# Patient Record
Sex: Female | Born: 1960 | Race: White | Hispanic: No | Marital: Married | Smoking: Never smoker
Health system: Southern US, Community
[De-identification: ages and names within clinical notes are randomized; demographics above are authoritative.]

## PROBLEM LIST (undated history)

## (undated) DIAGNOSIS — G8929 Other chronic pain: Secondary | ICD-10-CM

## (undated) DIAGNOSIS — E7800 Pure hypercholesterolemia, unspecified: Secondary | ICD-10-CM

## (undated) DIAGNOSIS — K219 Gastro-esophageal reflux disease without esophagitis: Secondary | ICD-10-CM

## (undated) DIAGNOSIS — S82899A Other fracture of unspecified lower leg, initial encounter for closed fracture: Secondary | ICD-10-CM

## (undated) DIAGNOSIS — R51 Headache: Secondary | ICD-10-CM

## (undated) DIAGNOSIS — R1310 Dysphagia, unspecified: Secondary | ICD-10-CM

## (undated) DIAGNOSIS — I609 Nontraumatic subarachnoid hemorrhage, unspecified: Secondary | ICD-10-CM

## (undated) DIAGNOSIS — F39 Unspecified mood [affective] disorder: Secondary | ICD-10-CM

## (undated) DIAGNOSIS — F329 Major depressive disorder, single episode, unspecified: Secondary | ICD-10-CM

## (undated) DIAGNOSIS — F29 Unspecified psychosis not due to a substance or known physiological condition: Secondary | ICD-10-CM

## (undated) DIAGNOSIS — Z923 Personal history of irradiation: Secondary | ICD-10-CM

## (undated) DIAGNOSIS — G40909 Epilepsy, unspecified, not intractable, without status epilepticus: Secondary | ICD-10-CM

## (undated) DIAGNOSIS — R569 Unspecified convulsions: Secondary | ICD-10-CM

## (undated) DIAGNOSIS — C719 Malignant neoplasm of brain, unspecified: Secondary | ICD-10-CM

## (undated) DIAGNOSIS — M62838 Other muscle spasm: Secondary | ICD-10-CM

## (undated) DIAGNOSIS — F32A Depression, unspecified: Secondary | ICD-10-CM

## (undated) DIAGNOSIS — F419 Anxiety disorder, unspecified: Secondary | ICD-10-CM

## (undated) HISTORY — DX: Malignant neoplasm of brain, unspecified: C71.9

## (undated) HISTORY — DX: Other fracture of unspecified lower leg, initial encounter for closed fracture: S82.899A

## (undated) HISTORY — DX: Personal history of irradiation: Z92.3

## (undated) HISTORY — DX: Unspecified convulsions: R56.9

## (undated) HISTORY — DX: Anxiety disorder, unspecified: F41.9

## (undated) HISTORY — DX: Other muscle spasm: M62.838

## (undated) HISTORY — DX: Other chronic pain: G89.29

## (undated) HISTORY — DX: Headache: R51

## (undated) HISTORY — DX: Dysphagia, unspecified: R13.10

---

## 1998-04-04 ENCOUNTER — Other Ambulatory Visit: Admission: RE | Admit: 1998-04-04 | Discharge: 1998-04-04 | Payer: Self-pay | Admitting: Obstetrics and Gynecology

## 2000-01-30 ENCOUNTER — Encounter: Admission: RE | Admit: 2000-01-30 | Discharge: 2000-01-30 | Payer: Self-pay | Admitting: Obstetrics and Gynecology

## 2000-01-30 ENCOUNTER — Encounter: Payer: Self-pay | Admitting: Obstetrics and Gynecology

## 2000-02-11 ENCOUNTER — Other Ambulatory Visit: Admission: RE | Admit: 2000-02-11 | Discharge: 2000-02-11 | Payer: Self-pay | Admitting: Obstetrics and Gynecology

## 2001-06-29 ENCOUNTER — Other Ambulatory Visit: Admission: RE | Admit: 2001-06-29 | Discharge: 2001-06-29 | Payer: Self-pay | Admitting: Obstetrics and Gynecology

## 2002-08-03 ENCOUNTER — Observation Stay (HOSPITAL_COMMUNITY): Admission: EM | Admit: 2002-08-03 | Discharge: 2002-08-04 | Payer: Self-pay | Admitting: Emergency Medicine

## 2002-08-03 ENCOUNTER — Encounter: Payer: Self-pay | Admitting: Emergency Medicine

## 2002-08-12 ENCOUNTER — Encounter: Admission: RE | Admit: 2002-08-12 | Discharge: 2002-08-12 | Payer: Self-pay | Admitting: Obstetrics and Gynecology

## 2002-08-12 ENCOUNTER — Encounter: Payer: Self-pay | Admitting: Obstetrics and Gynecology

## 2009-04-27 ENCOUNTER — Encounter: Admission: RE | Admit: 2009-04-27 | Discharge: 2009-04-27 | Payer: Self-pay | Admitting: Obstetrics and Gynecology

## 2009-09-26 ENCOUNTER — Inpatient Hospital Stay (HOSPITAL_COMMUNITY): Admission: EM | Admit: 2009-09-26 | Discharge: 2009-09-28 | Payer: Self-pay | Admitting: Emergency Medicine

## 2010-06-02 DIAGNOSIS — S82899A Other fracture of unspecified lower leg, initial encounter for closed fracture: Secondary | ICD-10-CM

## 2010-06-02 HISTORY — PX: BRAIN SURGERY: SHX531

## 2010-06-02 HISTORY — DX: Other fracture of unspecified lower leg, initial encounter for closed fracture: S82.899A

## 2010-06-23 ENCOUNTER — Encounter: Payer: Self-pay | Admitting: Obstetrics and Gynecology

## 2010-07-05 ENCOUNTER — Inpatient Hospital Stay (HOSPITAL_COMMUNITY)
Admission: AD | Admit: 2010-07-05 | Discharge: 2010-07-07 | DRG: 760 | Disposition: A | Discharge status: Home Health | Payer: PRIVATE HEALTH INSURANCE | Source: Ambulatory Visit | Attending: Obstetrics and Gynecology | Admitting: Obstetrics and Gynecology

## 2010-07-05 DIAGNOSIS — N920 Excessive and frequent menstruation with regular cycle: Principal | ICD-10-CM | POA: Diagnosis present

## 2010-07-05 DIAGNOSIS — D61818 Other pancytopenia: Secondary | ICD-10-CM | POA: Diagnosis present

## 2010-07-05 DIAGNOSIS — D500 Iron deficiency anemia secondary to blood loss (chronic): Secondary | ICD-10-CM | POA: Diagnosis present

## 2010-07-05 DIAGNOSIS — R7309 Other abnormal glucose: Secondary | ICD-10-CM | POA: Diagnosis present

## 2010-07-05 DIAGNOSIS — F411 Generalized anxiety disorder: Secondary | ICD-10-CM | POA: Diagnosis present

## 2010-07-05 DIAGNOSIS — D251 Intramural leiomyoma of uterus: Secondary | ICD-10-CM | POA: Diagnosis present

## 2010-07-05 LAB — COMPREHENSIVE METABOLIC PANEL
ALT: 72 U/L — ABNORMAL HIGH (ref 0–35)
Alkaline Phosphatase: 86 U/L (ref 39–117)
BUN: 12 mg/dL (ref 6–23)
CO2: 28 mEq/L (ref 19–32)
Chloride: 102 mEq/L (ref 96–112)
GFR calc non Af Amer: 50 mL/min — ABNORMAL LOW (ref >60)
Glucose, Bld: 225 mg/dL — ABNORMAL HIGH (ref 70–99)
Potassium: 4.4 mEq/L (ref 3.5–5.1)
Sodium: 135 mEq/L (ref 135–145)
Total Bilirubin: 1.3 mg/dL — ABNORMAL HIGH (ref 0.3–1.2)
Total Protein: 5.6 g/dL — ABNORMAL LOW (ref 6.0–8.3)

## 2010-07-05 LAB — ABO/RH: ABO/RH(D): B POS

## 2010-07-05 LAB — HEMOGLOBIN AND HEMATOCRIT, BLOOD
HCT: 19.5 % — ABNORMAL LOW (ref 36.0–46.0)
Hemoglobin: 6.8 g/dL — CL (ref 12.0–15.0)

## 2010-07-06 ENCOUNTER — Inpatient Hospital Stay (HOSPITAL_COMMUNITY): Payer: PRIVATE HEALTH INSURANCE

## 2010-07-06 LAB — APTT: aPTT: 27 seconds (ref 24–37)

## 2010-07-06 LAB — CBC
Hemoglobin: 8.3 g/dL — ABNORMAL LOW (ref 12.0–15.0)
MCV: 92.5 fL (ref 78.0–100.0)
Platelets: 68 10*3/uL — ABNORMAL LOW (ref 150–400)
RBC: 2.66 MIL/uL — ABNORMAL LOW (ref 3.87–5.11)
WBC: 2.8 10*3/uL — ABNORMAL LOW (ref 4.0–10.5)

## 2010-07-06 LAB — HEMOGLOBIN A1C
Hgb A1c MFr Bld: 4.8 % (ref <5.7)
Mean Plasma Glucose: 91 mg/dL (ref <117)

## 2010-07-06 LAB — PROTIME-INR: Prothrombin Time: 13.5 seconds (ref 11.6–15.2)

## 2010-07-07 LAB — CROSSMATCH
ABO/RH(D): B POS
Unit division: 0

## 2010-07-07 LAB — COMPREHENSIVE METABOLIC PANEL
ALT: 57 U/L — ABNORMAL HIGH (ref 0–35)
AST: 136 U/L — ABNORMAL HIGH (ref 0–37)
Albumin: 2.7 g/dL — ABNORMAL LOW (ref 3.5–5.2)
Calcium: 9.7 mg/dL (ref 8.4–10.5)
GFR calc Af Amer: >60 mL/min (ref >60)
Sodium: 138 mEq/L (ref 135–145)
Total Protein: 5.2 g/dL — ABNORMAL LOW (ref 6.0–8.3)

## 2010-07-07 LAB — CBC
MCHC: 32.9 g/dL (ref 30.0–36.0)
Platelets: 82 10*3/uL — ABNORMAL LOW (ref 150–400)
RDW: 15.9 % — ABNORMAL HIGH (ref 11.5–15.5)

## 2010-07-07 LAB — DIFFERENTIAL
Basophils Absolute: 0 10*3/uL (ref 0.0–0.1)
Basophils Relative: 0 % (ref 0–1)
Eosinophils Relative: 5 % (ref 0–5)
Monocytes Absolute: 0.3 10*3/uL (ref 0.1–1.0)

## 2010-08-15 ENCOUNTER — Emergency Department (HOSPITAL_COMMUNITY): Payer: No Typology Code available for payment source

## 2010-08-15 ENCOUNTER — Inpatient Hospital Stay (HOSPITAL_COMMUNITY)
Admission: EM | Admit: 2010-08-15 | Discharge: 2010-09-04 | DRG: 025 | Disposition: A | Discharge status: Rehabilitation Facility | Payer: No Typology Code available for payment source | Attending: Surgery | Admitting: Surgery

## 2010-08-15 DIAGNOSIS — F3289 Other specified depressive episodes: Secondary | ICD-10-CM | POA: Diagnosis present

## 2010-08-15 DIAGNOSIS — D696 Thrombocytopenia, unspecified: Secondary | ICD-10-CM | POA: Diagnosis present

## 2010-08-15 DIAGNOSIS — S82843B Displaced bimalleolar fracture of unspecified lower leg, initial encounter for open fracture type I or II: Secondary | ICD-10-CM | POA: Diagnosis present

## 2010-08-15 DIAGNOSIS — N179 Acute kidney failure, unspecified: Secondary | ICD-10-CM | POA: Diagnosis not present

## 2010-08-15 DIAGNOSIS — C713 Malignant neoplasm of parietal lobe: Principal | ICD-10-CM | POA: Diagnosis present

## 2010-08-15 DIAGNOSIS — Y998 Other external cause status: Secondary | ICD-10-CM

## 2010-08-15 DIAGNOSIS — S5010XA Contusion of unspecified forearm, initial encounter: Secondary | ICD-10-CM | POA: Diagnosis present

## 2010-08-15 DIAGNOSIS — N920 Excessive and frequent menstruation with regular cycle: Secondary | ICD-10-CM | POA: Diagnosis present

## 2010-08-15 DIAGNOSIS — E871 Hypo-osmolality and hyponatremia: Secondary | ICD-10-CM | POA: Diagnosis not present

## 2010-08-15 DIAGNOSIS — R561 Post traumatic seizures: Secondary | ICD-10-CM | POA: Diagnosis present

## 2010-08-15 DIAGNOSIS — F329 Major depressive disorder, single episode, unspecified: Secondary | ICD-10-CM | POA: Diagnosis present

## 2010-08-15 DIAGNOSIS — M25469 Effusion, unspecified knee: Secondary | ICD-10-CM | POA: Diagnosis present

## 2010-08-15 DIAGNOSIS — J9600 Acute respiratory failure, unspecified whether with hypoxia or hypercapnia: Secondary | ICD-10-CM | POA: Diagnosis present

## 2010-08-15 DIAGNOSIS — D649 Anemia, unspecified: Secondary | ICD-10-CM | POA: Diagnosis present

## 2010-08-15 DIAGNOSIS — E8779 Other fluid overload: Secondary | ICD-10-CM | POA: Diagnosis not present

## 2010-08-15 DIAGNOSIS — G936 Cerebral edema: Secondary | ICD-10-CM | POA: Diagnosis present

## 2010-08-15 DIAGNOSIS — F411 Generalized anxiety disorder: Secondary | ICD-10-CM | POA: Diagnosis present

## 2010-08-15 DIAGNOSIS — K560 Paralytic ileus: Secondary | ICD-10-CM | POA: Diagnosis not present

## 2010-08-15 DIAGNOSIS — Y92410 Unspecified street and highway as the place of occurrence of the external cause: Secondary | ICD-10-CM

## 2010-08-15 DIAGNOSIS — N390 Urinary tract infection, site not specified: Secondary | ICD-10-CM | POA: Diagnosis not present

## 2010-08-15 LAB — POCT I-STAT 3, ART BLOOD GAS (G3+)
Acid-base deficit: 9 mmol/L — ABNORMAL HIGH (ref 0.0–2.0)
O2 Saturation: 99 %
TCO2: 20 mmol/L (ref 0–100)
pO2, Arterial: 138 mmHg — ABNORMAL HIGH (ref 80.0–100.0)

## 2010-08-15 LAB — COMPREHENSIVE METABOLIC PANEL
ALT: 18 U/L (ref 0–35)
Alkaline Phosphatase: 69 U/L (ref 39–117)
BUN: 6 mg/dL (ref 6–23)
CO2: 14 mEq/L — ABNORMAL LOW (ref 19–32)
GFR calc non Af Amer: >60 mL/min (ref >60)
Glucose, Bld: 86 mg/dL (ref 70–99)
Potassium: 3.6 mEq/L (ref 3.5–5.1)
Sodium: 130 mEq/L — ABNORMAL LOW (ref 135–145)
Total Bilirubin: 0.6 mg/dL (ref 0.3–1.2)
Total Protein: 5.7 g/dL — ABNORMAL LOW (ref 6.0–8.3)

## 2010-08-15 LAB — ABO/RH: ABO/RH(D): B POS

## 2010-08-15 LAB — LACTIC ACID, PLASMA: Lactic Acid, Venous: 9.1 mmol/L — ABNORMAL HIGH (ref 0.5–2.2)

## 2010-08-15 LAB — POCT I-STAT, CHEM 8
BUN: 7 mg/dL (ref 6–23)
Creatinine, Ser: 1.1 mg/dL (ref 0.4–1.2)
Glucose, Bld: 87 mg/dL (ref 70–99)
Hemoglobin: 9.5 g/dL — ABNORMAL LOW (ref 12.0–15.0)
TCO2: 15 mmol/L (ref 0–100)

## 2010-08-15 LAB — CBC
Hemoglobin: 9.4 g/dL — ABNORMAL LOW (ref 12.0–15.0)
MCH: 30.1 pg (ref 26.0–34.0)
MCHC: 33.1 g/dL (ref 30.0–36.0)
MCV: 91 fL (ref 78.0–100.0)
Platelets: 143 10*3/uL — ABNORMAL LOW (ref 150–400)

## 2010-08-15 LAB — PROTIME-INR: Prothrombin Time: 13.8 seconds (ref 11.6–15.2)

## 2010-08-15 MED ORDER — IOHEXOL 300 MG/ML  SOLN
100.0000 mL | Freq: Once | INTRAMUSCULAR | Status: AC | PRN
  Administered 2010-08-15: 100 mL via INTRAVENOUS

## 2010-08-16 ENCOUNTER — Inpatient Hospital Stay (HOSPITAL_COMMUNITY): Payer: No Typology Code available for payment source

## 2010-08-16 LAB — BLOOD GAS, ARTERIAL
Drawn by: 33247
MECHVT: 440 mL
O2 Saturation: 99.9 %
PEEP: 5 cmH2O
RATE: 16 resp/min
pO2, Arterial: 230 mmHg — ABNORMAL HIGH (ref 80.0–100.0)

## 2010-08-16 LAB — BASIC METABOLIC PANEL
CO2: 26 mEq/L (ref 19–32)
Calcium: 7.5 mg/dL — ABNORMAL LOW (ref 8.4–10.5)
Chloride: 105 mEq/L (ref 96–112)
Creatinine, Ser: 1.17 mg/dL (ref 0.4–1.2)
Glucose, Bld: 137 mg/dL — ABNORMAL HIGH (ref 70–99)
Sodium: 137 mEq/L (ref 135–145)

## 2010-08-16 LAB — CBC
HCT: 24.6 % — ABNORMAL LOW (ref 36.0–46.0)
Hemoglobin: 8.4 g/dL — ABNORMAL LOW (ref 12.0–15.0)
MCH: 31.1 pg (ref 26.0–34.0)
MCHC: 34.1 g/dL (ref 30.0–36.0)

## 2010-08-16 MED ORDER — GADOBENATE DIMEGLUMINE 529 MG/ML IV SOLN
20.0000 mL | Freq: Once | INTRAVENOUS | Status: AC
  Administered 2010-08-16: 20 mL via INTRAVENOUS

## 2010-08-17 LAB — BASIC METABOLIC PANEL
BUN: 13 mg/dL (ref 6–23)
Calcium: 6.5 mg/dL — ABNORMAL LOW (ref 8.4–10.5)
Creatinine, Ser: 1.77 mg/dL — ABNORMAL HIGH (ref 0.4–1.2)
GFR calc non Af Amer: 30 mL/min — ABNORMAL LOW (ref >60)
Glucose, Bld: 173 mg/dL — ABNORMAL HIGH (ref 70–99)

## 2010-08-17 LAB — DIFFERENTIAL
Basophils Absolute: 0 10*3/uL (ref 0.0–0.1)
Basophils Relative: 0 % (ref 0–1)
Eosinophils Absolute: 0.1 10*3/uL (ref 0.0–0.7)
Eosinophils Relative: 1 % (ref 0–5)
Monocytes Absolute: 0.5 10*3/uL (ref 0.1–1.0)
Neutro Abs: 3.6 10*3/uL (ref 1.7–7.7)

## 2010-08-17 LAB — CBC
Hemoglobin: 6.3 g/dL — CL (ref 12.0–15.0)
MCH: 30.4 pg (ref 26.0–34.0)
MCHC: 32.5 g/dL (ref 30.0–36.0)
Platelets: 76 10*3/uL — ABNORMAL LOW (ref 150–400)
RDW: 13.6 % (ref 11.5–15.5)

## 2010-08-17 LAB — URINALYSIS, ROUTINE W REFLEX MICROSCOPIC
Glucose, UA: NEGATIVE mg/dL
Ketones, ur: 15 mg/dL — AB
Nitrite: NEGATIVE
Specific Gravity, Urine: 1.026 (ref 1.005–1.030)
pH: 5.5 (ref 5.0–8.0)

## 2010-08-17 LAB — URINE MICROSCOPIC-ADD ON

## 2010-08-17 LAB — PREPARE RBC (CROSSMATCH)

## 2010-08-17 LAB — PHENYTOIN LEVEL, TOTAL: Phenytoin Lvl: 10.6 ug/mL (ref 10.0–20.0)

## 2010-08-17 LAB — CK: Total CK: 220 U/L — ABNORMAL HIGH (ref 7–177)

## 2010-08-18 ENCOUNTER — Inpatient Hospital Stay (HOSPITAL_COMMUNITY): Payer: No Typology Code available for payment source

## 2010-08-18 LAB — TYPE AND SCREEN
ABO/RH(D): B POS
Antibody Screen: NEGATIVE
Unit division: 0
Unit division: 0

## 2010-08-18 LAB — BASIC METABOLIC PANEL
BUN: 12 mg/dL (ref 6–23)
Chloride: 108 mEq/L (ref 96–112)
Creatinine, Ser: 1.43 mg/dL — ABNORMAL HIGH (ref 0.4–1.2)
GFR calc Af Amer: 47 mL/min — ABNORMAL LOW (ref >60)
GFR calc non Af Amer: 39 mL/min — ABNORMAL LOW (ref >60)

## 2010-08-18 LAB — CBC
MCH: 31 pg (ref 26.0–34.0)
MCV: 90.7 fL (ref 78.0–100.0)
Platelets: 78 10*3/uL — ABNORMAL LOW (ref 150–400)
RBC: 2.58 MIL/uL — ABNORMAL LOW (ref 3.87–5.11)
RDW: 14.6 % (ref 11.5–15.5)
WBC: 3.3 10*3/uL — ABNORMAL LOW (ref 4.0–10.5)

## 2010-08-19 LAB — BASIC METABOLIC PANEL
BUN: 10 mg/dL (ref 6–23)
Calcium: 6.7 mg/dL — ABNORMAL LOW (ref 8.4–10.5)
Chloride: 109 mEq/L (ref 96–112)
Creatinine, Ser: 1.18 mg/dL (ref 0.4–1.2)

## 2010-08-19 LAB — CBC
Platelets: 103 10*3/uL — ABNORMAL LOW (ref 150–400)
RBC: 2.61 MIL/uL — ABNORMAL LOW (ref 3.87–5.11)
RDW: 14.4 % (ref 11.5–15.5)
WBC: 5.3 10*3/uL (ref 4.0–10.5)

## 2010-08-20 ENCOUNTER — Inpatient Hospital Stay (HOSPITAL_COMMUNITY): Payer: No Typology Code available for payment source

## 2010-08-20 LAB — BASIC METABOLIC PANEL
BUN: 9 mg/dL (ref 6–23)
BUN: 11 mg/dL (ref 6–23)
CO2: 26 mEq/L (ref 19–32)
Chloride: 113 mEq/L — ABNORMAL HIGH (ref 96–112)
Chloride: 113 mEq/L — ABNORMAL HIGH (ref 96–112)
Creatinine, Ser: 1.35 mg/dL — ABNORMAL HIGH (ref 0.4–1.2)
Creatinine, Ser: 1.26 mg/dL — ABNORMAL HIGH (ref 0.4–1.2)
GFR calc Af Amer: 50 mL/min — ABNORMAL LOW (ref >60)
GFR calc Af Amer: 48 mL/min — ABNORMAL LOW (ref >60)
GFR calc Af Amer: 54 mL/min — ABNORMAL LOW (ref >60)
GFR calc non Af Amer: 41 mL/min — ABNORMAL LOW (ref >60)
Glucose, Bld: 91 mg/dL (ref 70–99)
Potassium: 3.1 mEq/L — ABNORMAL LOW (ref 3.5–5.1)
Potassium: 4.5 mEq/L (ref 3.5–5.1)

## 2010-08-20 LAB — POCT I-STAT, CHEM 8
Creatinine, Ser: 2.3 mg/dL — ABNORMAL HIGH (ref 0.4–1.2)
Glucose, Bld: 103 mg/dL — ABNORMAL HIGH (ref 70–99)
Hemoglobin: 6.1 g/dL — CL (ref 12.0–15.0)
Potassium: 3.8 mEq/L (ref 3.5–5.1)

## 2010-08-20 LAB — POCT PREGNANCY, URINE: Preg Test, Ur: NEGATIVE

## 2010-08-20 LAB — COMPREHENSIVE METABOLIC PANEL
ALT: 33 U/L (ref 0–35)
AST: 58 U/L — ABNORMAL HIGH (ref 0–37)
CO2: 25 mEq/L (ref 19–32)
Calcium: 8.7 mg/dL (ref 8.4–10.5)
Creatinine, Ser: 1.65 mg/dL — ABNORMAL HIGH (ref 0.4–1.2)
GFR calc Af Amer: 40 mL/min — ABNORMAL LOW (ref >60)
GFR calc non Af Amer: 33 mL/min — ABNORMAL LOW (ref >60)
Sodium: 135 mEq/L (ref 135–145)
Total Protein: 5 g/dL — ABNORMAL LOW (ref 6.0–8.3)

## 2010-08-20 LAB — CBC
HCT: 19 % — ABNORMAL LOW (ref 36.0–46.0)
HCT: 22.4 % — ABNORMAL LOW (ref 36.0–46.0)
Hemoglobin: 7.8 g/dL — ABNORMAL LOW (ref 12.0–15.0)
MCHC: 34.8 g/dL (ref 30.0–36.0)
MCHC: 35.4 g/dL (ref 30.0–36.0)
MCV: 90.6 fL (ref 78.0–100.0)
MCV: 91.7 fL (ref 78.0–100.0)
MCV: 92.7 fL (ref 78.0–100.0)
MCV: 92.8 fL (ref 78.0–100.0)
MCV: 94.1 fL (ref 78.0–100.0)
Platelets: 144 10*3/uL — ABNORMAL LOW (ref 150–400)
Platelets: 115 10*3/uL — ABNORMAL LOW (ref 150–400)
Platelets: 130 10*3/uL — ABNORMAL LOW (ref 150–400)
Platelets: 97 10*3/uL — ABNORMAL LOW (ref 150–400)
RBC: 2.42 MIL/uL — ABNORMAL LOW (ref 3.87–5.11)
RBC: 2.44 MIL/uL — ABNORMAL LOW (ref 3.87–5.11)
RBC: 2.46 MIL/uL — ABNORMAL LOW (ref 3.87–5.11)
RBC: 2.6 MIL/uL — ABNORMAL LOW (ref 3.87–5.11)
RDW: 14.5 % (ref 11.5–15.5)
RDW: 13.8 % (ref 11.5–15.5)
RDW: 14.4 % (ref 11.5–15.5)
WBC: 5.6 10*3/uL (ref 4.0–10.5)
WBC: 3.3 10*3/uL — ABNORMAL LOW (ref 4.0–10.5)
WBC: 3.4 10*3/uL — ABNORMAL LOW (ref 4.0–10.5)
WBC: 5.1 10*3/uL (ref 4.0–10.5)

## 2010-08-20 LAB — MAGNESIUM: Magnesium: 2.4 mg/dL (ref 1.5–2.5)

## 2010-08-20 LAB — URINE CULTURE: Colony Count: 50000

## 2010-08-20 LAB — DIFFERENTIAL
Eosinophils Absolute: 0.1 10*3/uL (ref 0.0–0.7)
Eosinophils Relative: 2 % (ref 0–5)
Lymphs Abs: 1.2 10*3/uL (ref 0.7–4.0)

## 2010-08-20 LAB — TYPE AND SCREEN: Antibody Screen: NEGATIVE

## 2010-08-20 LAB — SODIUM, URINE, RANDOM: Sodium, Ur: 19 mEq/L

## 2010-08-20 LAB — URINALYSIS, ROUTINE W REFLEX MICROSCOPIC
Glucose, UA: NEGATIVE mg/dL
Ketones, ur: NEGATIVE mg/dL
Leukocytes, UA: NEGATIVE
Protein, ur: NEGATIVE mg/dL
Urobilinogen, UA: 0.2 mg/dL (ref 0.0–1.0)

## 2010-08-20 LAB — URINE MICROSCOPIC-ADD ON

## 2010-08-20 LAB — ABO/RH: ABO/RH(D): B POS

## 2010-08-20 LAB — IRON AND TIBC: Iron: 51 ug/dL (ref 42–135)

## 2010-08-20 LAB — VITAMIN B12: Vitamin B-12: 573 pg/mL (ref 211–911)

## 2010-08-20 LAB — PROTIME-INR: Prothrombin Time: 13.8 seconds (ref 11.6–15.2)

## 2010-08-21 LAB — BASIC METABOLIC PANEL
Calcium: 7.5 mg/dL — ABNORMAL LOW (ref 8.4–10.5)
Creatinine, Ser: 1.38 mg/dL — ABNORMAL HIGH (ref 0.4–1.2)
GFR calc Af Amer: 49 mL/min — ABNORMAL LOW (ref >60)

## 2010-08-21 LAB — SURGICAL PCR SCREEN
MRSA, PCR: NEGATIVE
Staphylococcus aureus: NEGATIVE

## 2010-08-22 ENCOUNTER — Inpatient Hospital Stay (HOSPITAL_COMMUNITY): Payer: No Typology Code available for payment source

## 2010-08-22 HISTORY — PX: ORIF ANKLE FRACTURE: SUR919

## 2010-08-22 LAB — DIFFERENTIAL
Basophils Absolute: 0 10*3/uL (ref 0.0–0.1)
Basophils Relative: 1 % (ref 0–1)
Eosinophils Absolute: 0.1 10*3/uL (ref 0.0–0.7)
Eosinophils Relative: 3 % (ref 0–5)

## 2010-08-22 LAB — BASIC METABOLIC PANEL
BUN: 12 mg/dL (ref 6–23)
GFR calc non Af Amer: 49 mL/min — ABNORMAL LOW (ref >60)
Potassium: 3.5 mEq/L (ref 3.5–5.1)
Sodium: 145 mEq/L (ref 135–145)

## 2010-08-22 LAB — CBC
Platelets: 141 10*3/uL — ABNORMAL LOW (ref 150–400)
RDW: 14.9 % (ref 11.5–15.5)
WBC: 3.7 10*3/uL — ABNORMAL LOW (ref 4.0–10.5)

## 2010-08-22 LAB — PHENYTOIN LEVEL, TOTAL: Phenytoin Lvl: 10.7 ug/mL (ref 10.0–20.0)

## 2010-08-23 DIAGNOSIS — M79609 Pain in unspecified limb: Secondary | ICD-10-CM

## 2010-08-23 LAB — BASIC METABOLIC PANEL
Chloride: 116 mEq/L — ABNORMAL HIGH (ref 96–112)
Creatinine, Ser: 1.31 mg/dL — ABNORMAL HIGH (ref 0.4–1.2)
GFR calc Af Amer: 52 mL/min — ABNORMAL LOW (ref >60)
GFR calc non Af Amer: 43 mL/min — ABNORMAL LOW (ref >60)

## 2010-08-23 LAB — CBC
Hemoglobin: 7.3 g/dL — ABNORMAL LOW (ref 12.0–15.0)
MCHC: 33 g/dL (ref 30.0–36.0)
RBC: 2.4 MIL/uL — ABNORMAL LOW (ref 3.87–5.11)

## 2010-08-23 LAB — PHENYTOIN LEVEL, TOTAL: Phenytoin Lvl: 8.8 ug/mL — ABNORMAL LOW (ref 10.0–20.0)

## 2010-08-23 LAB — ALBUMIN: Albumin: 2.2 g/dL — ABNORMAL LOW (ref 3.5–5.2)
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
## 2010-08-24 LAB — CBC
HCT: 22.8 % — ABNORMAL LOW (ref 36.0–46.0)
MCH: 29.8 pg (ref 26.0–34.0)
MCHC: 32.5 g/dL (ref 30.0–36.0)
MCV: 91.9 fL (ref 78.0–100.0)
RDW: 14.7 % (ref 11.5–15.5)

## 2010-08-24 LAB — BASIC METABOLIC PANEL
BUN: 11 mg/dL (ref 6–23)
CO2: 17 mEq/L — ABNORMAL LOW (ref 19–32)
Calcium: 7.8 mg/dL — ABNORMAL LOW (ref 8.4–10.5)
GFR calc non Af Amer: 49 mL/min — ABNORMAL LOW (ref >60)
Glucose, Bld: 97 mg/dL (ref 70–99)

## 2010-08-26 ENCOUNTER — Inpatient Hospital Stay (HOSPITAL_COMMUNITY): Payer: No Typology Code available for payment source

## 2010-08-26 DIAGNOSIS — S82853A Displaced trimalleolar fracture of unspecified lower leg, initial encounter for closed fracture: Secondary | ICD-10-CM

## 2010-08-26 MED ORDER — GADOBENATE DIMEGLUMINE 529 MG/ML IV SOLN
15.0000 mL | Freq: Once | INTRAVENOUS | Status: AC
  Administered 2010-08-26: 15 mL via INTRAVENOUS
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
## 2010-08-27 ENCOUNTER — Inpatient Hospital Stay (HOSPITAL_COMMUNITY): Payer: No Typology Code available for payment source

## 2010-08-27 LAB — URINE MICROSCOPIC-ADD ON

## 2010-08-27 LAB — CROSSMATCH
ABO/RH(D): B POS
Unit division: 0

## 2010-08-27 LAB — URINALYSIS, ROUTINE W REFLEX MICROSCOPIC
Bilirubin Urine: NEGATIVE
Glucose, UA: NEGATIVE mg/dL
Ketones, ur: NEGATIVE mg/dL
Specific Gravity, Urine: 1.013 (ref 1.005–1.030)
pH: 5.5 (ref 5.0–8.0)

## 2010-08-27 MED ORDER — GADOBENATE DIMEGLUMINE 529 MG/ML IV SOLN
15.0000 mL | Freq: Once | INTRAVENOUS | Status: AC | PRN
  Administered 2010-08-27: 15 mL via INTRAVENOUS

## 2010-08-28 ENCOUNTER — Inpatient Hospital Stay (HOSPITAL_COMMUNITY): Payer: No Typology Code available for payment source

## 2010-08-28 ENCOUNTER — Other Ambulatory Visit: Payer: Self-pay | Admitting: Neurosurgery

## 2010-08-28 LAB — CBC
Platelets: 295 10*3/uL (ref 150–400)
RBC: 2.45 MIL/uL — ABNORMAL LOW (ref 3.87–5.11)
RDW: 14.4 % (ref 11.5–15.5)
WBC: 4.9 10*3/uL (ref 4.0–10.5)

## 2010-08-28 LAB — BASIC METABOLIC PANEL
GFR calc non Af Amer: >60 mL/min (ref >60)
Potassium: 3.6 mEq/L (ref 3.5–5.1)
Sodium: 136 mEq/L (ref 135–145)

## 2010-08-28 LAB — PROTIME-INR: Prothrombin Time: 13.6 seconds (ref 11.6–15.2)

## 2010-08-28 LAB — URINE CULTURE: Colony Count: 50000

## 2010-08-28 LAB — APTT: aPTT: 23 seconds — ABNORMAL LOW (ref 24–37)

## 2010-08-29 LAB — CROSSMATCH
ABO/RH(D): B POS
Antibody Screen: NEGATIVE
Unit division: 0

## 2010-08-29 LAB — COMPREHENSIVE METABOLIC PANEL
Albumin: 1.8 g/dL — ABNORMAL LOW (ref 3.5–5.2)
Alkaline Phosphatase: 102 U/L (ref 39–117)
BUN: 10 mg/dL (ref 6–23)
Chloride: 108 mEq/L (ref 96–112)
Potassium: 4.2 mEq/L (ref 3.5–5.1)
Total Bilirubin: 0.6 mg/dL (ref 0.3–1.2)

## 2010-08-29 LAB — CBC
HCT: 27.5 % — ABNORMAL LOW (ref 36.0–46.0)
MCV: 85.9 fL (ref 78.0–100.0)
Platelets: 361 10*3/uL (ref 150–400)
RBC: 3.2 MIL/uL — ABNORMAL LOW (ref 3.87–5.11)
WBC: 6.9 10*3/uL (ref 4.0–10.5)

## 2010-08-30 ENCOUNTER — Inpatient Hospital Stay (HOSPITAL_COMMUNITY): Payer: No Typology Code available for payment source

## 2010-08-30 LAB — CBC
MCH: 29 pg (ref 26.0–34.0)
MCHC: 33.3 g/dL (ref 30.0–36.0)
MCV: 86.9 fL (ref 78.0–100.0)
Platelets: 322 10*3/uL (ref 150–400)
RDW: 16.1 % — ABNORMAL HIGH (ref 11.5–15.5)
WBC: 9.7 10*3/uL (ref 4.0–10.5)

## 2010-08-30 LAB — URINALYSIS, MICROSCOPIC ONLY
Ketones, ur: NEGATIVE mg/dL
Nitrite: NEGATIVE
Protein, ur: 30 mg/dL — AB

## 2010-08-30 LAB — BASIC METABOLIC PANEL
BUN: 12 mg/dL (ref 6–23)
Calcium: 8 mg/dL — ABNORMAL LOW (ref 8.4–10.5)
Creatinine, Ser: 1.2 mg/dL (ref 0.4–1.2)
GFR calc non Af Amer: 47 mL/min — ABNORMAL LOW (ref >60)
Potassium: 4.2 mEq/L (ref 3.5–5.1)

## 2010-08-31 LAB — CBC
Hemoglobin: 9.1 g/dL — ABNORMAL LOW (ref 12.0–15.0)
MCH: 28.8 pg (ref 26.0–34.0)
MCV: 86.4 fL (ref 78.0–100.0)
RBC: 3.16 MIL/uL — ABNORMAL LOW (ref 3.87–5.11)
WBC: 8.1 10*3/uL (ref 4.0–10.5)

## 2010-08-31 LAB — COMPREHENSIVE METABOLIC PANEL
AST: 31 U/L (ref 0–37)
Alkaline Phosphatase: 100 U/L (ref 39–117)
BUN: 14 mg/dL (ref 6–23)
CO2: 22 mEq/L (ref 19–32)
Chloride: 105 mEq/L (ref 96–112)
Creatinine, Ser: 0.98 mg/dL (ref 0.4–1.2)
GFR calc Af Amer: >60 mL/min (ref >60)
GFR calc non Af Amer: 60 mL/min — ABNORMAL LOW (ref >60)
Potassium: 4.3 mEq/L (ref 3.5–5.1)
Total Bilirubin: 0.3 mg/dL (ref 0.3–1.2)

## 2010-08-31 LAB — URINE CULTURE
Colony Count: >=100000
Culture  Setup Time: 201203301115

## 2010-08-31 LAB — PHOSPHORUS: Phosphorus: 2.1 mg/dL — ABNORMAL LOW (ref 2.3–4.6)

## 2010-09-01 LAB — BASIC METABOLIC PANEL
CO2: 25 mEq/L (ref 19–32)
Calcium: 8.2 mg/dL — ABNORMAL LOW (ref 8.4–10.5)
Creatinine, Ser: 1 mg/dL (ref 0.4–1.2)
GFR calc Af Amer: >60 mL/min (ref >60)
GFR calc non Af Amer: 58 mL/min — ABNORMAL LOW (ref >60)
Sodium: 136 mEq/L (ref 135–145)

## 2010-09-01 LAB — MAGNESIUM: Magnesium: 1.2 mg/dL — ABNORMAL LOW (ref 1.5–2.5)

## 2010-09-01 LAB — PHOSPHORUS: Phosphorus: 2.1 mg/dL — ABNORMAL LOW (ref 2.3–4.6)

## 2010-09-02 LAB — BASIC METABOLIC PANEL
BUN: 16 mg/dL (ref 6–23)
Calcium: 8.3 mg/dL — ABNORMAL LOW (ref 8.4–10.5)
Creatinine, Ser: 0.92 mg/dL (ref 0.4–1.2)
GFR calc Af Amer: >60 mL/min (ref >60)
GFR calc non Af Amer: >60 mL/min (ref >60)
Glucose, Bld: 128 mg/dL — ABNORMAL HIGH (ref 70–99)
Potassium: 3.8 mEq/L (ref 3.5–5.1)
Sodium: 133 mEq/L — ABNORMAL LOW (ref 135–145)

## 2010-09-02 LAB — CBC
HCT: 32.6 % — ABNORMAL LOW (ref 36.0–46.0)
MCHC: 32.8 g/dL (ref 30.0–36.0)
Platelets: 440 10*3/uL — ABNORMAL HIGH (ref 150–400)
RDW: 15.2 % (ref 11.5–15.5)
WBC: 8 10*3/uL (ref 4.0–10.5)

## 2010-09-04 ENCOUNTER — Inpatient Hospital Stay (HOSPITAL_COMMUNITY)
Admission: RE | Admit: 2010-09-04 | Discharge: 2010-09-16 | DRG: 945 | Disposition: A | Discharge status: Home Health | Payer: PRIVATE HEALTH INSURANCE | Source: Other Acute Inpatient Hospital | Attending: Physical Medicine & Rehabilitation | Admitting: Physical Medicine & Rehabilitation

## 2010-09-04 DIAGNOSIS — Z87891 Personal history of nicotine dependence: Secondary | ICD-10-CM

## 2010-09-04 DIAGNOSIS — F411 Generalized anxiety disorder: Secondary | ICD-10-CM | POA: Diagnosis present

## 2010-09-04 DIAGNOSIS — D62 Acute posthemorrhagic anemia: Secondary | ICD-10-CM | POA: Diagnosis present

## 2010-09-04 DIAGNOSIS — S82899A Other fracture of unspecified lower leg, initial encounter for closed fracture: Secondary | ICD-10-CM | POA: Diagnosis present

## 2010-09-04 DIAGNOSIS — G40909 Epilepsy, unspecified, not intractable, without status epilepticus: Secondary | ICD-10-CM | POA: Diagnosis present

## 2010-09-04 DIAGNOSIS — Z5189 Encounter for other specified aftercare: Principal | ICD-10-CM

## 2010-09-04 DIAGNOSIS — E871 Hypo-osmolality and hyponatremia: Secondary | ICD-10-CM | POA: Diagnosis present

## 2010-09-04 DIAGNOSIS — C713 Malignant neoplasm of parietal lobe: Secondary | ICD-10-CM | POA: Diagnosis present

## 2010-09-04 DIAGNOSIS — C719 Malignant neoplasm of brain, unspecified: Secondary | ICD-10-CM

## 2010-09-04 DIAGNOSIS — S82843A Displaced bimalleolar fracture of unspecified lower leg, initial encounter for closed fracture: Secondary | ICD-10-CM

## 2010-09-05 LAB — CBC
MCH: 28.2 pg (ref 26.0–34.0)
MCV: 89.3 fL (ref 78.0–100.0)
Platelets: 302 10*3/uL (ref 150–400)
RDW: 15.2 % (ref 11.5–15.5)
WBC: 7 10*3/uL (ref 4.0–10.5)

## 2010-09-05 LAB — DIFFERENTIAL
Eosinophils Absolute: 0.1 10*3/uL (ref 0.0–0.7)
Eosinophils Relative: 2 % (ref 0–5)
Lymphs Abs: 1.1 10*3/uL (ref 0.7–4.0)

## 2010-09-05 LAB — COMPREHENSIVE METABOLIC PANEL
Albumin: 1.9 g/dL — ABNORMAL LOW (ref 3.5–5.2)
BUN: 20 mg/dL (ref 6–23)
Calcium: 8.5 mg/dL (ref 8.4–10.5)
Creatinine, Ser: 0.93 mg/dL (ref 0.4–1.2)
Potassium: 3.8 mEq/L (ref 3.5–5.1)
Total Protein: 4.5 g/dL — ABNORMAL LOW (ref 6.0–8.3)
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
## 2010-09-06 DIAGNOSIS — C719 Malignant neoplasm of brain, unspecified: Secondary | ICD-10-CM

## 2010-09-06 DIAGNOSIS — S82843A Displaced bimalleolar fracture of unspecified lower leg, initial encounter for closed fracture: Secondary | ICD-10-CM
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
## 2010-09-11 DIAGNOSIS — S82843A Displaced bimalleolar fracture of unspecified lower leg, initial encounter for closed fracture: Secondary | ICD-10-CM

## 2010-09-11 DIAGNOSIS — C719 Malignant neoplasm of brain, unspecified: Secondary | ICD-10-CM

## 2010-09-12 ENCOUNTER — Inpatient Hospital Stay (HOSPITAL_COMMUNITY): Payer: PRIVATE HEALTH INSURANCE

## 2010-09-12 DIAGNOSIS — C719 Malignant neoplasm of brain, unspecified: Secondary | ICD-10-CM

## 2010-09-12 DIAGNOSIS — F4322 Adjustment disorder with anxiety: Secondary | ICD-10-CM

## 2010-09-12 LAB — CBC
HCT: 31.2 % — ABNORMAL LOW (ref 36.0–46.0)
Hemoglobin: 9.9 g/dL — ABNORMAL LOW (ref 12.0–15.0)
MCV: 89.1 fL (ref 78.0–100.0)
RBC: 3.5 MIL/uL — ABNORMAL LOW (ref 3.87–5.11)
WBC: 5.1 10*3/uL (ref 4.0–10.5)

## 2010-09-12 LAB — BASIC METABOLIC PANEL
BUN: 22 mg/dL (ref 6–23)
Chloride: 100 mEq/L (ref 96–112)
Glucose, Bld: 90 mg/dL (ref 70–99)
Potassium: 3.9 mEq/L (ref 3.5–5.1)

## 2010-09-12 MED ORDER — GADOBENATE DIMEGLUMINE 529 MG/ML IV SOLN
20.0000 mL | Freq: Once | INTRAVENOUS | Status: AC
  Administered 2010-09-12: 20 mL via INTRAVENOUS
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
## 2010-09-13 DIAGNOSIS — C719 Malignant neoplasm of brain, unspecified: Secondary | ICD-10-CM

## 2010-09-13 DIAGNOSIS — S82843A Displaced bimalleolar fracture of unspecified lower leg, initial encounter for closed fracture: Secondary | ICD-10-CM

## 2010-09-16 ENCOUNTER — Ambulatory Visit: Payer: PRIVATE HEALTH INSURANCE | Attending: Radiation Oncology | Admitting: Radiation Oncology

## 2010-09-16 DIAGNOSIS — R51 Headache: Secondary | ICD-10-CM | POA: Insufficient documentation

## 2010-09-16 DIAGNOSIS — C713 Malignant neoplasm of parietal lobe: Secondary | ICD-10-CM | POA: Insufficient documentation

## 2010-09-16 DIAGNOSIS — Z510 Encounter for antineoplastic radiation therapy: Secondary | ICD-10-CM | POA: Insufficient documentation
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
## 2010-10-17 ENCOUNTER — Ambulatory Visit: Payer: PRIVATE HEALTH INSURANCE | Attending: Orthopedic Surgery

## 2010-10-17 DIAGNOSIS — M25673 Stiffness of unspecified ankle, not elsewhere classified: Secondary | ICD-10-CM | POA: Insufficient documentation

## 2010-10-17 DIAGNOSIS — IMO0001 Reserved for inherently not codable concepts without codable children: Secondary | ICD-10-CM | POA: Insufficient documentation

## 2010-10-17 DIAGNOSIS — M25579 Pain in unspecified ankle and joints of unspecified foot: Secondary | ICD-10-CM | POA: Insufficient documentation

## 2010-10-17 DIAGNOSIS — M6281 Muscle weakness (generalized): Secondary | ICD-10-CM | POA: Insufficient documentation

## 2010-10-17 DIAGNOSIS — M25676 Stiffness of unspecified foot, not elsewhere classified: Secondary | ICD-10-CM | POA: Insufficient documentation

## 2010-10-17 DIAGNOSIS — R269 Unspecified abnormalities of gait and mobility: Secondary | ICD-10-CM | POA: Insufficient documentation

## 2010-10-18 ENCOUNTER — Other Ambulatory Visit: Payer: Self-pay | Admitting: Oncology

## 2010-10-18 ENCOUNTER — Encounter: Payer: No Typology Code available for payment source | Admitting: Oncology

## 2010-10-18 ENCOUNTER — Encounter (HOSPITAL_BASED_OUTPATIENT_CLINIC_OR_DEPARTMENT_OTHER): Payer: PRIVATE HEALTH INSURANCE | Admitting: Oncology

## 2010-10-18 DIAGNOSIS — D496 Neoplasm of unspecified behavior of brain: Secondary | ICD-10-CM

## 2010-10-18 DIAGNOSIS — C713 Malignant neoplasm of parietal lobe: Secondary | ICD-10-CM

## 2010-10-18 LAB — CBC WITH DIFFERENTIAL/PLATELET
Eosinophils Absolute: 0.1 10*3/uL (ref 0.0–0.5)
HCT: 35.9 % (ref 34.8–46.6)
HGB: 12.1 g/dL (ref 11.6–15.9)
LYMPH%: 35.4 % (ref 14.0–49.7)
MONO#: 0.4 10*3/uL (ref 0.1–0.9)
NEUT#: 1.8 10*3/uL (ref 1.5–6.5)
Platelets: 148 10*3/uL (ref 145–400)
RBC: 4.21 10*6/uL (ref 3.70–5.45)
WBC: 3.6 10*3/uL — ABNORMAL LOW (ref 3.9–10.3)

## 2010-10-18 LAB — COMPREHENSIVE METABOLIC PANEL
Albumin: 3.4 g/dL — ABNORMAL LOW (ref 3.5–5.2)
CO2: 26 mEq/L (ref 19–32)
Glucose, Bld: 87 mg/dL (ref 70–99)
Sodium: 139 mEq/L (ref 135–145)
Total Bilirubin: 0.4 mg/dL (ref 0.3–1.2)
Total Protein: 5.7 g/dL — ABNORMAL LOW (ref 6.0–8.3)

## 2010-10-18 NOTE — Discharge Summary (Signed)
NAME:  [name], [name]                          ACCOUNT NO.:  [id number]   MEDICAL RECORD NO.:  [id number]                   PATIENT TYPE:  INP   LOCATION:  3715                                 FACILITY:  [facility]   PHYSICIAN:  Ermalene Searing. Leander Rams, M.D.                DATE OF BIRTH:  25-Apr-1961   DATE OF ADMISSION:  08/03/2002  DATE OF DISCHARGE:  08/04/2002                                 DISCHARGE SUMMARY   DISCHARGE MEDICATION:  Ativan 0.5 to 1 mg p.o. q.8h. p.r.n.   ALLERGIES:  NKDA.   PROCEDURES:  None.   HISTORY OF PRESENT ILLNESS:  The patient was in the usual state of health  when she began to have left-sided chest pain approximately four days prior  to presentation and denies shortness of breath, nausea or diaphoresis.  The  pain is worse with activity, better with rest.  She had similar symptoms  approximately six months ago associated with anxiety.  She saw primary MD  today who sent her to ER for further work-up.  In the ER, the patient's  blood pressure was found to be elevated 159/101 with a pulse of 89.  Her  first set of cardiac enzymes was negative and her ECG revealed normal sinus  rhythm with a right-sided bundle branch block.  She was admitted to rule out  MI.   HOSPITAL COURSE:  The patient was admitted to telemetry unit for monitoring.  She stayed in normal sinus rhythm with right bundle branch block throughout  her stay.  She did not have any significant ectopy nor ST wave changes.  Two  sets of cardiac enzymes were completed and found to be negative.  The  patient's blood pressure improved overnight and at the time of discharge,  blood pressure is 142/82, heart rate is 68, respirations 15.  Room air  saturations are 98%.  She is free of chest pain or shortness of breath.  It  is thought at this time that her chest pain was secondary to anxiety.  Hence, she is discharged on Ativan as noted above.  Followup is as noted  below.   DISCHARGE LABORATORY DATA:  Sodium  138, potassium 4.1, BUN 21, creatinine  0.9, serum glucose 96.  White blood cell count 3.5, hemoglobin 11.6,  hematocrit 33.1, platelet count 167.000.   CONSULTATIONS:  None.   CONDITION ON DISCHARGE:  Good.   DISPOSITION:  Discharged to home.    FOLLOWUP:  Followup is with [facility] for outpatient treadmill  Cardiolite on Thursday, August 11, 2002, at 12 noon.  She is to follow up  with her primary MD for any other concerns that she may have.      Ellender Hose. Sharol Given. Leander Rams, M.D.    SMD/MEDQ  D:  08/04/2002  T:  08/04/2002  Job:  [id number]   cc:   [facility]   Vikki Ports, M.D.  [address]  [employer]  [place] [address]  Fax: [phone]

## 2010-10-21 ENCOUNTER — Ambulatory Visit: Payer: PRIVATE HEALTH INSURANCE | Admitting: Physical Therapy

## 2010-10-23 ENCOUNTER — Ambulatory Visit: Payer: PRIVATE HEALTH INSURANCE | Admitting: Physical Therapy

## 2010-10-29 ENCOUNTER — Encounter
Payer: PRIVATE HEALTH INSURANCE | Attending: Physical Medicine & Rehabilitation | Admitting: Physical Medicine & Rehabilitation

## 2010-10-29 DIAGNOSIS — M25579 Pain in unspecified ankle and joints of unspecified foot: Secondary | ICD-10-CM | POA: Insufficient documentation

## 2010-10-29 DIAGNOSIS — C719 Malignant neoplasm of brain, unspecified: Secondary | ICD-10-CM

## 2010-10-29 DIAGNOSIS — R110 Nausea: Secondary | ICD-10-CM | POA: Insufficient documentation

## 2010-10-29 DIAGNOSIS — R509 Fever, unspecified: Secondary | ICD-10-CM | POA: Insufficient documentation

## 2010-10-29 DIAGNOSIS — F32A Depression, unspecified: Secondary | ICD-10-CM

## 2010-10-29 DIAGNOSIS — R413 Other amnesia: Secondary | ICD-10-CM | POA: Insufficient documentation

## 2010-10-29 DIAGNOSIS — S82843A Displaced bimalleolar fracture of unspecified lower leg, initial encounter for closed fracture: Secondary | ICD-10-CM

## 2010-10-29 DIAGNOSIS — R262 Difficulty in walking, not elsewhere classified: Secondary | ICD-10-CM | POA: Insufficient documentation

## 2010-10-29 DIAGNOSIS — C711 Malignant neoplasm of frontal lobe: Secondary | ICD-10-CM | POA: Insufficient documentation

## 2010-10-29 DIAGNOSIS — F323 Major depressive disorder, single episode, severe with psychotic features: Secondary | ICD-10-CM

## 2010-10-29 DIAGNOSIS — X58XXXA Exposure to other specified factors, initial encounter: Secondary | ICD-10-CM | POA: Insufficient documentation

## 2010-10-29 NOTE — Assessment & Plan Note (Signed)
HISTORY:  [name] is back regarding her bilateral ankle fracture dislocation.  She was also diagnosed with a right frontal astrocytoma. She was discharged from rehab on April 16 and progressing nicely.  She is now in the middle of radiation and therapies intentions of next week. She is to have a MRI of the brain scheduled in July.  She reports some increased confusion and memory issues as well as language problems and hair loss related to the therapy.  She has an outpatient PT working on her ankle, strength and range of motion still has some pain with weightbearing and at the end of day which may get up to 7-8/10.  She uses Ultram and oxycodone for breakthrough pain at this point.  REVIEW OF SYSTEMS:  Notable for nausea, fever, trouble walking.  Full 12- point review is in the written health and history section of the chart.  SOCIAL HISTORY:  Unchanged.  She is married, living with her husband. She is trying to apply for disability and spoke to her social worker recently about that.  PHYSICAL EXAMINATION:  VITAL SIGNS:  Blood pressure is 126/74, pulse 73, respiratory rate 18, and she is satting 100% on room air. GENERAL:  The patient is pleasant, alert and oriented x3.  She occasionally has some word-finding deficits.  She does keep notes and is able to use the organizer.  She has some problems with memory on occasion, but for most part quite confident. MUSCULOSKELETAL:  Right ankle remains tender along the distal fibula and tibia.  She has full passive range of motion and really actively can move in all planes of those bit weak in the 4/5 range.  Wounds are all healed essentially at this point.  Balance is fair with the walking boot.  She uses a walker for support.  Strength otherwise in the upper extremities is 5/5.  Sensory exam is grossly intact.  ASSESSMENT: 1. Right bimalleolar ankle fracture, status post open reduction     internal fixation. 2. Right frontal  astrocytoma.  PLAN: 1. Continue with outpatient PT to increase strength and improve     weightbearing tolerance and then improve range of motion and     proprioception at the ankle. 2. We will check liver function tests and Dilantin level today as I     cannot find that these were done recently.  We checked on the     computer system at [facility]. 3. Continue on Oncology radiation plan per Dr. Kathrynn [name] and Dr.     Darnelle [name].  Apparently the patient sees Dr. Kayleen [name], next month at     [facility] for second opinion. 4. The patient will use her oxycodone or tramadol for severe pain.  We     talked about desensitization exercises, massaging, etc., which will     be useful for the right ankle pain. 5. I will see her back here in about two months' time to follow up.  I     will speak to social worker about her disability claim which I     think is appropriate in this case.     [name], M.D. Electronically Signed    ZTS/MedQ D:  10/29/2010 09:58:00  T:  10/29/2010 22:46:45  Job #:  [id number]  cc:   [name]. [name], M.D. Fax: [phone]  [name], M.D. Fax: [phone]

## 2010-11-01 ENCOUNTER — Encounter: Payer: Self-pay | Admitting: Physical Therapy

## 2010-12-12 ENCOUNTER — Ambulatory Visit
Admission: RE | Admit: 2010-12-12 | Discharge: 2010-12-12 | Disposition: A | Discharge status: Home / Self Care | Payer: PRIVATE HEALTH INSURANCE | Source: Ambulatory Visit | Attending: Radiation Oncology | Admitting: Radiation Oncology

## 2010-12-17 ENCOUNTER — Ambulatory Visit (HOSPITAL_COMMUNITY)
Admission: RE | Admit: 2010-12-17 | Discharge: 2010-12-17 | Disposition: A | Discharge status: Home / Self Care | Payer: PRIVATE HEALTH INSURANCE | Source: Ambulatory Visit | Attending: Oncology | Admitting: Oncology

## 2010-12-17 DIAGNOSIS — C713 Malignant neoplasm of parietal lobe: Secondary | ICD-10-CM | POA: Insufficient documentation

## 2010-12-17 DIAGNOSIS — F29 Unspecified psychosis not due to a substance or known physiological condition: Secondary | ICD-10-CM | POA: Insufficient documentation

## 2010-12-17 DIAGNOSIS — R110 Nausea: Secondary | ICD-10-CM | POA: Insufficient documentation

## 2010-12-17 DIAGNOSIS — D496 Neoplasm of unspecified behavior of brain: Secondary | ICD-10-CM

## 2010-12-17 DIAGNOSIS — G319 Degenerative disease of nervous system, unspecified: Secondary | ICD-10-CM | POA: Insufficient documentation

## 2010-12-17 MED ORDER — GADOBENATE DIMEGLUMINE 529 MG/ML IV SOLN
15.0000 mL | Freq: Once | INTRAVENOUS | Status: AC | PRN
  Administered 2010-12-17: 15 mL via INTRAVENOUS

## 2010-12-24 ENCOUNTER — Encounter
Payer: PRIVATE HEALTH INSURANCE | Attending: Physical Medicine & Rehabilitation | Admitting: Physical Medicine & Rehabilitation

## 2010-12-24 ENCOUNTER — Encounter (HOSPITAL_BASED_OUTPATIENT_CLINIC_OR_DEPARTMENT_OTHER): Payer: PRIVATE HEALTH INSURANCE | Admitting: Oncology

## 2010-12-24 ENCOUNTER — Other Ambulatory Visit: Payer: Self-pay | Admitting: Oncology

## 2010-12-24 DIAGNOSIS — S82843A Displaced bimalleolar fracture of unspecified lower leg, initial encounter for closed fracture: Secondary | ICD-10-CM

## 2010-12-24 DIAGNOSIS — M25579 Pain in unspecified ankle and joints of unspecified foot: Secondary | ICD-10-CM | POA: Insufficient documentation

## 2010-12-24 DIAGNOSIS — C713 Malignant neoplasm of parietal lobe: Secondary | ICD-10-CM

## 2010-12-24 DIAGNOSIS — C719 Malignant neoplasm of brain, unspecified: Secondary | ICD-10-CM

## 2010-12-24 DIAGNOSIS — Z923 Personal history of irradiation: Secondary | ICD-10-CM | POA: Insufficient documentation

## 2010-12-24 DIAGNOSIS — F32A Depression, unspecified: Secondary | ICD-10-CM

## 2010-12-24 DIAGNOSIS — C711 Malignant neoplasm of frontal lobe: Secondary | ICD-10-CM | POA: Insufficient documentation

## 2010-12-24 DIAGNOSIS — F323 Major depressive disorder, single episode, severe with psychotic features: Secondary | ICD-10-CM

## 2010-12-24 LAB — CBC WITH DIFFERENTIAL/PLATELET
Eosinophils Absolute: 0 10*3/uL (ref 0.0–0.5)
HCT: 34 % — ABNORMAL LOW (ref 34.8–46.6)
LYMPH%: 23 % (ref 14.0–49.7)
MCHC: 35.4 g/dL (ref 31.5–36.0)
MCV: 92.1 fL (ref 79.5–101.0)
MONO#: 0.1 10*3/uL (ref 0.1–0.9)
MONO%: 6.7 % (ref 0.0–14.0)
NEUT%: 68.7 % (ref 38.4–76.8)
Platelets: 126 10*3/uL — ABNORMAL LOW (ref 145–400)
RBC: 3.69 10*6/uL — ABNORMAL LOW (ref 3.70–5.45)

## 2010-12-24 NOTE — Assessment & Plan Note (Signed)
HISTORY:  [name] is back regarding her multiple issues.  She had MRI done of her brain last week and it shows improvement in the tumor area as well as some atrophy.  The patient finished radiation therapies and sought a second opinion at [facility] per Dr. Kayleen Memos who agree with Dr. Darrall Dears plan.  She waits beginning of chemotherapy which last about 3 months apparently.  Her pain is about 5/10.  She has missed her pain at the right ankle, has bothersome when she bends, goes up steps.  As well also become painful when she is on for long periods of time.  Generally, she uses ACE wrap for support.  Sleep is fair.  She does use some rest as well as heat and ice for ankle discomfort as well as some tramadol.  REVIEW OF SYSTEMS:  Notable for the above.  She does have occasional dry cough, clear source.  SOCIAL HISTORY:  The patient is married.  Husband supportive.  No other changes noted.  PHYSICAL EXAMINATION:  VITAL SIGNS:  Blood pressure 120/66, pulse 95, respiratory 16, and she is satting 95% on room air. GENERAL:  The patient is pleasant, alert, oriented x3.  She uses a list and notes to organize her thoughts, does pretty well.  She still has some word-finding issues at time and issues with memory but generally she is a quite well with her organization and remembered all of her questions apparently today. MUSCULOSKELETAL:  She has pain in the right ankle more so with ankle dorsiflexion as well as inversion.  The areas remain swollen and 1+ around the injury site.  Postoperative scarring noted.  She does have 5/5 strength in the area and no gross sensory findings were noted.  She is wearing sandals and back today for walking and occasionally had a misstep or 2, but balance seemed to be intact and she had no major risk for following.  ASSESSMENT: 1. Right bimalleolar ankle fracture, status post open reduction and     internal fixation. 2. Right frontal  astrocytoma/oligodendroglioma.  The patient seems to     be responding to treatment.  PLAN: 1. Refill the patient's folic acid and iron today.  Recommend checking     iron studies as well as folic acid, B12 with her oncology labs. 2. Refill tramadol for breakthrough pain.  This seems to be working     well for her. 3. Regarding her anxiety, we will continue with Klonopin 0.5 t.i.d.     This works well.  She seems rather grounded today.  Cognition was     appropriate as well as her arousal. 4. Recommended right ankle supramalleolar orthosis for support of the     ankle with gait and for days when she is more active.  It should be     easy to find. 5. I will see her back here in about 4-6 months.  She will call me if     any problems or questions.     Ranelle Oyster, M.D. Electronically Signed    ZTS/MedQ D:  12/24/2010 12:14:57  T:  12/24/2010 15:29:03  Job #:  [id number]  cc:   Lowella Dell, M.D. Fax: [phone]

## 2011-01-13 ENCOUNTER — Other Ambulatory Visit: Payer: Self-pay | Admitting: Oncology

## 2011-01-13 ENCOUNTER — Encounter (HOSPITAL_BASED_OUTPATIENT_CLINIC_OR_DEPARTMENT_OTHER): Payer: PRIVATE HEALTH INSURANCE | Admitting: Oncology

## 2011-01-13 DIAGNOSIS — C713 Malignant neoplasm of parietal lobe: Secondary | ICD-10-CM

## 2011-01-13 LAB — CBC WITH DIFFERENTIAL/PLATELET
BASO%: 0.4 % (ref 0.0–2.0)
EOS%: 2.2 % (ref 0.0–7.0)
HCT: 37.4 % (ref 34.8–46.6)
LYMPH%: 38 % (ref 14.0–49.7)
MCH: 31.3 pg (ref 25.1–34.0)
MCHC: 35 g/dL (ref 31.5–36.0)
MONO#: 0.3 10*3/uL (ref 0.1–0.9)
MONO%: 11.3 % (ref 0.0–14.0)
NEUT%: 48.1 % (ref 38.4–76.8)
Platelets: 141 10*3/uL — ABNORMAL LOW (ref 145–400)
RBC: 4.18 10*6/uL (ref 3.70–5.45)
WBC: 2.7 10*3/uL — ABNORMAL LOW (ref 3.9–10.3)
nRBC: 0 % (ref 0–0)

## 2011-02-17 ENCOUNTER — Other Ambulatory Visit: Payer: Self-pay | Admitting: Oncology

## 2011-02-17 ENCOUNTER — Encounter (HOSPITAL_BASED_OUTPATIENT_CLINIC_OR_DEPARTMENT_OTHER): Payer: PRIVATE HEALTH INSURANCE | Admitting: Oncology

## 2011-02-17 DIAGNOSIS — C713 Malignant neoplasm of parietal lobe: Secondary | ICD-10-CM

## 2011-02-17 DIAGNOSIS — Z9181 History of falling: Secondary | ICD-10-CM

## 2011-02-17 LAB — CBC WITH DIFFERENTIAL/PLATELET
Basophils Absolute: 0 10*3/uL (ref 0.0–0.1)
EOS%: 3.8 % (ref 0.0–7.0)
HGB: 13 g/dL (ref 11.6–15.9)
MCH: 32.3 pg (ref 25.1–34.0)
MONO#: 0.2 10*3/uL (ref 0.1–0.9)
NEUT#: 1.3 10*3/uL — ABNORMAL LOW (ref 1.5–6.5)
RDW: 13.1 % (ref 11.2–14.5)
WBC: 2.3 10*3/uL — ABNORMAL LOW (ref 3.9–10.3)
lymph#: 0.6 10*3/uL — ABNORMAL LOW (ref 0.9–3.3)

## 2011-02-17 LAB — COMPREHENSIVE METABOLIC PANEL
ALT: 16 U/L (ref 0–35)
AST: 28 U/L (ref 0–37)
Albumin: 3.7 g/dL (ref 3.5–5.2)
BUN: 12 mg/dL (ref 6–23)
CO2: 24 mEq/L (ref 19–32)
Calcium: 10.4 mg/dL (ref 8.4–10.5)
Chloride: 103 mEq/L (ref 96–112)
Potassium: 4.4 mEq/L (ref 3.5–5.3)

## 2011-02-27 ENCOUNTER — Encounter (HOSPITAL_BASED_OUTPATIENT_CLINIC_OR_DEPARTMENT_OTHER): Payer: PRIVATE HEALTH INSURANCE | Admitting: Oncology

## 2011-02-27 ENCOUNTER — Other Ambulatory Visit: Payer: Self-pay | Admitting: Oncology

## 2011-02-27 DIAGNOSIS — C713 Malignant neoplasm of parietal lobe: Secondary | ICD-10-CM

## 2011-02-27 LAB — CBC WITH DIFFERENTIAL/PLATELET
BASO%: 0.5 % (ref 0.0–2.0)
EOS%: 5.2 % (ref 0.0–7.0)
HGB: 12.3 g/dL (ref 11.6–15.9)
MCH: 32.4 pg (ref 25.1–34.0)
MCHC: 35.2 g/dL (ref 31.5–36.0)
RDW: 13.4 % (ref 11.2–14.5)
lymph#: 1 10*3/uL (ref 0.9–3.3)

## 2011-02-27 LAB — COMPREHENSIVE METABOLIC PANEL
ALT: 12 U/L (ref 0–35)
AST: 21 U/L (ref 0–37)
Albumin: 2.9 g/dL — ABNORMAL LOW (ref 3.5–5.2)
Calcium: 10.1 mg/dL (ref 8.4–10.5)
Chloride: 104 mEq/L (ref 96–112)
Potassium: 3.3 mEq/L — ABNORMAL LOW (ref 3.5–5.3)
Total Protein: 6.1 g/dL (ref 6.0–8.3)

## 2011-03-10 ENCOUNTER — Other Ambulatory Visit: Payer: Self-pay | Admitting: Oncology

## 2011-03-10 ENCOUNTER — Encounter (HOSPITAL_BASED_OUTPATIENT_CLINIC_OR_DEPARTMENT_OTHER): Payer: PRIVATE HEALTH INSURANCE | Admitting: Oncology

## 2011-03-10 DIAGNOSIS — C713 Malignant neoplasm of parietal lobe: Secondary | ICD-10-CM

## 2011-03-10 LAB — CBC WITH DIFFERENTIAL/PLATELET
BASO%: 0.6 % (ref 0.0–2.0)
EOS%: 2.3 % (ref 0.0–7.0)
MCH: 32.1 pg (ref 25.1–34.0)
MCHC: 35.1 g/dL (ref 31.5–36.0)
NEUT%: 61.6 % (ref 38.4–76.8)
RDW: 13.2 % (ref 11.2–14.5)
lymph#: 0.7 10*3/uL — ABNORMAL LOW (ref 0.9–3.3)

## 2011-03-11 LAB — COMPREHENSIVE METABOLIC PANEL
ALT: 15 U/L (ref 0–35)
AST: 31 U/L (ref 0–37)
Albumin: 4 g/dL (ref 3.5–5.2)
Calcium: 10.3 mg/dL (ref 8.4–10.5)
Chloride: 106 mEq/L (ref 96–112)
Potassium: 4.4 mEq/L (ref 3.5–5.3)
Sodium: 142 mEq/L (ref 135–145)
Total Protein: 6.7 g/dL (ref 6.0–8.3)

## 2011-03-13 ENCOUNTER — Ambulatory Visit
Admission: RE | Admit: 2011-03-13 | Discharge: 2011-03-13 | Disposition: A | Discharge status: Home / Self Care | Payer: PRIVATE HEALTH INSURANCE | Source: Ambulatory Visit | Attending: Radiation Oncology | Admitting: Radiation Oncology

## 2011-03-17 ENCOUNTER — Encounter (HOSPITAL_BASED_OUTPATIENT_CLINIC_OR_DEPARTMENT_OTHER): Payer: PRIVATE HEALTH INSURANCE | Admitting: Oncology

## 2011-03-17 ENCOUNTER — Other Ambulatory Visit: Payer: Self-pay | Admitting: Oncology

## 2011-03-17 DIAGNOSIS — C713 Malignant neoplasm of parietal lobe: Secondary | ICD-10-CM

## 2011-03-17 LAB — CBC WITH DIFFERENTIAL/PLATELET
EOS%: 5.9 % (ref 0.0–7.0)
LYMPH%: 25.5 % (ref 14.0–49.7)
MCH: 32.1 pg (ref 25.1–34.0)
MCV: 91.8 fL (ref 79.5–101.0)
MONO%: 9.3 % (ref 0.0–14.0)
Platelets: 152 10*3/uL (ref 145–400)
RBC: 3.98 10*6/uL (ref 3.70–5.45)
RDW: 13.3 % (ref 11.2–14.5)

## 2011-03-17 LAB — COMPREHENSIVE METABOLIC PANEL
AST: 44 U/L — ABNORMAL HIGH (ref 0–37)
Albumin: 3.2 g/dL — ABNORMAL LOW (ref 3.5–5.2)
Alkaline Phosphatase: 141 U/L — ABNORMAL HIGH (ref 39–117)
BUN: 15 mg/dL (ref 6–23)
Potassium: 3.8 mEq/L (ref 3.5–5.3)
Sodium: 136 mEq/L (ref 135–145)
Total Bilirubin: 0.7 mg/dL (ref 0.3–1.2)

## 2011-03-18 ENCOUNTER — Other Ambulatory Visit: Payer: Self-pay | Admitting: Physician Assistant

## 2011-03-24 ENCOUNTER — Other Ambulatory Visit: Payer: Self-pay | Admitting: Oncology

## 2011-03-24 ENCOUNTER — Encounter (HOSPITAL_BASED_OUTPATIENT_CLINIC_OR_DEPARTMENT_OTHER): Payer: PRIVATE HEALTH INSURANCE | Admitting: Oncology

## 2011-03-24 DIAGNOSIS — C719 Malignant neoplasm of brain, unspecified: Secondary | ICD-10-CM

## 2011-03-24 DIAGNOSIS — C713 Malignant neoplasm of parietal lobe: Secondary | ICD-10-CM

## 2011-03-24 DIAGNOSIS — Z923 Personal history of irradiation: Secondary | ICD-10-CM

## 2011-03-24 DIAGNOSIS — Z79899 Other long term (current) drug therapy: Secondary | ICD-10-CM

## 2011-03-24 LAB — COMPREHENSIVE METABOLIC PANEL
ALT: 16 U/L (ref 0–35)
Alkaline Phosphatase: 115 U/L (ref 39–117)
Glucose, Bld: 72 mg/dL (ref 70–99)
Sodium: 141 mEq/L (ref 135–145)
Total Bilirubin: 0.4 mg/dL (ref 0.3–1.2)
Total Protein: 5.9 g/dL — ABNORMAL LOW (ref 6.0–8.3)

## 2011-03-24 LAB — CBC WITH DIFFERENTIAL/PLATELET
BASO%: 0.3 % (ref 0.0–2.0)
LYMPH%: 43.7 % (ref 14.0–49.7)
MCH: 32.3 pg (ref 25.1–34.0)
MCHC: 35.3 g/dL (ref 31.5–36.0)
MCV: 91.3 fL (ref 79.5–101.0)
MONO%: 11.8 % (ref 0.0–14.0)
Platelets: 171 10*3/uL (ref 145–400)
RBC: 3.68 10*6/uL — ABNORMAL LOW (ref 3.70–5.45)

## 2011-03-31 ENCOUNTER — Other Ambulatory Visit: Payer: Self-pay | Admitting: Oncology

## 2011-03-31 ENCOUNTER — Encounter (HOSPITAL_BASED_OUTPATIENT_CLINIC_OR_DEPARTMENT_OTHER): Payer: PRIVATE HEALTH INSURANCE | Admitting: Oncology

## 2011-03-31 ENCOUNTER — Ambulatory Visit (HOSPITAL_COMMUNITY)
Admission: RE | Admit: 2011-03-31 | Discharge: 2011-03-31 | Disposition: A | Discharge status: Home / Self Care | Payer: PRIVATE HEALTH INSURANCE | Source: Ambulatory Visit | Attending: Oncology | Admitting: Oncology

## 2011-03-31 DIAGNOSIS — C719 Malignant neoplasm of brain, unspecified: Secondary | ICD-10-CM

## 2011-03-31 DIAGNOSIS — C713 Malignant neoplasm of parietal lobe: Secondary | ICD-10-CM

## 2011-03-31 LAB — COMPREHENSIVE METABOLIC PANEL
AST: 40 U/L — ABNORMAL HIGH (ref 0–37)
Albumin: 3.2 g/dL — ABNORMAL LOW (ref 3.5–5.2)
Alkaline Phosphatase: 139 U/L — ABNORMAL HIGH (ref 39–117)
Calcium: 10.4 mg/dL (ref 8.4–10.5)
Chloride: 105 mEq/L (ref 96–112)
Glucose, Bld: 82 mg/dL (ref 70–99)
Potassium: 4.5 mEq/L (ref 3.5–5.3)
Sodium: 140 mEq/L (ref 135–145)
Total Protein: 6.5 g/dL (ref 6.0–8.3)

## 2011-03-31 LAB — CBC WITH DIFFERENTIAL/PLATELET
EOS%: 2.8 % (ref 0.0–7.0)
Eosinophils Absolute: 0.1 10*3/uL (ref 0.0–0.5)
MCV: 91.7 fL (ref 79.5–101.0)
MONO%: 8.9 % (ref 0.0–14.0)
NEUT#: 1.8 10*3/uL (ref 1.5–6.5)
RBC: 4.02 10*6/uL (ref 3.70–5.45)
RDW: 13.8 % (ref 11.2–14.5)
lymph#: 0.8 10*3/uL — ABNORMAL LOW (ref 0.9–3.3)

## 2011-03-31 MED ORDER — GADOBENATE DIMEGLUMINE 529 MG/ML IV SOLN
14.0000 mL | Freq: Once | INTRAVENOUS | Status: AC | PRN
  Administered 2011-03-31: 14 mL via INTRAVENOUS

## 2011-04-08 ENCOUNTER — Other Ambulatory Visit: Payer: Self-pay | Admitting: Oncology

## 2011-04-08 ENCOUNTER — Other Ambulatory Visit (HOSPITAL_BASED_OUTPATIENT_CLINIC_OR_DEPARTMENT_OTHER): Payer: PRIVATE HEALTH INSURANCE | Admitting: Lab

## 2011-04-08 ENCOUNTER — Telehealth: Payer: Self-pay | Admitting: Oncology

## 2011-04-08 ENCOUNTER — Encounter: Payer: Self-pay | Admitting: Physician Assistant

## 2011-04-08 ENCOUNTER — Ambulatory Visit (HOSPITAL_BASED_OUTPATIENT_CLINIC_OR_DEPARTMENT_OTHER): Payer: PRIVATE HEALTH INSURANCE | Admitting: Physician Assistant

## 2011-04-08 ENCOUNTER — Ambulatory Visit (HOSPITAL_COMMUNITY)
Admission: RE | Admit: 2011-04-08 | Discharge: 2011-04-08 | Disposition: A | Discharge status: Home / Self Care | Payer: PRIVATE HEALTH INSURANCE | Source: Ambulatory Visit | Attending: Physician Assistant | Admitting: Physician Assistant

## 2011-04-08 VITALS — BP 148/88 | HR 76 | Temp 97.8°F | Ht 64.0 in | Wt 152.6 lb

## 2011-04-08 DIAGNOSIS — M79609 Pain in unspecified limb: Secondary | ICD-10-CM

## 2011-04-08 DIAGNOSIS — C719 Malignant neoplasm of brain, unspecified: Secondary | ICD-10-CM

## 2011-04-08 DIAGNOSIS — C713 Malignant neoplasm of parietal lobe: Secondary | ICD-10-CM

## 2011-04-08 DIAGNOSIS — M79602 Pain in left arm: Secondary | ICD-10-CM

## 2011-04-08 DIAGNOSIS — W19XXXA Unspecified fall, initial encounter: Secondary | ICD-10-CM | POA: Insufficient documentation

## 2011-04-08 HISTORY — DX: Malignant neoplasm of brain, unspecified: C71.9

## 2011-04-08 LAB — COMPREHENSIVE METABOLIC PANEL
ALT: 13 U/L (ref 0–35)
AST: 27 U/L (ref 0–37)
Albumin: 3 g/dL — ABNORMAL LOW (ref 3.5–5.2)
Alkaline Phosphatase: 120 U/L — ABNORMAL HIGH (ref 39–117)
BUN: 19 mg/dL (ref 6–23)
CO2: 28 mEq/L (ref 19–32)
Calcium: 10.3 mg/dL (ref 8.4–10.5)
Chloride: 102 mEq/L (ref 96–112)
Creatinine, Ser: 1.2 mg/dL — ABNORMAL HIGH (ref 0.50–1.10)
Glucose, Bld: 97 mg/dL (ref 70–99)
Potassium: 4.2 mEq/L (ref 3.5–5.3)
Sodium: 138 mEq/L (ref 135–145)
Total Bilirubin: 0.7 mg/dL (ref 0.3–1.2)
Total Protein: 6.1 g/dL (ref 6.0–8.3)

## 2011-04-08 LAB — CBC WITH DIFFERENTIAL/PLATELET
BASO%: 0.4 % (ref 0.0–2.0)
Basophils Absolute: 0 10*3/uL (ref 0.0–0.1)
EOS%: 2.8 % (ref 0.0–7.0)
Eosinophils Absolute: 0.1 10*3/uL (ref 0.0–0.5)
HCT: 34.4 % — ABNORMAL LOW (ref 34.8–46.6)
HGB: 12 g/dL (ref 11.6–15.9)
LYMPH%: 25.9 % (ref 14.0–49.7)
MCH: 32.3 pg (ref 25.1–34.0)
MCHC: 34.9 g/dL (ref 31.5–36.0)
MCV: 92.6 fL (ref 79.5–101.0)
MONO#: 0.3 10*3/uL (ref 0.1–0.9)
MONO%: 11.8 % (ref 0.0–14.0)
NEUT#: 1.5 10*3/uL (ref 1.5–6.5)
NEUT%: 59.1 % (ref 38.4–76.8)
Platelets: 90 10*3/uL — ABNORMAL LOW (ref 145–400)
RBC: 3.72 10*6/uL (ref 3.70–5.45)
RDW: 14.4 % (ref 11.2–14.5)
WBC: 2.6 10*3/uL — ABNORMAL LOW (ref 3.9–10.3)
lymph#: 0.7 10*3/uL — ABNORMAL LOW (ref 0.9–3.3)

## 2011-04-08 LAB — PHENYTOIN LEVEL, TOTAL: Phenytoin Lvl: 7.4 ug/mL — ABNORMAL LOW (ref 10.0–20.0)

## 2011-04-08 NOTE — Telephone Encounter (Signed)
gv pt appt for nov2012.  sent pt to [facility] for xray of the left  forearm

## 2011-04-08 NOTE — Progress Notes (Signed)
Hematology and Oncology Follow Up Visit  ARMEDA PLUMB [id number] 09-26-1960 50 y.o. 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 She continues on Temodar and in fact just began her next cycle this morning, and a dose of 300 mg daily x5 days. She tolerates the Temodar well and has a few new complaints today. She continues to feel a little "off balance" at times. She admits to a fall approximately 2 weeks ago when she tripped at home. She bruised her left knee which seems to have recovered. She did not hit her head. She had no seizure activity or loss of consciousness. Since that time however she has noted a large bruise on the left elbow which is slightly swollen and very tender to touch.  Otherwise patient complains of a runny nose and a dry scratchy throat. She is currently not taking anything for allergies and we discussed some options. She is tired today. She continues to have some difficulty searching for words but is able to communicate well today. She admits that it has been a very busy and stressful week. She has had a lot going on with her family. She does find that when she has more stress, and is more tired, she seems a little more unsteady, and is probably more likely to fall. She has been utilizing a cane for support with walking.  Otherwise a detailed review of systems is unremarkable. I will make mention of the fact that Aubriauna continues to have some occasional headaches although these have not worsened or changed. She notes no visual changes, no diplopia. Again no seizure activities.   Review of Systems: Constitutional:  generally weak, no fevers or chills Eyes: negative  WUJ:[id number]  Cardiovascular: no chest pain or dyspnea on exertion Respiratory: no cough, shortness of breath, or wheezing Neurological: positive for - headaches Dermatological: negative Gastrointestinal: no abdominal pain,  change in bowel habits, or black or bloody stools Genito-Urinary: no dysuria, trouble voiding, or hematuria Hematological and Lymphatic: negative Breast: negative Musculoskeletal: positive for - pain in arm - left Remaining ROS negative.  Medications: I have reviewed the patient's current medications.  Allergies:  Allergies  Allergen Reactions  . Vasotec Other (See Comments)    Causes angioedema     Physical Exam: Blood pressure 148/88, pulse 76, temperature 97.8 F (36.6 C), height 5\' 4"  (1.626 m), weight 152 lb 9.6 oz (69.219 kg). HEENT:  Sclerae anicteric, conjunctivae pink.  Oropharynx clear.  No mucositis or candidiasis.  Nodes:  No cervical, supraclavicular, or axillary lymphadenopathy palpated.  Breast Exam: Deferred. Lungs:  Clear to auscultation bilaterally.  No crackles, rhonchi, or wheezes.  Heart:  Regular rate and rhythm.  Abdomen:  Soft, nontender.  Positive bowel sounds.  No organomegaly or masses palpated.  Musculoskeletal:  No focal spinal tenderness to palpation.  There is bruising, slight swelling and tenderness to gentle palpation in the left forearm. Extremities:  Benign.  No peripheral edema or cyanosis.  Skin:  Benign.  Neuro:  Nonfocal.   Lab Results: Lab Results  Component Value Date   WBC 5.1 09/12/2010   HGB 12.0 04/08/2011   HCT 34.4* 04/08/2011   MCV 92.6 04/08/2011   PLT 90* 04/08/2011     Chemistry      Component Value Date/Time   NA 138 04/08/2011 1114   K 4.2 04/08/2011 1114   CL 102 04/08/2011 1114   CO2 28 04/08/2011 1114  BUN 19 04/08/2011 1114   CREATININE 1.20* 04/08/2011 1114      Component Value Date/Time   CALCIUM 10.3 04/08/2011 1114   ALKPHOS 120* 04/08/2011 1114   AST 27 04/08/2011 1114   ALT 13 04/08/2011 1114   BILITOT 0.7 04/08/2011 1114       Radiological Studies: MRI of the head was obtained 1029 and 12 for followup. The exam appeared very stable when compared to studies on 12/17/2010. There was no imaging evidence of  progression.  X-ray of the left forearm and x-ray of the left forearm has been ordered, results pending.  Impression and Plan: 50 year old [place] woman, status post open brain biopsy in March of 2012 for a high grade astrocytoma, with a normal 1p/19q chromosomal analysis. Tumor was unresectable. Status post chemotherapy completed in June of 2012. Currently day 1 cycle 3 of temozolomide, currently taken at a dose of 300 mg daily x5 days.  This case has been reviewed with Dr. Darnelle Catalan. He has suggested that Kamarii continue with her current dose of Temodar, despite her low platelet count of 90,000. We will continue to follow her labs closely, and in fact will have her return on the weekly basis for the next 2 weeks to follow this platelet count. Specifically she will return to followup with Dr. Darnelle Catalan in 2-3 weeks. Of course she knows to call with any changes or problems.  I have also referred the patient for a plain film x-ray of the left forearm to evaluate for any trauma caused by her recent fall.  Spent more than half the time coordinating care.    Andri Prestia, PA-C 11/6/20121:31 PM

## 2011-04-14 ENCOUNTER — Telehealth: Payer: Self-pay | Admitting: *Deleted

## 2011-04-14 ENCOUNTER — Other Ambulatory Visit: Payer: PRIVATE HEALTH INSURANCE

## 2011-04-16 ENCOUNTER — Encounter: Payer: PRIVATE HEALTH INSURANCE | Admitting: Physical Medicine & Rehabilitation

## 2011-04-18 ENCOUNTER — Other Ambulatory Visit: Payer: Self-pay | Admitting: Physician Assistant

## 2011-04-18 ENCOUNTER — Telehealth: Payer: Self-pay | Admitting: *Deleted

## 2011-04-18 NOTE — Telephone Encounter (Signed)
LEFT PATIENT MESSAGE TO INFORM THE PATIENT OF THE NEW TIME BUT SAME DAY FOR THE APPOINTMENT

## 2011-04-22 ENCOUNTER — Other Ambulatory Visit (HOSPITAL_BASED_OUTPATIENT_CLINIC_OR_DEPARTMENT_OTHER): Payer: PRIVATE HEALTH INSURANCE | Admitting: Lab

## 2011-04-22 DIAGNOSIS — C719 Malignant neoplasm of brain, unspecified: Secondary | ICD-10-CM

## 2011-04-22 LAB — CBC WITH DIFFERENTIAL/PLATELET
BASO%: 0.3 % (ref 0.0–2.0)
HCT: 34.2 % — ABNORMAL LOW (ref 34.8–46.6)
LYMPH%: 29.2 % (ref 14.0–49.7)
MCHC: 34.3 g/dL (ref 31.5–36.0)
MONO#: 0.3 10*3/uL (ref 0.1–0.9)
NEUT%: 55.2 % (ref 38.4–76.8)
Platelets: 133 10*3/uL — ABNORMAL LOW (ref 145–400)
WBC: 2.2 10*3/uL — ABNORMAL LOW (ref 3.9–10.3)

## 2011-04-22 LAB — COMPREHENSIVE METABOLIC PANEL
ALT: 14 U/L (ref 0–35)
CO2: 27 mEq/L (ref 19–32)
Creatinine, Ser: 1.16 mg/dL — ABNORMAL HIGH (ref 0.50–1.10)
Glucose, Bld: 90 mg/dL (ref 70–99)
Total Bilirubin: 0.8 mg/dL (ref 0.3–1.2)

## 2011-04-29 ENCOUNTER — Other Ambulatory Visit (HOSPITAL_BASED_OUTPATIENT_CLINIC_OR_DEPARTMENT_OTHER): Payer: PRIVATE HEALTH INSURANCE | Admitting: Lab

## 2011-04-29 ENCOUNTER — Ambulatory Visit (HOSPITAL_BASED_OUTPATIENT_CLINIC_OR_DEPARTMENT_OTHER): Payer: PRIVATE HEALTH INSURANCE | Admitting: Physician Assistant

## 2011-04-29 ENCOUNTER — Ambulatory Visit: Payer: PRIVATE HEALTH INSURANCE | Admitting: Oncology

## 2011-04-29 ENCOUNTER — Telehealth: Payer: Self-pay | Admitting: Oncology

## 2011-04-29 ENCOUNTER — Encounter: Payer: Self-pay | Admitting: Physician Assistant

## 2011-04-29 VITALS — BP 132/85 | HR 99 | Temp 98.5°F | Ht 64.0 in | Wt 149.6 lb

## 2011-04-29 DIAGNOSIS — D6959 Other secondary thrombocytopenia: Secondary | ICD-10-CM

## 2011-04-29 DIAGNOSIS — R112 Nausea with vomiting, unspecified: Secondary | ICD-10-CM

## 2011-04-29 DIAGNOSIS — C713 Malignant neoplasm of parietal lobe: Secondary | ICD-10-CM

## 2011-04-29 DIAGNOSIS — C719 Malignant neoplasm of brain, unspecified: Secondary | ICD-10-CM

## 2011-04-29 DIAGNOSIS — Z9181 History of falling: Secondary | ICD-10-CM

## 2011-04-29 LAB — CBC WITH DIFFERENTIAL/PLATELET
Eosinophils Absolute: 0.1 10*3/uL (ref 0.0–0.5)
HCT: 33 % — ABNORMAL LOW (ref 34.8–46.6)
HGB: 11.6 g/dL (ref 11.6–15.9)
LYMPH%: 28.4 % (ref 14.0–49.7)
MONO#: 0.2 10*3/uL (ref 0.1–0.9)
NEUT#: 1.5 10*3/uL (ref 1.5–6.5)
NEUT%: 61.8 % (ref 38.4–76.8)
Platelets: 93 10*3/uL — ABNORMAL LOW (ref 145–400)
WBC: 2.4 10*3/uL — ABNORMAL LOW (ref 3.9–10.3)

## 2011-04-29 NOTE — Telephone Encounter (Signed)
Gv pt appt for dec2012.  scheduled pt for ct scan on 11/30 @ [facility]

## 2011-04-29 NOTE — Progress Notes (Signed)
Hematology and Oncology Follow Up Visit  [name] [id number] [birth year] 50 y.o. 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 She takes the Temodar at a dose of 300 mg daily for 5 days, and is due to initiate her next cycle of Temodar next week on December 4. She tends to have some slightly loose stools when taking the Temodar but that resolves. She does mention that she has had increased nausea with emesis over the past 2 weeks. She's also had some increased dizziness, and in fact tells me that she "passed out" on Friday, November 23. She woke up during the night and got up to go to the bathroom. As she walked through the bathroom, she felt dizzy, passed out, and actually hit her head on a framed picture on the floor. The glass broke, and she did obtain a laceration to the right side of her head. She did not have this evaluated at that time. The bleeding stopped easily, despite her history of thrombocytopenia.. She denies any increased symptoms, specifically no increased headache dizziness or additional loss of consciousness, since this head injury. She continues to have gait disturbance and utilizes a cane for ambulation. She denies any change in vision and has had no seizure activity. She does have some occasional shortness of breath with activity which is stable.  A detailed review of systems is otherwise noncontributory as noted below.  Review of Systems: Constitutional:  generally weak Eyes: negative WUJ:[id number] Cardiovascular: no chest pain or dyspnea on exertion Respiratory: positive for - mild shortness of breath Neurological: positive for - dizziness, gait disturbance, headaches and impaired coordination/balance Dermatological: negative Gastrointestinal: positive for - nausea/vomiting Genito-Urinary: no dysuria, trouble voiding, or hematuria Hematological and Lymphatic:  negative Breast: negative Musculoskeletal: negative Remaining ROS negative.  Medications: I have reviewed the patient's current medications.  Allergies:  Allergies  Allergen Reactions  . Vasotec Other (See Comments)    Causes angioedema     Physical Exam:  Blood pressure 132/85, pulse 99, temperature 98.5 F (36.9 C), temperature source Oral, height 5\' 4"  (1.626 m), weight 149 lb 9.6 oz (67.858 kg). HEENT:  There is evidence of a laceration in the right occipital region with an area of dried blood, mildly tender to touch. Sclerae anicteric, conjunctivae pink.  Oropharynx clear.  No mucositis or candidiasis.  Nodes:  No cervical, supraclavicular, or axillary lymphadenopathy palpated.  Breast Exam: Deferred.  Lungs:  Clear to auscultation bilaterally.  No crackles, rhonchi, or wheezes.  Heart:  Regular rate and rhythm.  Abdomen:  Soft, nontender.  Positive bowel sounds.  No organomegaly or masses palpated.  Musculoskeletal:  No focal spinal tenderness to palpation.  Extremities:  Benign.  No peripheral edema or cyanosis.  Skin:  Benign.  Neuro:  Nonfocal. Cranial nerves grossly intact. Patient is alert and oriented x3.   Lab Results: Lab Results  Component Value Date   WBC 2.4* 04/29/2011   HGB 11.6 04/29/2011   HCT 33.0* 04/29/2011   MCV 92.9 04/29/2011   PLT 93* 04/29/2011   NEUTROABS 1.5 04/29/2011     Chemistry      Component Value Date/Time   NA 141 04/22/2011 1414   K 4.3 04/22/2011 1414   CL 108 04/22/2011 1414   CO2 27 04/22/2011 1414   BUN 18 04/22/2011 1414   CREATININE 1.16* 04/22/2011 1414      Component Value Date/Time  CALCIUM 9.8 04/22/2011 1414   ALKPHOS 98 04/22/2011 1414   AST 30 04/22/2011 1414   ALT 14 04/22/2011 1414   BILITOT 0.8 04/22/2011 1414            Radiological Studies: Noncontrast CT of the brain is pending at.  Impression and Plan: 50 year old [place] woman, status post open brain biopsy in March of 2012 for a high grade  astrocytoma, with a normal 1p/19q chromosomal analysis. Tumor was unresectable. Status post chemotherapy completed in June of 2012. Currently scheduled to begin day 1, cycle 4 of Temodar on Tuesday, December 4, taken at a dose of 300 mg daily x5 days. Also with history of recent fall involving head trauma and laceration. Asymptomatic chemotherapy-induced thrombocytopenia.  This case was reviewed with Dr. Darnelle Catalan, and he agrees that it would be prudent to obtain a noncontrast CT of the head to evaluate for any signs of bleeding in light of the patient's recent fall and continued thrombocytopenia. Recall that a brain MRI in late October, 4 weeks ago, was stable as compared to studies in July.  We will plan on initiating Temodar again next week as scheduled, day 1, cycle 4 on December 4. We'll repeat labs that same day to reevaluate platelet and white counts prior to initiating cycle 4. She will then return 1 week later for followup labs and a visit with Dr. Darnelle Catalan to assess tolerance.   This plan was reviewed with the patient, who voices understanding and agreement.  She knows to call with any changes or problems.    Rakisha Pincock, PA-C 11/27/20123:34 PM

## 2011-05-02 ENCOUNTER — Ambulatory Visit (HOSPITAL_COMMUNITY)
Admission: RE | Admit: 2011-05-02 | Discharge: 2011-05-02 | Disposition: A | Discharge status: Home / Self Care | Payer: PRIVATE HEALTH INSURANCE | Source: Ambulatory Visit | Attending: Physician Assistant | Admitting: Physician Assistant

## 2011-05-02 ENCOUNTER — Telehealth: Payer: Self-pay | Admitting: Physician Assistant

## 2011-05-02 ENCOUNTER — Encounter (HOSPITAL_COMMUNITY): Payer: Self-pay

## 2011-05-02 DIAGNOSIS — S0190XA Unspecified open wound of unspecified part of head, initial encounter: Secondary | ICD-10-CM | POA: Insufficient documentation

## 2011-05-02 DIAGNOSIS — C719 Malignant neoplasm of brain, unspecified: Secondary | ICD-10-CM

## 2011-05-02 DIAGNOSIS — W19XXXA Unspecified fall, initial encounter: Secondary | ICD-10-CM | POA: Insufficient documentation

## 2011-05-02 DIAGNOSIS — M949 Disorder of cartilage, unspecified: Secondary | ICD-10-CM | POA: Insufficient documentation

## 2011-05-02 DIAGNOSIS — M899 Disorder of bone, unspecified: Secondary | ICD-10-CM | POA: Insufficient documentation

## 2011-05-02 NOTE — Telephone Encounter (Signed)
Left message on identified voice mail that CT of Head today was ok. Pt to return next week for followup labs.

## 2011-05-06 ENCOUNTER — Other Ambulatory Visit (HOSPITAL_BASED_OUTPATIENT_CLINIC_OR_DEPARTMENT_OTHER): Payer: PRIVATE HEALTH INSURANCE | Admitting: Lab

## 2011-05-06 DIAGNOSIS — C719 Malignant neoplasm of brain, unspecified: Secondary | ICD-10-CM

## 2011-05-06 DIAGNOSIS — D6959 Other secondary thrombocytopenia: Secondary | ICD-10-CM

## 2011-05-06 LAB — CBC WITH DIFFERENTIAL/PLATELET
BASO%: 0 % (ref 0.0–2.0)
Basophils Absolute: 0 10*3/uL (ref 0.0–0.1)
EOS%: 2.7 % (ref 0.0–7.0)
HCT: 35.5 % (ref 34.8–46.6)
LYMPH%: 28.6 % (ref 14.0–49.7)
MCH: 31.4 pg (ref 25.1–34.0)
MCHC: 34.4 g/dL (ref 31.5–36.0)
MONO#: 0.3 10*3/uL (ref 0.1–0.9)
NEUT%: 57.2 % (ref 38.4–76.8)
Platelets: 137 10*3/uL — ABNORMAL LOW (ref 145–400)

## 2011-05-06 LAB — COMPREHENSIVE METABOLIC PANEL
ALT: 34 U/L (ref 0–35)
BUN: 18 mg/dL (ref 6–23)
CO2: 27 mEq/L (ref 19–32)
Creatinine, Ser: 1.13 mg/dL — ABNORMAL HIGH (ref 0.50–1.10)
Total Bilirubin: 0.6 mg/dL (ref 0.3–1.2)

## 2011-05-12 ENCOUNTER — Other Ambulatory Visit (HOSPITAL_BASED_OUTPATIENT_CLINIC_OR_DEPARTMENT_OTHER): Payer: PRIVATE HEALTH INSURANCE | Admitting: Lab

## 2011-05-12 ENCOUNTER — Ambulatory Visit (HOSPITAL_BASED_OUTPATIENT_CLINIC_OR_DEPARTMENT_OTHER): Payer: PRIVATE HEALTH INSURANCE | Admitting: Oncology

## 2011-05-12 VITALS — BP 148/91 | HR 97 | Temp 98.2°F | Ht 64.0 in | Wt 152.0 lb

## 2011-05-12 DIAGNOSIS — C719 Malignant neoplasm of brain, unspecified: Secondary | ICD-10-CM

## 2011-05-12 LAB — COMPREHENSIVE METABOLIC PANEL
ALT: 27 U/L (ref 0–35)
AST: 37 U/L (ref 0–37)
BUN: 22 mg/dL (ref 6–23)
Creatinine, Ser: 1.28 mg/dL — ABNORMAL HIGH (ref 0.50–1.10)
Total Bilirubin: 0.5 mg/dL (ref 0.3–1.2)

## 2011-05-12 LAB — CBC WITH DIFFERENTIAL/PLATELET
BASO%: 0.7 % (ref 0.0–2.0)
EOS%: 4 % (ref 0.0–7.0)
HCT: 33.7 % — ABNORMAL LOW (ref 34.8–46.6)
LYMPH%: 26.8 % (ref 14.0–49.7)
MCH: 33 pg (ref 25.1–34.0)
MCHC: 35 g/dL (ref 31.5–36.0)
MCV: 94.3 fL (ref 79.5–101.0)
MONO%: 14.4 % — ABNORMAL HIGH (ref 0.0–14.0)
NEUT%: 54.1 % (ref 38.4–76.8)
Platelets: 190 10*3/uL (ref 145–400)

## 2011-05-12 NOTE — Progress Notes (Signed)
Hematology and Oncology Follow Up Visit  [name] [id number] Apr 15, 1961 50 y.o. 05/12/2011 11:32 AM   Interim History:  [name] returns today with a friend for followup of [name]'s astrocytoma. The patient started her fourth cycle of temozolomide December 4. She is tolerating the medication well, with some constipation, minimal nausea, no vomiting. She gets the medication through [facility]. There have been no issues regarding financial concerns there.  Review of systems is otherwise stable. Unfortunately she has fallen twice, once in October, once late November. She just fell yesterday she says stepping down from her husbands truck, which has a very high seed she says. She doesn't use a cane at home and actually she is not using a cane today. The fall in late November which resulted in a head laceration but otherwise no bleed (she had a negative head CT) occurred at night and I suggested she place her bedside commode next to the bed son at night she doesn't have to walk in the dark.  Medications: I have reviewed the patient's current medications.  Allergies:  Allergies  Allergen Reactions  . Vasotec Other (See Comments)    Causes angioedema     Physical Exam:  Blood pressure 148/91, pulse 97, temperature 98.2 F (36.8 C), height 5\' 4"  (1.626 m), weight 152 lb (68.947 kg). Sclerae are not icteric the pupils are equal and reactive there is no nystagmus and extraocular movements are normal; lungs are clear to auscultation and percussion; there is no peripheral adenopathy; heart regular rate and rhythm; abdomen benign; no peripheral edema no focal spinal tenderness; neurologic exam is nonfocal, with no sensory level, 5 minus out of 5 upper extremities, 5 over 5 lower extremities. There is an minimal tremor when she holds her hands and in front of her. Lab Results: Lab Results  Component Value Date   WBC 2.9* 05/12/2011   HGB 11.8 05/12/2011   HCT 33.7* 05/12/2011   MCV 94.3  05/12/2011   PLT 190 05/12/2011   NEUTROABS 1.6 05/12/2011     Chemistry      Component Value Date/Time   NA 138 05/12/2011 1021   K 3.7 05/12/2011 1021   CL 102 05/12/2011 1021   CO2 27 05/12/2011 1021   BUN 22 05/12/2011 1021   CREATININE 1.28* 05/12/2011 1021      Component Value Date/Time   CALCIUM 9.9 05/12/2011 1021   ALKPHOS 108 05/12/2011 1021   AST 37 05/12/2011 1021   ALT 27 05/12/2011 1021   BILITOT 0.5 05/12/2011 1021            Radiological Studies: Head CT late November 2012 showed no bleed, and no evidence of disease recurrence  Impression: 50 year old [place] woman, status post open brain biopsy in March of 2012 for a high grade astrocytoma, with a normal 1p/19q chromosomal analysis. Tumor was unresectable. Status post chemotherapy completed in June of 2012. Currently status post cycle 4 of Temodar started on Tuesday, December 4, taken at a dose of 300 mg daily x5 days.    Plan: We are increasing her Temodar to a total of 350 mg daily for 5 days. This is 200 mg per meter squared which is a target dose. Final her counts have recovered sufficiently that we can do this.  Because for the first few months we were not able to give her the full dose I am going to add a seventh month of temozolomide so she will receive treatments January February and March.  I have made her  lab appointment for December 20 and 27th and a visit here on January 8. She will start her fifth cycle of Mazola minor in general he first she will have an MRI of the brain performed February visit here. That also has been scheduled.   MAGRINAT,GUSTAV C, PA-C 12/10/201211:32 AM

## 2011-05-19 ENCOUNTER — Encounter
Payer: PRIVATE HEALTH INSURANCE | Attending: Physical Medicine & Rehabilitation | Admitting: Physical Medicine & Rehabilitation

## 2011-05-19 DIAGNOSIS — S8290XS Unspecified fracture of unspecified lower leg, sequela: Secondary | ICD-10-CM | POA: Insufficient documentation

## 2011-05-19 DIAGNOSIS — F323 Major depressive disorder, single episode, severe with psychotic features: Secondary | ICD-10-CM

## 2011-05-19 DIAGNOSIS — Z79899 Other long term (current) drug therapy: Secondary | ICD-10-CM | POA: Insufficient documentation

## 2011-05-19 DIAGNOSIS — X58XXXS Exposure to other specified factors, sequela: Secondary | ICD-10-CM | POA: Insufficient documentation

## 2011-05-19 DIAGNOSIS — C711 Malignant neoplasm of frontal lobe: Secondary | ICD-10-CM | POA: Insufficient documentation

## 2011-05-19 DIAGNOSIS — R42 Dizziness and giddiness: Secondary | ICD-10-CM | POA: Insufficient documentation

## 2011-05-19 DIAGNOSIS — C719 Malignant neoplasm of brain, unspecified: Secondary | ICD-10-CM

## 2011-05-19 DIAGNOSIS — F411 Generalized anxiety disorder: Secondary | ICD-10-CM | POA: Insufficient documentation

## 2011-05-19 DIAGNOSIS — S82843A Displaced bimalleolar fracture of unspecified lower leg, initial encounter for closed fracture: Secondary | ICD-10-CM

## 2011-05-19 NOTE — Assessment & Plan Note (Signed)
HISTORY:  [name] is back regarding her multiple issues.  She has actually been doing fairly well.  She has finished the 3rd of her 7 Temodar treatments for her brain cancer.  She has occasional pain, but for the most part does fairly well.  Her right ankle gives her some discomfort but is under fair control.  She states to me that she has had 3 falls at home and they generally centered around her getting up in the middle of night to go to the bathroom and she has been a little bit disoriented still.  She does report occasional dizziness and anxiety usually when she is fatigued or over stimulated.  The Klonopin has helped quite a bit with her anxiety issues.  SOCIAL HISTORY:  The patient is married and no changes noted there.  PHYSICAL EXAMINATION:  VITAL SIGNS:  Blood pressure is 122/79, pulse 81, respiratory rate 18, and she is satting 99% on room air. GENERAL:  The patient is pleasant, alert.  She is fairly organized, although sometimes has needs some extra time for thought processing. She did not require notes today for questions and answers.  She is able to follow all simple commands with fair ease.  She walked for me today and gait pattern was normal.  We tried heel-to-toe bit, she was a little unsteady on the right ankle.  Strength is grossly 5/5.  No sensory findings today. HEART:  Regular. CHEST:  Clear.  ASSESSMENT: 1. Right frontal astrocytoma/oligodendroglioma, now undergoing     chemotherapy. 2. Right bimalleolar ankle fracture, status post open reduction and     internal fixation.  PLAN: 1. The patient is really doing quite well at this point.  I told her     to let her symptoms be her guide but she should continue to try to     push her exercise and activity levels as she can tolerate it.  It     should be ends to have symptoms of fatigue, dizziness or anxiety,     she needs to back off things a bit. 2. Stay with Klonopin 0.5 mg t.i.d. 3. Recommended appropriate  shoe wear and even use of her SMO for ankle     support. 4. We discussed driving and she does not feel that she is ready to     return to driving yet.  I stated to her that when she feels that     she is mentally in a place where she can drive.  I would recommend     a trial with family member in an empty parking lot to assess her     reaction time and speed.  We can send her for formal evaluation,     but told these are quite expensive.  If we do allow to drive     ultimately would be on a limited basis locally during the daytime. 5. I will see her back in about 6 months.     Ranelle Oyster, M.D. Electronically Signed    ZTS/Med[facility] D:  05/19/2011 11:03:54  T:  05/19/2011 12:59:54  Job #:  [id number]  cc:   Otilio Connors. Gerri Spore, M.D. Fax: [phone]  Lowella Dell, M.D. Fax: [phone]

## 2011-06-10 ENCOUNTER — Encounter: Payer: Self-pay | Admitting: Physician Assistant

## 2011-06-10 ENCOUNTER — Other Ambulatory Visit (HOSPITAL_BASED_OUTPATIENT_CLINIC_OR_DEPARTMENT_OTHER): Payer: PRIVATE HEALTH INSURANCE | Admitting: Lab

## 2011-06-10 ENCOUNTER — Ambulatory Visit (HOSPITAL_BASED_OUTPATIENT_CLINIC_OR_DEPARTMENT_OTHER): Payer: PRIVATE HEALTH INSURANCE | Admitting: Physician Assistant

## 2011-06-10 ENCOUNTER — Telehealth: Payer: Self-pay | Admitting: *Deleted

## 2011-06-10 ENCOUNTER — Encounter: Payer: Self-pay | Admitting: Oncology

## 2011-06-10 VITALS — BP 129/85 | HR 74 | Temp 98.4°F | Ht 64.0 in | Wt 155.2 lb

## 2011-06-10 DIAGNOSIS — C719 Malignant neoplasm of brain, unspecified: Secondary | ICD-10-CM

## 2011-06-10 LAB — CBC WITH DIFFERENTIAL/PLATELET
BASO%: 0.9 % (ref 0.0–2.0)
Basophils Absolute: 0 10*3/uL (ref 0.0–0.1)
EOS%: 3.5 % (ref 0.0–7.0)
HCT: 32.6 % — ABNORMAL LOW (ref 34.8–46.6)
HGB: 11.4 g/dL — ABNORMAL LOW (ref 11.6–15.9)
LYMPH%: 29.3 % (ref 14.0–49.7)
MCH: 33.2 pg (ref 25.1–34.0)
MCHC: 35.1 g/dL (ref 31.5–36.0)
MONO#: 0.3 10*3/uL (ref 0.1–0.9)
NEUT%: 54.1 % (ref 38.4–76.8)
Platelets: 149 10*3/uL (ref 145–400)

## 2011-06-10 LAB — COMPREHENSIVE METABOLIC PANEL
ALT: 30 U/L (ref 0–35)
BUN: 25 mg/dL — ABNORMAL HIGH (ref 6–23)
CO2: 25 mEq/L (ref 19–32)
Calcium: 9.3 mg/dL (ref 8.4–10.5)
Chloride: 107 mEq/L (ref 96–112)
Creatinine, Ser: 1.19 mg/dL — ABNORMAL HIGH (ref 0.50–1.10)
Total Bilirubin: 0.6 mg/dL (ref 0.3–1.2)

## 2011-06-10 MED ORDER — OXYCODONE HCL 5 MG PO TABS: ORAL_TABLET | ORAL | Status: DC

## 2011-06-10 MED ORDER — ZOLPIDEM TARTRATE 10 MG PO TABS: 10.0000 mg | ORAL_TABLET | Freq: Every evening | ORAL | Status: DC | PRN

## 2011-06-10 MED ORDER — DIAZEPAM 5 MG PO TABS: ORAL_TABLET | ORAL | Status: DC

## 2011-06-10 NOTE — Telephone Encounter (Signed)
gave patient appointments for 06-20-2011 and 07-01-2011 and to see dr.magrinat on the 07-09-2011 starting at 10:00
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
## 2011-06-20 ENCOUNTER — Other Ambulatory Visit (HOSPITAL_BASED_OUTPATIENT_CLINIC_OR_DEPARTMENT_OTHER): Payer: PRIVATE HEALTH INSURANCE | Admitting: Lab

## 2011-06-20 DIAGNOSIS — C719 Malignant neoplasm of brain, unspecified: Secondary | ICD-10-CM

## 2011-06-20 LAB — CBC WITH DIFFERENTIAL/PLATELET
Basophils Absolute: 0 10*3/uL (ref 0.0–0.1)
EOS%: 2.9 % (ref 0.0–7.0)
Eosinophils Absolute: 0.1 10*3/uL (ref 0.0–0.5)
HCT: 30.9 % — ABNORMAL LOW (ref 34.8–46.6)
HGB: 10.7 g/dL — ABNORMAL LOW (ref 11.6–15.9)
MCH: 33.4 pg (ref 25.1–34.0)
MCV: 95.9 fL (ref 79.5–101.0)
NEUT#: 1.7 10*3/uL (ref 1.5–6.5)
NEUT%: 54.9 % (ref 38.4–76.8)
lymph#: 1.1 10*3/uL (ref 0.9–3.3)

## 2011-06-20 LAB — COMPREHENSIVE METABOLIC PANEL
AST: 45 U/L — ABNORMAL HIGH (ref 0–37)
Albumin: 2.9 g/dL — ABNORMAL LOW (ref 3.5–5.2)
BUN: 20 mg/dL (ref 6–23)
Calcium: 9.8 mg/dL (ref 8.4–10.5)
Chloride: 108 mEq/L (ref 96–112)
Creatinine, Ser: 1.05 mg/dL (ref 0.50–1.10)
Glucose, Bld: 125 mg/dL — ABNORMAL HIGH (ref 70–99)
Potassium: 3.3 mEq/L — ABNORMAL LOW (ref 3.5–5.3)

## 2011-06-26 ENCOUNTER — Encounter: Payer: Self-pay | Admitting: *Deleted

## 2011-06-26 NOTE — Progress Notes (Signed)
rec'd confirmation from [facility] that pt rx has been shipped

## 2011-07-01 ENCOUNTER — Other Ambulatory Visit (HOSPITAL_BASED_OUTPATIENT_CLINIC_OR_DEPARTMENT_OTHER): Payer: PRIVATE HEALTH INSURANCE | Admitting: Lab

## 2011-07-01 DIAGNOSIS — C719 Malignant neoplasm of brain, unspecified: Secondary | ICD-10-CM

## 2011-07-01 LAB — COMPREHENSIVE METABOLIC PANEL
ALT: 28 U/L (ref 0–35)
AST: 39 U/L — ABNORMAL HIGH (ref 0–37)
Albumin: 3.1 g/dL — ABNORMAL LOW (ref 3.5–5.2)
CO2: 27 mEq/L (ref 19–32)
Calcium: 10.1 mg/dL (ref 8.4–10.5)
Chloride: 104 mEq/L (ref 96–112)
Creatinine, Ser: 1.41 mg/dL — ABNORMAL HIGH (ref 0.50–1.10)
Potassium: 3.3 mEq/L — ABNORMAL LOW (ref 3.5–5.3)
Total Protein: 6.3 g/dL (ref 6.0–8.3)

## 2011-07-01 LAB — CBC WITH DIFFERENTIAL/PLATELET
Basophils Absolute: 0 10*3/uL (ref 0.0–0.1)
EOS%: 1.5 % (ref 0.0–7.0)
Eosinophils Absolute: 0.1 10*3/uL (ref 0.0–0.5)
HCT: 34.2 % — ABNORMAL LOW (ref 34.8–46.6)
HGB: 11.9 g/dL (ref 11.6–15.9)
MCH: 33.3 pg (ref 25.1–34.0)
MONO#: 0.4 10*3/uL (ref 0.1–0.9)
NEUT#: 2 10*3/uL (ref 1.5–6.5)
NEUT%: 56.3 % (ref 38.4–76.8)
RDW: 14.4 % (ref 11.2–14.5)
WBC: 3.5 10*3/uL — ABNORMAL LOW (ref 3.9–10.3)
lymph#: 1 10*3/uL (ref 0.9–3.3)

## 2011-07-02 ENCOUNTER — Other Ambulatory Visit: Payer: Self-pay | Admitting: *Deleted

## 2011-07-02 MED ORDER — POTASSIUM CHLORIDE 20 MEQ PO PACK: 20.0000 meq | PACK | Freq: Every day | ORAL | Status: DC

## 2011-07-07 ENCOUNTER — Other Ambulatory Visit (HOSPITAL_COMMUNITY): Payer: PRIVATE HEALTH INSURANCE

## 2011-07-09 ENCOUNTER — Other Ambulatory Visit: Payer: PRIVATE HEALTH INSURANCE | Admitting: Lab

## 2011-07-09 ENCOUNTER — Ambulatory Visit: Payer: PRIVATE HEALTH INSURANCE | Admitting: Oncology

## 2011-07-14 ENCOUNTER — Other Ambulatory Visit: Payer: Self-pay | Admitting: *Deleted

## 2011-07-15 ENCOUNTER — Other Ambulatory Visit: Payer: Self-pay | Admitting: *Deleted

## 2011-07-15 ENCOUNTER — Telehealth: Payer: Self-pay | Admitting: *Deleted

## 2011-07-15 DIAGNOSIS — C719 Malignant neoplasm of brain, unspecified: Secondary | ICD-10-CM

## 2011-07-16 ENCOUNTER — Other Ambulatory Visit: Payer: Self-pay | Admitting: *Deleted

## 2011-07-16 DIAGNOSIS — C719 Malignant neoplasm of brain, unspecified: Secondary | ICD-10-CM

## 2011-07-18 ENCOUNTER — Other Ambulatory Visit: Payer: PRIVATE HEALTH INSURANCE

## 2011-07-23 ENCOUNTER — Ambulatory Visit (HOSPITAL_COMMUNITY)
Admission: RE | Admit: 2011-07-23 | Discharge: 2011-07-23 | Disposition: A | Discharge status: Home / Self Care | Payer: PRIVATE HEALTH INSURANCE | Source: Ambulatory Visit | Attending: Oncology | Admitting: Oncology

## 2011-07-23 DIAGNOSIS — Z923 Personal history of irradiation: Secondary | ICD-10-CM | POA: Insufficient documentation

## 2011-07-23 DIAGNOSIS — C719 Malignant neoplasm of brain, unspecified: Secondary | ICD-10-CM | POA: Insufficient documentation

## 2011-07-23 MED ORDER — GADOBENATE DIMEGLUMINE 529 MG/ML IV SOLN
7.0000 mL | Freq: Once | INTRAVENOUS | Status: AC | PRN
  Administered 2011-07-23: 7 mL via INTRAVENOUS

## 2011-07-24 NOTE — Telephone Encounter (Signed)
No entry 

## 2011-07-28 ENCOUNTER — Other Ambulatory Visit: Payer: Self-pay | Admitting: *Deleted

## 2011-07-29 ENCOUNTER — Other Ambulatory Visit: Payer: Self-pay | Admitting: Oncology

## 2011-07-29 ENCOUNTER — Ambulatory Visit (HOSPITAL_BASED_OUTPATIENT_CLINIC_OR_DEPARTMENT_OTHER): Payer: PRIVATE HEALTH INSURANCE | Admitting: Oncology

## 2011-07-29 ENCOUNTER — Ambulatory Visit (HOSPITAL_BASED_OUTPATIENT_CLINIC_OR_DEPARTMENT_OTHER): Payer: PRIVATE HEALTH INSURANCE

## 2011-07-29 ENCOUNTER — Telehealth: Payer: Self-pay | Admitting: Oncology

## 2011-07-29 VITALS — BP 130/85 | HR 91 | Temp 98.6°F | Ht 64.0 in | Wt 151.0 lb

## 2011-07-29 DIAGNOSIS — C713 Malignant neoplasm of parietal lobe: Secondary | ICD-10-CM

## 2011-07-29 DIAGNOSIS — C719 Malignant neoplasm of brain, unspecified: Secondary | ICD-10-CM

## 2011-07-29 LAB — CBC WITH DIFFERENTIAL/PLATELET
BASO%: 0 % (ref 0.0–2.0)
Eosinophils Absolute: 0 10*3/uL (ref 0.0–0.5)
LYMPH%: 31.8 % (ref 14.0–49.7)
MCHC: 35.6 g/dL (ref 31.5–36.0)
MONO#: 0.2 10*3/uL (ref 0.1–0.9)
NEUT#: 1.4 10*3/uL — ABNORMAL LOW (ref 1.5–6.5)
RBC: 3.29 10*6/uL — ABNORMAL LOW (ref 3.70–5.45)
RDW: 14.1 % (ref 11.2–14.5)
WBC: 2.4 10*3/uL — ABNORMAL LOW (ref 3.9–10.3)
lymph#: 0.8 10*3/uL — ABNORMAL LOW (ref 0.9–3.3)
nRBC: 0 % (ref 0–0)

## 2011-07-29 LAB — COMPREHENSIVE METABOLIC PANEL
ALT: 18 U/L (ref 0–35)
AST: 34 U/L (ref 0–37)
CO2: 27 mEq/L (ref 19–32)
Calcium: 10.5 mg/dL (ref 8.4–10.5)
Chloride: 103 mEq/L (ref 96–112)
Creatinine, Ser: 1.05 mg/dL (ref 0.50–1.10)
Sodium: 138 mEq/L (ref 135–145)
Total Protein: 6.4 g/dL (ref 6.0–8.3)
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
## 2011-07-29 NOTE — Telephone Encounter (Signed)
gve the pt her march 2013 appt calendar 

## 2011-08-01 ENCOUNTER — Telehealth: Payer: Self-pay | Admitting: *Deleted

## 2011-08-01 NOTE — Telephone Encounter (Signed)
This RN left 2nd message on identified VM per noted plt level and recommendation per MD to hold Temadar.  Message left on 07/29/2011 and then again today.  This RN called to work number per demographics and was informed pt is no longer working there.

## 2011-08-04 ENCOUNTER — Other Ambulatory Visit: Payer: Self-pay | Admitting: *Deleted

## 2011-08-04 DIAGNOSIS — C719 Malignant neoplasm of brain, unspecified: Secondary | ICD-10-CM

## 2011-08-05 ENCOUNTER — Other Ambulatory Visit (HOSPITAL_BASED_OUTPATIENT_CLINIC_OR_DEPARTMENT_OTHER): Payer: PRIVATE HEALTH INSURANCE | Admitting: Lab

## 2011-08-05 DIAGNOSIS — C719 Malignant neoplasm of brain, unspecified: Secondary | ICD-10-CM

## 2011-08-05 LAB — CBC WITH DIFFERENTIAL/PLATELET
BASO%: 0.5 % (ref 0.0–2.0)
EOS%: 1.7 % (ref 0.0–7.0)
HCT: 31 % — ABNORMAL LOW (ref 34.8–46.6)
LYMPH%: 34.9 % (ref 14.0–49.7)
MCH: 33.5 pg (ref 25.1–34.0)
MCHC: 34.9 g/dL (ref 31.5–36.0)
NEUT%: 53.6 % (ref 38.4–76.8)
Platelets: 171 10*3/uL (ref 145–400)
RBC: 3.23 10*6/uL — ABNORMAL LOW (ref 3.70–5.45)
WBC: 1.4 10*3/uL — ABNORMAL LOW (ref 3.9–10.3)

## 2011-08-05 LAB — COMPREHENSIVE METABOLIC PANEL
ALT: 15 U/L (ref 0–35)
AST: 33 U/L (ref 0–37)
Alkaline Phosphatase: 93 U/L (ref 39–117)
Creatinine, Ser: 1.14 mg/dL — ABNORMAL HIGH (ref 0.50–1.10)
Sodium: 138 mEq/L (ref 135–145)
Total Bilirubin: 0.7 mg/dL (ref 0.3–1.2)
Total Protein: 6.1 g/dL (ref 6.0–8.3)

## 2011-08-06 ENCOUNTER — Encounter: Payer: Self-pay | Admitting: *Deleted

## 2011-08-06 NOTE — Progress Notes (Unsigned)
Per AB, notified pt to continue to "hold" Temodar until next labs on 08/11/11

## 2011-08-11 ENCOUNTER — Other Ambulatory Visit: Payer: PRIVATE HEALTH INSURANCE | Admitting: Lab

## 2011-08-12 ENCOUNTER — Telehealth: Payer: Self-pay | Admitting: *Deleted

## 2011-08-12 NOTE — Telephone Encounter (Signed)
This RN spoke with pt per need to verify dose of dilantin and pt compliance.  Per pt she read bottle as noted on med list - dilantin 200mg  1 twice a day. Pt did state she does not remember to take it.  This RN discussed with pt need to be compliant for benefit as well as strategy of placing in a place that she does something habitually to help remind her to take medication.  [name] verbalized understanding.

## 2011-08-18 ENCOUNTER — Other Ambulatory Visit: Payer: Self-pay | Admitting: Oncology

## 2011-08-18 ENCOUNTER — Telehealth: Payer: Self-pay | Admitting: *Deleted
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
## 2011-08-18 NOTE — Progress Notes (Signed)
We received a call this morning from a Daniyla's father very concerned because she felt she was depressed and might hurt herself. We do not have him as someone we can discuss Timara's situation with, so I called her husband came, who works for the [employer] in [place] in the parts department Rosanne Ashing went home for lunch and just call me back. He says Peyton's seems fine. She was home watching TV. He tells me that this weekend his band played and she "just got Gus it up and came outside for the nice weather" and listen to them for a couple of hours. He has not heard anything estranged from her or anything that would suggest to him that she is suicidal. He suggested to her that she talk to her father and a see if of she can reassure him regarding how she is doing. I did let him know that if anything unusual were to happen we're available and we can see Norita on an immediate basis if necessary.

## 2011-08-25 ENCOUNTER — Ambulatory Visit (HOSPITAL_BASED_OUTPATIENT_CLINIC_OR_DEPARTMENT_OTHER): Payer: PRIVATE HEALTH INSURANCE | Admitting: Physician Assistant

## 2011-08-25 ENCOUNTER — Encounter: Payer: Self-pay | Admitting: Physician Assistant

## 2011-08-25 ENCOUNTER — Telehealth: Payer: Self-pay | Admitting: Oncology

## 2011-08-25 ENCOUNTER — Other Ambulatory Visit: Payer: PRIVATE HEALTH INSURANCE | Admitting: Lab

## 2011-08-25 VITALS — BP 124/85 | HR 78 | Temp 98.1°F | Ht 64.0 in | Wt 150.7 lb

## 2011-08-25 DIAGNOSIS — C719 Malignant neoplasm of brain, unspecified: Secondary | ICD-10-CM

## 2011-08-25 LAB — CBC WITH DIFFERENTIAL/PLATELET
Basophils Absolute: 0 10*3/uL (ref 0.0–0.1)
Eosinophils Absolute: 0 10*3/uL (ref 0.0–0.5)
HCT: 34.1 % — ABNORMAL LOW (ref 34.8–46.6)
HGB: 11.8 g/dL (ref 11.6–15.9)
LYMPH%: 21.4 % (ref 14.0–49.7)
MCV: 99.5 fL (ref 79.5–101.0)
MONO%: 14.9 % — ABNORMAL HIGH (ref 0.0–14.0)
NEUT#: 1.4 10*3/uL — ABNORMAL LOW (ref 1.5–6.5)
NEUT%: 61.5 % (ref 38.4–76.8)
Platelets: 119 10*3/uL — ABNORMAL LOW (ref 145–400)

## 2011-08-25 LAB — COMPREHENSIVE METABOLIC PANEL
Alkaline Phosphatase: 109 U/L (ref 39–117)
BUN: 14 mg/dL (ref 6–23)
Glucose, Bld: 103 mg/dL — ABNORMAL HIGH (ref 70–99)
Total Bilirubin: 1.1 mg/dL (ref 0.3–1.2)

## 2011-08-25 LAB — PHENYTOIN LEVEL, TOTAL: Phenytoin Lvl: 2.5 ug/mL — ABNORMAL LOW (ref 10.0–20.0)

## 2011-08-25 MED ORDER — OXYCODONE HCL 5 MG PO TABS: ORAL_TABLET | ORAL | Status: DC

## 2011-08-25 MED ORDER — ZOLPIDEM TARTRATE 10 MG PO TABS: 10.0000 mg | ORAL_TABLET | Freq: Every evening | ORAL | Status: DC | PRN

## 2011-08-25 NOTE — Telephone Encounter (Signed)
gve the pt her April,may 2013 appts along with the mri appt
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
## 2011-08-26 ENCOUNTER — Telehealth: Payer: Self-pay | Admitting: *Deleted

## 2011-08-26 NOTE — Telephone Encounter (Signed)
This RN called pt and discussed need to increase current dose of dilantin from 200mg  daily to 400mg  daily.  Pt has 100mg  tabs and verbalized understanding of taking 2 tabs in am and pm.

## 2011-08-28 ENCOUNTER — Encounter: Payer: Self-pay | Admitting: *Deleted

## 2011-08-28 NOTE — Progress Notes (Signed)
RECEIVED A FAX FROM [facility] CONCERNING A CONFIRMATION OF PRESCRIPTION SHIPMENT FOR TEMODAR. 

## 2011-08-29 ENCOUNTER — Encounter (HOSPITAL_COMMUNITY): Payer: Self-pay | Admitting: Emergency Medicine

## 2011-08-29 ENCOUNTER — Emergency Department (HOSPITAL_COMMUNITY)
Admission: EM | Admit: 2011-08-29 | Discharge: 2011-08-30 | Disposition: A | Discharge status: Home / Self Care | Payer: PRIVATE HEALTH INSURANCE | Attending: Emergency Medicine | Admitting: Emergency Medicine

## 2011-08-29 DIAGNOSIS — W1809XA Striking against other object with subsequent fall, initial encounter: Secondary | ICD-10-CM | POA: Insufficient documentation

## 2011-08-29 DIAGNOSIS — Y9229 Other specified public building as the place of occurrence of the external cause: Secondary | ICD-10-CM | POA: Insufficient documentation

## 2011-08-29 DIAGNOSIS — M5030 Other cervical disc degeneration, unspecified cervical region: Secondary | ICD-10-CM | POA: Insufficient documentation

## 2011-08-29 DIAGNOSIS — Z7289 Other problems related to lifestyle: Secondary | ICD-10-CM | POA: Insufficient documentation

## 2011-08-29 DIAGNOSIS — Z789 Other specified health status: Secondary | ICD-10-CM

## 2011-08-29 DIAGNOSIS — S0990XA Unspecified injury of head, initial encounter: Secondary | ICD-10-CM | POA: Insufficient documentation

## 2011-08-29 DIAGNOSIS — S0003XA Contusion of scalp, initial encounter: Secondary | ICD-10-CM | POA: Insufficient documentation

## 2011-08-29 DIAGNOSIS — C719 Malignant neoplasm of brain, unspecified: Secondary | ICD-10-CM | POA: Insufficient documentation

## 2011-08-29 NOTE — ED Notes (Signed)
Patient on Chemo for Brain CA.

## 2011-08-29 NOTE — ED Notes (Signed)
BP 155/105 with CBG 87 at scene.
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
## 2011-08-30 ENCOUNTER — Emergency Department (HOSPITAL_COMMUNITY): Payer: PRIVATE HEALTH INSURANCE

## 2011-08-30 LAB — CBC
HCT: 30.7 % — ABNORMAL LOW (ref 36.0–46.0)
Hemoglobin: 11.3 g/dL — ABNORMAL LOW (ref 12.0–15.0)
MCH: 34.5 pg — ABNORMAL HIGH (ref 26.0–34.0)
MCHC: 36.8 g/dL — ABNORMAL HIGH (ref 30.0–36.0)
MCV: 93.6 fL (ref 78.0–100.0)
Platelets: 154 K/uL (ref 150–400)
RBC: 3.28 MIL/uL — ABNORMAL LOW (ref 3.87–5.11)
RDW: 14.8 % (ref 11.5–15.5)
WBC: 2.9 K/uL — ABNORMAL LOW (ref 4.0–10.5)

## 2011-08-30 LAB — BASIC METABOLIC PANEL WITH GFR
BUN: 15 mg/dL (ref 6–23)
CO2: 23 meq/L (ref 19–32)
Calcium: 9.3 mg/dL (ref 8.4–10.5)
Chloride: 97 meq/L (ref 96–112)
Creatinine, Ser: 1.04 mg/dL (ref 0.50–1.10)
GFR calc Af Amer: 70 mL/min — ABNORMAL LOW
GFR calc non Af Amer: 60 mL/min — ABNORMAL LOW
Glucose, Bld: 86 mg/dL (ref 70–99)
Potassium: 3.8 meq/L (ref 3.5–5.1)
Sodium: 132 meq/L — ABNORMAL LOW (ref 135–145)

## 2011-08-30 LAB — ETHANOL: Alcohol, Ethyl (B): 324 mg/dL — ABNORMAL HIGH (ref 0–11)

## 2011-08-30 LAB — PHENYTOIN LEVEL, TOTAL: Phenytoin Lvl: <2.5 ug/mL — ABNORMAL LOW (ref 10.0–20.0)

## 2011-08-30 LAB — APTT: aPTT: 25 seconds (ref 24–37)

## 2011-08-30 LAB — DIFFERENTIAL
Eosinophils Absolute: 0 10*3/uL (ref 0.0–0.7)
Lymphocytes Relative: 37 % (ref 12–46)
Lymphs Abs: 1.1 10*3/uL (ref 0.7–4.0)
Monocytes Relative: 7 % (ref 3–12)
Neutrophils Relative %: 54 % (ref 43–77)

## 2011-08-30 LAB — PROTIME-INR: INR: 0.94 (ref 0.00–1.49)

## 2011-08-30 MED ORDER — TETANUS-DIPHTH-ACELL PERTUSSIS 5-2.5-18.5 LF-MCG/0.5 IM SUSP
0.5000 mL | Freq: Once | INTRAMUSCULAR | Status: AC
  Administered 2011-08-30: 0.5 mL via INTRAMUSCULAR
  Filled 2011-08-30: qty 0.5

## 2011-08-30 MED ORDER — SODIUM CHLORIDE 0.9 % IV BOLUS (SEPSIS): 1000.0000 mL | Freq: Once | INTRAVENOUS | Status: DC

## 2011-08-30 NOTE — ED Notes (Signed)
Reported off to [facility].

## 2011-08-30 NOTE — ED Notes (Signed)
C Collar patient presented in changed to [facility] collar.

## 2011-08-30 NOTE — ED Notes (Signed)
Log rolled with assist and removed from backboard.  C Collar remains in place.
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
## 2011-08-30 NOTE — Discharge Instructions (Signed)
Concussion and Brain Injury A blow or jolt to the head can disrupt the normal function of the brain. This type of brain injury is often called a "concussion" or a "closed head injury." Concussions are usually not life-threatening. Even so, the effects of a concussion can be serious.  CAUSES  A concussion is caused by a blunt blow to the head. The blow might be direct or indirect as described below.  Direct blow (running into another player during a soccer game, being hit in a fight, or hitting your head on a hard surface).   Indirect blow (when your head moves rapidly and violently back and forth like in a car crash).  SYMPTOMS  The brain is very complex. Every head injury is different. Some symptoms may appear right away. Other symptoms may not show up for days or weeks after the concussion. The signs of concussion can be hard to notice. Early on, problems may be missed by patients, family members, and caregivers. You may look fine even though you are acting or feeling differently.  These symptoms are usually temporary, but may last for days, weeks, or even longer. Symptoms include:  Mild headaches that will not go away.   Having more trouble than usual with:   Remembering things.   Paying attention or concentrating.   Organizing daily tasks.   Making decisions and solving problems.   Slowness in thinking, acting, speaking, or reading.   Getting lost or easily confused.   Feeling tired all the time or lacking energy (fatigue).   Feeling drowsy.   Sleep disturbances.   Sleeping more than usual.   Sleeping less than usual.   Trouble falling asleep.   Trouble sleeping (insomnia).   Loss of balance or feeling lightheaded or dizzy.   Nausea or vomiting.   Numbness or tingling.   Increased sensitivity to:   Sounds.   Lights.   Distractions.  Other symptoms might include:  Vision problems or eyes that tire easily.   Diminished sense of taste or smell.   Ringing  in the ears.   Mood changes such as feeling sad, anxious, or listless.   Becoming easily irritated or angry for little or no reason.   Lack of motivation.  DIAGNOSIS  Your caregiver can usually diagnose a concussion or mild brain injury based on your description of your injury and your symptoms.  Your evaluation might include:  A brain scan to look for signs of injury to the brain. Even if the test shows no injury, you may still have a concussion.   Blood tests to be sure other problems are not present.  TREATMENT   People with a concussion need to be examined and evaluated. Most people with concussions are treated in an emergency department, urgent care, or clinic. Some people must stay in the hospital overnight for further treatment.   Your caregiver will send you home with important instructions to follow. Be sure to carefully follow them.   Tell your caregiver if you are already taking any medicines (prescription, over-the-counter, or natural remedies), or if you are drinking alcohol or taking illegal drugs. Also, talk with your caregiver if you are taking blood thinners (anticoagulants) or aspirin. These drugs may increase your chances of complications. All of this is important information that may affect treatment.   Only take over-the-counter or prescription medicines for pain, discomfort, or fever as directed by your caregiver.  PROGNOSIS  How fast people recover from brain injury varies from person to person.  Although most people have a good recovery, how quickly they improve depends on many factors. These factors include how severe their concussion was, what part of the brain was injured, their age, and how healthy they were before the concussion.  Because all head injuries are different, so is recovery. Most people with mild injuries recover fully. Recovery can take time. In general, recovery is slower in older persons. Also, persons who have had a concussion in the past or have  other medical problems may find that it takes longer to recover from their current injury. Anxiety and depression may also make it harder to adjust to the symptoms of brain injury. HOME CARE INSTRUCTIONS  Return to your normal activities slowly, not all at once. You must give your body and brain enough time for recovery.  Get plenty of sleep at night, and rest during the day. Rest helps the brain to heal.   Avoid staying up late at night.   Keep the same bedtime hours on weekends and weekdays.   Take daytime naps or rest breaks when you feel tired.   Limit activities that require a lot of thought or concentration (brain or cognitive rest). This includes:   Homework or job-related work.   Watching TV.   Computer work.   Avoid activities that could lead to a second brain injury, such as contact or recreational sports, until your caregiver says it is okay. Even after your brain injury has healed, you should protect yourself from having another concussion.   Ask your caregiver when you can return to your normal activities such as driving, bicycling, or operating heavy equipment. Your ability to react may be slower after a brain injury.   Talk with your caregiver about when you can return to work or school.   Inform your teachers, school nurse, school counselor, coach, Event organiser, or work Production designer, theatre/television/film about your injury, symptoms, and restrictions. They should be instructed to report:   Increased problems with attention or concentration.   Increased problems remembering or learning new information.   Increased time needed to complete tasks or assignments.   Increased irritability or decreased ability to cope with stress.   Increased symptoms.   Take only those medicines that your caregiver has approved.   Do not drink alcohol until your caregiver says you are well enough to do so. Alcohol and certain other drugs may slow your recovery and can put you at risk of further injury.    If it is harder than usual to remember things, write them down.   If you are easily distracted, try to do one thing at a time. For example, do not try to watch TV while fixing dinner.   Talk with family members or close friends when making important decisions.   Keep all follow-up appointments. Repeated evaluation of your symptoms is recommended for your recovery.  PREVENTION  Protect your head from future injury. It is very important to avoid another head or brain injury before you have recovered. In rare cases, another injury has lead to permanent brain damage, brain swelling, or death. Avoid injuries by using:  Seatbelts when riding in a car.   Alcohol only in moderation.   A helmet when biking, skiing, skateboarding, skating, or doing similar activities.   Safety measures in your home.   Remove clutter and tripping hazards from floors and stairways.   Use grab bars in bathrooms and handrails by stairs.   Place non-slip mats on floors and in bathtubs.  Improve lighting in dim areas.  SEEK MEDICAL CARE IF:  A head injury can cause lingering symptoms. You should seek medical care if you have any of the following symptoms for more than 3 weeks after your injury or are planning to return to sports:  Chronic headaches.   Dizziness or balance problems.   Nausea.   Vision problems.   Increased sensitivity to noise or light.   Depression or mood swings.   Anxiety or irritability.   Memory problems.   Difficulty concentrating or paying attention.   Sleep problems.   Feeling tired all the time.  SEEK IMMEDIATE MEDICAL CARE IF:  You have had a blow or jolt to the head and you (or your family or friends) notice:  Severe or worsening headaches.   Weakness (even if only in one hand or one leg or one part of the face), numbness, or decreased coordination.   Repeated vomiting.   Increased sleepiness or passing out.   One black center of the eye (pupil) is larger  than the other.   Convulsions (seizures).   Slurred speech.   Increasing confusion, restlessness, agitation, or irritability.   Lack of ability to recognize people or places.   Neck pain.   Difficulty being awakened.   Unusual behavior changes.   Loss of consciousness.  Older adults with a brain injury may have a higher risk of serious complications such as a blood clot on the brain. Headaches that get worse or an increase in confusion are signs of this complication. If these signs occur, see a caregiver right away. MAKE SURE YOU:   Understand these instructions.   Will watch your condition.   Will get help right away if you are not doing well or get worse.  FOR MORE INFORMATION  Several groups help people with brain injury and their families. They provide information and put people in touch with local resources. These include support groups, rehabilitation services, and a variety of health care professionals. Among these groups, the Brain Injury Association (BIA, www.biausa.org) has a Secretary/administrator that gathers scientific and educational information and works on a national level to help people with brain injury.  Document Released: 08/09/2003 Document Revised: 05/08/2011 Document Reviewed: 01/05/2008 [facility] Patient Information 2012 [place], [place].

## 2011-08-30 NOTE — ED Notes (Signed)
Ambulated patient to bathroom--very unsteady and almost missed commode seat and sat on floor.  Abrasion to back left scalp cleansed with H2O2.

## 2011-08-30 NOTE — ED Notes (Signed)
Set up with meal tray now.

## 2011-09-03 ENCOUNTER — Other Ambulatory Visit (HOSPITAL_BASED_OUTPATIENT_CLINIC_OR_DEPARTMENT_OTHER): Payer: PRIVATE HEALTH INSURANCE | Admitting: Lab

## 2011-09-03 DIAGNOSIS — C719 Malignant neoplasm of brain, unspecified: Secondary | ICD-10-CM

## 2011-09-03 LAB — CBC WITH DIFFERENTIAL/PLATELET
BASO%: 0.9 % (ref 0.0–2.0)
Eosinophils Absolute: 0.1 10*3/uL (ref 0.0–0.5)
LYMPH%: 19.4 % (ref 14.0–49.7)
MCHC: 34.6 g/dL (ref 31.5–36.0)
MONO#: 0.3 10*3/uL (ref 0.1–0.9)
NEUT#: 1.7 10*3/uL (ref 1.5–6.5)
RBC: 3.09 10*6/uL — ABNORMAL LOW (ref 3.70–5.45)
RDW: 15.9 % — ABNORMAL HIGH (ref 11.2–14.5)
WBC: 2.6 10*3/uL — ABNORMAL LOW (ref 3.9–10.3)
lymph#: 0.5 10*3/uL — ABNORMAL LOW (ref 0.9–3.3)
nRBC: 0 % (ref 0–0)

## 2011-09-03 LAB — PHENYTOIN LEVEL, TOTAL: Phenytoin Lvl: 6.6 ug/mL — ABNORMAL LOW (ref 10.0–20.0)

## 2011-09-11 ENCOUNTER — Other Ambulatory Visit (HOSPITAL_BASED_OUTPATIENT_CLINIC_OR_DEPARTMENT_OTHER): Payer: PRIVATE HEALTH INSURANCE | Admitting: Lab

## 2011-09-11 ENCOUNTER — Encounter: Payer: Self-pay | Admitting: Radiation Oncology

## 2011-09-11 ENCOUNTER — Ambulatory Visit
Admission: RE | Admit: 2011-09-11 | Discharge: 2011-09-11 | Disposition: A | Discharge status: Home / Self Care | Payer: PRIVATE HEALTH INSURANCE | Source: Ambulatory Visit | Attending: Radiation Oncology | Admitting: Radiation Oncology

## 2011-09-11 VITALS — BP 136/86 | HR 81 | Temp 98.7°F | Resp 20 | Wt 153.2 lb

## 2011-09-11 DIAGNOSIS — C719 Malignant neoplasm of brain, unspecified: Secondary | ICD-10-CM

## 2011-09-11 LAB — CBC WITH DIFFERENTIAL/PLATELET
EOS%: 4.2 % (ref 0.0–7.0)
Eosinophils Absolute: 0.1 10*3/uL (ref 0.0–0.5)
MCV: 98.7 fL (ref 79.5–101.0)
MONO%: 10.3 % (ref 0.0–14.0)
NEUT#: 2.2 10*3/uL (ref 1.5–6.5)
RBC: 3.1 10*6/uL — ABNORMAL LOW (ref 3.70–5.45)
RDW: 15 % — ABNORMAL HIGH (ref 11.2–14.5)
WBC: 3.4 10*3/uL — ABNORMAL LOW (ref 3.9–10.3)
lymph#: 0.7 10*3/uL — ABNORMAL LOW (ref 0.9–3.3)
nRBC: 0 % (ref 0–0)

## 2011-09-11 NOTE — Progress Notes (Signed)
Follow up  Radiation tx to right posterior parietal lobe=09/23/10-11/07/10 Pt alert oriented x 3, unstready gait, forgot cane, right  hand tremors pt states"1 month now, worse at times", fell 08/30/11 from bar stool at restaaurant with friends who say they witnessed a seizure<ct head done On 2mg  decadon every other day and on dilantin, no thrush seen on tongue, has headache whre knot felt left side of back of her head size golf ball, pain level 6 now, last pain medication taken last night, , pt flat affect,  2:15 PM

## 2011-09-11 NOTE — Progress Notes (Signed)
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  She has a right hand tremor and remains unsteady with gait.  She still has minor expressive aphasia.  She is continuing adjuvant Temodar.                              ALLERGIES:  is allergic to vasotec.  Meds: Current Outpatient Prescriptions  Medication Sig Dispense Refill  . cholecalciferol (VITAMIN D) 400 UNITS TABS Take 1,000 Units by mouth daily.        . clonazePAM (KLONOPIN) 0.5 MG tablet Take 0.5 mg by mouth 3 (three) times daily as needed. For anxiety      . dexamethasone (DECADRON) 2 MG tablet Take 2 mg by mouth every other day.        . famotidine (PEPCID) 20 MG tablet Take 20 mg by mouth 2 (two) times daily.        Marland Kitchen FLUoxetine (PROZAC) 20 MG tablet Take 30 mg by mouth 2 (two) times daily.        . folic acid (FOLVITE) 1 MG tablet Take 1 mg by mouth daily.        . Multiple Vitamin (MULTIVITAMIN) tablet Take 1 tablet by mouth daily.        Marland Kitchen oxyCODONE (OXY IR/ROXICODONE) 5 MG immediate release tablet 1-2 tabs po q 4 hr prn pain  60 tablet  0  . phenytoin (DILANTIN) 100 MG ER capsule Take 200 mg by mouth 2 (two) times daily.      . potassium chloride (KLOR-CON) 20 MEQ packet Take 20 mEq by mouth daily.  30 tablet  3  . prochlorperazine (COMPAZINE) 10 MG tablet Take 10 mg by mouth every 6 (six) hours as needed. For nausea      . temozolomide (TEMODAR) 100 MG capsule Take 300 mg by mouth daily. May take on an empty stomach or at bedtime to decrease nausea & vomiting. Take x 5 days of every month.       . traMADol (ULTRAM) 50 MG tablet Take 50 mg by mouth every 6 (six)  hours as needed. Maximum dose= 8 tablets per day       . zolpidem (AMBIEN) 10 MG tablet Take 1 tablet (10 mg total) by mouth at bedtime as needed for sleep.  30 tablet  0  . diazepam (VALIUM) 5 MG tablet Take 1-2 tabs PO, 30 minutes prior to MRI  2 tablet  0  . ferrous sulfate 325 (65 FE) MG tablet Take 325 mg by mouth daily with breakfast.          Physical Findings: The patient is in no acute distress. Patient is alert and oriented.  weight is 153 lb 3.2 oz (69.491 kg). Her oral temperature is 98.7 F (37.1 C). Her blood pressure is 136/86 and her pulse is 81. Her respiration is 20. Marland Kitchen  Hair thin on right scalp.  Head [place]/AT except left hematoma from recent fall.  EOMI, CN's grossly intact, MS 5/5 with no pronator drift.  Gait slow and steady.  Speech slow  but fluent.  Lab Findings: Lab Results  Component Value Date   WBC 2.6* 09/03/2011   HGB 10.6* 09/03/2011   HCT 30.7* 09/03/2011   MCV 99.3 09/03/2011   PLT 161 09/03/2011    Radiographic Findings: Ct Head Wo Contrast  08/30/2011  *RADIOLOGY REPORT*  Clinical Data: Headache on top of head after fall.  History of astrocytoma brain tumor.  CT HEAD WITHOUT CONTRAST  Technique:  Contiguous axial images were obtained from the base of the skull through the vertex without contrast.  Comparison: CT head 05/02/2011.  MRI brain 07/23/2011  Findings: Diffuse bilateral cerebral atrophy.  Mild ventricular dilatation likely representing central atrophy.  Postoperative changes with a right parietal craniotomy.  Underlying cystic collection in the brain parenchyma measures about 2.5 cm diameter, similar to the appearance on previous MRI.  No developing mass effect.  No midline shift.  No abnormal extra-axial fluid collections.  The gray-white matter junctions are distinct.  The basal cisterns are not effaced.  No evidence of acute intracranial hemorrhage.  There is a moderately large subcutaneous scalp hematoma over the left posterior parietal region.  No underlying  skull fractures identified.  Visualized paranasal sinuses demonstrate mild mucosal membrane thickening consistent with chronic inflammatory process.  No acute air-fluid levels.  IMPRESSION: Postoperative changes in the right posterior parietal region with parenchymal cysts.  This appears stable since previous MRI. Subcutaneous soft tissue scalp hematoma over the left posterior parietal region without underlying skull fracture.  No acute intracranial abnormalities identified.  Original Report Authenticated By: Marlon Pel, M.D.   Ct Cervical Spine Wo Contrast  08/30/2011  *RADIOLOGY REPORT*  Clinical Data: Head injury after fall  CT CERVICAL SPINE WITHOUT CONTRAST  Technique:  Multidetector CT imaging of the cervical spine was performed. Multiplanar CT image reconstructions were also generated.  Comparison: CT cervical spine 08/15/2010  Findings: The normal alignment of the cervical vertebrae and facet joints.  Degenerative changes throughout the cervical spine with narrowed cervical interspaces and associated endplate hypertrophic changes.  No vertebral compression deformities.  No prevertebral soft tissue swelling.  Lateral masses of C1 appear symmetrical. The odontoid process appears intact.  Bone cortex and trabecular architecture appear intact.  No focal bone lesion or bone destruction.  No paraspinal soft tissue infiltration.  No significant change since previous study.  IMPRESSION: Degenerative changes in the cervical spine.  No displaced fractures identified.  Original Report Authenticated By: Marlon Pel, M.D.    Impression:  The patient is receiving adjuvant Temodar with no evidence of recurrence.  Plan:  Return in 6 months.  _____________________________________  Artist Pais. Kathrynn Running, M.D.

## 2011-09-12 LAB — PHENYTOIN LEVEL, TOTAL: Phenytoin Lvl: 4.3 ug/mL — ABNORMAL LOW (ref 10.0–20.0)

## 2011-09-22 ENCOUNTER — Ambulatory Visit (HOSPITAL_COMMUNITY)
Admission: RE | Admit: 2011-09-22 | Discharge: 2011-09-22 | Disposition: A | Discharge status: Home / Self Care | Payer: PRIVATE HEALTH INSURANCE | Source: Ambulatory Visit | Attending: Physician Assistant | Admitting: Physician Assistant

## 2011-09-22 ENCOUNTER — Telehealth: Payer: Self-pay | Admitting: *Deleted

## 2011-09-22 ENCOUNTER — Other Ambulatory Visit (HOSPITAL_BASED_OUTPATIENT_CLINIC_OR_DEPARTMENT_OTHER): Payer: PRIVATE HEALTH INSURANCE | Admitting: Lab

## 2011-09-22 ENCOUNTER — Ambulatory Visit (HOSPITAL_BASED_OUTPATIENT_CLINIC_OR_DEPARTMENT_OTHER): Payer: PRIVATE HEALTH INSURANCE | Admitting: Physician Assistant

## 2011-09-22 ENCOUNTER — Encounter: Payer: Self-pay | Admitting: Physician Assistant

## 2011-09-22 VITALS — BP 113/77 | HR 91 | Temp 98.3°F | Ht 64.0 in | Wt 154.1 lb

## 2011-09-22 DIAGNOSIS — W19XXXA Unspecified fall, initial encounter: Secondary | ICD-10-CM | POA: Insufficient documentation

## 2011-09-22 DIAGNOSIS — Z9181 History of falling: Secondary | ICD-10-CM

## 2011-09-22 DIAGNOSIS — D696 Thrombocytopenia, unspecified: Secondary | ICD-10-CM

## 2011-09-22 DIAGNOSIS — R51 Headache: Secondary | ICD-10-CM | POA: Insufficient documentation

## 2011-09-22 DIAGNOSIS — R42 Dizziness and giddiness: Secondary | ICD-10-CM | POA: Insufficient documentation

## 2011-09-22 DIAGNOSIS — Z85841 Personal history of malignant neoplasm of brain: Secondary | ICD-10-CM | POA: Insufficient documentation

## 2011-09-22 DIAGNOSIS — C713 Malignant neoplasm of parietal lobe: Secondary | ICD-10-CM

## 2011-09-22 DIAGNOSIS — C719 Malignant neoplasm of brain, unspecified: Secondary | ICD-10-CM

## 2011-09-22 DIAGNOSIS — H538 Other visual disturbances: Secondary | ICD-10-CM | POA: Insufficient documentation

## 2011-09-22 DIAGNOSIS — R110 Nausea: Secondary | ICD-10-CM | POA: Insufficient documentation

## 2011-09-22 LAB — COMPREHENSIVE METABOLIC PANEL
ALT: 11 U/L (ref 0–35)
AST: 27 U/L (ref 0–37)
Albumin: 3.1 g/dL — ABNORMAL LOW (ref 3.5–5.2)
Alkaline Phosphatase: 131 U/L — ABNORMAL HIGH (ref 39–117)
Potassium: 3.7 mEq/L (ref 3.5–5.3)
Sodium: 138 mEq/L (ref 135–145)
Total Protein: 6.4 g/dL (ref 6.0–8.3)

## 2011-09-22 LAB — CBC WITH DIFFERENTIAL/PLATELET
BASO%: 0.2 % (ref 0.0–2.0)
EOS%: 4 % (ref 0.0–7.0)
HGB: 9 g/dL — ABNORMAL LOW (ref 11.6–15.9)
MCH: 34.7 pg — ABNORMAL HIGH (ref 25.1–34.0)
MCHC: 35.4 g/dL (ref 31.5–36.0)
RDW: 14 % (ref 11.2–14.5)
WBC: 1.4 10*3/uL — ABNORMAL LOW (ref 3.9–10.3)
lymph#: 0.5 10*3/uL — ABNORMAL LOW (ref 0.9–3.3)

## 2011-09-22 NOTE — Telephone Encounter (Signed)
volunteer wheeled patient over to the radiology department for ct of the head on 09-22-2011 called dr.Ernesto Jeral Fruit gave patient appointment for 09-29-2011 at 4:15pm lab only appointment

## 2011-09-22 NOTE — Telephone Encounter (Signed)
left message for becky dr.ernesto nurse left her an voice message to inform her that the patient needs an immediate appointment with the physician  asked that she call me back and call the patient with the appointment
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
## 2011-09-23 ENCOUNTER — Telehealth: Payer: Self-pay | Admitting: *Deleted

## 2011-09-23 ENCOUNTER — Other Ambulatory Visit: Payer: Self-pay | Admitting: *Deleted

## 2011-09-23 NOTE — Telephone Encounter (Signed)
left  patient message about referral appointment for 09-23-2011 at 4:30 today

## 2011-09-23 NOTE — Telephone Encounter (Signed)
patient called back and she could not see the referral md on 09-23-2011 at 4:30 Hilda Lias spoke with the office and they will call the patient themselves to set up appointment

## 2011-09-23 NOTE — Telephone Encounter (Signed)
THIS REQUEST FOR TEMODAR WAS GIVEN TO DR.MAGRINAT'S NURSE, VAL DODD,RN.

## 2011-09-29 ENCOUNTER — Other Ambulatory Visit (HOSPITAL_BASED_OUTPATIENT_CLINIC_OR_DEPARTMENT_OTHER): Payer: PRIVATE HEALTH INSURANCE | Admitting: Lab

## 2011-09-29 DIAGNOSIS — C719 Malignant neoplasm of brain, unspecified: Secondary | ICD-10-CM

## 2011-09-29 LAB — COMPREHENSIVE METABOLIC PANEL
ALT: 17 U/L (ref 0–35)
CO2: 27 mEq/L (ref 19–32)
Creatinine, Ser: 1.1 mg/dL (ref 0.50–1.10)
Total Bilirubin: 0.8 mg/dL (ref 0.3–1.2)

## 2011-09-29 LAB — CBC WITH DIFFERENTIAL/PLATELET
BASO%: 0.3 % (ref 0.0–2.0)
Eosinophils Absolute: 0 10*3/uL (ref 0.0–0.5)
LYMPH%: 40.8 % (ref 14.0–49.7)
MCH: 34.5 pg — ABNORMAL HIGH (ref 25.1–34.0)
MCHC: 34.7 g/dL (ref 31.5–36.0)
MCV: 99.4 fL (ref 79.5–101.0)
MONO%: 8.7 % (ref 0.0–14.0)
Platelets: 179 10*3/uL (ref 145–400)
RBC: 2.59 10*6/uL — ABNORMAL LOW (ref 3.70–5.45)
nRBC: 0 % (ref 0–0)

## 2011-10-02 ENCOUNTER — Ambulatory Visit (HOSPITAL_COMMUNITY)
Admission: RE | Admit: 2011-10-02 | Discharge: 2011-10-02 | Disposition: A | Discharge status: Home / Self Care | Payer: PRIVATE HEALTH INSURANCE | Source: Ambulatory Visit | Attending: Physician Assistant | Admitting: Physician Assistant

## 2011-10-02 DIAGNOSIS — G930 Cerebral cysts: Secondary | ICD-10-CM | POA: Insufficient documentation

## 2011-10-02 DIAGNOSIS — C719 Malignant neoplasm of brain, unspecified: Secondary | ICD-10-CM

## 2011-10-02 MED ORDER — GADOBENATE DIMEGLUMINE 529 MG/ML IV SOLN
14.0000 mL | Freq: Once | INTRAVENOUS | Status: AC | PRN
  Administered 2011-10-02: 14 mL via INTRAVENOUS

## 2011-10-04 LAB — PHENYTOIN LEVEL, FREE AND TOTAL: Phenytoin, Total: <2.5 mg/L — ABNORMAL LOW (ref 10.0–20.0)

## 2011-10-07 ENCOUNTER — Ambulatory Visit: Payer: PRIVATE HEALTH INSURANCE | Admitting: Oncology

## 2011-10-07 ENCOUNTER — Other Ambulatory Visit: Payer: PRIVATE HEALTH INSURANCE | Admitting: Lab

## 2011-10-14 ENCOUNTER — Telehealth: Payer: Self-pay | Admitting: Oncology

## 2011-10-14 ENCOUNTER — Other Ambulatory Visit: Payer: Self-pay | Admitting: Oncology

## 2011-10-14 ENCOUNTER — Encounter: Payer: Self-pay | Admitting: Oncology

## 2011-10-14 DIAGNOSIS — C719 Malignant neoplasm of brain, unspecified: Secondary | ICD-10-CM

## 2011-10-14 NOTE — Telephone Encounter (Signed)
lmonvm adviisng the pt of her may appts this friday

## 2011-10-17 ENCOUNTER — Other Ambulatory Visit: Payer: PRIVATE HEALTH INSURANCE | Admitting: Lab

## 2011-10-17 ENCOUNTER — Ambulatory Visit: Payer: PRIVATE HEALTH INSURANCE | Admitting: Physician Assistant

## 2011-10-17 ENCOUNTER — Telehealth: Payer: Self-pay | Admitting: *Deleted

## 2011-10-17 ENCOUNTER — Telehealth: Payer: Self-pay | Admitting: Oncology

## 2011-10-17 NOTE — Telephone Encounter (Signed)
Called pt.  She missed today's appt.  She states she forgot.  Pt. States she is doing fine and will be able to come next Wednesday 5/22 for lab and to see Zollie Scale NP

## 2011-10-17 NOTE — Progress Notes (Signed)
[facility] today for labs and follow up visit.  Will ask schedulers to reschedule appts for next week and ask desk nurse to attempt to contact patient.  Zollie Scale, PA-C 10/17/2011

## 2011-10-17 NOTE — Telephone Encounter (Signed)
S/w the pt and she is aware of her appts on 10/22/2011@1 :45pm

## 2011-10-22 ENCOUNTER — Other Ambulatory Visit (HOSPITAL_BASED_OUTPATIENT_CLINIC_OR_DEPARTMENT_OTHER): Payer: PRIVATE HEALTH INSURANCE | Admitting: Lab

## 2011-10-22 ENCOUNTER — Encounter: Payer: Self-pay | Admitting: Physician Assistant

## 2011-10-22 ENCOUNTER — Telehealth: Payer: Self-pay | Admitting: Oncology

## 2011-10-22 ENCOUNTER — Ambulatory Visit (HOSPITAL_BASED_OUTPATIENT_CLINIC_OR_DEPARTMENT_OTHER): Payer: PRIVATE HEALTH INSURANCE | Admitting: Physician Assistant

## 2011-10-22 VITALS — BP 124/77 | HR 83 | Temp 98.2°F | Ht 64.0 in | Wt 151.3 lb

## 2011-10-22 DIAGNOSIS — R569 Unspecified convulsions: Secondary | ICD-10-CM

## 2011-10-22 DIAGNOSIS — R259 Unspecified abnormal involuntary movements: Secondary | ICD-10-CM

## 2011-10-22 DIAGNOSIS — C719 Malignant neoplasm of brain, unspecified: Secondary | ICD-10-CM

## 2011-10-22 DIAGNOSIS — C713 Malignant neoplasm of parietal lobe: Secondary | ICD-10-CM

## 2011-10-22 LAB — CBC WITH DIFFERENTIAL/PLATELET
Eosinophils Absolute: 0.1 10*3/uL (ref 0.0–0.5)
MONO#: 0.3 10*3/uL (ref 0.1–0.9)
NEUT#: 1.9 10*3/uL (ref 1.5–6.5)
Platelets: 157 10*3/uL (ref 145–400)
RBC: 3.26 10*6/uL — ABNORMAL LOW (ref 3.70–5.45)
RDW: 16.7 % — ABNORMAL HIGH (ref 11.2–14.5)
WBC: 2.9 10*3/uL — ABNORMAL LOW (ref 3.9–10.3)
lymph#: 0.7 10*3/uL — ABNORMAL LOW (ref 0.9–3.3)
nRBC: 0 % (ref 0–0)

## 2011-10-22 LAB — COMPREHENSIVE METABOLIC PANEL
ALT: 12 U/L (ref 0–35)
CO2: 26 mEq/L (ref 19–32)
Calcium: 9.8 mg/dL (ref 8.4–10.5)
Chloride: 105 mEq/L (ref 96–112)
Potassium: 4.4 mEq/L (ref 3.5–5.3)
Sodium: 139 mEq/L (ref 135–145)
Total Protein: 6 g/dL (ref 6.0–8.3)

## 2011-10-22 LAB — PHENYTOIN LEVEL, TOTAL: Phenytoin Lvl: 9.6 ug/mL — ABNORMAL LOW (ref 10.0–20.0)

## 2011-10-22 MED ORDER — ZOLPIDEM TARTRATE 10 MG PO TABS: 10.0000 mg | ORAL_TABLET | Freq: Every evening | ORAL | Status: DC | PRN

## 2011-10-22 MED ORDER — OXYCODONE HCL 5 MG PO TABS: ORAL_TABLET | ORAL | Status: DC
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
## 2011-10-22 NOTE — Telephone Encounter (Signed)
gve the pt his may,june 2013 appt calendar along with the appt to see dr Kathrynn Running

## 2011-10-23 ENCOUNTER — Telehealth: Payer: Self-pay | Admitting: Oncology

## 2011-10-23 NOTE — Telephone Encounter (Signed)
Per 2nd 5/22 pof pt needs referral to [facility]. [facility] for diane requesting appt and referral sent desk nurse to complete. Once complete referral will be faxed to [facility] w/notes.

## 2011-10-24 ENCOUNTER — Telehealth: Payer: Self-pay | Admitting: *Deleted

## 2011-10-24 NOTE — Telephone Encounter (Signed)
spoke with Ovetta from [facility] assoication she says fax over all md office notes and labs and x-ray reports and they will call the patient with the appointment fax number [phone] sent information to the medical records department

## 2011-10-28 ENCOUNTER — Other Ambulatory Visit (HOSPITAL_BASED_OUTPATIENT_CLINIC_OR_DEPARTMENT_OTHER): Payer: PRIVATE HEALTH INSURANCE | Admitting: Lab

## 2011-10-28 DIAGNOSIS — C719 Malignant neoplasm of brain, unspecified: Secondary | ICD-10-CM

## 2011-10-28 LAB — CBC WITH DIFFERENTIAL/PLATELET
Basophils Absolute: 0 10*3/uL (ref 0.0–0.1)
Eosinophils Absolute: 0.1 10*3/uL (ref 0.0–0.5)
HCT: 31.4 % — ABNORMAL LOW (ref 34.8–46.6)
HGB: 10.6 g/dL — ABNORMAL LOW (ref 11.6–15.9)
LYMPH%: 24.8 % (ref 14.0–49.7)
MCHC: 33.8 g/dL (ref 31.5–36.0)
MONO#: 0.2 10*3/uL (ref 0.1–0.9)
NEUT%: 58.7 % (ref 38.4–76.8)
Platelets: 135 10*3/uL — ABNORMAL LOW (ref 145–400)
WBC: 2 10*3/uL — ABNORMAL LOW (ref 3.9–10.3)
lymph#: 0.5 10*3/uL — ABNORMAL LOW (ref 0.9–3.3)

## 2011-10-31 ENCOUNTER — Telehealth: Payer: Self-pay | Admitting: Oncology

## 2011-10-31 NOTE — Telephone Encounter (Signed)
S/w sharon from [facility] and she stated that the pt has been called from heather from their office to schedule  An appt with the neurologist. Per sharon they have not been able to contact this pt.

## 2011-11-01 LAB — COMPREHENSIVE METABOLIC PANEL
AST: 29 U/L (ref 0–37)
Albumin: 3.4 g/dL — ABNORMAL LOW (ref 3.5–5.2)
BUN: 13 mg/dL (ref 6–23)
CO2: 28 mEq/L (ref 19–32)
Calcium: 9.4 mg/dL (ref 8.4–10.5)
Chloride: 106 mEq/L (ref 96–112)
Creatinine, Ser: 1.06 mg/dL (ref 0.50–1.10)
Glucose, Bld: 96 mg/dL (ref 70–99)
Potassium: 3.8 mEq/L (ref 3.5–5.3)

## 2011-11-01 LAB — PHENYTOIN LEVEL, FREE AND TOTAL
Phenytoin Bound: 3.5 mg/L
Phenytoin, Total: 4.2 mg/L — ABNORMAL LOW (ref 10.0–20.0)

## 2011-11-03 ENCOUNTER — Other Ambulatory Visit: Payer: Self-pay | Admitting: *Deleted

## 2011-11-04 ENCOUNTER — Other Ambulatory Visit (HOSPITAL_BASED_OUTPATIENT_CLINIC_OR_DEPARTMENT_OTHER): Payer: PRIVATE HEALTH INSURANCE | Admitting: Lab

## 2011-11-04 DIAGNOSIS — C719 Malignant neoplasm of brain, unspecified: Secondary | ICD-10-CM

## 2011-11-04 DIAGNOSIS — C713 Malignant neoplasm of parietal lobe: Secondary | ICD-10-CM

## 2011-11-04 LAB — CBC WITH DIFFERENTIAL/PLATELET
BASO%: 0.6 % (ref 0.0–2.0)
EOS%: 5 % (ref 0.0–7.0)
HCT: 31.2 % — ABNORMAL LOW (ref 34.8–46.6)
LYMPH%: 19.6 % (ref 14.0–49.7)
MCH: 34.4 pg — ABNORMAL HIGH (ref 25.1–34.0)
MCHC: 34.1 g/dL (ref 31.5–36.0)
MONO#: 0.4 10*3/uL (ref 0.1–0.9)
NEUT%: 60.7 % (ref 38.4–76.8)
RBC: 3.1 10*6/uL — ABNORMAL LOW (ref 3.70–5.45)
WBC: 2.8 10*3/uL — ABNORMAL LOW (ref 3.9–10.3)
lymph#: 0.5 10*3/uL — ABNORMAL LOW (ref 0.9–3.3)
nRBC: 0 % (ref 0–0)

## 2011-11-05 ENCOUNTER — Encounter: Payer: Self-pay | Admitting: *Deleted

## 2011-11-05 ENCOUNTER — Encounter: Payer: Self-pay | Admitting: Radiation Oncology

## 2011-11-06 ENCOUNTER — Ambulatory Visit: Payer: PRIVATE HEALTH INSURANCE | Admitting: Radiation Oncology

## 2011-11-06 ENCOUNTER — Ambulatory Visit: Payer: PRIVATE HEALTH INSURANCE

## 2011-11-11 ENCOUNTER — Other Ambulatory Visit (HOSPITAL_BASED_OUTPATIENT_CLINIC_OR_DEPARTMENT_OTHER): Payer: PRIVATE HEALTH INSURANCE | Admitting: Lab

## 2011-11-11 DIAGNOSIS — C719 Malignant neoplasm of brain, unspecified: Secondary | ICD-10-CM

## 2011-11-11 LAB — CBC WITH DIFFERENTIAL/PLATELET
EOS%: 6.4 % (ref 0.0–7.0)
MCH: 33.3 pg (ref 25.1–34.0)
MCV: 96.2 fL (ref 79.5–101.0)
MONO%: 5.6 % (ref 0.0–14.0)
NEUT#: 1.6 10*3/uL (ref 1.5–6.5)
RBC: 3.12 10*6/uL — ABNORMAL LOW (ref 3.70–5.45)
RDW: 13.9 % (ref 11.2–14.5)
lymph#: 0.5 10*3/uL — ABNORMAL LOW (ref 0.9–3.3)
nRBC: 0 % (ref 0–0)

## 2011-11-11 LAB — COMPREHENSIVE METABOLIC PANEL
ALT: 17 U/L (ref 0–35)
AST: 32 U/L (ref 0–37)
Albumin: 3.1 g/dL — ABNORMAL LOW (ref 3.5–5.2)
Alkaline Phosphatase: 118 U/L — ABNORMAL HIGH (ref 39–117)
BUN: 20 mg/dL (ref 6–23)
CO2: 26 mEq/L (ref 19–32)
Calcium: 9.8 mg/dL (ref 8.4–10.5)
Chloride: 106 mEq/L (ref 96–112)
Creatinine, Ser: 1.07 mg/dL (ref 0.50–1.10)
Glucose, Bld: 106 mg/dL — ABNORMAL HIGH (ref 70–99)
Potassium: 4.3 mEq/L (ref 3.5–5.3)
Sodium: 138 mEq/L (ref 135–145)
Total Bilirubin: 0.7 mg/dL (ref 0.3–1.2)
Total Protein: 6.3 g/dL (ref 6.0–8.3)

## 2011-11-17 ENCOUNTER — Encounter
Payer: PRIVATE HEALTH INSURANCE | Attending: Physical Medicine & Rehabilitation | Admitting: Physical Medicine & Rehabilitation

## 2011-11-18 ENCOUNTER — Encounter (HOSPITAL_COMMUNITY)
Admission: RE | Admit: 2011-11-18 | Discharge: 2011-11-18 | Disposition: A | Discharge status: Home / Self Care | Payer: PRIVATE HEALTH INSURANCE | Source: Ambulatory Visit | Attending: Oncology | Admitting: Oncology

## 2011-11-18 ENCOUNTER — Other Ambulatory Visit: Payer: Self-pay | Admitting: Physician Assistant

## 2011-11-18 ENCOUNTER — Other Ambulatory Visit: Payer: Self-pay | Admitting: *Deleted

## 2011-11-18 ENCOUNTER — Other Ambulatory Visit (HOSPITAL_BASED_OUTPATIENT_CLINIC_OR_DEPARTMENT_OTHER): Payer: PRIVATE HEALTH INSURANCE | Admitting: Lab

## 2011-11-18 DIAGNOSIS — D696 Thrombocytopenia, unspecified: Secondary | ICD-10-CM | POA: Insufficient documentation

## 2011-11-18 DIAGNOSIS — C719 Malignant neoplasm of brain, unspecified: Secondary | ICD-10-CM

## 2011-11-18 LAB — CBC WITH DIFFERENTIAL/PLATELET
BASO%: 0.1 % (ref 0.0–2.0)
HCT: 31 % — ABNORMAL LOW (ref 34.8–46.6)
LYMPH%: 31.8 % (ref 14.0–49.7)
MCH: 33.5 pg (ref 25.1–34.0)
MCHC: 34.1 g/dL (ref 31.5–36.0)
MCV: 98.1 fL (ref 79.5–101.0)
MONO#: 0.1 10*3/uL (ref 0.1–0.9)
MONO%: 6.6 % (ref 0.0–14.0)
NEUT%: 56.2 % (ref 38.4–76.8)
Platelets: 15 10*3/uL — ABNORMAL LOW (ref 145–400)

## 2011-11-19 ENCOUNTER — Other Ambulatory Visit: Payer: Self-pay | Admitting: *Deleted

## 2011-11-19 ENCOUNTER — Ambulatory Visit (HOSPITAL_BASED_OUTPATIENT_CLINIC_OR_DEPARTMENT_OTHER): Payer: PRIVATE HEALTH INSURANCE

## 2011-11-19 VITALS — BP 115/71 | HR 96 | Temp 98.1°F | Resp 18

## 2011-11-19 DIAGNOSIS — C719 Malignant neoplasm of brain, unspecified: Secondary | ICD-10-CM

## 2011-11-19 DIAGNOSIS — D696 Thrombocytopenia, unspecified: Secondary | ICD-10-CM

## 2011-11-19 LAB — ABO/RH: ABO/RH(D): B POS

## 2011-11-19 MED ORDER — DIPHENHYDRAMINE HCL 25 MG PO CAPS
25.0000 mg | ORAL_CAPSULE | Freq: Once | ORAL | Status: AC
  Administered 2011-11-19: 25 mg via ORAL

## 2011-11-19 MED ORDER — SODIUM CHLORIDE 0.9 % IV SOLN: 250.0000 mL | Freq: Once | INTRAVENOUS | Status: DC

## 2011-11-19 MED ORDER — SODIUM CHLORIDE 0.9 % IJ SOLN
3.0000 mL | INTRAMUSCULAR | Status: DC | PRN
  Filled 2011-11-19: qty 10

## 2011-11-19 MED ORDER — HEPARIN SOD (PORK) LOCK FLUSH 100 UNIT/ML IV SOLN
500.0000 [IU] | Freq: Every day | INTRAVENOUS | Status: DC | PRN
Start: 2011-11-19 — End: 2011-11-19
  Filled 2011-11-19: qty 5

## 2011-11-19 MED ORDER — SODIUM CHLORIDE 0.9 % IJ SOLN
10.0000 mL | INTRAMUSCULAR | Status: DC | PRN
  Filled 2011-11-19: qty 10

## 2011-11-19 MED ORDER — HEPARIN SOD (PORK) LOCK FLUSH 100 UNIT/ML IV SOLN
250.0000 [IU] | INTRAVENOUS | Status: DC | PRN
  Filled 2011-11-19: qty 5

## 2011-11-19 MED ORDER — ACETAMINOPHEN 325 MG PO TABS
650.0000 mg | ORAL_TABLET | Freq: Once | ORAL | Status: AC
  Administered 2011-11-19: 650 mg via ORAL

## 2011-11-20 LAB — PREPARE PLATELET PHERESIS: Unit division: 0

## 2011-12-02 ENCOUNTER — Telehealth: Payer: Self-pay | Admitting: *Deleted

## 2011-12-02 ENCOUNTER — Other Ambulatory Visit (HOSPITAL_BASED_OUTPATIENT_CLINIC_OR_DEPARTMENT_OTHER): Payer: PRIVATE HEALTH INSURANCE | Admitting: Lab

## 2011-12-02 ENCOUNTER — Ambulatory Visit (HOSPITAL_BASED_OUTPATIENT_CLINIC_OR_DEPARTMENT_OTHER): Payer: PRIVATE HEALTH INSURANCE | Admitting: Oncology

## 2011-12-02 VITALS — BP 127/75 | HR 80 | Temp 97.8°F | Ht 64.0 in | Wt 151.5 lb

## 2011-12-02 DIAGNOSIS — C719 Malignant neoplasm of brain, unspecified: Secondary | ICD-10-CM

## 2011-12-02 DIAGNOSIS — C713 Malignant neoplasm of parietal lobe: Secondary | ICD-10-CM

## 2011-12-02 LAB — COMPREHENSIVE METABOLIC PANEL
AST: 34 U/L (ref 0–37)
Albumin: 3.3 g/dL — ABNORMAL LOW (ref 3.5–5.2)
Alkaline Phosphatase: 136 U/L — ABNORMAL HIGH (ref 39–117)
Calcium: 10.4 mg/dL (ref 8.4–10.5)
Chloride: 102 mEq/L (ref 96–112)
Glucose, Bld: 99 mg/dL (ref 70–99)
Potassium: 3.9 mEq/L (ref 3.5–5.3)
Sodium: 136 mEq/L (ref 135–145)
Total Protein: 6.7 g/dL (ref 6.0–8.3)

## 2011-12-02 LAB — CBC WITH DIFFERENTIAL/PLATELET
EOS%: 0.5 % (ref 0.0–7.0)
Eosinophils Absolute: 0 10*3/uL (ref 0.0–0.5)
HGB: 9.2 g/dL — ABNORMAL LOW (ref 11.6–15.9)
MCV: 100.3 fL (ref 79.5–101.0)
MONO%: 14.1 % — ABNORMAL HIGH (ref 0.0–14.0)
NEUT#: 0.3 10*3/uL — CL (ref 1.5–6.5)
RBC: 2.66 10*6/uL — ABNORMAL LOW (ref 3.70–5.45)
RDW: 14.3 % (ref 11.2–14.5)
WBC: 0.9 10*3/uL — CL (ref 3.9–10.3)
lymph#: 0.5 10*3/uL — ABNORMAL LOW (ref 0.9–3.3)

## 2011-12-02 MED ORDER — PHENYTOIN SODIUM EXTENDED 200 MG PO CAPS: 200.0000 mg | ORAL_CAPSULE | Freq: Two times a day (BID) | ORAL | Status: DC

## 2011-12-02 MED ORDER — DIAZEPAM 5 MG PO TABS: ORAL_TABLET | ORAL | Status: AC
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
## 2011-12-02 NOTE — Telephone Encounter (Signed)
Gave patient appointment for 12-16-2011 at 2:30pm lab only 02-17-2012 starting at 3:30pm with labs mri of the brain on 02-12-2012 arrival 6:45pm printed out calendar and gave to the patient

## 2011-12-03 ENCOUNTER — Other Ambulatory Visit: Payer: Self-pay | Admitting: *Deleted

## 2011-12-03 LAB — PHENYTOIN LEVEL, TOTAL: Phenytoin Lvl: 6.4 ug/mL — ABNORMAL LOW (ref 10.0–20.0)

## 2011-12-08 ENCOUNTER — Other Ambulatory Visit: Payer: Self-pay | Admitting: Oncology

## 2011-12-16 ENCOUNTER — Other Ambulatory Visit (HOSPITAL_BASED_OUTPATIENT_CLINIC_OR_DEPARTMENT_OTHER): Payer: PRIVATE HEALTH INSURANCE | Admitting: Lab

## 2011-12-16 DIAGNOSIS — C719 Malignant neoplasm of brain, unspecified: Secondary | ICD-10-CM

## 2011-12-16 DIAGNOSIS — C713 Malignant neoplasm of parietal lobe: Secondary | ICD-10-CM

## 2011-12-16 LAB — CBC WITH DIFFERENTIAL/PLATELET
EOS%: 0.4 % (ref 0.0–7.0)
Eosinophils Absolute: 0 10*3/uL (ref 0.0–0.5)
LYMPH%: 23.4 % (ref 14.0–49.7)
MCH: 34.3 pg — ABNORMAL HIGH (ref 25.1–34.0)
MCHC: 33.9 g/dL (ref 31.5–36.0)
MCV: 101.3 fL — ABNORMAL HIGH (ref 79.5–101.0)
MONO%: 10.7 % (ref 0.0–14.0)
NEUT#: 1.5 10*3/uL (ref 1.5–6.5)
Platelets: 146 10*3/uL (ref 145–400)
RBC: 3.02 10*6/uL — ABNORMAL LOW (ref 3.70–5.45)

## 2011-12-17 LAB — PHENYTOIN LEVEL, TOTAL: Phenytoin Lvl: 3 ug/mL — ABNORMAL LOW (ref 10.0–20.0)

## 2011-12-17 LAB — COMPREHENSIVE METABOLIC PANEL
AST: 42 U/L — ABNORMAL HIGH (ref 0–37)
Alkaline Phosphatase: 109 U/L (ref 39–117)
Glucose, Bld: 92 mg/dL (ref 70–99)
Sodium: 141 mEq/L (ref 135–145)
Total Bilirubin: 0.6 mg/dL (ref 0.3–1.2)
Total Protein: 6 g/dL (ref 6.0–8.3)

## 2011-12-18 ENCOUNTER — Other Ambulatory Visit: Payer: Self-pay | Admitting: *Deleted

## 2011-12-18 ENCOUNTER — Telehealth: Payer: Self-pay | Admitting: Oncology

## 2011-12-18 DIAGNOSIS — C50919 Malignant neoplasm of unspecified site of unspecified female breast: Secondary | ICD-10-CM

## 2011-12-18 DIAGNOSIS — C719 Malignant neoplasm of brain, unspecified: Secondary | ICD-10-CM

## 2011-12-18 NOTE — Telephone Encounter (Signed)
lmonvm adviisng the pt of her aug lab appt

## 2011-12-23 IMAGING — CR DG CHEST 1V PORT
1 series · 1 of 1 positions shown · non-contrast
Comparison: 08/18/2010

CLINICAL DATA: Line placement.

PORTABLE CHEST - 1 VIEW

[view not recorded]
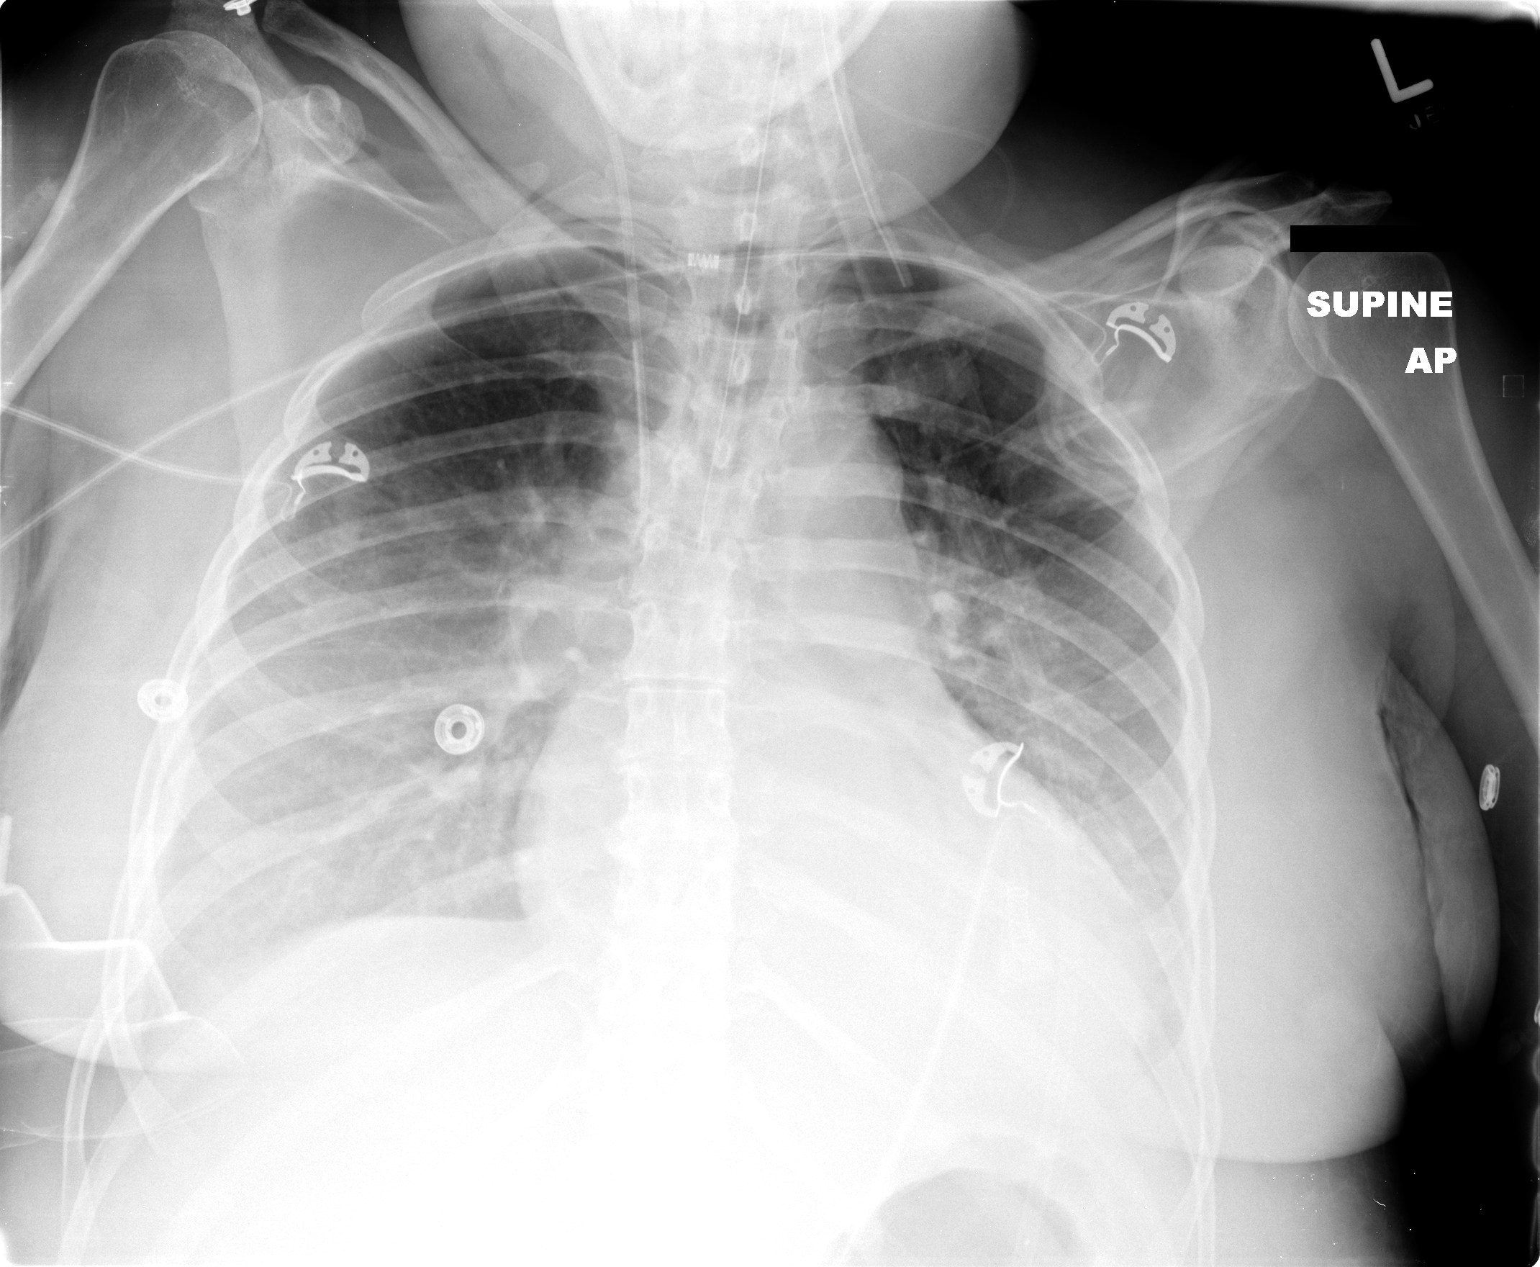

[1 of 1 positions shown; findings below may reference images not displayed]

FINDINGS: 3383 hours.  Endotracheal tube tip is at the carina.
This could be pulled back 2 cm for more appropriate positioning.
Right IJ central line tip projects at the proximal SVC level, near
the azygos confluence.  There is bibasilar atelectasis, left
greater than right.  Question layering bilateral pleural effusions.
Telemetry leads overlie the chest.
IMPRESSION: Endotracheal tube tip is at the carina.

Right IJ central line tip projects at the proximal SVC level.  No
evidence for pneumothorax.

Bibasilar atelectasis with possible layering bilateral pleural
effusions.

I personally called the results of the study to the PACU nurse,
Greatfranci, at 5111 hours on 08/28/2010.

## 2011-12-25 IMAGING — CR DG CHEST 1V PORT
1 series · 1 of 1 positions shown · non-contrast
Comparison: Portable chest x-ray of 08/28/2010

CLINICAL DATA: Productive cough for 3 days, shortness of breath

PORTABLE CHEST - 1 VIEW

[AP]
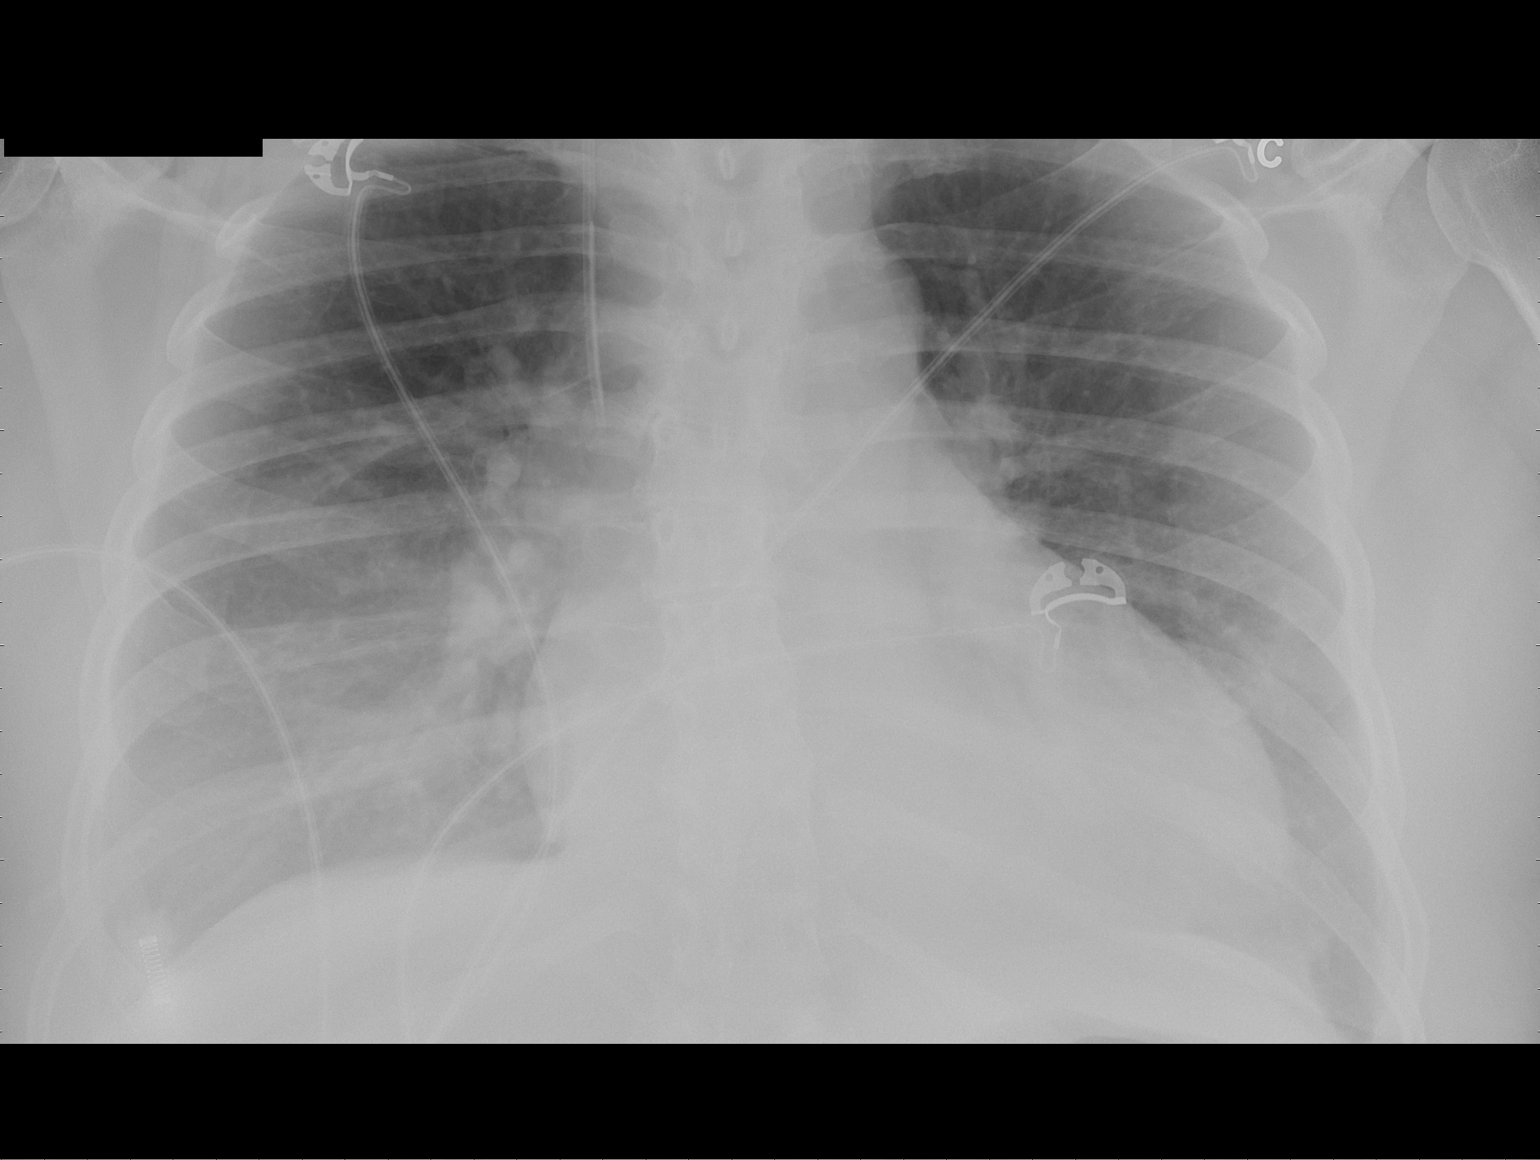

[1 of 1 positions shown; findings below may reference images not displayed]

FINDINGS: Haziness remains at both lung bases consistent with
effusions and atelectasis.  Pneumonia is difficult to exclude.
Cardiomegaly is stable.  Right IJ central venous catheter tip
remains in the upper SVC.  The endotracheal tube has been removed.
IMPRESSION: 1.  Endotracheal tube removed.  No significant change in aeration.
2.  Little change in opacities at the bases most consistent with
effusions, atelectasis, and possibly edema or pneumonia.

## 2012-01-19 ENCOUNTER — Other Ambulatory Visit: Payer: PRIVATE HEALTH INSURANCE | Admitting: Lab

## 2012-02-12 ENCOUNTER — Ambulatory Visit (HOSPITAL_COMMUNITY)
Admission: RE | Admit: 2012-02-12 | Discharge: 2012-02-12 | Disposition: A | Discharge status: Home / Self Care | Payer: PRIVATE HEALTH INSURANCE | Source: Ambulatory Visit | Attending: Oncology | Admitting: Oncology

## 2012-02-12 DIAGNOSIS — G930 Cerebral cysts: Secondary | ICD-10-CM | POA: Insufficient documentation

## 2012-02-12 DIAGNOSIS — R51 Headache: Secondary | ICD-10-CM | POA: Insufficient documentation

## 2012-02-12 DIAGNOSIS — C719 Malignant neoplasm of brain, unspecified: Secondary | ICD-10-CM

## 2012-02-12 MED ORDER — GADOBENATE DIMEGLUMINE 529 MG/ML IV SOLN
20.0000 mL | Freq: Once | INTRAVENOUS | Status: AC | PRN
  Administered 2012-02-12: 14 mL via INTRAVENOUS

## 2012-02-17 ENCOUNTER — Ambulatory Visit (HOSPITAL_BASED_OUTPATIENT_CLINIC_OR_DEPARTMENT_OTHER): Payer: PRIVATE HEALTH INSURANCE | Admitting: Oncology

## 2012-02-17 ENCOUNTER — Telehealth: Payer: Self-pay | Admitting: *Deleted

## 2012-02-17 ENCOUNTER — Other Ambulatory Visit (HOSPITAL_BASED_OUTPATIENT_CLINIC_OR_DEPARTMENT_OTHER): Payer: PRIVATE HEALTH INSURANCE | Admitting: Lab

## 2012-02-17 VITALS — BP 122/84 | HR 86 | Temp 98.8°F | Resp 20 | Ht 64.0 in | Wt 150.8 lb

## 2012-02-17 DIAGNOSIS — C719 Malignant neoplasm of brain, unspecified: Secondary | ICD-10-CM

## 2012-02-17 DIAGNOSIS — R7989 Other specified abnormal findings of blood chemistry: Secondary | ICD-10-CM

## 2012-02-17 DIAGNOSIS — C713 Malignant neoplasm of parietal lobe: Secondary | ICD-10-CM

## 2012-02-17 DIAGNOSIS — C50919 Malignant neoplasm of unspecified site of unspecified female breast: Secondary | ICD-10-CM

## 2012-02-17 LAB — CBC WITH DIFFERENTIAL/PLATELET
Basophils Absolute: 0 10*3/uL (ref 0.0–0.1)
EOS%: 1.9 % (ref 0.0–7.0)
HCT: 35.6 % (ref 34.8–46.6)
HGB: 12.2 g/dL (ref 11.6–15.9)
LYMPH%: 30.7 % (ref 14.0–49.7)
MCH: 33.2 pg (ref 25.1–34.0)
MCV: 96.7 fL (ref 79.5–101.0)
MONO%: 9.6 % (ref 0.0–14.0)
NEUT%: 56.7 % (ref 38.4–76.8)
Platelets: 80 10*3/uL — ABNORMAL LOW (ref 145–400)

## 2012-02-17 LAB — COMPREHENSIVE METABOLIC PANEL (CC13)
Albumin: 2.8 g/dL — ABNORMAL LOW (ref 3.5–5.0)
Alkaline Phosphatase: 234 U/L — ABNORMAL HIGH (ref 40–150)
BUN: 14 mg/dL (ref 7.0–26.0)
Glucose: 99 mg/dl (ref 70–99)
Potassium: 3.5 mEq/L (ref 3.5–5.1)
Total Bilirubin: 1.8 mg/dL — ABNORMAL HIGH (ref 0.20–1.20)

## 2012-02-17 MED ORDER — LEVETIRACETAM 500 MG PO TABS: 500.0000 mg | ORAL_TABLET | Freq: Two times a day (BID) | ORAL | Status: DC

## 2012-02-17 MED ORDER — CLONAZEPAM 0.5 MG PO TABS: 0.5000 mg | ORAL_TABLET | Freq: Two times a day (BID) | ORAL | Status: DC | PRN

## 2012-02-17 MED ORDER — TRAMADOL HCL 50 MG PO TABS: 50.0000 mg | ORAL_TABLET | Freq: Four times a day (QID) | ORAL | Status: DC | PRN

## 2012-02-17 MED ORDER — OXYCODONE HCL 5 MG PO TABS: ORAL_TABLET | ORAL | Status: DC

## 2012-02-17 NOTE — Telephone Encounter (Signed)
05-04-2012 lab and md starting at 2:45pm  04-27-2012 mri brain arrival at 6:45pm
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
## 2012-02-18 ENCOUNTER — Other Ambulatory Visit: Payer: Self-pay | Admitting: Oncology

## 2012-02-18 DIAGNOSIS — C719 Malignant neoplasm of brain, unspecified: Secondary | ICD-10-CM

## 2012-02-19 ENCOUNTER — Telehealth: Payer: Self-pay | Admitting: *Deleted

## 2012-02-19 NOTE — Telephone Encounter (Signed)
add labs to pt's visit w dr Kathrynn Running oct 10

## 2012-03-05 NOTE — Progress Notes (Addendum)
06/10/11 UPDATE TO CASE MANAGER Loistine Chance, RN FAX [phone] HER PHONE NUMBER IS [phone] PAGES SENT.  10/09/11 UPDATE TO Loistine Chance, RN CASE MANAGER. 9 PAGES SENT.  01/13/12 UPDATE TO Loistine Chance, RN CASE MANAGER 17 PAGES SENT.  03/05/12 UPDATE TO Loistine Chance, RN CASE MANAGER 7 PAGES SENT.

## 2012-03-09 DIAGNOSIS — Z923 Personal history of irradiation: Secondary | ICD-10-CM | POA: Insufficient documentation

## 2012-03-11 ENCOUNTER — Other Ambulatory Visit: Payer: PRIVATE HEALTH INSURANCE | Admitting: Lab

## 2012-03-11 ENCOUNTER — Ambulatory Visit: Payer: PRIVATE HEALTH INSURANCE | Admitting: Radiation Oncology

## 2012-04-08 ENCOUNTER — Telehealth: Payer: Self-pay | Admitting: *Deleted

## 2012-04-08 NOTE — Telephone Encounter (Signed)
This RN received a call from pt's husband, Jorja Loa, who is requesting an appointment with AB/PA ASAP due to " she is just not acting right ".  Informed Casper Harrison does not have any openings for over a week and inquired what are concerns that he feels are urgent. Per conversation Jorja Loa stated " she's saying things.. And using more of her meds..like her clonipin ". "A good friend of her took her oxycontin out of the house so she couldn't use it too much ".  Onalee Hua states he works 10 hours a day and " I told her I could not stay with her and I can't pay someone to stay with her " This RN had to ask very direct questions with husband to obtain clarity of his concern relating to being medical or mental. Per Tim behaviors seem more " mental and related to her depression but more then I have seen before ". Tim states Mame says " It would be better if I wasn't alive ". Tim did not ask further questions nor pt if she has suicidal plans. " I thought Amy could talk with her "  This RN also inquired about Jola's use of alcohol at this time which he stated " I don't have it in the home because I knew she was coming back here to stay but she was with a good friend of ours and my understanding is she drank up all their alcohol "  Per Jackelyn Knife has been back at his home since earlier this week without alcohol available. This RN discussed with Tim pt's increase use of prescribed medication inappropriately may be Cierra's way of handling not having alcohol and need for him to pursue appropriate counseling for pt if he is concerned. This RN also stated we could have a Child psychotherapist or chaplain call her for further evaluation.  Tim feels pt would be " upset that I talked to you and and would rather see if Amy could see her "  This RN reviewed above with AB/PA who recommends pt to go to the ER for more direct and appropriate care.  Call returned to Tim with this nurse reiterating recommendation of AB/PA - Tim  responded with " I don't think that would flush well with her ". This RN again reiterated primary concern is for pt's safety - proceeding the ER for evaluation would allow for pt to receive appropriate counseling and medications relating to her depression.  " well I will try to talk with her about this but I still would like her to see Amy sometime next week "  This RN ended conversation with verbal support given to Tim per above including proceeding to the ER if he is concern can be a way of providing and showing care for pt's well being.

## 2012-04-27 ENCOUNTER — Ambulatory Visit (HOSPITAL_COMMUNITY)
Admission: RE | Admit: 2012-04-27 | Discharge: 2012-04-27 | Disposition: A | Discharge status: Home / Self Care | Payer: PRIVATE HEALTH INSURANCE | Source: Ambulatory Visit | Attending: Oncology | Admitting: Oncology

## 2012-04-27 DIAGNOSIS — C719 Malignant neoplasm of brain, unspecified: Secondary | ICD-10-CM | POA: Insufficient documentation

## 2012-04-27 MED ORDER — GADOBENATE DIMEGLUMINE 529 MG/ML IV SOLN
7.0000 mL | Freq: Once | INTRAVENOUS | Status: AC | PRN
  Administered 2012-04-27: 7 mL via INTRAVENOUS

## 2012-04-28 LAB — POCT I-STAT, CHEM 8
BUN: 14 mg/dL (ref 6–23)
Chloride: 100 mEq/L (ref 96–112)
Creatinine, Ser: 1.3 mg/dL — ABNORMAL HIGH (ref 0.50–1.10)
Glucose, Bld: 86 mg/dL (ref 70–99)
Potassium: 3.5 mEq/L (ref 3.5–5.1)
Sodium: 136 mEq/L (ref 135–145)

## 2012-05-03 ENCOUNTER — Telehealth: Payer: Self-pay | Admitting: Radiation Oncology

## 2012-05-03 NOTE — Telephone Encounter (Signed)
Boneta Lucks, clinical nurse w [facility] called for test and tx plan(s) updates. Faxing updated MRI today.  Fx: [phone] Ph: [phone]

## 2012-05-04 ENCOUNTER — Other Ambulatory Visit (HOSPITAL_BASED_OUTPATIENT_CLINIC_OR_DEPARTMENT_OTHER): Payer: PRIVATE HEALTH INSURANCE | Admitting: Lab

## 2012-05-04 ENCOUNTER — Telehealth: Payer: Self-pay | Admitting: *Deleted

## 2012-05-04 ENCOUNTER — Ambulatory Visit (HOSPITAL_BASED_OUTPATIENT_CLINIC_OR_DEPARTMENT_OTHER): Payer: PRIVATE HEALTH INSURANCE | Admitting: Physician Assistant

## 2012-05-04 VITALS — BP 135/84 | HR 89 | Temp 97.7°F | Resp 20 | Ht 64.0 in | Wt 153.7 lb

## 2012-05-04 DIAGNOSIS — C719 Malignant neoplasm of brain, unspecified: Secondary | ICD-10-CM

## 2012-05-04 DIAGNOSIS — D696 Thrombocytopenia, unspecified: Secondary | ICD-10-CM

## 2012-05-04 DIAGNOSIS — D709 Neutropenia, unspecified: Secondary | ICD-10-CM

## 2012-05-04 DIAGNOSIS — C713 Malignant neoplasm of parietal lobe: Secondary | ICD-10-CM

## 2012-05-04 DIAGNOSIS — C50919 Malignant neoplasm of unspecified site of unspecified female breast: Secondary | ICD-10-CM

## 2012-05-04 LAB — CBC WITH DIFFERENTIAL/PLATELET
Eosinophils Absolute: 0.1 10*3/uL (ref 0.0–0.5)
MONO#: 0.2 10*3/uL (ref 0.1–0.9)
NEUT#: 1.2 10*3/uL — ABNORMAL LOW (ref 1.5–6.5)
RBC: 3.1 10*6/uL — ABNORMAL LOW (ref 3.70–5.45)
RDW: 14.8 % — ABNORMAL HIGH (ref 11.2–14.5)
WBC: 2.4 10*3/uL — ABNORMAL LOW (ref 3.9–10.3)
lymph#: 0.9 10*3/uL (ref 0.9–3.3)

## 2012-05-04 LAB — COMPREHENSIVE METABOLIC PANEL (CC13)
Albumin: 2.4 g/dL — ABNORMAL LOW (ref 3.5–5.0)
Alkaline Phosphatase: 169 U/L — ABNORMAL HIGH (ref 40–150)
CO2: 29 mEq/L (ref 22–29)
Chloride: 103 mEq/L (ref 98–107)
Glucose: 81 mg/dl (ref 70–99)
Potassium: 3.6 mEq/L (ref 3.5–5.1)
Sodium: 141 mEq/L (ref 136–145)
Total Protein: 5.7 g/dL — ABNORMAL LOW (ref 6.4–8.3)

## 2012-05-04 MED ORDER — ZOLPIDEM TARTRATE 10 MG PO TABS: 10.0000 mg | ORAL_TABLET | Freq: Every evening | ORAL | Status: DC | PRN

## 2012-05-04 MED ORDER — OXYCODONE HCL 5 MG PO TABS: 5.0000 mg | ORAL_TABLET | Freq: Three times a day (TID) | ORAL | Status: DC | PRN

## 2012-05-04 NOTE — Telephone Encounter (Signed)
Gave patient appointment for 08-31-2012 starting at 3:30pm   Gave patient instructions on central scheduling will call her and set up the pet scan

## 2012-05-04 NOTE — Telephone Encounter (Addendum)
Per Clydie Braun she will scheduled dr.manning appointment day after the pet scan and she stated she would call and get the results stat because I did no think the results would be back in time to see manning one day after the scan but she stated she would call the results stat and the md would have the results when he see the patient on 06-30-2012
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
## 2012-06-29 ENCOUNTER — Encounter (HOSPITAL_COMMUNITY)
Admission: RE | Admit: 2012-06-29 | Discharge: 2012-06-29 | Disposition: A | Discharge status: Home / Self Care | Payer: PRIVATE HEALTH INSURANCE | Source: Ambulatory Visit | Attending: Oncology | Admitting: Oncology

## 2012-06-29 ENCOUNTER — Other Ambulatory Visit (HOSPITAL_BASED_OUTPATIENT_CLINIC_OR_DEPARTMENT_OTHER): Payer: PRIVATE HEALTH INSURANCE | Admitting: Lab

## 2012-06-29 DIAGNOSIS — C719 Malignant neoplasm of brain, unspecified: Secondary | ICD-10-CM

## 2012-06-29 LAB — COMPREHENSIVE METABOLIC PANEL (CC13)
AST: 45 U/L — ABNORMAL HIGH (ref 5–34)
Albumin: 2.7 g/dL — ABNORMAL LOW (ref 3.5–5.0)
Alkaline Phosphatase: 142 U/L (ref 40–150)
Glucose: 94 mg/dl (ref 70–99)
Potassium: 3.3 mEq/L — ABNORMAL LOW (ref 3.5–5.1)
Sodium: 145 mEq/L (ref 136–145)
Total Protein: 6.5 g/dL (ref 6.4–8.3)

## 2012-06-29 LAB — CBC WITH DIFFERENTIAL/PLATELET
Eosinophils Absolute: 0.1 10*3/uL (ref 0.0–0.5)
MCV: 97.9 fL (ref 79.5–101.0)
MONO%: 9.1 % (ref 0.0–14.0)
NEUT#: 1.3 10*3/uL — ABNORMAL LOW (ref 1.5–6.5)
RBC: 3.47 10*6/uL — ABNORMAL LOW (ref 3.70–5.45)
RDW: 13.1 % (ref 11.2–14.5)
WBC: 2.7 10*3/uL — ABNORMAL LOW (ref 3.9–10.3)

## 2012-06-29 MED ORDER — FLUDEOXYGLUCOSE F - 18 (FDG) INJECTION
10.6000 | Freq: Once | INTRAVENOUS | Status: AC | PRN
  Administered 2012-06-29: 10.6 via INTRAVENOUS

## 2012-06-30 ENCOUNTER — Ambulatory Visit: Payer: PRIVATE HEALTH INSURANCE | Admitting: Radiation Oncology

## 2012-06-30 ENCOUNTER — Ambulatory Visit: Payer: PRIVATE HEALTH INSURANCE

## 2012-07-05 ENCOUNTER — Ambulatory Visit
Admission: RE | Admit: 2012-07-05 | Discharge: 2012-07-05 | Disposition: A | Discharge status: Home / Self Care | Payer: PRIVATE HEALTH INSURANCE | Source: Ambulatory Visit | Attending: Radiation Oncology | Admitting: Radiation Oncology

## 2012-07-05 ENCOUNTER — Encounter: Payer: Self-pay | Admitting: Radiation Oncology

## 2012-07-05 DIAGNOSIS — C719 Malignant neoplasm of brain, unspecified: Secondary | ICD-10-CM

## 2012-07-05 NOTE — Progress Notes (Signed)
Appointment Re-Scheduled

## 2012-07-05 NOTE — Progress Notes (Signed)
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  In follow-up on 04/27/12, she was noted to have continued slight progression in size of the two cysts in the right parietal lobe, concerning for tumor growth and a small enhancing nodule anterior to the cyst which was stable.  Consequently, she underwent brain PET on 06/29/12 to assess for recurrence and returns today to review the result.   ALLERGIES:  is allergic to vasotec.  Meds: Current Outpatient Prescriptions  Medication Sig Dispense Refill  . cholecalciferol (VITAMIN D) 400 UNITS TABS Take 1,000 Units by mouth as needed.       . clonazePAM (KLONOPIN) 0.5 MG tablet Take 1 tablet (0.5 mg total) by mouth 2 (two) times daily as needed. For anxiety  30 tablet  3  . diazepam (VALIUM) 5 MG tablet Take 1-2 tabs PO, 30 minutes prior to MRI  2 tablet  0  . ferrous sulfate 325 (65 FE) MG tablet Take 325 mg by mouth daily with breakfast.       . FLUoxetine (PROZAC) 20 MG tablet Take 30 mg by mouth 2 (two) times daily.        . folic acid (FOLVITE) 1 MG tablet Take 1 mg by mouth daily.        Marland Kitchen levETIRAcetam (KEPPRA) 500 MG tablet Take 1 tablet (500 mg total) by mouth every 12 (twelve) hours.  60 tablet  3  . Multiple Vitamin (MULTIVITAMIN) tablet Take 1 tablet by mouth daily.        Marland Kitchen oxyCODONE (OXY IR/ROXICODONE) 5 MG immediate release tablet Take 1 tablet (5 mg total) by mouth every 8 (eight) hours as needed for pain. 1-2  tabs po q 4 hr prn pain  30 tablet  0  . potassium chloride (KLOR-CON) 20 MEQ packet Take 20 mEq by mouth daily.  30 tablet  3  . prochlorperazine (COMPAZINE) 10 MG tablet Take 10 mg by mouth every 6 (six) hours as needed. For nausea      . traMADol (ULTRAM) 50 MG tablet Take 1 tablet (50 mg total) by mouth every 6 (six) hours as needed. Maximum dose= 8 tablets per day  30 tablet  3  . zolpidem (AMBIEN) 10 MG tablet Take 1 tablet (10 mg total) by mouth at bedtime as needed for sleep.  30 tablet  4    Physical Findings: The patient is in no acute distress. Patient is alert and oriented.  vitals were not taken for this visit..  No significant changes.  Lab Findings: Lab Results  Component Value Date   WBC 2.7* 06/29/2012   HGB 11.8 06/29/2012   HCT 33.9* 06/29/2012   MCV 97.9 06/29/2012   PLT 106* 06/29/2012    Radiographic Findings: Nm Pet Metabolic Brain  06/30/2012  *RADIOLOGY REPORT*  Clinical Data:  Astrocytoma status post tomotherapy  June 2012. The is patient receiving adjuvant chemotherapy.Two cysts at the treatment site in the right parietal lobe have enlarged  on comparison MRI.  NUCLEAR MEDICINE BRAIN PET CT  Technique: 10.6 mCi F-18 FDG was injected intravenously via the right hand.  Full-ring PET imaging was performed from the vertex to the skull base.  CT data was obtained and used for attenuation correction and anatomic localization only.  (This was not acquired as a diagnostic CT examination.)  FDG  PET data set was co registered with the MRI brain from 06/28/2011  Comparison:  Contrast  brain MRI 04/27/2012  Findings:  There is a round photopenia defect within the right parietal lobe corresponding to the cysts on the comparison MRI. There is larger photopenic defect inferiorly and a smaller defect superiorly (image 14 and 11 respectively).  No additional abnormal foci of metabolic activity within the brain.  There is no focal hypermetabolic activity above the background cortical  activity at the site of the small enhancing lesion anterior to the resection site.   IMPRESSION: No metabolic activity at the treatment site in the right parietal lobe to suggest tumor recurrence.   Original Report Authenticated By: Genevive Bi, M.D.    Impression:  The patient is recovering from the effects of radiation.  She has cystic changes in the brain which are not hypermetabolic at the tumor site.  So, there is no evidence of recurrence at this time.  Plan:  I will see her back for follow-up in 6-7 months.  It may be prudent to pursue brain MRI prior to her next follow-up with Dr. Darnelle Catalan on 08/31/12  _____________________________________  Artist Pais. Kathrynn Running, M.D.

## 2012-07-05 NOTE — Progress Notes (Signed)
Recon Anaplastic astrocytoma of right parietal lobe Brain PET scan 06/29/12 results Radiation therapy  4/23/212- 11/07/2010 ;59.4Gy /33 fractions of 1.8Gy Adjuvant Temodar completed May 2013    Allergies: Vasotec causes angioedema

## 2012-07-07 ENCOUNTER — Ambulatory Visit
Admission: RE | Admit: 2012-07-07 | Discharge: 2012-07-07 | Disposition: A | Discharge status: Home / Self Care | Payer: PRIVATE HEALTH INSURANCE | Source: Ambulatory Visit | Attending: Radiation Oncology | Admitting: Radiation Oncology

## 2012-07-07 ENCOUNTER — Other Ambulatory Visit: Payer: Self-pay | Admitting: Radiation Therapy

## 2012-07-07 ENCOUNTER — Other Ambulatory Visit: Payer: Self-pay | Admitting: Oncology

## 2012-07-07 ENCOUNTER — Encounter: Payer: Self-pay | Admitting: Radiation Oncology

## 2012-07-07 VITALS — BP 135/92 | HR 111 | Temp 98.1°F | Resp 20 | Ht 64.0 in | Wt 153.3 lb

## 2012-07-07 DIAGNOSIS — Z9221 Personal history of antineoplastic chemotherapy: Secondary | ICD-10-CM | POA: Insufficient documentation

## 2012-07-07 DIAGNOSIS — C719 Malignant neoplasm of brain, unspecified: Secondary | ICD-10-CM

## 2012-07-07 DIAGNOSIS — Z923 Personal history of irradiation: Secondary | ICD-10-CM | POA: Insufficient documentation

## 2012-07-07 DIAGNOSIS — C713 Malignant neoplasm of parietal lobe: Secondary | ICD-10-CM | POA: Insufficient documentation

## 2012-07-07 DIAGNOSIS — Z79899 Other long term (current) drug therapy: Secondary | ICD-10-CM | POA: Insufficient documentation

## 2012-07-07 NOTE — Progress Notes (Signed)
Please see the Nurse Progress Note in the MD Initial Consult Encounter for this patient. 

## 2012-07-07 NOTE — Progress Notes (Signed)
Recon = anaplastic astrocytoma  Of right parietal brain: S/p rad tx right parietal brain 09/23/10-11/07/10 MRI brian 06/29/12 result in Alert,oriented x3, c/o having head aches right front and back of right side of head daily, takes trama dol which helps, c/o abdomen tightness this am , has blurred vision stated,needs to see eye Doctor, states haven't been back in 4 years though, occasional nausea, none today, took medication yesterday for nausea,,  fatigued easily stated, her  balance is off especially left foot, this has been going on for quite a while, 97% room air sats, not on Temodar or Decadron,  10:45 AM

## 2012-07-09 NOTE — Addendum Note (Signed)
Encounter addended by: Ziare Cryder Mintz Earl Zellmer, RN on: 07/09/2012  5:57 PM<BR>     Documentation filed: Charges VN

## 2012-08-19 ENCOUNTER — Other Ambulatory Visit: Payer: PRIVATE HEALTH INSURANCE

## 2012-08-20 ENCOUNTER — Ambulatory Visit (HOSPITAL_COMMUNITY): Admission: RE | Admit: 2012-08-20 | Payer: PRIVATE HEALTH INSURANCE | Source: Ambulatory Visit

## 2012-08-23 ENCOUNTER — Ambulatory Visit (HOSPITAL_COMMUNITY)
Admission: RE | Admit: 2012-08-23 | Discharge: 2012-08-23 | Disposition: A | Discharge status: Home / Self Care | Payer: PRIVATE HEALTH INSURANCE | Source: Ambulatory Visit | Attending: Radiation Oncology | Admitting: Radiation Oncology

## 2012-08-23 DIAGNOSIS — Z923 Personal history of irradiation: Secondary | ICD-10-CM | POA: Insufficient documentation

## 2012-08-23 DIAGNOSIS — C719 Malignant neoplasm of brain, unspecified: Secondary | ICD-10-CM | POA: Insufficient documentation

## 2012-08-23 MED ORDER — GADOBENATE DIMEGLUMINE 529 MG/ML IV SOLN
14.0000 mL | Freq: Once | INTRAVENOUS | Status: AC | PRN
  Administered 2012-08-23: 14 mL via INTRAVENOUS

## 2012-08-24 ENCOUNTER — Encounter: Payer: Self-pay | Admitting: Radiation Oncology

## 2012-08-24 NOTE — Progress Notes (Signed)
  Radiation Oncology         [phone] ________________________________  Name: [name] MRN: [id number]  Date: 08/24/2012  DOB: [birth year]  Telephone contact:  I reviewed this patient's recent brain MRI. I called her cell phone to review the results.  I shared with her that the MRI shows no evidence to suggest recurrent brain tumor at this time. I explained that the cystic cavity which has been monitored on previous scans appears to be somewhat larger consistent with post radiation change. I also explained that there was a tiny 3 mm focus of enhancement adjacent to the cavity which will require careful followup.  At this point, I think he would be reasonable to pursue another followup brain MRI in 3 months. She will see medical oncology next week to review her current brain MRI and I will look forward to seeing her back after her next MRI in 3 months in the brain tumor clinic. ________________________________  Artist Pais. Kathrynn Running, M.D.

## 2012-08-26 ENCOUNTER — Other Ambulatory Visit: Payer: Self-pay | Admitting: Oncology

## 2012-08-31 ENCOUNTER — Ambulatory Visit (HOSPITAL_BASED_OUTPATIENT_CLINIC_OR_DEPARTMENT_OTHER): Payer: PRIVATE HEALTH INSURANCE | Admitting: Oncology

## 2012-08-31 ENCOUNTER — Telehealth: Payer: Self-pay | Admitting: *Deleted

## 2012-08-31 ENCOUNTER — Other Ambulatory Visit (HOSPITAL_BASED_OUTPATIENT_CLINIC_OR_DEPARTMENT_OTHER): Payer: PRIVATE HEALTH INSURANCE | Admitting: Lab

## 2012-08-31 VITALS — BP 145/94 | HR 91 | Temp 98.3°F | Resp 20 | Ht 64.0 in | Wt 151.4 lb

## 2012-08-31 DIAGNOSIS — C719 Malignant neoplasm of brain, unspecified: Secondary | ICD-10-CM

## 2012-08-31 DIAGNOSIS — C713 Malignant neoplasm of parietal lobe: Secondary | ICD-10-CM

## 2012-08-31 DIAGNOSIS — C50919 Malignant neoplasm of unspecified site of unspecified female breast: Secondary | ICD-10-CM

## 2012-08-31 DIAGNOSIS — Z923 Personal history of irradiation: Secondary | ICD-10-CM

## 2012-08-31 LAB — COMPREHENSIVE METABOLIC PANEL (CC13)
AST: 37 U/L — ABNORMAL HIGH (ref 5–34)
Albumin: 3.4 g/dL — ABNORMAL LOW (ref 3.5–5.0)
Alkaline Phosphatase: 126 U/L (ref 40–150)
Potassium: 4 mEq/L (ref 3.5–5.1)
Sodium: 141 mEq/L (ref 136–145)
Total Protein: 6.9 g/dL (ref 6.4–8.3)

## 2012-08-31 LAB — CBC WITH DIFFERENTIAL/PLATELET
EOS%: 3.9 % (ref 0.0–7.0)
MCH: 31.5 pg (ref 25.1–34.0)
MCHC: 33.9 g/dL (ref 31.5–36.0)
MCV: 93 fL (ref 79.5–101.0)
MONO%: 10.5 % (ref 0.0–14.0)
NEUT#: 1.5 10*3/uL (ref 1.5–6.5)
RBC: 3.68 10*6/uL — ABNORMAL LOW (ref 3.70–5.45)
RDW: 13.2 % (ref 11.2–14.5)

## 2012-08-31 MED ORDER — CLONAZEPAM 0.5 MG PO TABS: 0.5000 mg | ORAL_TABLET | Freq: Two times a day (BID) | ORAL | Status: DC | PRN

## 2012-08-31 MED ORDER — TRAMADOL HCL 50 MG PO TABS: 50.0000 mg | ORAL_TABLET | Freq: Four times a day (QID) | ORAL | Status: DC | PRN

## 2012-08-31 NOTE — Telephone Encounter (Signed)
appts was made and printed.the patient is aware that she would be called with her MRI appt.
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
## 2012-09-01 ENCOUNTER — Telehealth: Payer: Self-pay | Admitting: Oncology

## 2012-09-01 NOTE — Telephone Encounter (Signed)
S/w the pt and she is aware of her July appts. Pt is aware that she will be contacted regarding the July mri appt

## 2012-11-02 ENCOUNTER — Encounter: Payer: Self-pay | Admitting: Oncology

## 2012-11-02 NOTE — Progress Notes (Signed)
Faxed Case Manager the most recent note and the MRI report to [phone].

## 2012-11-26 ENCOUNTER — Other Ambulatory Visit: Payer: PRIVATE HEALTH INSURANCE

## 2012-11-29 ENCOUNTER — Ambulatory Visit: Payer: PRIVATE HEALTH INSURANCE | Admitting: Radiation Oncology

## 2012-11-29 ENCOUNTER — Other Ambulatory Visit: Payer: Self-pay | Admitting: Oncology

## 2012-11-29 DIAGNOSIS — C719 Malignant neoplasm of brain, unspecified: Secondary | ICD-10-CM

## 2012-11-29 DIAGNOSIS — Z923 Personal history of irradiation: Secondary | ICD-10-CM

## 2012-11-30 ENCOUNTER — Ambulatory Visit: Payer: PRIVATE HEALTH INSURANCE | Admitting: Oncology

## 2012-12-07 ENCOUNTER — Ambulatory Visit (HOSPITAL_COMMUNITY)
Admission: RE | Admit: 2012-12-07 | Discharge: 2012-12-07 | Disposition: A | Discharge status: Home / Self Care | Payer: PRIVATE HEALTH INSURANCE | Source: Ambulatory Visit | Attending: Oncology | Admitting: Oncology

## 2012-12-07 ENCOUNTER — Other Ambulatory Visit (HOSPITAL_BASED_OUTPATIENT_CLINIC_OR_DEPARTMENT_OTHER): Payer: PRIVATE HEALTH INSURANCE | Admitting: Lab

## 2012-12-07 DIAGNOSIS — C719 Malignant neoplasm of brain, unspecified: Secondary | ICD-10-CM

## 2012-12-07 DIAGNOSIS — C713 Malignant neoplasm of parietal lobe: Secondary | ICD-10-CM

## 2012-12-07 DIAGNOSIS — Z923 Personal history of irradiation: Secondary | ICD-10-CM

## 2012-12-07 LAB — COMPREHENSIVE METABOLIC PANEL (CC13)
AST: 56 U/L — ABNORMAL HIGH (ref 5–34)
Albumin: 3.2 g/dL — ABNORMAL LOW (ref 3.5–5.0)
Alkaline Phosphatase: 127 U/L (ref 40–150)
BUN: 12.2 mg/dL (ref 7.0–26.0)
Potassium: 4 mEq/L (ref 3.5–5.1)
Total Bilirubin: 1.08 mg/dL (ref 0.20–1.20)

## 2012-12-07 LAB — CBC WITH DIFFERENTIAL/PLATELET
Basophils Absolute: 0 10*3/uL (ref 0.0–0.1)
EOS%: 3.2 % (ref 0.0–7.0)
HGB: 11.9 g/dL (ref 11.6–15.9)
LYMPH%: 21.2 % (ref 14.0–49.7)
MCH: 31.9 pg (ref 25.1–34.0)
MCV: 92.9 fL (ref 79.5–101.0)
MONO%: 9.7 % (ref 0.0–14.0)
Platelets: 97 10*3/uL — ABNORMAL LOW (ref 145–400)
RBC: 3.72 10*6/uL (ref 3.70–5.45)
RDW: 13.7 % (ref 11.2–14.5)

## 2012-12-07 MED ORDER — GADOBENATE DIMEGLUMINE 529 MG/ML IV SOLN
15.0000 mL | Freq: Once | INTRAVENOUS | Status: AC | PRN
  Administered 2012-12-07: 15 mL via INTRAVENOUS

## 2012-12-13 ENCOUNTER — Ambulatory Visit
Admission: RE | Admit: 2012-12-13 | Discharge: 2012-12-13 | Disposition: A | Discharge status: Home / Self Care | Payer: PRIVATE HEALTH INSURANCE | Source: Ambulatory Visit | Attending: Radiation Oncology | Admitting: Radiation Oncology

## 2012-12-13 ENCOUNTER — Ambulatory Visit (HOSPITAL_COMMUNITY)
Admission: RE | Admit: 2012-12-13 | Discharge: 2012-12-13 | Disposition: A | Discharge status: Home / Self Care | Payer: PRIVATE HEALTH INSURANCE | Source: Ambulatory Visit | Attending: Radiation Oncology | Admitting: Radiation Oncology

## 2012-12-13 ENCOUNTER — Encounter: Payer: Self-pay | Admitting: Radiation Oncology

## 2012-12-13 ENCOUNTER — Ambulatory Visit
Admission: RE | Admit: 2012-12-13 | Payer: PRIVATE HEALTH INSURANCE | Source: Ambulatory Visit | Admitting: Radiation Oncology

## 2012-12-13 ENCOUNTER — Ambulatory Visit (HOSPITAL_BASED_OUTPATIENT_CLINIC_OR_DEPARTMENT_OTHER): Payer: PRIVATE HEALTH INSURANCE | Admitting: Oncology

## 2012-12-13 ENCOUNTER — Telehealth: Payer: Self-pay | Admitting: Oncology

## 2012-12-13 ENCOUNTER — Telehealth: Payer: Self-pay | Admitting: *Deleted

## 2012-12-13 VITALS — BP 115/76 | HR 88 | Temp 98.6°F | Resp 20 | Ht 64.0 in | Wt 147.7 lb

## 2012-12-13 VITALS — BP 114/79 | HR 100 | Temp 99.3°F | Resp 20

## 2012-12-13 DIAGNOSIS — C719 Malignant neoplasm of brain, unspecified: Secondary | ICD-10-CM | POA: Insufficient documentation

## 2012-12-13 DIAGNOSIS — C713 Malignant neoplasm of parietal lobe: Secondary | ICD-10-CM

## 2012-12-13 DIAGNOSIS — G40802 Other epilepsy, not intractable, without status epilepticus: Secondary | ICD-10-CM | POA: Insufficient documentation

## 2012-12-13 DIAGNOSIS — M25579 Pain in unspecified ankle and joints of unspecified foot: Secondary | ICD-10-CM | POA: Insufficient documentation

## 2012-12-13 DIAGNOSIS — Z9181 History of falling: Secondary | ICD-10-CM | POA: Insufficient documentation

## 2012-12-13 NOTE — Progress Notes (Signed)
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  The recent films were presented in our multidisciplinary conference with neuroradiology just prior to the clinic.  She had a seizure yesterday and fell injuring her foot.                              ALLERGIES:  is allergic to vasotec.  Meds: Current Outpatient Prescriptions  Medication Sig Dispense Refill  . clonazePAM (KLONOPIN) 0.5 MG tablet Take 1 tablet (0.5 mg total) by mouth 2 (two) times daily as needed for anxiety. For anxiety  30 tablet  3  . FLUoxetine (PROZAC) 20 MG tablet Take 30 mg by mouth 2 (two) times daily.        Marland Kitchen levETIRAcetam (KEPPRA) 500 MG tablet Take 1 tablet (500 mg total) by mouth every 12 (twelve) hours.  60 tablet  3  . phenytoin (DILANTIN) 100 MG ER capsule Take 100 mg by mouth 2 (two) times daily.      Marland Kitchen zolpidem (AMBIEN) 10 MG tablet TAKE 1 TABLET BY MOUTH EVERY NIGHT AT BEDTIME AS NEEDED FOR SLEEP  30 tablet  1  . cholecalciferol (VITAMIN D) 400 UNITS TABS Take 1,000 Units by mouth as needed.       . diazepam (VALIUM) 5 MG tablet Take 1-2 tabs PO, 30 minutes prior to MRI  2 tablet  0  . ferrous sulfate 325 (65 FE) MG tablet Take 325 mg by mouth daily with breakfast.       . folic acid (FOLVITE) 1 MG tablet Take 1 mg by mouth daily.        . Multiple Vitamin (MULTIVITAMIN) tablet Take 1 tablet by mouth daily.        Marland Kitchen oxyCODONE (OXY  IR/ROXICODONE) 5 MG immediate release tablet Take 1 tablet (5 mg total) by mouth every 8 (eight) hours as needed for pain. 1-2 tabs po q 4 hr prn pain  30 tablet  0  . potassium chloride (KLOR-CON) 20 MEQ packet Take 20 mEq by mouth daily.  30 tablet  3  . prochlorperazine (COMPAZINE) 10 MG tablet Take 10 mg by mouth every 6 (six) hours as needed. For nausea      . traMADol (ULTRAM) 50 MG tablet Take 1 tablet (50 mg total) by mouth every 6 (six) hours as needed. Maximum dose= 8 tablets per day  60 tablet  3   No current facility-administered medications for this encounter.    Physical Findings: The patient is in no acute distress. Patient is alert and oriented.  oral temperature is 99.3 F (37.4 C). Her blood pressure is 114/79 and her pulse is 100. Her respiration is 20 and oxygen saturation is 97%. . Left foot swollen and tender. No significant changes.  Lab Findings: Lab Results  Component Value Date   WBC 3.3* 12/07/2012   HGB 11.9 12/07/2012   HCT  34.6* 12/07/2012   MCV 92.9 12/07/2012   PLT 97* 12/07/2012    @LASTCHEM @  Radiographic Findings: Mr Laqueta Jean ZO Contrast  12/07/2012   *RADIOLOGY REPORT*  Clinical Data: History of astrocytoma.  No new symptoms.  MRI HEAD WITHOUT AND WITH CONTRAST  Technique:  Multiplanar, multiecho pulse sequences of the brain and surrounding structures were obtained according to standard protocol without and with intravenous contrast  Contrast: 15mL MULTIHANCE GADOBENATE DIMEGLUMINE 529 MG/ML IV SOLN  Comparison:  Multiple priors.  Most recent 08/23/2012.  Findings: Gradual but definite enlargement of the cystic cavity at the site of previous tumor resection.  Cross-sectional measurements today on T1 post contrast images are 36 x 33 x 40 mm.  In addition to the small stable 3 mm focus of enhancement inferolaterally noted on prior studies, there is new/increased enhancement of the cyst wall extending into the deeper white matter of the brain (image 9 series 11 for  instance).  No restricted diffusion or significant surrounding edema, but layering proteinaceous material and/or blood products within the cyst is redemonstrated.  Concern raised for recurrent astrocytoma.  Stable moderate atrophy and extensive white matter signal abnormality representing postradiation change. Unchanged right dural enhancement with minimal extra-axial fluid.  No areas of enhancement remote from the surgical site.  IMPRESSION: Gradual but definite enlargement of the cystic cavity at the site of tumor resection, now 36 x 33 x 40.  Despite the negative metabolic brain scan 06/29/2012, concern raised for recurrent tumor. Correlate clinically.   Original Report Authenticated By: Davonna Belling, M.D.    Impression:  The patient has an enlarging cyst at her tumor site which was hypometabolic in January.  This could represent recurrent tumor vs post-radiation cystic necrosis.  Plan:  MRI in 3 months then follow-up.  I will order films of the left foot today.  She sees Dr. Darnelle Catalan today.    _____________________________________  Artist Pais. Kathrynn Running, M.D.

## 2012-12-13 NOTE — Telephone Encounter (Signed)
Called patient home, she had 1130 am appt today, spoke with patient, she sprained her left foot yesterday "not the bad foot;, forgot about todays appt, but having hard time getting up and around, cancelled todays and will reschedule, she also cancelled Dr.Magrinat visit today as well, trasnferred call to [place] [place], and notified Dr.Manning, and Percell Boston 12:01 PM

## 2012-12-13 NOTE — Progress Notes (Addendum)
Follow up [facility]  MRI brain 12/07/12, patient came in had spoke earlier and was encouraged to come see MD today, patient had fallen yesterday,feels she had a seizure on couch  Was alone and wet herself, tried to get up by herself and fell on her left foot and it bent back, it is swollen ,didn'yt get an xray as yet, tremors in hands, offered cola, patient has a frontal head ache a 6 on 1-10 pain scale, hasn;t taken anything for this, was out of her Keppra tablets about 3 days, then restarted last night took one and this morning, also took dilantin 100mg   X 2 tabs last night stated, tearful today, hasn't taken pain meds rx or OTC, has oxycodone IR 5mg  prn, yesterday morning was nauseated, had chills on couch  Hard to get around on her walker last night from shaking so bad,in w/c, declined weight, vitals enl, 97% room air 1:46 PM 1:46 PM
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
## 2012-12-13 NOTE — Telephone Encounter (Signed)
gv pt appt calendar for 8/19 appt. Not able to print AVS due to discharge order reconcilliation is not complete for this contact.

## 2012-12-14 ENCOUNTER — Telehealth: Payer: Self-pay | Admitting: Radiation Oncology

## 2012-12-14 NOTE — Telephone Encounter (Signed)
Message copied by Agnes Lawrence on Tue Dec 14, 2012  4:05 PM ------      Message from: [place], [place]      Created: Tue Dec 14, 2012  2:21 PM       Sam,            Please call Ms. Rudie.  Let her know the x-ray shows no obvious fracture.  If her foot continues to bother her, we will need to get her back to Dr. Carola Frost or one of his colleagues for follow-up.            Thanks.            MM ------

## 2012-12-14 NOTE — Telephone Encounter (Signed)
Phoned patient as ordered by Dr. Kathrynn Running. Explained that the xray of her foot showed no obvious fracture. Went on to explain that if her foot continued to bother her we will need to get her back to Dr. Carola Frost. She expressed understanding.

## 2012-12-15 ENCOUNTER — Other Ambulatory Visit: Payer: Self-pay | Admitting: *Deleted

## 2012-12-15 ENCOUNTER — Encounter: Payer: Self-pay | Admitting: *Deleted

## 2012-12-15 DIAGNOSIS — C719 Malignant neoplasm of brain, unspecified: Secondary | ICD-10-CM

## 2012-12-15 MED ORDER — PHENYTOIN SODIUM EXTENDED 100 MG PO CAPS: 200.0000 mg | ORAL_CAPSULE | Freq: Two times a day (BID) | ORAL | Status: DC

## 2012-12-15 NOTE — Progress Notes (Signed)
[facility] Clinical Social Work  Clinical Social Work was referred by Systems developer for assessment of home care needs.  Clinical Social Worker contacted patient's spouse to offer support and assess for needs.  Mr. Khalsa shares that he plans to meet with a home health agency today at patient's home to evaluate for potential services.  CSW and Mr. Deblanc discussed possible home care services covered by insurance.  Mr. Vialpando stated they will not be able to pay for home care services out of pocket, therefore, a home care aid is not an option at this time.  CSW briefly discussed patient attending short term rehab at a skilled nursing facility.  Mr. Bajaj was interested in this opportunity to allow patient to regain some strength, have 24/7 nursing care/supervision, and allow him to be able to go back to work.  Mr. Prange plans to meet with the home health agency today and contact his insurance provider regarding skilled nursing coverage.  CSW will follow up with patient's spouse to assist in processing options to best meet the current needs of the patient.    Kathrin Penner, MSW, LCSW Clinical Social Worker [facility] [phone]

## 2012-12-29 ENCOUNTER — Telehealth: Payer: Self-pay | Admitting: *Deleted

## 2012-12-29 ENCOUNTER — Other Ambulatory Visit: Payer: Self-pay | Admitting: Radiation Therapy

## 2012-12-29 DIAGNOSIS — C719 Malignant neoplasm of brain, unspecified: Secondary | ICD-10-CM

## 2012-12-29 NOTE — Telephone Encounter (Signed)
Rec'd message from [place], [place] Physical Therapist with [facility] that patient fell out of bed this morning, patient is ok and has no apparent injuries. Denies any need for further medical evaluation. Just wanted to make Dr Darnelle Catalan aware.

## 2013-01-18 ENCOUNTER — Ambulatory Visit: Payer: PRIVATE HEALTH INSURANCE | Admitting: Physician Assistant

## 2013-01-18 ENCOUNTER — Encounter: Payer: Self-pay | Admitting: Physician Assistant

## 2013-01-18 ENCOUNTER — Other Ambulatory Visit: Payer: PRIVATE HEALTH INSURANCE | Admitting: Lab

## 2013-01-18 NOTE — Progress Notes (Signed)
[facility] today.  Letter mailed to patient.   Zollie Scale, PA-C 01/18/2013

## 2013-01-21 ENCOUNTER — Telehealth: Payer: Self-pay | Admitting: Radiation Oncology

## 2013-01-21 ENCOUNTER — Encounter: Payer: Self-pay | Admitting: Radiation Therapy

## 2013-01-21 NOTE — Telephone Encounter (Signed)
Late entry from 01/19/2013 at 1600. Received call from Sharyl Nimrod, RN in medical oncology. She reports that she phoned this patient to review appointment dates and times. Sharyl Nimrod expressed concern that the patient seemed very confused and questioned if her brain scan should be moved up. Expressed this concern to Irwin Brakeman who plans to contact this patient.

## 2013-01-21 NOTE — Progress Notes (Signed)
There was some concern for Shizue expressed by the staff here at [facility], that she was very confused when  she was last seen. I was asked to call and check in to see how she is doing, since her next MRI is not scheduled until the beginning of October. I called her husband to see how she is and to ask if there has been a change in her behavior. He said that she is doing the same, "since this all began, she has been easily confused with dates and numbers." He says she will write things down wrong or put the information away and not remember where she placed it. However, he does not feel there has been a change in her confusion.     I let Mr. Bankson know to call me if he does notice a change in her behavior. I will be happy to move the MRI up sooner if it is necessary. He expressed understanding and knows to give me a call if something comes up.  Jalene Mullet R.T.(R)(T) Special Procedures Navigator

## 2013-01-25 ENCOUNTER — Other Ambulatory Visit: Payer: Self-pay | Admitting: *Deleted

## 2013-01-25 DIAGNOSIS — C719 Malignant neoplasm of brain, unspecified: Secondary | ICD-10-CM

## 2013-01-25 MED ORDER — LEVETIRACETAM 500 MG PO TABS: 500.0000 mg | ORAL_TABLET | Freq: Two times a day (BID) | ORAL | Status: DC

## 2013-02-10 ENCOUNTER — Ambulatory Visit: Payer: PRIVATE HEALTH INSURANCE | Admitting: Radiation Oncology

## 2013-03-03 ENCOUNTER — Other Ambulatory Visit: Payer: Self-pay

## 2013-03-03 DIAGNOSIS — Z923 Personal history of irradiation: Secondary | ICD-10-CM

## 2013-03-03 DIAGNOSIS — C719 Malignant neoplasm of brain, unspecified: Secondary | ICD-10-CM

## 2013-03-03 MED ORDER — CLONAZEPAM 0.5 MG PO TABS: 0.5000 mg | ORAL_TABLET | Freq: Two times a day (BID) | ORAL | Status: DC | PRN

## 2013-03-04 ENCOUNTER — Ambulatory Visit
Admission: RE | Admit: 2013-03-04 | Discharge: 2013-03-04 | Disposition: A | Discharge status: Home / Self Care | Payer: PRIVATE HEALTH INSURANCE | Source: Ambulatory Visit | Attending: Radiation Oncology | Admitting: Radiation Oncology

## 2013-03-04 ENCOUNTER — Other Ambulatory Visit: Payer: PRIVATE HEALTH INSURANCE

## 2013-03-04 DIAGNOSIS — C719 Malignant neoplasm of brain, unspecified: Secondary | ICD-10-CM

## 2013-03-04 MED ORDER — GADOBENATE DIMEGLUMINE 529 MG/ML IV SOLN
13.0000 mL | Freq: Once | INTRAVENOUS | Status: AC | PRN
  Administered 2013-03-04: 13 mL via INTRAVENOUS

## 2013-03-06 ENCOUNTER — Encounter: Payer: Self-pay | Admitting: Radiation Oncology

## 2013-03-06 NOTE — Progress Notes (Signed)
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  The recent films were presented in our multidisciplinary conference with neuroradiology just prior to the clinic.  Her cyst is larger and note was made about a possible small right-sided ICA aneurysm.                             ALLERGIES:  is allergic to vasotec.  Meds: Current Outpatient Prescriptions  Medication Sig Dispense Refill  . clonazePAM (KLONOPIN) 0.5 MG tablet Take 1 tablet (0.5 mg total) by mouth 2 (two) times daily as needed for anxiety. For anxiety  30 tablet  0  . FLUoxetine (PROZAC) 20 MG tablet Take 30 mg by mouth 2 (two) times daily.        Marland Kitchen levETIRAcetam (KEPPRA) 500 MG tablet Take 1 tablet (500 mg total) by mouth every 12 (twelve) hours.  60 tablet  5  . Multiple Vitamin (MULTIVITAMIN) tablet Take 1 tablet by mouth daily.        . traMADol (ULTRAM) 50 MG tablet Take 1 tablet (50 mg total) by mouth every 6 (six) hours as needed. Maximum dose= 8 tablets per day  60 tablet  3  . zolpidem (AMBIEN) 10 MG tablet TAKE 1 TABLET BY MOUTH EVERY NIGHT AT BEDTIME AS NEEDED FOR SLEEP  30 tablet  1  . cholecalciferol (VITAMIN D) 400 UNITS TABS Take 1,000 Units by mouth as needed.       . diazepam (VALIUM) 5 MG tablet Take 1-2 tabs PO, 30 minutes prior to MRI  2 tablet  0  . ferrous sulfate 325 (65 FE) MG tablet Take 325 mg by mouth daily with  breakfast.       . folic acid (FOLVITE) 1 MG tablet Take 1 mg by mouth daily.        Marland Kitchen oxyCODONE (OXY IR/ROXICODONE) 5 MG immediate release tablet Take 1 tablet (5 mg total) by mouth every 8 (eight) hours as needed for pain. 1-2 tabs po q 4 hr prn pain  30 tablet  0  . phenytoin (DILANTIN) 100 MG ER capsule Take 2 capsules (200 mg total) by mouth 2 (two) times daily.  120 capsule  3  . potassium chloride (KLOR-CON) 20 MEQ packet Take 20 mEq by mouth daily.  30 tablet  3  . prochlorperazine (COMPAZINE) 10 MG tablet Take 10 mg by mouth every 6 (six) hours as needed. For nausea       No current facility-administered medications for this encounter.    Physical Findings: The patient is in no acute distress. Patient is alert and oriented.  weight is 141 lb 9.6 oz (64.229 kg). Her blood pressure is 135/79 and her pulse is 65. Her respiration is 16. .  No significant changes.  Lab Findings: Lab Results  Component Value Date   WBC 3.3* 12/07/2012   HGB 11.9 12/07/2012  HCT 34.6* 12/07/2012   MCV 92.9 12/07/2012   PLT 97* 12/07/2012    @LASTCHEM @  Radiographic Findings: Mr Lodema Pilot Contrast  03/04/2013   CLINICAL DATA:  52 year old female with history of astrocytoma status post surgery and radiation therapy. Subsequent slow cystic enlargement at the treatment site.  EXAM: MRI HEAD WITHOUT AND WITH CONTRAST  TECHNIQUE: Multiplanar, multiecho pulse sequences of the brain and surrounding structures were obtained according to standard protocol without and with intravenous contrast  CONTRAST:  13mL MULTIHANCE GADOBENATE DIMEGLUMINE 529 MG/ML IV SOLN  COMPARISON:  Seventy-two thousand fourteen and earlier.  FINDINGS: Once again there seems to be subtle interval enlargement of the cystic cavity containing a fluid fluid level in the right parietal lobe. Today this measures 39-40 mm diameter (36-39 mm in July, 32 to 36 mm and March). No significant change in regional mass effect (see series 9, image 18). As before  the cavity has a thin T2 hyperintense wall, dark T2* and T2 and low to intermediate T1 signal layering internal content, and no central or rim enhancement. There is a stable small nodular focus of enhancement along the antral lateral aspect of the cyst (series 10, image 90), and this appears decreased in size, though more distinct compared to 08/23/2012. No associated diffusion restriction.  Stable mild to moderate somewhat generalized right central and posterior hemisphere white matter T2 and FLAIR hyperintensity. No new signal abnormality identified.  Stable right posterior craniotomy changes with small volume underlying extra-axial collection. Stable mild dural thickening and enhancement in the right hemisphere, also likely postoperative. Stable ventricle size and configuration. No restricted diffusion or evidence of acute infarction. No acute intracranial hemorrhage identified. Negative pituitary and cervicomedullary junction. Negative visualized cervical spine.  Major intracranial vascular flow voids are stable. However, there is suggestion of a 6 mm chronic right supraclinoid ICA aneurysm (series 6, image 11). No prior MRA or CTA for comparison.  Visualized orbit soft tissues are within normal limits. Stable paranasal sinuses and mastoids. Negative scalp soft tissues. Normal bone marrow signal.  IMPRESSION: 1. Continued slowly enlarging chronic cystic cavity with a fluid-fluid level occupying the right parietal lobe treatment site. In light of negative PET-CT earlier this year and lack of significant enhancement, consider delayed cyst formation as a sequelae of previous radiosurgery.  2. Stable small nodular focus of enhancement just lateral to the cyst (series 10, image 90). Stable overlying postoperative changes.  3. 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 Follow up MRA (without contrast) or CTA (with contrast) the of the head would confirm.   Electronically Signed    By: Augusto Gamble M.D.   On: 03/04/2013 16:50   Impression:  The patient has an enlarging cyst and a possible right ICA aneurysm.  Plan:  Refer back to [facility] to consider aspiration of the cyst and possibly shunt.  Following intervention, would suggest adding MRA sequence to assess possible aneurysm.  _____________________________________  Artist Pais Kathrynn Running, M.D.

## 2013-03-07 ENCOUNTER — Ambulatory Visit
Admission: RE | Admit: 2013-03-07 | Discharge: 2013-03-07 | Disposition: A | Discharge status: Home / Self Care | Payer: PRIVATE HEALTH INSURANCE | Source: Ambulatory Visit | Attending: Radiation Oncology | Admitting: Radiation Oncology

## 2013-03-07 ENCOUNTER — Encounter: Payer: Self-pay | Admitting: Radiation Oncology

## 2013-03-07 VITALS — BP 135/79 | HR 65 | Resp 16 | Wt 141.6 lb

## 2013-03-07 DIAGNOSIS — C719 Malignant neoplasm of brain, unspecified: Secondary | ICD-10-CM

## 2013-03-07 NOTE — Progress Notes (Signed)
Denies taking decadron or dilantin. Reports taking keppra as directed. Recall very slow. Mild expressive asphagia noted. Reports regular occipital headache that occasionally are severe enough she takes oxycodone. Unsteady off balance gait reported. Reports using the aid of a walker at home. Decreased short term memory. Denies diplopia or ringing in the ears. Denies nausea or vomiting.

## 2013-03-11 ENCOUNTER — Telehealth: Payer: Self-pay | Admitting: *Deleted

## 2013-03-11 NOTE — Telephone Encounter (Signed)
CALLED PATIENT TO INFORM OF APPT. WITH DR. Jeral Fruit ON 03-14-13- ARRIVAL TIME- 12:45 P.M., SPOKE WITH PATIENT AND SHE IS AWARE OF THIS APPT.

## 2013-04-06 ENCOUNTER — Other Ambulatory Visit: Payer: Self-pay | Admitting: Neurosurgery

## 2013-04-06 DIAGNOSIS — I671 Cerebral aneurysm, nonruptured: Secondary | ICD-10-CM

## 2013-04-07 ENCOUNTER — Other Ambulatory Visit: Payer: Self-pay

## 2013-04-11 ENCOUNTER — Ambulatory Visit
Admission: RE | Admit: 2013-04-11 | Discharge: 2013-04-11 | Disposition: A | Discharge status: Home / Self Care | Payer: PRIVATE HEALTH INSURANCE | Source: Ambulatory Visit | Attending: Neurosurgery | Admitting: Neurosurgery

## 2013-04-11 DIAGNOSIS — I671 Cerebral aneurysm, nonruptured: Secondary | ICD-10-CM

## 2013-04-11 MED ORDER — IOHEXOL 350 MG/ML SOLN
100.0000 mL | Freq: Once | INTRAVENOUS | Status: AC | PRN
  Administered 2013-04-11: 100 mL via INTRAVENOUS

## 2013-04-18 ENCOUNTER — Emergency Department (HOSPITAL_COMMUNITY): Payer: PRIVATE HEALTH INSURANCE

## 2013-04-18 ENCOUNTER — Encounter (HOSPITAL_COMMUNITY): Payer: Self-pay | Admitting: Emergency Medicine

## 2013-04-18 ENCOUNTER — Inpatient Hospital Stay (HOSPITAL_COMMUNITY)
Admission: EM | Admit: 2013-04-18 | Discharge: 2013-05-05 | DRG: 020 | Disposition: A | Discharge status: Rehabilitation Facility | Payer: PRIVATE HEALTH INSURANCE | Attending: Neurosurgery | Admitting: Neurosurgery

## 2013-04-18 DIAGNOSIS — I729 Aneurysm of unspecified site: Secondary | ICD-10-CM

## 2013-04-18 DIAGNOSIS — Y92009 Unspecified place in unspecified non-institutional (private) residence as the place of occurrence of the external cause: Secondary | ICD-10-CM

## 2013-04-18 DIAGNOSIS — Z85841 Personal history of malignant neoplasm of brain: Secondary | ICD-10-CM

## 2013-04-18 DIAGNOSIS — I69992 Facial weakness following unspecified cerebrovascular disease: Secondary | ICD-10-CM

## 2013-04-18 DIAGNOSIS — Z923 Personal history of irradiation: Secondary | ICD-10-CM

## 2013-04-18 DIAGNOSIS — Z9221 Personal history of antineoplastic chemotherapy: Secondary | ICD-10-CM

## 2013-04-18 DIAGNOSIS — F411 Generalized anxiety disorder: Secondary | ICD-10-CM | POA: Diagnosis present

## 2013-04-18 DIAGNOSIS — F3289 Other specified depressive episodes: Secondary | ICD-10-CM | POA: Diagnosis present

## 2013-04-18 DIAGNOSIS — C719 Malignant neoplasm of brain, unspecified: Secondary | ICD-10-CM

## 2013-04-18 DIAGNOSIS — I69922 Dysarthria following unspecified cerebrovascular disease: Secondary | ICD-10-CM

## 2013-04-18 DIAGNOSIS — I6350 Cerebral infarction due to unspecified occlusion or stenosis of unspecified cerebral artery: Secondary | ICD-10-CM | POA: Diagnosis present

## 2013-04-18 DIAGNOSIS — R569 Unspecified convulsions: Secondary | ICD-10-CM

## 2013-04-18 DIAGNOSIS — G8110 Spastic hemiplegia affecting unspecified side: Secondary | ICD-10-CM | POA: Diagnosis present

## 2013-04-18 DIAGNOSIS — Z803 Family history of malignant neoplasm of breast: Secondary | ICD-10-CM

## 2013-04-18 DIAGNOSIS — R1312 Dysphagia, oropharyngeal phase: Secondary | ICD-10-CM | POA: Diagnosis present

## 2013-04-18 DIAGNOSIS — E43 Unspecified severe protein-calorie malnutrition: Secondary | ICD-10-CM | POA: Insufficient documentation

## 2013-04-18 DIAGNOSIS — I609 Nontraumatic subarachnoid hemorrhage, unspecified: Principal | ICD-10-CM

## 2013-04-18 DIAGNOSIS — W19XXXA Unspecified fall, initial encounter: Secondary | ICD-10-CM | POA: Diagnosis present

## 2013-04-18 DIAGNOSIS — G40109 Localization-related (focal) (partial) symptomatic epilepsy and epileptic syndromes with simple partial seizures, not intractable, without status epilepticus: Secondary | ICD-10-CM | POA: Diagnosis present

## 2013-04-18 DIAGNOSIS — S065X0A Traumatic subdural hemorrhage without loss of consciousness, initial encounter: Secondary | ICD-10-CM | POA: Diagnosis present

## 2013-04-18 DIAGNOSIS — I671 Cerebral aneurysm, nonruptured: Secondary | ICD-10-CM

## 2013-04-18 DIAGNOSIS — IMO0002 Reserved for concepts with insufficient information to code with codable children: Secondary | ICD-10-CM

## 2013-04-18 DIAGNOSIS — T50995A Adverse effect of other drugs, medicaments and biological substances, initial encounter: Secondary | ICD-10-CM | POA: Diagnosis present

## 2013-04-18 DIAGNOSIS — F329 Major depressive disorder, single episode, unspecified: Secondary | ICD-10-CM | POA: Diagnosis present

## 2013-04-18 DIAGNOSIS — Z888 Allergy status to other drugs, medicaments and biological substances status: Secondary | ICD-10-CM

## 2013-04-18 DIAGNOSIS — I959 Hypotension, unspecified: Secondary | ICD-10-CM | POA: Diagnosis present

## 2013-04-18 DIAGNOSIS — R410 Disorientation, unspecified: Secondary | ICD-10-CM

## 2013-04-18 LAB — COMPREHENSIVE METABOLIC PANEL
ALT: 9 U/L (ref 0–35)
AST: 19 U/L (ref 0–37)
Albumin: 3.5 g/dL (ref 3.5–5.2)
Alkaline Phosphatase: 91 U/L (ref 39–117)
BUN: 25 mg/dL — ABNORMAL HIGH (ref 6–23)
Calcium: 10.6 mg/dL — ABNORMAL HIGH (ref 8.4–10.5)
Chloride: 98 mEq/L (ref 96–112)
Creatinine, Ser: 1.34 mg/dL — ABNORMAL HIGH (ref 0.50–1.10)
GFR calc non Af Amer: 44 mL/min — ABNORMAL LOW (ref >90)
Sodium: 138 mEq/L (ref 135–145)
Total Bilirubin: 1.3 mg/dL — ABNORMAL HIGH (ref 0.3–1.2)
Total Protein: 8 g/dL (ref 6.0–8.3)

## 2013-04-18 LAB — PROTIME-INR
INR: 1.09 (ref 0.00–1.49)
Prothrombin Time: 13.9 seconds (ref 11.6–15.2)

## 2013-04-18 LAB — CBC WITH DIFFERENTIAL/PLATELET
Basophils Absolute: 0 10*3/uL (ref 0.0–0.1)
Basophils Relative: 0 % (ref 0–1)
Eosinophils Absolute: 0 10*3/uL (ref 0.0–0.7)
Eosinophils Relative: 1 % (ref 0–5)
HCT: 34.7 % — ABNORMAL LOW (ref 36.0–46.0)
Lymphocytes Relative: 21 % (ref 12–46)
MCH: 31.3 pg (ref 26.0–34.0)
MCHC: 35.4 g/dL (ref 30.0–36.0)
MCV: 88.3 fL (ref 78.0–100.0)
Monocytes Absolute: 0.8 10*3/uL (ref 0.1–1.0)
Monocytes Relative: 15 % — ABNORMAL HIGH (ref 3–12)
Neutro Abs: 3.1 10*3/uL (ref 1.7–7.7)
Platelets: 121 10*3/uL — ABNORMAL LOW (ref 150–400)
RBC: 3.93 MIL/uL (ref 3.87–5.11)
RDW: 12.7 % (ref 11.5–15.5)
WBC: 5 10*3/uL (ref 4.0–10.5)

## 2013-04-18 LAB — APTT: aPTT: 28 seconds (ref 24–37)

## 2013-04-18 MED ORDER — SODIUM CHLORIDE 0.9 % IV SOLN
Freq: Once | INTRAVENOUS | Status: AC
  Administered 2013-04-18: 16:00:00 via INTRAVENOUS

## 2013-04-18 MED ORDER — FLUOXETINE HCL 20 MG PO TABS
30.0000 mg | ORAL_TABLET | Freq: Two times a day (BID) | ORAL | Status: DC
  Filled 2013-04-18: qty 2

## 2013-04-18 MED ORDER — DEXAMETHASONE SODIUM PHOSPHATE 4 MG/ML IJ SOLN
4.0000 mg | Freq: Four times a day (QID) | INTRAMUSCULAR | Status: DC
  Administered 2013-04-19 – 2013-04-26 (×31): 4 mg via INTRAVENOUS
  Filled 2013-04-18 (×36): qty 1

## 2013-04-18 MED ORDER — ADULT MULTIVITAMIN W/MINERALS CH
1.0000 | ORAL_TABLET | Freq: Every day | ORAL | Status: DC
  Administered 2013-04-18 – 2013-05-05 (×14): 1 via ORAL
  Filled 2013-04-18 (×18): qty 1

## 2013-04-18 MED ORDER — FLUOXETINE HCL 20 MG PO CAPS
30.0000 mg | ORAL_CAPSULE | Freq: Two times a day (BID) | ORAL | Status: DC
  Administered 2013-04-18 – 2013-04-26 (×16): 30 mg via ORAL
  Filled 2013-04-18 (×17): qty 1

## 2013-04-18 MED ORDER — SODIUM CHLORIDE 0.9 % IV SOLN
Freq: Once | INTRAVENOUS | Status: AC
  Administered 2013-04-18: 13:00:00 via INTRAVENOUS

## 2013-04-18 MED ORDER — ACETAMINOPHEN 325 MG PO TABS
650.0000 mg | ORAL_TABLET | ORAL | Status: DC | PRN
  Administered 2013-04-18 – 2013-04-24 (×2): 650 mg via ORAL
  Filled 2013-04-18 (×2): qty 2

## 2013-04-18 MED ORDER — PHENYTOIN SODIUM EXTENDED 100 MG PO CAPS
200.0000 mg | ORAL_CAPSULE | Freq: Two times a day (BID) | ORAL | Status: DC
  Administered 2013-04-18 – 2013-04-22 (×8): 200 mg via ORAL
  Filled 2013-04-18 (×9): qty 2

## 2013-04-18 MED ORDER — ONE-DAILY MULTI VITAMINS PO TABS: 1.0000 | ORAL_TABLET | Freq: Every day | ORAL | Status: DC

## 2013-04-18 MED ORDER — PANTOPRAZOLE SODIUM 40 MG PO TBEC
40.0000 mg | DELAYED_RELEASE_TABLET | Freq: Every day | ORAL | Status: DC
  Administered 2013-04-18 – 2013-05-05 (×17): 40 mg via ORAL
  Filled 2013-04-18 (×18): qty 1

## 2013-04-18 MED ORDER — LEVETIRACETAM 500 MG PO TABS
500.0000 mg | ORAL_TABLET | Freq: Two times a day (BID) | ORAL | Status: DC
  Administered 2013-04-18 – 2013-04-20 (×4): 500 mg via ORAL
  Filled 2013-04-18 (×5): qty 1

## 2013-04-18 NOTE — ED Notes (Signed)
Pt was dx with brain tumor 2 years ago.  Monday pt had CTA which showed brain aneurism.  Since Tues, pt has been increasingly lethargic with slurred speech and a L facial droop.

## 2013-04-18 NOTE — ED Provider Notes (Signed)
CSN: [id number]     Arrival date & time 04/18/13  1015 History   First MD Initiated Contact with Patient 04/18/13 1037     Chief Complaint  Patient presents with  . 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 The patient reports falling and hitting her head against the mattress.  Her best friend reports the patient was able to ambulate with a cane last week but she has gradually declined and she is having trouble walking with her walker due to balance and ataxia and coordination issues.  She reports an increase in Right sided occipital headache that has changed from her baseline chronic headache. She has reported several days of nausea with 5 episodes of vomiting.  Her spouse reports that she has had trouble swallowing her pills for the past day or two. Her best friend also reports an increase in her facial droop over the past week.     The history is provided by the patient, a friend and the spouse. No language interpreter was used.    Past Medical History  Diagnosis Date  . 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 Fracture, ankle 2012    right  . History of radiation therapy 09/23/10- 11/07/10    right parietal lobe  . Chronic headaches     uses oxycodone appropriately   Past Surgical History  Procedure Laterality Date  . Orif ankle fracture  08/22/2010    right   Family History  Problem Relation Age of Onset  . Breast cancer Mother    History  Substance Use Topics  . Smoking status: Never Smoker   . Smokeless tobacco: Never Used  . Alcohol Use: 0.0 oz/week    3-4 Cans of beer per week   OB History   Grav Para Term Preterm Abortions TAB SAB Ect Mult Living                  Review of Systems  Allergies  Vasotec  Home Medications   Current Outpatient Rx  Name  Route  Sig  Dispense  Refill  . zolpidem (AMBIEN) 10 MG tablet   Oral   Take 10 mg by mouth at bedtime as needed for sleep.         . clonazePAM (KLONOPIN) 0.5 MG tablet   Oral   Take 1 tablet (0.5 mg total) by mouth 2 (two) times daily as needed for anxiety. For anxiety   30 tablet   0   . diazepam (VALIUM) 5 MG tablet      Take 1-2 tabs PO, 30 minutes prior to MRI   2 tablet   0   . FLUoxetine (PROZAC) 20 MG tablet   Oral   Take 30 mg by mouth 2 (two) times daily.           Marland Kitchen levETIRAcetam (KEPPRA) 500 MG tablet   Oral   Take 1 tablet (500 mg total) by mouth every 12 (twelve) hours.   60 tablet   5   . Multiple Vitamin (MULTIVITAMIN) tablet   Oral   Take 1 tablet by mouth daily.           Marland Kitchen oxyCODONE (OXY IR/ROXICODONE) 5 MG immediate release tablet   Oral   Take 1 tablet (5 mg total) by  mouth every 8 (eight) hours as needed for pain. 1-2 tabs po q 4 hr prn pain   30 tablet   0   . phenytoin (DILANTIN) 100 MG ER capsule   Oral   Take 2 capsules (200 mg total) by mouth 2 (two) times daily.   120 capsule   3   . EXPIRED: potassium chloride (KLOR-CON) 20 MEQ packet   Oral   Take 20 mEq by mouth daily.   30 tablet   3   . prochlorperazine (COMPAZINE) 10 MG tablet   Oral   Take 10 mg by mouth every 6 (six) hours as needed. For nausea         . traMADol (ULTRAM) 50 MG tablet   Oral   Take 1 tablet (50 mg total) by mouth every 6 (six) hours as needed. Maximum dose= 8 tablets per day   60 tablet   3    BP 162/95  Pulse 78  Temp(Src) 99.8 F (37.7 C) (Oral)  Resp 22  SpO2 100%  LMP 08/26/2010 Physical Exam  Nursing note and vitals reviewed. Constitutional: She appears well-developed. No distress.  HENT:  Head: Normocephalic and atraumatic.  Mouth/Throat: Mucous membranes are dry. No oropharyngeal exudate.  Neck: Neck supple.  Cardiovascular:  Normal rate and regular rhythm.   Pulmonary/Chest: Effort normal and breath sounds normal. No respiratory distress. She has no wheezes.  Abdominal: Soft. Bowel sounds are normal. She exhibits no distension. There is no tenderness. There is no rebound and no guarding.  Musculoskeletal: She exhibits no edema.  Neurological: She is alert. She is not disoriented. She displays no tremor. A cranial nerve deficit is present. GCS eye subscore is 4. GCS verbal subscore is 5. GCS motor subscore is 6.  Slow to answer. Positive facial droop. Left UE weakness, LLE weakness.  Skin: Skin is warm and dry. She is not diaphoretic.    ED Course  Procedures (including critical care time) Labs Review Labs Reviewed - No data to display Imaging Review No results found.  EKG Interpretation   None       MDM  No diagnosis found. Followed by Oneita Hurt. Dr. Jeral Fruit for cyst aspiration    CLINICAL DATA: Previous astrocytoma resection. Suspicion of  aneurysm at MRI.  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 Posterior lobe measures 8 x 5 mm and anterior lobe  measures 5-6 mm in diameter. Both lobes appear to have wide-mouth  communication with the main vessel.  Electronically Signed  By: Paulina Fusi M.D.  On: 04/11/2013 16:46   Clabe Seal, PA-C 04/20/13 [phone]

## 2013-04-18 NOTE — ED Notes (Signed)
Phlebotomy is at bedside.  

## 2013-04-18 NOTE — ED Notes (Signed)
Called RN back, she is unavailable at this time.

## 2013-04-18 NOTE — Plan of Care (Signed)
Dr Jeral Fruit called to let him know that pt has arrived to the unit. Waiting for MD to call back. Message left at office.

## 2013-04-18 NOTE — Progress Notes (Signed)
Patient admitted after a fall. Traumatic sah. Cystic lesion in the post op area. i did speak with her husband and son tonite. Will repeat ct in 48 hours.

## 2013-04-18 NOTE — ED Provider Notes (Addendum)
Medical screening examination/treatment/procedure(s) were conducted as a shared visit with non-physician practitioner(s) and myself.  I personally evaluated the patient during the encounter.  EKG Interpretation    Date/Time:  Monday April 18 2013 12:34:30 EST Ventricular Rate:  70 PR Interval:  122 QRS Duration: 132 QT Interval:  444 QTC Calculation: 479 R Axis:   -34 Text Interpretation:  Sinus rhythm Right bundle branch block Baseline wander in lead(s) II III aVF No significant change since last tracing            CRITICAL CARE Performed by: Lear Ng Total critical care time: 30 min Critical care time was exclusive of separately billable procedures and treating other patients. Critical care was necessary to treat or prevent imminent or life-threatening deterioration. Critical care was time spent personally by me on the following activities: development of treatment plan with patient and/or surrogate as well as nursing, discussions with consultants, evaluation of patient's response to treatment, examination of patient, obtaining history from patient or surrogate, ordering and performing treatments and interventions, ordering and review of laboratory studies, ordering and review of radiographic studies, pulse oximetry and re-evaluation of patient's condition.    Pt with recently diagnosed aneurysm on right, known cyst following treatment for astrocytoma by radiation 2 years ago.  Pt has had steady decline in mentation, strength gradually for about 1-2 weeks, but noticeably worse in the past 4-5 days, along with worsening HA.  Pt now with intermittent confusion, memory problems and unusual behavior like staying unclothed at home.  Head CT shows no SAH, not necessarily associated with location of aneurysm, could be related to cyst per D.r Constance Goltz. Will need admission and to be seen by Dr. Jeral Fruit.     Gavin Pound. Tydarius Yawn, MD 04/18/13 1329    1:32 PM Discussed with D.r  Jeral Fruit.  He reviewed CT, doesn't think it is from aneurysm and may not need immediate NSU intervention.  He will see pt by this evening, recommends admission by medicine and can repeat CT in 24 hours to mark progression as well as clinical status to determine further.    Gavin Pound. Fareedah Mahler, MD 04/18/13 1333   2:20 PM Hospitalist doesn't feel he should be admitting pt with SAH.  Dr. Jeral Fruit contacted, he has agreed to admit to his service, is ok with temp orders and pt can eat for now.    Gavin Pound. Tatem Holsonback, MD 04/18/13 1421

## 2013-04-18 NOTE — ED Notes (Signed)
Spoke with Child psychotherapist who will be coming down to speak with the patient.
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
## 2013-04-18 NOTE — ED Notes (Signed)
Report given to French Ana, RN unit 4N.
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
## 2013-04-19 DIAGNOSIS — E43 Unspecified severe protein-calorie malnutrition: Secondary | ICD-10-CM | POA: Insufficient documentation

## 2013-04-19 LAB — URINALYSIS, ROUTINE W REFLEX MICROSCOPIC
Glucose, UA: NEGATIVE mg/dL
Ketones, ur: 15 mg/dL — AB
Nitrite: NEGATIVE
Specific Gravity, Urine: 1.019 (ref 1.005–1.030)
Urobilinogen, UA: 1 mg/dL (ref 0.0–1.0)
pH: 5.5 (ref 5.0–8.0)

## 2013-04-19 LAB — URINE MICROSCOPIC-ADD ON

## 2013-04-19 LAB — PHENYTOIN LEVEL, TOTAL: Phenytoin Lvl: 4.6 ug/mL — ABNORMAL LOW (ref 10.0–20.0)

## 2013-04-19 MED ORDER — ENSURE COMPLETE PO LIQD
237.0000 mL | Freq: Every day | ORAL | Status: DC
  Administered 2013-04-19 – 2013-04-25 (×5): 237 mL via ORAL

## 2013-04-19 MED ORDER — DIAZEPAM 2 MG PO TABS
2.0000 mg | ORAL_TABLET | Freq: Once | ORAL | Status: AC
  Administered 2013-04-19: 2 mg via ORAL
  Filled 2013-04-19: qty 1

## 2013-04-19 MED ORDER — LORAZEPAM 2 MG/ML IJ SOLN
1.0000 mg | Freq: Four times a day (QID) | INTRAMUSCULAR | Status: DC | PRN
  Administered 2013-04-20 – 2013-04-29 (×6): 1 mg via INTRAVENOUS
  Filled 2013-04-19 (×6): qty 1

## 2013-04-19 MED ORDER — LORAZEPAM 2 MG/ML IJ SOLN
1.0000 mg | Freq: Once | INTRAMUSCULAR | Status: AC
  Administered 2013-04-19: 1 mg via INTRAVENOUS
  Filled 2013-04-19: qty 1

## 2013-04-19 NOTE — Progress Notes (Signed)
UR completed.  Patient changed to IP status r/t SAH and patient is requiring IV decadron.

## 2013-04-19 NOTE — Progress Notes (Signed)
Patient continues to have facial twitching after Valium and Keppra given. Spoke with Dr. Jeral Fruit and ordered Ativan 1mg  IV now then every 6hrs as needed. Order carried out.

## 2013-04-19 NOTE — Evaluation (Signed)
Occupational Therapy Evaluation Patient Details Name: [name] MRN: [id number] DOB: 04/11/1961 Today's Date: 04/19/2013 Time: 9562-1308 OT Time Calculation (min): 22 min  OT Assessment / Plan / Recommendation History of present illness HPI: patient taken to the emergency room yesterday because incoordination, falls up to the point that according to her husband she is getting more sleepy. 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 Ct head in the er shows sah most likely traumatic with some fluisd in the cyst.   Clinical Impression   Pt presents with below problem list. Pt independent with ADLs, PTA. Pt will benefit from acute OT to increase independence prior to d/c. Recommending CIR for additional rehab prior to d/c home.     OT Assessment  Patient needs continued OT Services    Follow Up Recommendations  CIR;Supervision/Assistance - 24 hour    Barriers to Discharge      Equipment Recommendations  Other (comment) (defer to next venue)    Recommendations for Other Services Rehab consult  Frequency  Min 2X/week    Precautions / Restrictions Precautions Precautions: Fall Restrictions Weight Bearing Restrictions: No   Pertinent Vitals/Pain No pain reported.     ADL  Grooming: Brushing hair;Wash/dry face;Set up;Supervision/safety;Other (comment) (with cues to look to left ) Where Assessed - Grooming: Supine, head of bed up Upper Body Dressing: Maximal assistance Where Assessed - Upper Body Dressing: Supported sitting Lower Body Dressing: Maximal assistance Where Assessed - Lower Body Dressing: Lean right and/or left Toilet Transfer: Maximal assistance Toilet Transfer Method: Sit to stand Toilet Transfer Equipment: Other (comment) (from bed ) Tub/Shower Transfer Method: Not assessed Equipment Used: Gait belt Transfers/Ambulation Related to ADLs: Max A for sit <> stand transfer. ADL Comments: Pt not attending to left side, but with cues could look to  left. Educated husband to sit on left side. OT presented things on left side and gave cues to look that way during session (washcloth, comb). Pt sat EOB and donned/doffed socks with assistance for balance and assistance with socks. Educated spouse to have left arm positioned on pillow when sitting in chair or when in bed.     OT Diagnosis: Hemiplegia non-dominant side;Altered mental status  OT Problem List: Decreased strength;Decreased cognition;Impaired balance (sitting and/or standing);Decreased activity tolerance;Decreased knowledge of use of DME or AE;Decreased knowledge of precautions;Impaired UE functional use OT Treatment Interventions: Self-care/ADL training;Therapeutic exercise;Neuromuscular education;DME and/or AE instruction;Therapeutic activities;Visual/perceptual remediation/compensation;Patient/family education;Balance training;Cognitive remediation/compensation   OT Goals(Current goals can be found in the care plan section) Acute Rehab OT Goals Patient Stated Goal: not stated OT Goal Formulation: With patient/family Time For Goal Achievement: 04/26/13 Potential to Achieve Goals: Good ADL Goals Pt Will Perform Grooming: with min guard assist;sitting Pt Will Perform Upper Body Bathing: with min guard assist;sitting Pt Will Perform Upper Body Dressing: with min guard assist;sitting Pt Will Transfer to Toilet: with min assist;bedside commode;stand pivot transfer Pt Will Perform Toileting - Clothing Manipulation and hygiene: sit to/from stand;sitting/lateral leans;with min assist Additional ADL Goal #1: Pt will sit EOB x10 minutes and participate in functional task with Min A for balance.   Visit Information  Last OT Received On: 04/19/13 Assistance Needed: +2 History of Present Illness: HPI: patient taken to the emergency room yesterday because incoordination, falls up to the point that according to her husband she is getting more sleepy. A ct-angio done lat week showed a dista  carotid aneurysm. Ct head in the er shows sah most likely traumatic with some fluisd in the  cyst.       Prior Functioning     Home Living Family/patient expects to be discharged to:: Private residence Living Arrangements: Spouse/significant other Type of Home: House Home Access: Ramped entrance Home Layout: One level Home Equipment: Environmental consultant - 2 wheels;Cane - single point;Bedside commode;Wheelchair - manual Prior Function Level of Independence: Independent with assistive device(s) Communication Communication: Other (comment) (slurred speech) Dominant Hand: Right         Vision/Perception     Cognition  Cognition Arousal/Alertness: Awake/alert Behavior During Therapy: Flat affect Overall Cognitive Status: Impaired/Different from baseline Area of Impairment: Following commands;Orientation Orientation Level: Disoriented to;Place Following Commands: Follows one step commands inconsistently Problem Solving: Slow processing;Difficulty sequencing;Requires verbal cues;Requires tactile cues    Extremity/Trunk Assessment Upper Extremity Assessment Upper Extremity Assessment: LUE deficits/detail LUE Deficits / Details: pt not attending to left side and would not raise arm or squeeze hand when asked. Lower Extremity Assessment Lower Extremity Assessment: Defer to PT evaluation     Mobility Bed Mobility Bed Mobility: Supine to Sit;Sit to Supine;Scooting to HOB Supine to Sit: 4: Min assist Sitting - Scoot to Edge of Bed: 4: Min assist Sit to Supine: 3: Mod assist Scooting to HOB: 3: Mod assist;Other (comment) ([place] position used) Details for Bed Mobility Assistance: assisted in placing pt's hand on hand rail to assist with bed mobility. Cues and assistance for technique. Assisted in getting LE onto bed and helping better position self in bed.  Transfers Transfers: Sit to Stand;Stand to Sit Sit to Stand: 2: Max assist;From bed Stand to Sit: 2: Max assist;To bed Details  for Transfer Assistance: educated on hand placement.      Exercise     Balance Balance Balance Assessed: Yes Static Sitting Balance Static Sitting - Balance Support: Feet supported Static Sitting - Level of Assistance: 4: Min assist;3: Mod assist Static Sitting - Comment/# of Minutes: Pt sitting EOB-Min/Mod A for balance. Pushing to left side. Dynamic Sitting Balance Dynamic Sitting - Balance Support: Feet supported Dynamic Sitting - Level of Assistance: 3: Mod assist;4: Min assist Dynamic Sitting - Comments: Min/Mod A for balance as pt was donning socks and leaning back.    End of Session OT - End of Session Equipment Utilized During Treatment: Gait belt Activity Tolerance: Patient tolerated treatment well Patient left: in bed;with call bell/phone within reach;with bed alarm set;with family/visitor present  GO     Earlie Raveling OTR/L [phone] 04/19/2013, 5:53 PM

## 2013-04-19 NOTE — Progress Notes (Signed)
Patient noted to have new onset of facial twitching which is affecting her speech. Dr. Gerlene Fee on call notified and ordered one time dose of Valium 2 mg and to give bedtime Keppra dose now. Order carried out.

## 2013-04-19 NOTE — Progress Notes (Signed)
Rehab Admissions Coordinator Note:  Patient was screened by Trish Mage for appropriateness for an Inpatient Acute Rehab Consult.  Noted PT recommending CIR.  At this time, we are recommending Inpatient Rehab consult.  Trish Mage 04/19/2013, 3:05 PM  I can be reached at [phone].

## 2013-04-19 NOTE — H&P (Signed)
[name] is an 52 y.o. female.   Chief Complaint: headache HPI: patient taken to the emergency room yesterday because incoordination, falls up to the point that according to her husband she is getting more sleepy. 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 Ct head in the er shows sah most likely traumatic with some fluisd in the cyst.  Past Medical History  Diagnosis Date  . 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 Fracture, ankle 2012    right  . History of radiation therapy 09/23/10- 11/07/10    right parietal lobe  . Chronic headaches     uses oxycodone appropriately    Past Surgical History  Procedure Laterality Date  . Orif ankle fracture  08/22/2010    right    Family History  Problem Relation Age of Onset  . Breast cancer Mother    Social History:  reports that she has never smoked. She has never used smokeless tobacco. She reports that she drinks alcohol. She reports that she does not use illicit drugs.  Allergies:  Allergies  Allergen Reactions  . Vasotec Other (See Comments)    Causes angioedema    Medications Prior to Admission  Medication Sig Dispense Refill  . clonazePAM (KLONOPIN) 0.5 MG tablet Take 1 tablet (0.5 mg total) by mouth 2 (two) times daily as needed for anxiety. For anxiety  30 tablet  0  . diazepam (VALIUM) 5 MG tablet Take 1-2 tabs PO, 30 minutes prior to MRI  2 tablet  0  . FLUoxetine (PROZAC) 20 MG tablet Take 30 mg by mouth 2 (two) times daily.        Marland Kitchen levETIRAcetam (KEPPRA) 500 MG tablet Take 1 tablet (500 mg total) by mouth every 12 (twelve) hours.  60 tablet  5  . Multiple Vitamin (MULTIVITAMIN) tablet Take 1 tablet by mouth daily.        Marland Kitchen oxyCODONE (OXY IR/ROXICODONE) 5 MG immediate release tablet Take 1 tablet (5 mg total) by mouth every 8 (eight) hours as needed for pain. 1-2 tabs po q 4 hr prn pain  30 tablet  0  . phenytoin (DILANTIN) 100 MG ER capsule Take 2 capsules (200 mg total) by mouth 2  (two) times daily.  120 capsule  3  . prochlorperazine (COMPAZINE) 10 MG tablet Take 10 mg by mouth every 6 (six) hours as needed. For nausea      . traMADol (ULTRAM) 50 MG tablet Take 1 tablet (50 mg total) by mouth every 6 (six) hours as needed. Maximum dose= 8 tablets per day  60 tablet  3  . zolpidem (AMBIEN) 10 MG tablet Take 10 mg by mouth at bedtime as needed for sleep.        Results for orders placed during the hospital encounter of 04/18/13 (from the past 48 hour(s))  CBC WITH DIFFERENTIAL     Status: Abnormal   Collection Time    04/18/13 11:53 AM      Result Value Range   WBC 5.0  4.0 - 10.5 K/uL   RBC 3.93  3.87 - 5.11 MIL/uL   Hemoglobin 12.3  12.0 - 15.0 g/dL   HCT 95.6 (*) 21.3 - 08.6 %   MCV 88.3  78.0 - 100.0 fL   MCH 31.3  26.0 - 34.0 pg   MCHC 35.4  30.0 - 36.0 g/dL   RDW 57.8  46.9 - 62.9 %   Platelets 121 (*) 150 -  400 K/uL   Neutrophils Relative % 62  43 - 77 %   Neutro Abs 3.1  1.7 - 7.7 K/uL   Lymphocytes Relative 21  12 - 46 %   Lymphs Abs 1.1  0.7 - 4.0 K/uL   Monocytes Relative 15 (*) 3 - 12 %   Monocytes Absolute 0.8  0.1 - 1.0 K/uL   Eosinophils Relative 1  0 - 5 %   Eosinophils Absolute 0.0  0.0 - 0.7 K/uL   Basophils Relative 0  0 - 1 %   Basophils Absolute 0.0  0.0 - 0.1 K/uL  COMPREHENSIVE METABOLIC PANEL     Status: Abnormal   Collection Time    04/18/13 11:53 AM      Result Value Range   Sodium 138  135 - 145 mEq/L   Potassium 3.9  3.5 - 5.1 mEq/L   Chloride 98  96 - 112 mEq/L   CO2 27  19 - 32 mEq/L   Glucose, Bld 109 (*) 70 - 99 mg/dL   BUN 25 (*) 6 - 23 mg/dL   Creatinine, Ser 1.47 (*) 0.50 - 1.10 mg/dL   Calcium 82.9 (*) 8.4 - 10.5 mg/dL   Total Protein 8.0  6.0 - 8.3 g/dL   Albumin 3.5  3.5 - 5.2 g/dL   AST 19  0 - 37 U/L   ALT 9  0 - 35 U/L   Alkaline Phosphatase 91  39 - 117 U/L   Total Bilirubin 1.3 (*) 0.3 - 1.2 mg/dL   GFR calc non Af Amer 44 (*) >90 mL/min   GFR calc Af Amer 51 (*) >90 mL/min   Comment: (NOTE)     The  eGFR has been calculated using the CKD EPI equation.     This calculation has not been validated in all clinical situations.     eGFR's persistently <90 mL/min signify possible Chronic Kidney     Disease.  APTT     Status: None   Collection Time    04/18/13 11:53 AM      Result Value Range   aPTT 28  24 - 37 seconds  PROTIME-INR     Status: None   Collection Time    04/18/13 11:53 AM      Result Value Range   Prothrombin Time 13.9  11.6 - 15.2 seconds   INR 1.09  0.00 - 1.49  URINALYSIS, ROUTINE W REFLEX MICROSCOPIC     Status: Abnormal   Collection Time    04/19/13  6:39 AM      Result Value Range   Color, Urine AMBER (*) YELLOW   Comment: BIOCHEMICALS MAY BE AFFECTED BY COLOR   APPearance CLOUDY (*) CLEAR   Specific Gravity, Urine 1.019  1.005 - 1.030   pH 5.5  5.0 - 8.0   Glucose, UA NEGATIVE  NEGATIVE mg/dL   Hgb urine dipstick LARGE (*) NEGATIVE   Bilirubin Urine SMALL (*) NEGATIVE   Ketones, ur 15 (*) NEGATIVE mg/dL   Protein, ur 30 (*) NEGATIVE mg/dL   Urobilinogen, UA 1.0  0.0 - 1.0 mg/dL   Nitrite NEGATIVE  NEGATIVE   Leukocytes, UA MODERATE (*) NEGATIVE  URINE MICROSCOPIC-ADD ON     Status: Abnormal   Collection Time    04/19/13  6:39 AM      Result Value Range   Squamous Epithelial / LPF FEW (*) RARE   WBC, UA 11-20  <3 WBC/hpf   RBC / HPF 7-10  <3 RBC/hpf  Bacteria, UA MANY (*) RARE   Ct Head Wo Contrast  04/18/2013   CLINICAL DATA:  History of astrocytoma post resection. Post radiation therapy.  EXAM: CT HEAD WITHOUT CONTRAST  TECHNIQUE: Contiguous axial images were obtained from the base of the skull through the vertex without intravenous contrast.  COMPARISON:  04/11/2013 CT angiogram. 03/04/2013 and 08/23/2012 MR brain.  FINDINGS: New from the recent CT angiogram is the presence of a moderate amount of subarachnoid hemorrhage along the right frontal -temporal-parietal region and within the right sylvian fissure. Small amount of dependent blood is also seen  within the lateral ventricles.  Although one could invoke subarachnoid blood arising from the recently demonstrated right internal carotid artery bilobed aneurysm, the blood appear separate from this region and therefore other causes of subarachnoid blood may need to be considered.  This includes subarachnoid blood related to post radiation therapy vasculitis, right middle cerebral artery branch vessel the aneurysm or underlying vascular malformation.  Large cystic structure within the right parietal region has been questioned as possibly representing post radiation therapy cystic changes as versus recurrent tumor. Within the deep dependent aspect of this cystic structure, hyperdense material is noted. This has been noted previously although appears more prominent. It is conceivable that hemorrhage into this cystic structure has occurred and broken through into the right subdural space and right subarachnoid space. Complex right subdural collection suggestive of hemorrhage/postsurgical changes more notable than on prior exams.  There is mass effect upon the right lateral ventricle with minimal midline shift to the left. The ventricles appear slightly prominent and early hydrocephalus may be present.  IMPRESSION: Since the recent CT angiogram, interval development of moderate amount of subarachnoid blood right temporal -frontal -parietal region and in the right sylvian fissure. Additionally, increase in hyperdense material within the right parietal cystic structure and increase in density of complex right-sided subdural collection.  The subarachnoid blood is not centered at the level of the right internal carotid artery bilobed aneurysm (recently demonstrated on CT angiogram).  It may be that there has been hemorrhage into the right parietal lobe cystic structure which has broken through into the right-sided subdural space and right-sided subarachnoid space and contributes to the small amount of dependent  intraventricular blood.  Underlying branch vessel aneurysm, vasculitis or vascular malformation is not excluded.  The ventricles appear slightly more prominent than on the prior exam and early hydrocephalus may be present and there is very minimal midline shift to the left.  These results were called by telephone at the time of interpretation on 04/18/2013 at 12:26 PM to Dr. Mellody Drown , who verbally acknowledged these results. Request for call to Dr. Jeral Fruit.   Electronically Signed   By: Bridgett Larsson M.D.   On: 04/18/2013 12:50    Review of Systems  Constitutional: Negative.   HENT: Negative.   Respiratory: Negative.   Cardiovascular: Negative.   Gastrointestinal: Negative.   Genitourinary: Negative.   Musculoskeletal: Positive for myalgias.  Skin: Negative.   Neurological: Positive for dizziness, speech change, focal weakness and seizures.  Psychiatric/Behavioral: Positive for depression and memory loss.    Blood pressure 143/85, pulse 77, temperature 97.8 F (36.6 C), temperature source Oral, resp. rate 18, height 5\' 4"  (1.626 m), weight 61.961 kg (136 lb 9.6 oz), last menstrual period 08/26/2010, SpO2 98.00%. Physical Exam hent, scar from previous surgery. No evidence of trauma. Neck, nl. Cv, nl. Lugs some rales. Abdomen soft. Extremities mild edema. Some skin echymosis. Neuro , sleepy, moves all  a extremities but with poor coordination. Ct seen and i agree with the findings. Showed th ct to dr Conchita Paris and he agrees about leaving the aneurysm alone. We are going to run some tests and then decice about the drainage of the cyst with leaving an [place] reservoir.   Assessment/Plan See above. Spoke with husband and son  [name] 04/19/2013, 10:24 AM

## 2013-04-19 NOTE — Evaluation (Signed)
Physical Therapy Evaluation Patient Details Name: [name] MRN: [id number] DOB: 03/12/1961 Today's Date: 04/19/2013 Time: 4098-1191 PT Time Calculation (min): 29 min  PT Assessment / Plan / Recommendation History of Present Illness  HPI: patient taken to the emergency room yesterday because incoordination, falls up to the point that according to her husband she is getting more sleepy. 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 Ct head in the er shows sah most likely traumatic with some fluisd in the cyst.  Clinical Impression  Patient demonstrates deficits in functional mobility as indicated below. Patient will benefit from continued skilled PT to address deficits and maximize function. Will continue to see as indicated and progress activity as tolerated. Given level of deficits, recommend CIR upon discharge.  Patient spouse works during the day and patient will need to be able to care for herself during these hours. Spouse states cognition decline with onset of symptoms (1 wk). Patient did use RW and was able to transfer to bedside commode prior to this past wk without assist.    PT Assessment  Patient needs continued PT services    Follow Up Recommendations  CIR             Recommendations for Other Services Rehab consult;OT consult;Speech consult   Frequency Min 3X/week    Precautions / Restrictions Precautions Precautions: Fall Restrictions Weight Bearing Restrictions: No   Pertinent Vitals/Pain No pain at this time      Mobility  Bed Mobility Bed Mobility: Supine to Sit;Sitting - Scoot to Edge of Bed;Sit to Supine Supine to Sit: 3: Mod assist Sitting - Scoot to Edge of Bed: 3: Mod assist Sit to Supine: 3: Mod assist Details for Bed Mobility Assistance: Max cues with patient unable to sequence how to get to EOB  Transfers Transfers: Sit to Stand;Stand to Sit;Stand Pivot Transfers Sit to Stand: 2: Max assist Stand to Sit: 2: Max assist Stand Pivot  Transfers: 1: +1 Total assist Details for Transfer Assistance: Pt with significant pushers during attempt to transfers.  Ambulation/Gait Ambulation/Gait Assistance: Not tested (comment)    Exercises     PT Diagnosis: Difficulty walking;Generalized weakness;Hemiplegia non-dominant side;Altered mental status  PT Problem List: Decreased strength;Decreased range of motion;Decreased activity tolerance;Decreased balance;Decreased mobility;Decreased knowledge of use of DME PT Treatment Interventions: DME instruction;Gait training;Functional mobility training;Therapeutic activities;Therapeutic exercise;Patient/family education     PT Goals(Current goals can be found in the care plan section) Acute Rehab PT Goals Patient Stated Goal: to go home PT Goal Formulation: With patient Time For Goal Achievement: 05/03/13 Potential to Achieve Goals: Fair  Visit Information  Last PT Received On: 04/19/13 Assistance Needed: +2 History of Present Illness: HPI: patient taken to the emergency room yesterday because incoordination, falls up to the point that according to her husband she is getting more sleepy. 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 Ct head in the er shows sah most likely traumatic with some fluisd in the cyst.       Prior Functioning  Home Living Family/patient expects to be discharged to:: Private residence Living Arrangements: Spouse/significant other Type of Home: House Home Access: Ramped entrance Home Layout: One level Home Equipment: Environmental consultant - 2 wheels;Cane - single point;Bedside commode;Wheelchair - manual Prior Function Level of Independence: Independent with assistive device(s) Communication Communication:  (slurred speech) Dominant Hand: Right    Cognition  Cognition Arousal/Alertness: Awake/alert Behavior During Therapy: Flat affect Overall Cognitive Status: Impaired/Different from baseline Area of Impairment: Following  commands;Safety/judgement;Awareness;Problem solving Following Commands: Follows one step commands inconsistently Safety/Judgement: Decreased awareness of safety;Decreased awareness of deficits Awareness: Intellectual;Emergent Problem Solving: Slow processing;Decreased initiation;Difficulty sequencing;Requires verbal cues;Requires tactile cues General Comments: Patient unable to sequence how to perform hygiene and wipe following toileting, patient oriented but demonstrates higher level confusion throughout session. Patient with intermitten lability during session as well.    Extremity/Trunk Assessment Upper Extremity Assessment Upper Extremity Assessment: Defer to OT evaluation (Pt can not feel her left arm, didnt know if it was my arm ) Lower Extremity Assessment Lower Extremity Assessment: LLE deficits/detail LLE Deficits / Details: some weakness evident LLE 3 to 3+/5 grossly through MMTs LLE Sensation: decreased light touch LLE Coordination: decreased fine motor;decreased gross motor   Balance Balance Balance Assessed: Yes Static Sitting Balance Static Sitting - Balance Support: Feet supported Static Sitting - Level of Assistance: 4: Min assist Static Sitting - Comment/# of Minutes: 6 minutes EOB; 6 mins BSC  End of Session PT - End of Session Equipment Utilized During Treatment: Gait belt Activity Tolerance: Patient tolerated treatment well;Patient limited by fatigue Patient left: in bed;with call bell/phone within reach;with bed alarm set;with family/visitor present Nurse Communication: Mobility status  GP     Fabio Asa 04/19/2013, 2:05 PM Charlotte Crumb, PT DPT  [phone]

## 2013-04-19 NOTE — Progress Notes (Signed)
INITIAL NUTRITION ASSESSMENT  DOCUMENTATION CODES Per approved criteria  -Severe malnutrition in the context of acute illness or injury   INTERVENTION:  Ensure Complete daily (350 kcals, 13 gm protein per 8 fl oz bottle) RD to follow for nutrition care plan  NUTRITION DIAGNOSIS: Inadequate oral intake related to decreased appetite as evidenced by patient report  Goal: Pt to meet >/= 90% of their estimated nutrition needs   Monitor:  PO & supplemental intake, weight, labs, I/O's  Reason for Assessment: Malnutrition Screening Tool Report  52 y.o. female  Admitting Dx: headache  ASSESSMENT: Patient with PMH of chronic headaches presented to ER because of incoordination; CT-angio done last week which showed a dista carotid aneurysm; CT head in ER showed SAH most likely traumatic with some fluid in the cyst.  Patient reports a poor appetite PTA -- was only consuming 1 meal per day x 1 week -- was eating "easy to fix foods like soups;" patient also stated she's lost weight from her usual body weight in this time frame (3%) -- severe for time frame; no % PO intake per flowsheet records; would like Ensure supplement daily after supper -- RD to order.  Patient meets criteria for severe malnutrition in the context of acute illness or injury as evidenced by < 50% intake of energy requirement for > 5 days and reported 3% weight loss x 1 week.  Height: Ht Readings from Last 1 Encounters:  04/18/13 5\' 4"  (1.626 m)    Weight: Wt Readings from Last 1 Encounters:  04/18/13 136 lb 9.6 oz (61.961 kg)    Ideal Body Weight: 120 lb  % Ideal Body Weight: 113%  Wt Readings from Last 10 Encounters:  04/18/13 136 lb 9.6 oz (61.961 kg)  03/07/13 141 lb 9.6 oz (64.229 kg)  12/13/12 147 lb 11.2 oz (66.996 kg)  08/31/12 151 lb 6.4 oz (68.675 kg)  07/07/12 153 lb 4.8 oz (69.536 kg)  05/04/12 153 lb 11.2 oz (69.718 kg)  02/17/12 150 lb 12.8 oz (68.402 kg)  12/02/11 151 lb 8 oz (68.72 kg)   10/22/11 151 lb 4.8 oz (68.629 kg)  09/22/11 154 lb 1.6 oz (69.899 kg)    Usual Body Weight: 141 lb  % Usual Body Weight: 96%  BMI:  Body mass index is 23.44 kg/(m^2).  Estimated Nutritional Needs: Kcal: 1600-1800 Protein: 75-85 gm Fluid: 1.6-1.8 L  Skin: Intact  Diet Order: General  EDUCATION NEEDS: -No education needs identified at this time    Labs:   Recent Labs Lab 04/18/13 1153  NA 138  K 3.9  CL 98  CO2 27  BUN 25*  CREATININE 1.34*  CALCIUM 10.6*  GLUCOSE 109*    Scheduled Meds: . dexamethasone  4 mg Intravenous Q6H  . FLUoxetine  30 mg Oral BID  . levETIRAcetam  500 mg Oral Q12H  . multivitamin with minerals  1 tablet Oral Daily  . pantoprazole  40 mg Oral Daily  . phenytoin  200 mg Oral BID    Continuous Infusions:   Past Medical History  Diagnosis Date  . 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 Fracture, ankle 2012    right  . History of radiation therapy 09/23/10- 11/07/10    right parietal lobe  . Chronic headaches     uses oxycodone appropriately    Past Surgical History  Procedure Laterality Date  . Orif ankle fracture  08/22/2010    right    Maureen Chatters, RD,  LDN Pager #: [phone] After-Hours Pager #: [phone]

## 2013-04-19 NOTE — Progress Notes (Signed)
More awake , moves all 4 extremities. Told me she fell several times. Lab reports so far normal. Get keppra and dilantin level

## 2013-04-20 ENCOUNTER — Inpatient Hospital Stay (HOSPITAL_COMMUNITY): Payer: PRIVATE HEALTH INSURANCE

## 2013-04-20 DIAGNOSIS — I609 Nontraumatic subarachnoid hemorrhage, unspecified: Principal | ICD-10-CM

## 2013-04-20 DIAGNOSIS — R569 Unspecified convulsions: Secondary | ICD-10-CM

## 2013-04-20 DIAGNOSIS — S069X9A Unspecified intracranial injury with loss of consciousness of unspecified duration, initial encounter: Secondary | ICD-10-CM

## 2013-04-20 DIAGNOSIS — W19XXXA Unspecified fall, initial encounter: Secondary | ICD-10-CM

## 2013-04-20 LAB — URINE CULTURE

## 2013-04-20 LAB — MRSA PCR SCREENING: MRSA by PCR: NEGATIVE

## 2013-04-20 MED ORDER — SODIUM CHLORIDE 0.9 % IV SOLN
750.0000 mg | Freq: Once | INTRAVENOUS | Status: AC
  Administered 2013-04-20: 750 mg via INTRAVENOUS
  Filled 2013-04-20: qty 7.5

## 2013-04-20 MED ORDER — SODIUM CHLORIDE 0.9 % IV SOLN
100.0000 mg | Freq: Once | INTRAVENOUS | Status: AC
  Administered 2013-04-20: 100 mg via INTRAVENOUS
  Filled 2013-04-20: qty 10

## 2013-04-20 MED ORDER — SODIUM CHLORIDE 0.9 % IV SOLN
INTRAVENOUS | Status: DC
  Administered 2013-04-20 – 2013-04-22 (×2): via INTRAVENOUS
  Administered 2013-04-22: 1000 mL via INTRAVENOUS
  Administered 2013-04-23: 18:00:00 via INTRAVENOUS
  Administered 2013-04-23 – 2013-04-24 (×2): 1000 mL via INTRAVENOUS
  Administered 2013-04-24 (×2): via INTRAVENOUS
  Administered 2013-04-24: 1000 mL via INTRAVENOUS
  Administered 2013-04-25 – 2013-04-26 (×3): via INTRAVENOUS

## 2013-04-20 MED ORDER — GADOBENATE DIMEGLUMINE 529 MG/ML IV SOLN
15.0000 mL | Freq: Once | INTRAVENOUS | Status: AC | PRN
  Administered 2013-04-20: 13 mL via INTRAVENOUS

## 2013-04-20 MED ORDER — LEVETIRACETAM 500 MG PO TABS
1000.0000 mg | ORAL_TABLET | Freq: Two times a day (BID) | ORAL | Status: DC
  Administered 2013-04-20 – 2013-05-05 (×29): 1000 mg via ORAL
  Filled 2013-04-20 (×31): qty 2

## 2013-04-20 NOTE — Progress Notes (Signed)
Transfer report given to Nurse Meghan RN in unit 3100.

## 2013-04-20 NOTE — Progress Notes (Signed)
MD suggested patient be moved to the ICU. No acute symptoms as at now.

## 2013-04-20 NOTE — Progress Notes (Signed)
Patient ID: [name], female   DOB: [birth year], 52 y.o.   MRN: [id number] Transfer to the icu because of persistent seizures. Mri shows a right cva , small subdural  hematoma and the possibility of the.  sah source could be a rupture of the aneurysm with the present vasospasms. noe , sleepy but wakes up to voice . No more seizures but weakness in left specially in face. Seen by neurology. i did speak with dr Conchita Paris ,who will see her in am. Next goal will be to go ahead with a cerebral angiogram to r/o the aneurysm as a source to the sah. Husband made aware by me about the findings

## 2013-04-20 NOTE — Progress Notes (Signed)
Patient is transferred from room 4N07 to unit 3100 at this time. Alert and in stable condition.
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
## 2013-04-20 NOTE — Evaluation (Addendum)
Clinical/Bedside Swallow Evaluation Patient Details  Name: [name] MRN: [id number] Date of Birth: [birth year]  Today's Date: 04/20/2013 Time: 1324-4010 SLP Time Calculation (min): 15 min  Past Medical History:  Past Medical History  Diagnosis Date  . 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 Fracture, ankle 2012    right  . History of radiation therapy 09/23/10- 11/07/10    right parietal lobe  . Chronic headaches     uses oxycodone appropriately   Past Surgical History:  Past Surgical History  Procedure Laterality Date  . Orif ankle fracture  08/22/2010    right   HPI:  52 yr old patient taken to the emergency room yesterday because incoordination, falls up to the point that according to her husband she is getting more sleepy. CT-angio done lat week showed a dista carotid aneurysm. Ct head in the er shows sah most likely traumatic with some fluisd in the cyst.  MRI showed acute right MCA infarct with edema and perhaps some petechial hemorrhage, acute right subdural hematoma measuring 5 mm in thickness and superimposed on a small chronic postoperative extra-axial collection, acute subarachnoid hemorrhage, more so in the right hemisphere, trace intraventricular hemorrhage, suggestion of a small volume of hemorrhage within the chronic right hemisphere treatment site, trace leftward midline shift. No ventriculomegaly.   Assessment / Plan / Recommendation Clinical Impression  Pt.  seen for swallow assessment with husband at bedside who reported one coughing episode after soda at lunch today.  SLP removed left buccal cavity food residual during oral care.  Vocal quality following clinical assisted trials intermittently wet with decreased larynngeal elevation palpated during swallow.  Given clinical observations, severity of hemorrhage/SDH/infarct, severe dysarthria, recommend downgrade diet to Dys 2 with nectar thick liquids and MBS next date.  SLP requests order to  assess speech-language-cognition.    Aspiration Risk  Severe    Diet Recommendation Dysphagia 2 (Fine chop);Nectar-thick liquid   Liquid Administration via: Cup;No straw Medication Administration: Crushed with puree Supervision: Staff to assist with self feeding;Full supervision/cueing for compensatory strategies Compensations: Slow rate;Small sips/bites Postural Changes and/or Swallow Maneuvers: Seated upright 90 degrees;Upright 30-60 min after meal    Other  Recommendations Recommended Consults: MBS Oral Care Recommendations: Oral care Q4 per protocol Other Recommendations: Order thickener from pharmacy   Follow Up Recommendations  Inpatient Rehab    Frequency and Duration min 2x/week  2 weeks   Pertinent Vitals/Pain WDL         Swallow Study           Oral/Motor/Sensory Function Overall Oral Motor/Sensory Function: Impaired Labial ROM: Reduced left Labial Symmetry: Abnormal symmetry left Labial Strength: Reduced Labial Sensation: Reduced Lingual ROM: Reduced left Lingual Strength: Reduced Lingual Sensation: Reduced Facial ROM: Reduced left Facial Symmetry: Left droop Facial Strength: Reduced Facial Sensation: Reduced Velum: Within Functional Limits Mandible: Within Functional Limits   Ice Chips Ice chips: Not tested   Thin Liquid Thin Liquid: Impaired Presentation: Spoon;Cup Pharyngeal  Phase Impairments: Wet Vocal Quality;Decreased hyoid-laryngeal movement    Nectar Thick Nectar Thick Liquid: Not tested   Honey Thick Honey Thick Liquid: Not tested   Puree Puree: Impaired Pharyngeal Phase Impairments: Decreased hyoid-laryngeal movement   Solid   GO    Solid: Not tested       Royce Macadamia M.Ed [employer] [phone]  04/20/2013

## 2013-04-20 NOTE — Progress Notes (Signed)
During RN rounding this am, patient was seen having left arm twitching, but she was aware of it and could answer any questions being asked but speech was slurred. It lasted for about 5 seconds.Patient went back to sleep after that. Will continue to monitor.

## 2013-04-20 NOTE — Consult Note (Addendum)
NEURO HOSPITALIST CONSULT NOTE    Reason for Consult: seizures  HPI:                                                                                                                                          [name] is an 52 y.o. 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 Neurosurgery attending Dr. 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 MRI/MRA brain reviewed in details. She received 2 mg IV ativan around 3 pm today and is also receiving 500 mg po keppra and dilantin 400 mg daily. Patient is not seizing at this precise moment. Seems to be sedated but able to answer questions and follows commands.   Past Medical History  Diagnosis Date  . 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 Fracture, ankle 2012    right  . History of radiation therapy 09/23/10- 11/07/10    right parietal lobe  . Chronic headaches     uses oxycodone appropriately    Past Surgical History  Procedure Laterality Date  . Orif ankle fracture  08/22/2010    right    Family History  Problem Relation Age of Onset  . Breast cancer Mother     Social History:  reports that she has never smoked. She has never used smokeless tobacco. She reports that she drinks alcohol. She reports that she does not use illicit drugs.  Allergies  Allergen Reactions  . Vasotec Other (See Comments)    Causes angioedema    MEDICATIONS:                                                                                                                     I have reviewed the patient's current medications.  ROS: deferred due to patient's sedation  History obtained from chart review.    Physical exam: pleasant female, sedated, in no apparent distress. Blood pressure 113/65, pulse 59, temperature 98.8 F (37.1 C), temperature source Oral, resp. rate 16, height 5\' 4"  (1.626 m), weight 61.961 kg (136 lb 9.6 oz), last menstrual period 08/26/2010, SpO2 96.00%. Head: normocephalic. Neck: supple, no bruits, no JVD. Cardiac: no murmurs. Lungs: clear. Abdomen: soft, no tender, no mass. Extremities: no edema.  Neurologic Examination:                                                                                                      Mental Status: Sedated but oriented to place-year-month. No frank evidence of dysarthria or aphasia.  Able to follow 2nd step commands without difficulty. Cranial Nerves: II: Discs flat bilaterally; Visual fields grossly normal, pupils equal, round, reactive to light and accommodation III,IV, VI: ptosis not present, extra-ocular motions intact bilaterally V,VII: smile asymmetric with left face weakness, facial light touch sensation normal bilaterally VIII: hearing normal bilaterally IX,X: gag reflex present XI: bilateral shoulder shrug no tested XII: midline tongue extension without atrophy or fasciculations  Motor: Significant for left sided weakness. Sensory: Pinprick and light touch intact throughout, bilaterally Deep Tendon Reflexes:  Brisk all over Plantars: No tested. Cerebellar: Can not be tested at this time Gait:  Can not be tested at this time CV: pulses palpable throughout    No results found for this basename: cbc, bmp, coags, chol, tri, ldl, hga1c    Results for orders placed during the hospital encounter of 04/18/13 (from the past 48 hour(s))  URINALYSIS, ROUTINE W REFLEX MICROSCOPIC     Status: Abnormal   Collection Time    04/19/13  6:39 AM      Result Value Range   Color, Urine AMBER (*) YELLOW   Comment:  BIOCHEMICALS MAY BE AFFECTED BY COLOR   APPearance CLOUDY (*) CLEAR   Specific Gravity, Urine 1.019  1.005 - 1.030   pH 5.5  5.0 - 8.0   Glucose, UA NEGATIVE  NEGATIVE mg/dL   Hgb urine dipstick LARGE (*) NEGATIVE   Bilirubin Urine SMALL (*) NEGATIVE   Ketones, ur 15 (*) NEGATIVE mg/dL   Protein, ur 30 (*) NEGATIVE mg/dL   Urobilinogen, UA 1.0  0.0 - 1.0 mg/dL   Nitrite NEGATIVE  NEGATIVE   Leukocytes, UA MODERATE (*) NEGATIVE  URINE MICROSCOPIC-ADD ON     Status: Abnormal   Collection Time    04/19/13  6:39 AM      Result Value Range   Squamous Epithelial / LPF FEW (*) RARE   WBC, UA 11-20  <3 WBC/hpf   RBC / HPF 7-10  <3 RBC/hpf   Bacteria, UA MANY (*) RARE  URINE CULTURE     Status: None   Collection Time    04/19/13  6:39 AM      Result Value Range   Specimen Description URINE, RANDOM     Special Requests NONE     Culture  Setup Time       Value: 04/19/2013 13:58  Performed at [facility] Count       Value: >=100,000 COLONIES/ML     Performed at [facility]   Culture       Value: ESCHERICHIA COLI     Performed at [facility]   Report Status PENDING    PHENYTOIN LEVEL, TOTAL     Status: Abnormal   Collection Time    04/19/13 11:50 AM      Result Value Range   Phenytoin Lvl 4.6 (*) 10.0 - 20.0 ug/mL  LEVETIRACETAM LEVEL     Status: Abnormal   Collection Time    04/19/13 11:50 AM      Result Value Range   Levetiracetam Lvl 38.1 (*) 5.0 - 30.0 ug/mL   Comment: Performed at [facility]  MRSA PCR SCREENING     Status: None   Collection Time    04/20/13  4:02 PM      Result Value Range   MRSA by PCR NEGATIVE  NEGATIVE   Comment:            The GeneXpert MRSA Assay (FDA     approved for NASAL specimens     only), is one component of a     comprehensive MRSA colonization     surveillance program. It is not     intended to diagnose MRSA     infection nor to guide or     monitor treatment for     MRSA infections.     Mr [name] [facility] Contrast  04/20/2013   ADDENDUM REPORT: 04/20/2013 15:07  ADDENDUM: This case further discussed with Carla Renshaw, MD on 04/20/2013 at 15:03. He advises that the patient fell prior to (and perhaps days before) the onset of altered mental status.  That history would seem to favor posttraumatic intracranial hemorrhage which then may have resulted in the right MCA infarct on the basis of vasospasm.  We discussed the various aspects of this case with regard to risks and benefits of any proposed therapy.   Electronically Signed   By: Augusto Gamble M.D.   On: 04/20/2013 15:07   04/20/2013   ADDENDUM REPORT: 04/20/2013 13:37  ADDENDUM: Study discussed by telephone with Dr. Hilda Lias on 04/20/2013 at 13:35 .  The series in which the multiple acute abnormalities in this case developed is unclear based on the imaging  (i.e. 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 We also discussed the pronounced intracranial vasospasm on MRA.   Electronically Signed   By: Augusto Gamble M.D.   On: 04/20/2013 13:37   04/20/2013   CLINICAL DATA:  52 year old female with history of astrocytoma status post surgery and radiation with subsequent slow cystic enlargement at the treatment site. Also chronic distal right ICA aneurysm. Acute altered mental status with acute intracranial hemorrhage. Initial encounter.  EXAM: MRI HEAD WITHOUT AND WITH CONTRAST  MRA HEAD WITHOUT CONTRAST  TECHNIQUE: Multiplanar, multiecho pulse sequences of the brain and surrounding structures were obtained without and with intravenous contrast. Angiographic images of the head were obtained using MRA technique without contrast.  CONTRAST:  13mL MULTIHANCE GADOBENATE DIMEGLUMINE 529 MG/ML IV SOLN  COMPARISON:  Head CT without contrast 04/18/2013. Intracranial CTA 04/11/2013. Brain MRI  03/04/2013 and earlier.  FINDINGS: MRI HEAD FINDINGS  The chronic right cystic lesion at the treatment site  appears stable in size and configuration since 03/04/2013, measuring about 41 mm diameter. This cyst before had a deep tendon layer of dark T2 and T2* signal, which appears stable in volume since that time but now demonstrates increased FLAIR and T1 signal (series 14, image 68).  A small chronic postoperative extra-axial collection was present now with a superimposed new broad-based FLAIR hyperintense 5 mm collection with heterogeneous intrinsic T1 signal, presumed to be hemorrhage.  There is also superimposed right sylvian fissure and hemisphere subarachnoid hemorrhage, as well as new trace intraventricular hemorrhage. Small volume other basilar cisterns subarachnoid hemorrhage including in the left sylvian fissure.  Superimposed on these changes is confluent restricted diffusion in the right (mostly posterior) MCA territory. Confluent involvement of the right parietal lobe, lateral occipital lobe and temporal lobe, and also the insula and operculum to a lesser degree. T2 and FLAIR hyperintensity in the affected cortex most resembles cytotoxic edema.  There is possible petechial hemorrhage associated with the infarct (series 13, images 12-10). Following contrast there is Mild luxury perfusion type enhancement. There is stable mild nodular enhancement at the treatment site (series 17, image 33).  Trace leftward midline shift. No ventriculomegaly. Basilar cisterns remain patent. Major intracranial vascular flow voids are stable. Left hemisphere gray and white matter signal is stable.  Stable orbits soft tissues, paranasal sinuses, mastoids and bone marrow signal. No acute scalp soft tissue finding identified.  MRA HEAD FINDINGS  Antegrade flow in the posterior circulation with codominant distal vertebral arteries and patent vertebrobasilar junction. Normal left PICA origin. Stable AICA origins.  Be on the AICA  origins there is new focal narrowing of the basilar artery (series 1205, image 10), which appears genuine (see also series 16, image 16). The distal basilar remains patent despite this finding. SCA and PCA origins remain patent, however, there is now widespread moderate to severe PCA irregularity, worse on the right. This is a new finding, and results in tandem stenoses throughout the bilateral PCAs.  Preserved antegrade flow in both ICA siphons. 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 No right ICA stenosis identified. The right ICA terminus is patent, but there is moderate to severe irregularity in the right MCA M1 segment and also in both A1 segments. Right MCA branches are obscured by intrinsically high signal subarachnoid hemorrhage in the right sylvian fissure.  Antegrade flow in the left ICA siphon with no left ICA stenosis identified. Left ophthalmic artery origin remains normal. Left carotid terminus remains patent. The left MCA and ACA origins remain patent. Moderate severe ACA branch irregularity bilaterally. Anterior communicating artery remains patent. Mild to moderate left A1 segment branch irregularity with moderate irregularity of the more distal left MCA branches.  IMPRESSION: 1. Acute right MCA infarct with edema and perhaps some petechial hemorrhage.  2. Acute right subdural hematoma measuring 5 mm in thickness and superimposed on a small chronic postoperative extra-axial collection  3. Acute subarachnoid hemorrhage, more so in the right hemisphere. Trace intraventricular hemorrhage. Suggestion of a small volume of hemorrhage within the chronic right hemisphere treatment site.  4. Trace leftward midline shift.  No ventriculomegaly.  5. Abnormal intracranial MRA with new mid basilar stenosis and diffuse medium-sized vessel irregularity and tandem stenoses. Favor Vasa spasm.  6.  Right MCA branches obscured by hyperintense subarachnoid hemorrhage.  7. The right ICA terminus bilobed aneurysm appears stable compared  to 04/11/2013.  Dr. Hilda Lias , who is in the operating room at the time of this report, was contacted and asked to call me regarding this case on 04/20/2013 at 13:06.  Electronically Signed: By: Augusto Gamble M.D. On: 04/20/2013 13:24   Mr Laqueta Jean AV Contrast  04/20/2013   ADDENDUM REPORT: 04/20/2013 15:07  ADDENDUM: This case further discussed with Carla Renshaw, MD on 04/20/2013 at 15:03. He advises that the patient fell prior to (and perhaps days before) the onset of altered mental status.  That history would seem to favor posttraumatic intracranial hemorrhage which then may have resulted in the right MCA infarct on the basis of vasospasm.  We discussed the various aspects of this case with regard to risks and benefits of any proposed therapy.   Electronically Signed   By: Augusto Gamble M.D.   On: 04/20/2013 15:07   04/20/2013   ADDENDUM REPORT: 04/20/2013 13:37  ADDENDUM: Study discussed by telephone with Dr. Hilda Lias on 04/20/2013 at 13:35 .  The series in which the multiple acute abnormalities in this case developed is unclear based on the imaging  (i.e. 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 We also discussed the pronounced intracranial vasospasm on MRA.   Electronically Signed   By: Augusto Gamble M.D.   On: 04/20/2013 13:37   04/20/2013   CLINICAL DATA:  52 year old female with history of astrocytoma status post surgery and radiation with subsequent slow cystic enlargement at the treatment site. Also chronic distal right ICA aneurysm. Acute altered mental status with acute intracranial hemorrhage. Initial encounter.  EXAM: MRI HEAD WITHOUT AND WITH CONTRAST  MRA HEAD WITHOUT CONTRAST  TECHNIQUE:  Multiplanar, multiecho pulse sequences of the brain and surrounding structures were obtained without and with intravenous contrast. Angiographic images of the head were obtained using MRA technique without contrast.  CONTRAST:  13mL MULTIHANCE GADOBENATE DIMEGLUMINE 529 MG/ML IV SOLN  COMPARISON:  Head CT without contrast 04/18/2013. Intracranial CTA 04/11/2013. Brain MRI 03/04/2013 and earlier.  FINDINGS: MRI HEAD FINDINGS  The chronic right cystic lesion at the treatment site appears stable in size and configuration since 03/04/2013, measuring about 41 mm diameter. This cyst before had a deep tendon layer of dark T2 and T2* signal, which appears stable in volume since that time but now demonstrates increased FLAIR and T1 signal (series 14, image 68).  A small chronic postoperative extra-axial collection was present now with a superimposed new broad-based FLAIR hyperintense 5 mm collection with heterogeneous intrinsic T1 signal, presumed to be hemorrhage.  There is also superimposed right sylvian fissure and hemisphere subarachnoid hemorrhage, as well as new trace intraventricular hemorrhage. Small volume other basilar cisterns subarachnoid hemorrhage including in the left sylvian fissure.  Superimposed on these changes is confluent restricted diffusion in the right (mostly posterior) MCA territory. Confluent involvement of the right parietal lobe, lateral occipital lobe and temporal lobe, and also the insula and operculum to a lesser degree. T2 and FLAIR hyperintensity in the affected cortex most resembles cytotoxic edema.  There is possible petechial hemorrhage associated with the infarct (series 13, images 12-10). Following contrast there is Mild luxury perfusion type enhancement. There is stable mild nodular enhancement at the treatment site (series 17, image 33).  Trace leftward midline shift. No ventriculomegaly. Basilar cisterns remain patent. Major intracranial vascular flow voids are stable. Left  hemisphere gray and white matter signal is stable.  Stable orbits soft tissues, paranasal sinuses, mastoids and bone marrow signal. No acute scalp soft tissue finding identified.  MRA HEAD FINDINGS  Antegrade flow in the posterior circulation with codominant distal vertebral arteries and patent vertebrobasilar junction. Normal left PICA origin. Stable AICA origins.  Be on the AICA origins there is new focal narrowing of the basilar artery (series 1205, image 10), which appears genuine (see also series 16, image 16). The distal basilar remains patent despite this finding. SCA and PCA origins remain patent, however, there is now widespread moderate to severe PCA irregularity, worse on the right. This is a new finding, and results in tandem stenoses throughout the bilateral PCAs.  Preserved antegrade flow in both ICA siphons. 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 No right ICA stenosis identified. The right ICA terminus is patent, but there is moderate to severe irregularity in the right MCA M1 segment and also in both A1 segments. Right MCA branches are obscured by intrinsically high signal subarachnoid hemorrhage in the right sylvian fissure.  Antegrade flow in the left ICA siphon with no left ICA stenosis identified. Left ophthalmic artery origin remains normal. Left carotid terminus remains patent. The left MCA and ACA origins remain patent. Moderate severe ACA branch irregularity bilaterally. Anterior communicating artery remains patent. Mild to moderate left A1 segment branch irregularity with moderate irregularity of the more distal left MCA branches.  IMPRESSION: 1. Acute right MCA infarct with edema and perhaps some petechial hemorrhage.  2. Acute right subdural hematoma measuring 5 mm in thickness and superimposed on a small chronic postoperative extra-axial collection  3. Acute  subarachnoid hemorrhage, more so in the right hemisphere. Trace intraventricular hemorrhage. Suggestion of a small volume of hemorrhage within the chronic right hemisphere treatment site.  4. Trace leftward midline shift.  No ventriculomegaly.  5. Abnormal intracranial MRA with new mid basilar stenosis and diffuse medium-sized vessel irregularity and tandem stenoses. Favor Vasa spasm.  6. Right MCA branches obscured by hyperintense subarachnoid hemorrhage.  7. The right ICA terminus bilobed aneurysm appears stable compared to 04/11/2013.  Dr. Hilda Lias , who is in the operating room at the time of this report, was contacted and asked to call me regarding this case on 04/20/2013 at 13:06.  Electronically Signed: By: Augusto Gamble M.D. On: 04/20/2013 13:24    Assessment/Plan: 52 y/o with resection right parietal astrocytoma in 2012, with acute right cortical CVA, recent SAH thought to be most likely postraumatic/or due to ruptured aneurysm, and symptomatic focal motor seizures left arm, persistent despite ativan, dilantin, and keppra. Recommend: 1) Load with 200 mg IV vimpat now and continue 100 mg IV BID starting tomorrow. 2) Give additional 750 mg IV keppra now and increase daily dose to 1,000 mg BID. 3) Dilantin level now and adjust dilantin accordingly. Will follow up.  Wyatt Portela, MD  [facility] [phone]  04/20/2013, 5:52 PM   Seizures under control at this moment on IV keppra, IV vimpat, IV dilantin ( subtherapeutic dilantin level but no further seizures noted). Findings of new right cortical infarct and SAH due to possible ruptured SAH versus trauma. Will ask stroke team to follow up.  Wyatt Portela, MD

## 2013-04-21 ENCOUNTER — Other Ambulatory Visit (HOSPITAL_COMMUNITY): Payer: PRIVATE HEALTH INSURANCE

## 2013-04-21 DIAGNOSIS — F29 Unspecified psychosis not due to a substance or known physiological condition: Secondary | ICD-10-CM

## 2013-04-21 DIAGNOSIS — I609 Nontraumatic subarachnoid hemorrhage, unspecified: Secondary | ICD-10-CM

## 2013-04-21 DIAGNOSIS — C719 Malignant neoplasm of brain, unspecified: Secondary | ICD-10-CM

## 2013-04-21 DIAGNOSIS — I671 Cerebral aneurysm, nonruptured: Secondary | ICD-10-CM

## 2013-04-21 DIAGNOSIS — Z923 Personal history of irradiation: Secondary | ICD-10-CM

## 2013-04-21 LAB — BASIC METABOLIC PANEL
CO2: 24 mEq/L (ref 19–32)
Calcium: 9.5 mg/dL (ref 8.4–10.5)
Chloride: 108 mEq/L (ref 96–112)
Glucose, Bld: 124 mg/dL — ABNORMAL HIGH (ref 70–99)
Potassium: 4.2 mEq/L (ref 3.5–5.1)
Sodium: 140 mEq/L (ref 135–145)

## 2013-04-21 MED ORDER — SULFAMETHOXAZOLE-TMP DS 800-160 MG PO TABS
1.0000 | ORAL_TABLET | Freq: Two times a day (BID) | ORAL | Status: DC
  Administered 2013-04-21 – 2013-04-30 (×19): 1 via ORAL
  Filled 2013-04-21 (×20): qty 1

## 2013-04-21 MED ORDER — NIMODIPINE 30 MG PO CAPS
60.0000 mg | ORAL_CAPSULE | ORAL | Status: DC
  Administered 2013-04-21 (×3): 60 mg via ORAL
  Administered 2013-04-22 (×4): 30 mg via ORAL
  Filled 2013-04-21 (×13): qty 2

## 2013-04-21 NOTE — Progress Notes (Signed)
PT Cancellation Note  Patient Details Name: [name] MRN: [id number] DOB: [birth year]   Cancelled Treatment:    Reason Treat Not Completed: Pt preparing to go to IR, RN requesting deferral. Will continue to follow.   Rome Echavarria 04/21/2013, 11:53 AM Pager [phone]

## 2013-04-21 NOTE — H&P (Signed)
[name] is an 52 y.o. female.   Chief Complaint: scheduled for cerebral arteriogram Pt has hx of resection of brain astrocytoma and radiation in 2012. Noted change in ambulation and coordination at home x 7 days Noticed inability to swallow pills well also. 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 Fracture, ankle 2012    right  . History of radiation therapy 09/23/10- 11/07/10    right parietal lobe  . Chronic headaches     uses oxycodone appropriately    Past Surgical History  Procedure Laterality Date  . Orif ankle fracture  08/22/2010    right    Family History  Problem Relation Age of Onset  . Breast cancer Mother    Social History:  reports that she has never smoked. She has never used smokeless tobacco. She reports that she drinks alcohol. She reports that she does not use illicit drugs.  Allergies:  Allergies  Allergen Reactions  . Vasotec Other (See Comments)    Causes angioedema    Medications Prior to Admission  Medication Sig Dispense Refill  . clonazePAM (KLONOPIN) 0.5 MG tablet Take 1 tablet (0.5 mg total) by mouth 2 (two) times daily as needed for anxiety. For anxiety  30 tablet  0  . diazepam (VALIUM) 5 MG tablet Take 1-2 tabs PO, 30 minutes prior to MRI  2 tablet  0  . FLUoxetine (PROZAC) 20 MG tablet Take 30 mg by mouth 2 (two) times daily.        Marland Kitchen levETIRAcetam (KEPPRA) 500 MG tablet Take 1 tablet (500 mg total) by mouth every 12 (twelve) hours.  60 tablet  5  . Multiple Vitamin (MULTIVITAMIN) tablet Take 1 tablet by mouth daily.        Marland Kitchen oxyCODONE (OXY IR/ROXICODONE) 5 MG immediate release tablet Take 1 tablet (5 mg total) by mouth every 8 (eight) hours as needed for pain. 1-2 tabs po q 4 hr prn pain  30 tablet  0  .  phenytoin (DILANTIN) 100 MG ER capsule Take 2 capsules (200 mg total) by mouth 2 (two) times daily.  120 capsule  3  . prochlorperazine (COMPAZINE) 10 MG tablet Take 10 mg by mouth every 6 (six) hours as needed. For nausea      . traMADol (ULTRAM) 50 MG tablet Take 1 tablet (50 mg total) by mouth every 6 (six) hours as needed. Maximum dose= 8 tablets per day  60 tablet  3  . zolpidem (AMBIEN) 10 MG tablet Take 10 mg by mouth at bedtime as needed for sleep.        Results for orders placed during the hospital encounter of 04/18/13 (from the past 48 hour(s))  MRSA PCR SCREENING     Status: None   Collection Time    04/20/13  4:02 PM      Result Value Range   MRSA by PCR NEGATIVE  NEGATIVE   Comment:            The GeneXpert MRSA Assay (FDA     approved for NASAL specimens     only), is one component of a     comprehensive MRSA colonization     surveillance program. It is not     intended  to diagnose MRSA     infection nor to guide or     monitor treatment for     MRSA infections.  PHENYTOIN LEVEL, TOTAL     Status: Abnormal   Collection Time    04/20/13  7:47 PM      Result Value Range   Phenytoin Lvl 5.9 (*) 10.0 - 20.0 ug/mL   Mr Shirlee Latch ZO Contrast  04/20/2013   ADDENDUM REPORT: 04/20/2013 15:07  ADDENDUM: This case further discussed with [name], MD on 04/20/2013 at 15:03. He advises that the patient fell prior to (and perhaps days before) the onset of altered mental status.  That history would seem to favor posttraumatic intracranial hemorrhage which then may have resulted in the right MCA infarct on the basis of vasospasm.  We discussed the various aspects of this case with regard to risks and benefits of any proposed therapy.   Electronically Signed   By: Augusto Gamble M.D.   On: 04/20/2013 15:07   04/20/2013   ADDENDUM REPORT: 04/20/2013 13:37  ADDENDUM: Study discussed by telephone with Dr. Hilda Lias on 04/20/2013 at 13:35 .  The series in which the multiple acute  abnormalities in this case developed is unclear based on the imaging  (i.e. 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 We also discussed the pronounced intracranial vasospasm on MRA.   Electronically Signed   By: Augusto Gamble M.D.   On: 04/20/2013 13:37   04/20/2013   CLINICAL DATA:  52 year old female with history of astrocytoma status post surgery and radiation with subsequent slow cystic enlargement at the treatment site. Also chronic distal right ICA aneurysm. Acute altered mental status with acute intracranial hemorrhage. Initial encounter.  EXAM: MRI HEAD WITHOUT AND WITH CONTRAST  MRA HEAD WITHOUT CONTRAST  TECHNIQUE: Multiplanar, multiecho pulse sequences of the brain and surrounding structures were obtained without and with intravenous contrast. Angiographic images of the head were obtained using MRA technique without contrast.  CONTRAST:  13mL MULTIHANCE GADOBENATE DIMEGLUMINE 529 MG/ML IV SOLN  COMPARISON:  Head CT without contrast 04/18/2013. Intracranial CTA 04/11/2013. Brain MRI 03/04/2013 and earlier.  FINDINGS: MRI HEAD FINDINGS  The chronic right cystic lesion at the treatment site appears stable in size and configuration since 03/04/2013, measuring about 41 mm diameter. This cyst before had a deep tendon layer of dark T2 and T2* signal, which appears stable in volume since that time but now demonstrates increased FLAIR and T1 signal (series 14, image 68).  A small chronic postoperative extra-axial collection was present now with a superimposed new broad-based FLAIR hyperintense 5 mm collection with heterogeneous intrinsic T1 signal, presumed to be hemorrhage.  There is also superimposed right sylvian fissure and hemisphere subarachnoid hemorrhage, as well as new trace intraventricular hemorrhage. Small volume other basilar cisterns subarachnoid  hemorrhage including in the left sylvian fissure.  Superimposed on these changes is confluent restricted diffusion in the right (mostly posterior) MCA territory. Confluent involvement of the right parietal lobe, lateral occipital lobe and temporal lobe, and also the insula and operculum to a lesser degree. T2 and FLAIR hyperintensity in the affected cortex most resembles cytotoxic edema.  There is possible petechial hemorrhage associated with the infarct (series 13, images 12-10). Following contrast there is Mild luxury perfusion type enhancement. There is stable mild nodular enhancement at the treatment site (series 17, image  33).  Trace leftward midline shift. No ventriculomegaly. Basilar cisterns remain patent. Major intracranial vascular flow voids are stable. Left hemisphere gray and white matter signal is stable.  Stable orbits soft tissues, paranasal sinuses, mastoids and bone marrow signal. No acute scalp soft tissue finding identified.  MRA HEAD FINDINGS  Antegrade flow in the posterior circulation with codominant distal vertebral arteries and patent vertebrobasilar junction. Normal left PICA origin. Stable AICA origins.  Be on the AICA origins there is new focal narrowing of the basilar artery (series 1205, image 10), which appears genuine (see also series 16, image 16). The distal basilar remains patent despite this finding. SCA and PCA origins remain patent, however, there is now widespread moderate to severe PCA irregularity, worse on the right. This is a new finding, and results in tandem stenoses throughout the bilateral PCAs.  Preserved antegrade flow in both ICA siphons. 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 No right ICA stenosis identified. The right ICA terminus is patent, but there is moderate to severe irregularity in the right MCA M1 segment and also in both A1 segments.  Right MCA branches are obscured by intrinsically high signal subarachnoid hemorrhage in the right sylvian fissure.  Antegrade flow in the left ICA siphon with no left ICA stenosis identified. Left ophthalmic artery origin remains normal. Left carotid terminus remains patent. The left MCA and ACA origins remain patent. Moderate severe ACA branch irregularity bilaterally. Anterior communicating artery remains patent. Mild to moderate left A1 segment branch irregularity with moderate irregularity of the more distal left MCA branches.  IMPRESSION: 1. Acute right MCA infarct with edema and perhaps some petechial hemorrhage.  2. Acute right subdural hematoma measuring 5 mm in thickness and superimposed on a small chronic postoperative extra-axial collection  3. Acute subarachnoid hemorrhage, more so in the right hemisphere. Trace intraventricular hemorrhage. Suggestion of a small volume of hemorrhage within the chronic right hemisphere treatment site.  4. Trace leftward midline shift.  No ventriculomegaly.  5. Abnormal intracranial MRA with new mid basilar stenosis and diffuse medium-sized vessel irregularity and tandem stenoses. Favor Vasa spasm.  6. Right MCA branches obscured by hyperintense subarachnoid hemorrhage.  7. The right ICA terminus bilobed aneurysm appears stable compared to 04/11/2013.  Dr. Hilda Lias , who is in the operating room at the time of this report, was contacted and asked to call me regarding this case on 04/20/2013 at 13:06.  Electronically Signed: By: Augusto Gamble M.D. On: 04/20/2013 13:24   Mr Laqueta Jean WU Contrast  04/20/2013   ADDENDUM REPORT: 04/20/2013 15:07  ADDENDUM: This case further discussed with [name], MD on 04/20/2013 at 15:03. He advises that the patient fell prior to (and perhaps days before) the onset of altered mental status.  That history would seem to favor posttraumatic intracranial hemorrhage which then may have resulted in the right MCA infarct on the basis  of vasospasm.  We discussed the various aspects of this case with regard to risks and benefits of any proposed therapy.   Electronically Signed   By: Augusto Gamble M.D.   On: 04/20/2013 15:07   04/20/2013   ADDENDUM REPORT: 04/20/2013 13:37  ADDENDUM: Study discussed by telephone with Dr. Hilda Lias on 04/20/2013 at 13:35 .  The series in which the multiple acute abnormalities in this case developed is unclear based on the imaging  (  i.e. 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 We also discussed the pronounced intracranial vasospasm on MRA.   Electronically Signed   By: Augusto Gamble M.D.   On: 04/20/2013 13:37   04/20/2013   CLINICAL DATA:  52 year old female with history of astrocytoma status post surgery and radiation with subsequent slow cystic enlargement at the treatment site. Also chronic distal right ICA aneurysm. Acute altered mental status with acute intracranial hemorrhage. Initial encounter.  EXAM: MRI HEAD WITHOUT AND WITH CONTRAST  MRA HEAD WITHOUT CONTRAST  TECHNIQUE: Multiplanar, multiecho pulse sequences of the brain and surrounding structures were obtained without and with intravenous contrast. Angiographic images of the head were obtained using MRA technique without contrast.  CONTRAST:  13mL MULTIHANCE GADOBENATE DIMEGLUMINE 529 MG/ML IV SOLN  COMPARISON:  Head CT without contrast 04/18/2013. Intracranial CTA 04/11/2013. Brain MRI 03/04/2013 and earlier.  FINDINGS: MRI HEAD FINDINGS  The chronic right cystic lesion at the treatment site appears stable in size and configuration since 03/04/2013, measuring about 41 mm diameter. This cyst before had a deep tendon layer of dark T2 and T2* signal, which appears stable in volume since that time but now demonstrates increased FLAIR and T1 signal (series 14, image 68).  A small chronic postoperative  extra-axial collection was present now with a superimposed new broad-based FLAIR hyperintense 5 mm collection with heterogeneous intrinsic T1 signal, presumed to be hemorrhage.  There is also superimposed right sylvian fissure and hemisphere subarachnoid hemorrhage, as well as new trace intraventricular hemorrhage. Small volume other basilar cisterns subarachnoid hemorrhage including in the left sylvian fissure.  Superimposed on these changes is confluent restricted diffusion in the right (mostly posterior) MCA territory. Confluent involvement of the right parietal lobe, lateral occipital lobe and temporal lobe, and also the insula and operculum to a lesser degree. T2 and FLAIR hyperintensity in the affected cortex most resembles cytotoxic edema.  There is possible petechial hemorrhage associated with the infarct (series 13, images 12-10). Following contrast there is Mild luxury perfusion type enhancement. There is stable mild nodular enhancement at the treatment site (series 17, image 33).  Trace leftward midline shift. No ventriculomegaly. Basilar cisterns remain patent. Major intracranial vascular flow voids are stable. Left hemisphere gray and white matter signal is stable.  Stable orbits soft tissues, paranasal sinuses, mastoids and bone marrow signal. No acute scalp soft tissue finding identified.  MRA HEAD FINDINGS  Antegrade flow in the posterior circulation with codominant distal vertebral arteries and patent vertebrobasilar junction. Normal left PICA origin. Stable AICA origins.  Be on the AICA origins there is new focal narrowing of the basilar artery (series 1205, image 10), which appears genuine (see also series 16, image 16). The distal basilar remains patent despite this finding. SCA and PCA origins remain patent, however, there is now widespread moderate to severe PCA irregularity, worse on the right. This is a new finding, and results in tandem stenoses throughout the bilateral PCAs.  Preserved  antegrade flow in both ICA siphons. 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 No right ICA stenosis identified. The right ICA terminus is patent, but there is moderate to severe irregularity in the right MCA M1 segment and also in both A1 segments. Right MCA branches  are obscured by intrinsically high signal subarachnoid hemorrhage in the right sylvian fissure.  Antegrade flow in the left ICA siphon with no left ICA stenosis identified. Left ophthalmic artery origin remains normal. Left carotid terminus remains patent. The left MCA and ACA origins remain patent. Moderate severe ACA branch irregularity bilaterally. Anterior communicating artery remains patent. Mild to moderate left A1 segment branch irregularity with moderate irregularity of the more distal left MCA branches.  IMPRESSION: 1. Acute right MCA infarct with edema and perhaps some petechial hemorrhage.  2. Acute right subdural hematoma measuring 5 mm in thickness and superimposed on a small chronic postoperative extra-axial collection  3. Acute subarachnoid hemorrhage, more so in the right hemisphere. Trace intraventricular hemorrhage. Suggestion of a small volume of hemorrhage within the chronic right hemisphere treatment site.  4. Trace leftward midline shift.  No ventriculomegaly.  5. Abnormal intracranial MRA with new mid basilar stenosis and diffuse medium-sized vessel irregularity and tandem stenoses. Favor Vasa spasm.  6. Right MCA branches obscured by hyperintense subarachnoid hemorrhage.  7. The right ICA terminus bilobed aneurysm appears stable compared to 04/11/2013.  Dr. Hilda Lias , who is in the operating room at the time of this report, was contacted and asked to call me regarding this case on 04/20/2013 at 13:06.  Electronically Signed: By: Augusto Gamble M.D. On: 04/20/2013 13:24    Review of Systems  Constitutional:  Negative for fever.  Musculoskeletal: Positive for falls.  Neurological: Positive for seizures, weakness and headaches.    Blood pressure 136/71, pulse 65, temperature 98 F (36.7 C), temperature source Axillary, resp. rate 20, height 5\' 4"  (1.626 m), weight 136 lb 9.6 oz (61.961 kg), last menstrual period 08/26/2010, SpO2 100.00%. Physical Exam  Constitutional: She appears well-nourished.  Left face droop  Cardiovascular: Normal rate and regular rhythm.   No murmur heard. Respiratory: Effort normal and breath sounds normal. She has no wheezes.  GI: Soft. Bowel sounds are normal. There is no tenderness.  Musculoskeletal:  Pt asleep- difficult to arouse  Skin: Skin is warm.  Psychiatric:  husband has consented for procedure     Assessment/Plan SDH/SAH; R ICA aneurysm Seizures Coordination issues; falls at home Scheduled for Cerebral arteriogram pts husband aware of procedure benefits and risks and agreeable to proceed Consent signed and in chart  Hx astrocytoma resection 2012  Teliyah Royal A 04/21/2013, 12:23 PM
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
## 2013-04-21 NOTE — Progress Notes (Signed)
OT Cancellation Note  Patient Details Name: [name] MRN: [id number] DOB: 04-14-1961   Cancelled Treatment:    Reason Eval/Treat Not Completed:  Pt preparing to go to IR, RN requesting deferral. Will continue to follow.  Evern Bio 04/21/2013, 11:36 AM

## 2013-04-21 NOTE — Progress Notes (Signed)
Pt seen and examined. Transferred to ICU last pm. No issues last night.  EXAM: Temp:  [97.8 F (36.6 C)-98.8 F (37.1 C)] 98.3 F (36.8 C) (11/20 0758) Pulse Rate:  [57-83] 58 (11/20 0800) Resp:  [12-18] 18 (11/20 0800) BP: (113-156)/(65-100) 142/72 mmHg (11/20 0800) SpO2:  [94 %-100 %] 97 % (11/20 0800) Intake/Output     11/19 0701 - 11/20 0700 11/20 0701 - 11/21 0700   P.O. 240    I.V. (mL/kg) 825 (13.3) 75 (1.2)   Total Intake(mL/kg) 1065 (17.2) 75 (1.2)   Urine (mL/kg/hr) 250 (0.2)    Total Output 250     Net +815 +75        Urine Occurrence 1 x     Somnolent but arousable. Opens eyes to voice Oriented to person, hospital, year EOMI Left facial droop FC, good strength RUE/RLE 2-3/5 LUE, 4/5 LLE  LABS: Lab Results  Component Value Date   CREATININE 1.34* 04/18/2013   BUN 25* 04/18/2013   NA 138 04/18/2013   K 3.9 04/18/2013   CL 98 04/18/2013   CO2 27 04/18/2013   Lab Results  Component Value Date   WBC 5.0 04/18/2013   HGB 12.3 04/18/2013   HCT 34.7* 04/18/2013   MCV 88.3 04/18/2013   PLT 121* 04/18/2013    IMAGING: MRI/MRA demonstrates small R SDH, primarily right sylvian SAH, and diffuse severe bilateral PCA spasm. Right ICA aneurysm appears stable  IMPRESSION: - 52 y.o. female with SAH and aneurysm, unclear whether SAH secondary to recent fall vs aneurysm rupture. Diffuse spasm suggests aneurysm rupture, but blood pattern is more suggestive of trauma with SDH.  PLAN: - Diagnostic cerebral angiogram today  I spoke to the patient's husband regarding the imaging findings and unclear etiology of her SAH - aneurysm vs trauma. I told him that at this point we are concerned that the aneurysm could be the cause and therefore the next step in workup is diagnostic angiogram. I explained the procedure to him including the risks (stroke, hematoma, contrast reaction). He understood our discussion and provided verbal consent.
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
## 2013-04-21 NOTE — Progress Notes (Addendum)
Speech Language Pathology Treatment: Dysphagia  Patient Details Name: [name] MRN: [id number] DOB: [birth year] Today's Date: 04/21/2013 Time: 1100-1108 SLP Time Calculation (min): 8 min  Assessment / Plan / Recommendation Clinical Impression  Pt. Sleeping soundly and unable to adequately arouse to consume po's with total verbal and tactile cues. Oral care provided in buccal cavity and lateral sulci.  RN reported pocketing pill in right oral cavity (unable to be crushed) and food in left.  Recommend diet texture downgrade to Dys 1, continue nectar with full supervision/assist ONLY when adequately alert.  SLP will continue to follow and recommend MBS once she can maintain alertness.  Needs speech-language-cognitive assessment, will request order next date.    HPI HPI: 52 yr old patient taken to the emergency room yesterday because incoordination, falls up to the point that according to her husband she is getting more sleepy. CT-angio done lat week showed a dista carotid aneurysm. Ct head in the er shows sah most likely traumatic with some fluisd in the cyst.  MRI showed acute right MCA infarct with edema and perhaps some petechial hemorrhage, acute right subdural hematoma measuring 5 mm in thickness and superimposed on a small chronic postoperative extra-axial collection, acute subarachnoid hemorrhage, more so in the right hemisphere, trace intraventricular hemorrhage, suggestion of a small volume of hemorrhage within the chronic right hemisphere treatment site, trace leftward midline shift. No ventriculomegaly.   Pertinent Vitals  WDL's  SLP Plan  Continue with current plan of care    Recommendations Diet recommendations: Dysphagia 1 (puree);Nectar-thick liquid Liquids provided via: Cup;No straw Supervision: Staff to assist with self feeding;Full supervision/cueing for compensatory strategies Compensations: Slow rate;Small sips/bites Postural Changes and/or Swallow Maneuvers: Seated upright  90 degrees;Upright 30-60 min after meal              Oral Care Recommendations: Oral care Q4 per protocol Follow up Recommendations: Inpatient Rehab Plan: Continue with current plan of care    GO     Royce Macadamia M.Ed [employer] [phone]  04/21/2013

## 2013-04-21 NOTE — Progress Notes (Signed)
Patient ID: [name], female   DOB: [birth year], 52 y.o.   MRN: [id number] Stable. More awake, swallows with help. No more seizure activity. For angio today by dr Conchita Paris

## 2013-04-21 NOTE — Progress Notes (Signed)
Attempted TCD at the patient's bedside, however the patient has left the unit for a cerebral angiogram. We will try again tomorrow 04/22/13.  04/21/2013 3:08 PM Gertie Fey, RVT, RDCS, RDMS

## 2013-04-22 ENCOUNTER — Inpatient Hospital Stay (HOSPITAL_COMMUNITY): Payer: PRIVATE HEALTH INSURANCE

## 2013-04-22 MED ORDER — MIDAZOLAM HCL 2 MG/2ML IJ SOLN
INTRAMUSCULAR | Status: AC
  Filled 2013-04-22: qty 2

## 2013-04-22 MED ORDER — IOHEXOL 300 MG/ML  SOLN
150.0000 mL | Freq: Once | INTRAMUSCULAR | Status: AC | PRN
  Administered 2013-04-22: 75 mL via INTRAVENOUS

## 2013-04-22 MED ORDER — SODIUM CHLORIDE 0.9 % IV SOLN: INTRAVENOUS | Status: DC

## 2013-04-22 MED ORDER — FENTANYL CITRATE 0.05 MG/ML IJ SOLN
INTRAMUSCULAR | Status: AC | PRN
  Administered 2013-04-22 (×2): 12.5 ug via INTRAVENOUS

## 2013-04-22 MED ORDER — NIMODIPINE 30 MG PO CAPS
30.0000 mg | ORAL_CAPSULE | ORAL | Status: DC
  Administered 2013-04-22 – 2013-04-23 (×3): 30 mg via ORAL
  Filled 2013-04-22 (×12): qty 1

## 2013-04-22 MED ORDER — PHENYTOIN SODIUM EXTENDED 30 MG PO CAPS
250.0000 mg | ORAL_CAPSULE | Freq: Two times a day (BID) | ORAL | Status: DC
  Administered 2013-04-22 – 2013-04-28 (×12): 250 mg via ORAL
  Filled 2013-04-22 (×13): qty 5

## 2013-04-22 MED ORDER — MIDAZOLAM HCL 2 MG/2ML IJ SOLN
INTRAMUSCULAR | Status: AC | PRN
  Administered 2013-04-22 (×2): 0.5 mg via INTRAVENOUS

## 2013-04-22 MED ORDER — FENTANYL CITRATE 0.05 MG/ML IJ SOLN
INTRAMUSCULAR | Status: AC
  Filled 2013-04-22: qty 2

## 2013-04-22 NOTE — Evaluation (Signed)
Speech Language Pathology Evaluation Patient Details Name: [name] MRN: [id number] DOB: 10-12-1960 Today's Date: 04/22/2013 Time: 7829-5621 SLP Time Calculation (min): 21 min  Problem List:  Patient Active Problem List   Diagnosis Date Noted  . SAH (subarachnoid hemorrhage) 04/21/2013  . Cerebral aneurysm, nonruptured 04/21/2013  . Protein-calorie malnutrition, severe 04/19/2013  . History of radiation therapy   . 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 Fracture, ankle 2012    right  . History of radiation therapy 09/23/10- 11/07/10    right parietal lobe  . Chronic headaches     uses oxycodone appropriately   Past Surgical History:  Past Surgical History  Procedure Laterality Date  . Orif ankle fracture  08/22/2010    right   HPI:  52 yr old patient taken to the emergency room yesterday because incoordination, falls up to the point that according to her husband she is getting more sleepy. CT-angio done lat week showed a dista carotid aneurysm. Ct head in the er shows sah most likely traumatic with some fluisd in the cyst.  MRI showed acute right MCA infarct with edema and perhaps some petechial hemorrhage, acute right subdural hematoma measuring 5 mm in thickness and superimposed on a small chronic postoperative extra-axial collection, acute subarachnoid hemorrhage, more so in the right hemisphere, trace intraventricular hemorrhage, suggestion of a small volume of hemorrhage within the chronic right hemisphere treatment site, trace leftward midline shift. No ventriculomegaly.   Assessment / Plan / Recommendation Clinical Impression  Speech-language-cognitive assessment completed.  Pt. exhibits moderate dysarthria characterized by decreased ROM of articulators and low vocal intensity.  Pt.'s verbal cognitive abilities appear more intact than her functional  performance.  She demonstrates decreased initiation, delayed processing/responses, intermittent lability, decreased working and prospective memory, decrease functional problem solving and awareness.  ST will continue to work with pt. to enhance and facilitate cognitive-communicative abilities.      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[employer])   Cognition  Overall Cognitive Status: Impaired/Different from baseline Arousal/Alertness:  (awake, keeps eyes closed, drowsy) Orientation Level: Oriented to person;Oriented to place;Oriented to time;Disoriented to situation Attention: Sustained Sustained Attention: Impaired Sustained Attention Impairment: Functional basic Memory: Impaired Memory Impairment: Decreased short term memory;Decreased recall of new information;Prospective memory Decreased Short Term Memory: Verbal basic;Functional basic Awareness: Impaired Awareness Impairment: Intellectual impairment;Emergent impairment;Anticipatory impairment Problem Solving: Impaired Problem Solving Impairment: Functional basic (verbal problem solving better than functional) Executive Function: Self Correcting;Self Monitoring;Initiating;Decision Making;Organizing;Sequencing Behaviors: Lability Safety/Judgment: Impaired    Comprehension  Auditory Comprehension Overall Auditory Comprehension: Appears within functional limits for tasks assessed Yes/No Questions: Within Functional Limits Commands: Within Functional Limits Conversation: Simple Interfering  Components: Attention;Processing speed EffectiveTechniques: Extra processing time Visual Recognition/Discrimination Discrimination: Not tested Reading Comprehension Reading Status:  (TBD)    Expression Expression Primary Mode of Expression: Verbal Verbal Expression Overall Verbal Expression: Appears within functional limits for tasks assessed Initiation: Impaired Level of Generative/Spontaneous Verbalization: Sentence Repetition: No impairment Naming:  Not tested (do not suspect impairments) Pragmatics: Impairment Impairments: Abnormal affect;Eye contact Written Expression Dominant Hand: Right Written Expression:  (TBD)   Oral / Motor Oral Motor/Sensory Function Overall Oral Motor/Sensory Function: Impaired Labial ROM: Reduced left Labial Symmetry: Abnormal symmetry left Labial Strength: Reduced Labial Sensation: Reduced Lingual ROM: Reduced left Lingual Strength: Reduced Lingual Sensation: Reduced Facial ROM: Reduced left Facial Symmetry: Left droop Facial Strength: Reduced Facial Sensation: Reduced Velum: Within Functional Limits Mandible: Within Functional Limits Motor Speech Overall Motor Speech: Impaired Respiration: Within functional limits Phonation: Low vocal intensity Resonance: Within functional limits Articulation: Within functional limitis Intelligibility: Intelligibility reduced Word: 75-100% accurate Phrase: 50-74% accurate Sentence: 50-74% accurate Conversation: 25-49% accurate Motor Planning: Witnin functional limits   GO     Breck Coons [employer].Ed [employer] [phone]  04/22/2013

## 2013-04-22 NOTE — Progress Notes (Signed)
PT Cancellation Note  Patient Details Name: [name] MRN: [id number] DOB: 08-Mar-1961   Cancelled Treatment:    Reason Eval/Treat Not Completed: Patient at procedure or test/unavailable.  Pt in IR and will be on bedrest after procedure.    Burwell Bethel 04/22/2013, 3:06 PM  Jake Shark, PT DPT [phone]

## 2013-04-22 NOTE — Progress Notes (Signed)
UR completed.  Therapies recommending CIR at this time.   Carlyle Lipa, RN BSN MHA CCM Trauma/Neuro ICU Case Manager [phone]

## 2013-04-22 NOTE — Progress Notes (Signed)
NUTRITION FOLLOW UP  DOCUMENTATION CODES  Per approved criteria   -Severe malnutrition in the context of acute illness or injury    Intervention:    1. Continue Ensure Complete po BID, each supplement provides 350 kcal and 13 grams of protein, once diet advanced.   Nutrition Dx:   Inadequate oral intake related to decreased appetite as evidenced by patient report; ongoing.   Goal:  Pt to meet >/= 90% of their estimated nutrition needs, not met.   Monitor:  PO & supplemental intake, weight, labs, I/O's  Assessment:   Patient with PMH of chronic headaches presented to ER because of incoordination; CT-angio done last week which showed a dista carotid aneurysm; CT head in ER showed SAH most likely traumatic with some fluid in the cyst.  Pt transferred to ICU due to twitching noticed by floor RN. Neuro consulted, seizures are currently controlled. Pt NPO for angio in IR today to assess aneurysm (unable to do IR yesterday due to hypotension). SLP following pt and plans to do a MBS 11/22.   Height: Ht Readings from Last 1 Encounters:  04/18/13 5\' 4"  (1.626 m)    Weight Status:   Wt Readings from Last 1 Encounters:  04/18/13 136 lb 9.6 oz (61.961 kg)  No new weight  Re-estimated needs:  Kcal: 1600-1800  Protein: 75-85 gm  Fluid: 1.6-1.8 L  Skin: no issues noted  Diet Order: NPO   Intake/Output Summary (Last 24 hours) at 04/22/13 1052 Last data filed at 04/22/13 1000  Gross per 24 hour  Intake   1770 ml  Output    650 ml  Net   1120 ml    Last BM: 11/16   Labs:   Recent Labs Lab 04/18/13 1153 04/21/13 1203  NA 138 140  K 3.9 4.2  CL 98 108  CO2 27 24  BUN 25* 28*  CREATININE 1.34* 1.20*  CALCIUM 10.6* 9.5  GLUCOSE 109* 124*    CBG (last 3)  No results found for this basename: GLUCAP,  in the last 72 hours  Scheduled Meds: . dexamethasone  4 mg Intravenous Q6H  . feeding supplement (ENSURE COMPLETE)  237 mL Oral QPC supper  . FLUoxetine  30 mg Oral  BID  . levETIRAcetam  1,000 mg Oral Q12H  . multivitamin with minerals  1 tablet Oral Daily  . niMODipine  30 mg Oral Q4H  . pantoprazole  40 mg Oral Daily  . phenytoin  200 mg Oral BID  . sulfamethoxazole-trimethoprim  1 tablet Oral Q12H    Continuous Infusions: . sodium chloride 150 mL/hr at 04/22/13 1000    Kendell Bane RD, LDN, CNSC [phone] Pager [phone] After Hours Pager

## 2013-04-22 NOTE — Progress Notes (Signed)
Patient ID: [name], female   DOB: 1960/10/05, 53 y.o.   MRN: [id number] Satble, post angio, moves all 4 extremities. Cerebral angio seen . Two aneurysms? WITH VASOSPASMS. NO MORE SEIZURE EPISODES. SPOKE WITH FATHER.
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
## 2013-04-22 NOTE — ED Notes (Signed)
Patient denies pain and is resting comfortably.  

## 2013-04-22 NOTE — ED Notes (Signed)
Patient denies pain and is resting comfortably with eyes closed 

## 2013-04-22 NOTE — Progress Notes (Signed)
Spoke to Dr Thad Ranger about pts low BP, trying the nimotop 30mg  every 2 hours, instead of 60mg  every 4 hrs. The pts blood pressure has increased and been more stable.

## 2013-04-22 NOTE — Procedures (Signed)
S/P 4 vessel cerebral arteriogram. RT CFA approach.. Difficult study secondary to persistent motion.  Findings. 1.35mm x 6mm irrregular  RIGHT ICA post wall ?pCom aneurysm. 2.RT ICA 5,37mm x 5mm  periophthalmic aneurysm 3.Multiple focal areas of narrowing involving both ACA and MCA distributions,? due to vasospasm. Occluded RT MCA ,distal periinsular and parietal branches.Marland Kitchen

## 2013-04-22 NOTE — Progress Notes (Signed)
VASCULAR LAB PRELIMINARY  PRELIMINARY  PRELIMINARY  PRELIMINARY  Transcranial Doppler  Date POD PCO2 HCT BP  MCA ACA PCA OPHT SIPH VERT Basilar  04-22-13 SB     Right  Left   34  44   -37  -26   23  20   15  17   23  30    -26  -38     -24         Right  Left                                            Right  Left                                             Right  Left                                             Right  Left                                            Right  Left                                            Right  Left                                        MCA = Middle Cerebral Artery      OPHT = Opthalmic Artery     BASILAR = Basilar Artery   ACA = Anterior Cerebral Artery     SIPH = Carotid Siphon PCA = Posterior Cerebral Artery   VERT = Verterbral Artery                   Normal MCA = 62+\-12 ACA = 50+\-12 PCA = 42+\-23         Derotha Fishbaugh, RVT 04/22/2013, 3:54 PM

## 2013-04-23 ENCOUNTER — Inpatient Hospital Stay (HOSPITAL_COMMUNITY): Payer: PRIVATE HEALTH INSURANCE

## 2013-04-23 NOTE — Procedures (Signed)
Objective Swallowing Evaluation: Modified Barium Swallowing Study  Patient Details  Name: [name] MRN: [id number] Date of Birth: [birth year]  Today's Date: 04/23/2013 Time: 1030-1100 SLP Time Calculation (min): 30 min  Past Medical History:  Past Medical History  Diagnosis Date  . 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 Fracture, ankle 2012    right  . History of radiation therapy 09/23/10- 11/07/10    right parietal lobe  . Chronic headaches     uses oxycodone appropriately   Past Surgical History:  Past Surgical History  Procedure Laterality Date  . Orif ankle fracture  08/22/2010    right   HPI: 52 yr old patient taken to the ED 04/18/13 because of incoordination and falls up to the point that according to her husband she is getting more sleepy. 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 CT head in the ED shows SAH, most likely traumatic with some fluid in the cyst.  MRI showed acute right MCA infarct with edema and perhaps some petechial hemorrhage, acute right subdural hematoma measuring 5 mm in thickness and superimposed on a small chronic postoperative extra-axial collection, acute subarachnoid hemorrhage, more so in the right hemisphere, trace intraventricular hemorrhage, suggestion of a small volume of hemorrhage within the chronic right hemisphere treatment site, trace leftward midline shift. No ventriculomegaly.  MBS requested to objectively assess swallow function and safety due to reports of difficulty with meds.     Assessment / Plan / Recommendation Clinical Impression  Dysphagia Diagnosis: Moderate oral phase dysphagia;Moderate pharyngeal phase dysphagia Clinical impression: Moderate oropharyngeal sensory and otor based dysphagia, characterized by decreased bolus formation and extended posterior propulsion with slight post-swallow left oral residue noted after MBS completed. Pharyngeally, pt exhibits delayed swallow across  consistencies, which increases aspiration risk.  Trace SILENT aspiration noted on thin liquids, seen during the swallow due to delayed reflex.  Nectar thick consistency via straw revealed intermittent and trace flash penetration, even with large consecutive boluses.  Left oral leakage noted with liquids via cup.  Improved oral control noted with straw use.  Barium tablet was noted to clear the oropharynx without difficulty or delay, when given with liquids.  Will recomend dys 2 diet (fine chop) with nectar thick liquid via straw, oral care before and after meals with particular attention to left sulcus.  Pills may be given whole, one at a time, with either nectar thick liquids or puree.  Written Safe Swallow Precautions sent with pt back to the unit.  ST to follow for diet tolerance and advancement    Treatment Recommendation  Therapy as outlined in treatment plan below    Diet Recommendation Dysphagia 2 (Fine chop);Nectar-thick liquid   Liquid Administration via: Straw Medication Administration: Whole meds with puree (nectar thick liquid or puree) Supervision: Staff to assist with self feeding;Full supervision/cueing for compensatory strategies Compensations: Slow rate;Small sips/bites;Check for pocketing;Check for anterior loss Postural Changes and/or Swallow Maneuvers: Seated upright 90 degrees;Upright 30-60 min after meal;Out of bed for meals    Other  Recommendations Oral Care Recommendations: Oral care before and after PO (particular attention to left sulcus.) Other Recommendations: Order thickener from pharmacy;Clarify dietary restrictions;Remove water pitcher   Follow Up Recommendations  Inpatient Rehab    Frequency and Duration min 2x/week  2 weeks   Pertinent Vitals/Pain VSS, no pain reported.    SLP Swallow Goals  see care plan   General Date of Onset: 04/18/13 HPI: 52 yr old patient  taken to the ED  taken to the ED 04/18/13 because of incoordination and falls up to the point that according to  her husband she is getting more sleepy. 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 CT head in the ED shows SAH, most likely traumatic with some fluid in the cyst.  MRI showed acute right MCA infarct with edema and perhaps some petechial hemorrhage, acute right subdural hematoma measuring 5 mm in thickness and superimposed on a small chronic postoperative extra-axial collection, acute subarachnoid hemorrhage, more so in the right hemisphere, trace intraventricular hemorrhage, suggestion of a small volume of hemorrhage within the chronic right hemisphere treatment site, trace leftward midline shift. No ventriculomegaly.  MBS requested to objectively assess swallow function and safety due to reports of difficulty with meds. Type of Study: Modified Barium Swallowing Study Reason for Referral: Objectively evaluate swallowing function Previous Swallow Assessment: n/a Diet Prior to this Study: NPO Temperature Spikes Noted: No Respiratory Status: Room air History of Recent Intubation: No Behavior/Cognition: Alert;Cooperative;Pleasant mood;Requires cueing Oral Cavity - Dentition: Adequate natural dentition Oral Motor / Sensory Function: Impaired - see Bedside swallow eval Self-Feeding Abilities: Able to feed self;Needs assist;Needs set up Patient Positioning: Upright in chair Baseline Vocal Quality: Low vocal intensity;Clear Volitional Cough: Weak Volitional Swallow: Able to elicit Anatomy: Within functional limits Pharyngeal Secretions: Not observed secondary MBS    Reason for Referral Objectively evaluate swallowing function   Oral Phase Oral Preparation/Oral Phase Oral Phase: Impaired Oral - Thin Oral - Thin Cup: Left anterior bolus loss Oral - Solids Oral - Multi-consistency: Reduced posterior propulsion;Weak lingual manipulation Oral Phase - Comment Oral Phase - Comment: Trace left oral residue at the end of the study   Pharyngeal Phase Pharyngeal Phase Pharyngeal Phase:  Impaired Pharyngeal - Nectar Pharyngeal - Nectar Teaspoon: Delayed swallow initiation;Premature spillage to pyriform sinuses;Premature spillage to valleculae;Reduced airway/laryngeal closure;Penetration/Aspiration during swallow Penetration/Aspiration details (nectar teaspoon): Material enters airway, remains ABOVE vocal cords then ejected out Pharyngeal - Nectar Straw: Delayed swallow initiation;Premature spillage to valleculae;Premature spillage to pyriform sinuses Pharyngeal - Thin Pharyngeal - Thin Cup: Delayed swallow initiation;Premature spillage to pyriform sinuses;Reduced airway/laryngeal closure;Penetration/Aspiration after swallow;Penetration/Aspiration during swallow;Trace aspiration Penetration/Aspiration details (thin cup): Material enters airway, passes BELOW cords without attempt by patient to eject out (silent aspiration) Pharyngeal - Thin Straw: Delayed swallow initiation;Premature spillage to valleculae;Premature spillage to pyriform sinuses;Reduced airway/laryngeal closure;Penetration/Aspiration during swallow;Trace aspiration Penetration/Aspiration details (thin straw): Material enters airway, passes BELOW cords without attempt by patient to eject out (silent aspiration) Pharyngeal - Solids Pharyngeal - Puree: Delayed swallow initiation;Premature spillage to valleculae Pharyngeal - Multi-consistency: Delayed swallow initiation;Premature spillage to valleculae  Cervical Esophageal Phase    GO    Cervical Esophageal Phase Cervical Esophageal Phase: [facility]        Lenny Fiumara B. Murvin [facility], CCC-SLP [phone]  Leigh Aurora 04/23/2013, 11:48 AM

## 2013-04-23 NOTE — Progress Notes (Signed)
No new issues overnight. Patient was stable headache. No other complaints. No seizures.  Afebrile. Patient with significant hypotension when taking nimodipine. urine output good. Labs were stable. Patient awake and aware. She answers simple questions. She is mildly confused. She has a stable moderate left hemiparesis with 3-4/5 strength in her left upper and lower extremity. She follows commands readily on the right side with normal strength.  Studies yesterday demonstrated the presence of a large proximal internal carotid artery aneurysm on the right and a equally large posterior communicating artery segment aneurysm on the right as well. Patient was felt to probably have some element of middle cerebral artery thrombus versus spasm and it was decided to defer any acute treatment of these aneurysms.  Overall stable. The patient does have subarachnoid blood but it's not a large amount. Given her intolerance to nimodipine I think is reasonable to hold it currently. Continue supportive care and observation. Discussed with patient and family with regard to goals and treatment and possible aneurysm treatment versus observation alone.
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
## 2013-04-23 NOTE — Progress Notes (Signed)
Pt during the night received doses of nimodipine and with the dose being cut in half at 30 mg she still was having blood pressure dropping. Her SBP went into the 80's and map stayed in low 60's. Discussed with Dr Jordan Likes about how the medication affects her BP and he would like to hold the medication for now. So Discontinued the medication per MD.
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
## 2013-04-24 MED ORDER — WHITE PETROLATUM GEL
Status: AC
  Administered 2013-04-24: 0.2
  Filled 2013-04-24: qty 5
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
## 2013-04-24 NOTE — Progress Notes (Signed)
Overall stable. No new issues. Patient complains of mild to moderate headache. This is unchanged from yesterday. No other problems.  She is afebrile. Vitals are stable. She is awake and alert. She is oriented and appropriate. Her left hemiparesis is unchanged.  Overall stable. Continue ICU observation. Discussion with regard to possible aneurysm treatment tomorrow with Dr. Conchita Paris

## 2013-04-25 ENCOUNTER — Other Ambulatory Visit: Payer: Self-pay | Admitting: Neurosurgery

## 2013-04-25 MED ORDER — CLOPIDOGREL BISULFATE 75 MG PO TABS
75.0000 mg | ORAL_TABLET | Freq: Every day | ORAL | Status: DC
  Administered 2013-04-26 – 2013-05-05 (×10): 75 mg via ORAL
  Filled 2013-04-25 (×15): qty 1

## 2013-04-25 MED ORDER — SENNOSIDES-DOCUSATE SODIUM 8.6-50 MG PO TABS
2.0000 | ORAL_TABLET | Freq: Every evening | ORAL | Status: DC | PRN
  Filled 2013-04-25: qty 2

## 2013-04-25 MED ORDER — DOCUSATE SODIUM 100 MG PO CAPS
100.0000 mg | ORAL_CAPSULE | Freq: Two times a day (BID) | ORAL | Status: DC
  Administered 2013-04-25 – 2013-05-05 (×15): 100 mg via ORAL
  Filled 2013-04-25 (×22): qty 1

## 2013-04-25 MED ORDER — ASPIRIN 325 MG PO TABS
325.0000 mg | ORAL_TABLET | Freq: Every day | ORAL | Status: DC
  Administered 2013-04-26 – 2013-05-05 (×10): 325 mg via ORAL
  Filled 2013-04-25 (×11): qty 1

## 2013-04-25 MED ORDER — RESOURCE THICKENUP CLEAR PO POWD
ORAL | Status: DC | PRN
  Administered 2013-05-04: 14:00:00 via ORAL
  Filled 2013-04-25 (×3): qty 125

## 2013-04-25 NOTE — Progress Notes (Signed)
VASCULAR LAB PRELIMINARY  PRELIMINARY  PRELIMINARY  PRELIMINARY  Transcranial Doppler  Date POD PCO2 HCT BP  MCA ACA PCA OPHT SIPH VERT Basilar  04-22-13 SB     Right  Left   34  44   -37  -26   23  20   15  17   23  30    -26  -38     -24    04/25/13 MS     Right  Left   51  51   -22  -52   29  29   24  18    -37  38   -13  -26   *  *         Right  Left                                             Right  Left                                             Right  Left                                            Right  Left                                            Right  Left                                        MCA = Middle Cerebral Artery      OPHT = Opthalmic Artery     BASILAR = Basilar Artery   ACA = Anterior Cerebral Artery     SIPH = Carotid Siphon PCA = Posterior Cerebral Artery   VERT = Verterbral Artery                   Normal MCA = 62+\-12 ACA = 50+\-12 PCA = 42+\-23   *Unable to insonate  04/25/2013 3:01 PM Deonta Bomberger, RVT, RDCS, RDMS

## 2013-04-25 NOTE — Progress Notes (Signed)
Speech Language Pathology Treatment: Dysphagia  Patient Details Name: [name] MRN: [id number] DOB: 26-Dec-1960 Today's Date: 04/25/2013 Time: 4098-1191 SLP Time Calculation (min): 15 min  Assessment / Plan / Recommendation Clinical Impression  Pt tolerating Dysphagia 2 diet with nectar-thick liquids s/p 11/22 MBS.  Consumed nectars today with min cues for precautions; pt with inconsistent recall of results and recommendations after study.  RR exceeding 30 numerous times during session - cued pt to take rest breaks as needed if she experiences shortness of breath.    HPI HPI: 52 yr old patient taken to the ED 04/18/13 because of incoordination and falls up to the point that according to her husband she is getting more sleepy. 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 CT head in the ED shows SAH, most likely traumatic with some fluid in the cyst.  MRI showed acute right MCA infarct with edema and perhaps some petechial hemorrhage, acute right subdural hematoma measuring 5 mm in thickness and superimposed on a small chronic postoperative extra-axial collection, acute subarachnoid hemorrhage, more so in the right hemisphere, trace intraventricular hemorrhage, suggestion of a small volume of hemorrhage within the chronic right hemisphere treatment site, trace leftward midline shift. No ventriculomegaly.   Pertinent Vitals RR > 30 intermittently when consuming POs  SLP Plan  Continue with current plan of care ; f/u for cognition and dysphagia.    Recommendations Diet recommendations: Dysphagia 2 (fine chop);Nectar-thick liquid Liquids provided via: Cup;No straw Medication Administration: Whole meds with puree Supervision: Staff to assist with self feeding;Full supervision/cueing for compensatory strategies Compensations: Slow rate;Small sips/bites;Check for pocketing;Check for anterior loss Postural Changes and/or Swallow Maneuvers: Seated upright 90 degrees;Upright 30-60 min after  meal;Out of bed for meals              Oral Care Recommendations: Oral care before and after PO Follow up Recommendations: Inpatient Rehab Plan: Continue with current plan of care   Shaneen Reeser L. Samson Frederic, [place] CCC/SLP Pager [phone]      Blenda Mounts Laurice 04/25/2013, 11:50 AM

## 2013-04-25 NOTE — Progress Notes (Signed)
I spoke with the patient and her father RE: the angiogram findings. I told them that at this point we are operating under the assumption that her subarachnoid hemorrhage is indeed aneurysmal in nature.  I therefore told him that the next up in her treatment would be securing her aneurysm.  Based on the angiographic findings, I do not believe she is a candidate for surgical ligation, or endovascular coiling given the bilobed shape. I therefore recommended treatment with the pipeline embolization device, even though this would be used in a subarachnoid hemorrhage setting.  I also explained to them that she would require dual antiplatelet therapy prior to placement of the stent even though she currently is recovering from an intracranial hemorrhage. The patient and her family appeared to understand our discussion. I will proceed with initiation of ASA and Plavix therapy in preparation for Pipeline embolization tentatively scheduled for 1 week from today, 05/02/13.
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
## 2013-04-25 NOTE — Progress Notes (Signed)
Physical Therapy Treatment Patient Details Name: [name] MRN: [id number] DOB: 02-28-1961 Today's Date: 04/25/2013 Time: 0812-0840 PT Time Calculation (min): 28 min  PT Assessment / Plan / Recommendation  History of Present Illness HPI: patient taken to the emergency room yesterday because incoordination, falls up to the point that according to her husband she is getting more sleepy. 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 Ct head in the er shows sah most likely traumatic with some fluisd in the cyst.   PT Comments   Pt ambulation progression mostly limited by pt's anxiety re: falling. Pt tolerated standing activity better when able to hold onto bed rail in front of patient. Pt remains appropriate for CIR for maximal functional recovery.   Follow Up Recommendations  CIR     Does the patient have the potential to tolerate intense rehabilitation     Barriers to Discharge        Equipment Recommendations   (TBD)    Recommendations for Other Services    Frequency Min 4X/week   Progress towards PT Goals Progress towards PT goals: Progressing toward goals  Plan Current plan remains appropriate;Frequency needs to be updated    Precautions / Restrictions Precautions Precautions: Fall Precaution Comments: pt with increased anxiety re: falling Restrictions Weight Bearing Restrictions: No   Pertinent Vitals/Pain Reports L foot pain from previous sprain from previous fall.    Mobility  Bed Mobility Bed Mobility: Supine to Sit;Sitting - Scoot to Edge of Bed Supine to Sit: 4: Min assist Sitting - Scoot to Edge of Bed: 3: Mod assist Details for Bed Mobility Assistance: pt able to move bilat LEs off EOB, assist for L UE management and trunk elevation. Pt unable to use L UE to assist functionally Transfers Transfers: Sit to Stand;Stand to Sit;Stand Pivot Transfers Sit to Stand: 1: +2 Total assist;From bed;From chair/3-in-1 Sit to Stand: Patient Percentage:  50% Stand to Sit: 1: +2 Total assist;To chair/3-in-1 Stand to Sit: Patient Percentage: 50% Stand Pivot Transfers: 1: +2 Total assist Stand Pivot Transfers: Patient Percentage: 50% Details for Transfer Assistance: assist to advance L LE, max directional v/c's. Pt with strong posterior push with L LE extensor spastic tone. Pt required max directional tactile cues at hips to achieve weight-shift and hip/knee bending Ambulation/Gait Ambulation/Gait Assistance: Not tested (comment) (limtied by anxiety re: falling) Modified Rankin (Stroke Patients Only) Pre-Morbid Rankin Score: Moderate disability Modified Rankin: Severe disability    Exercises Other Exercises Other Exercises: maxA to complete L hip flexion in standing. maxA required due to spastice extensor tone in L LE Other Exercises: mini squats with minA at L LE tom promote L knee bending, tactile cues provided at posterior knee   PT Diagnosis:    PT Problem List:   PT Treatment Interventions:     PT Goals (current goals can now be found in the care plan section) Acute Rehab PT Goals Patient Stated Goal: not stated  Visit Information  Last PT Received On: 04/25/13 Assistance Needed: +2 History of Present Illness: HPI: patient taken to the emergency room yesterday because incoordination, falls up to the point that according to her husband she is getting more sleepy. 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 Ct head in the er shows sah most likely traumatic with some fluisd in the cyst.    Subjective Data  Subjective: Pt received supine in bed with report "I still can't feel my left side." Patient Stated Goal: not stated  Cognition  Cognition Arousal/Alertness: Awake/alert Behavior During Therapy: Anxious Overall Cognitive Status: Impaired/Different from baseline Area of Impairment: Safety/judgement;Problem solving Safety/Judgement: Decreased awareness of safety Problem Solving: Difficulty sequencing;Requires  verbal cues;Requires tactile cues General Comments: pt anxiety hindering factor for mobility progression    Balance  Balance Balance Assessed: Yes Static Sitting Balance Static Sitting - Balance Support: Feet supported Static Sitting - Level of Assistance: 4: Min assist Static Sitting - Comment/# of Minutes: 5 min Static Standing Balance Static Standing - Balance Support: Bilateral upper extremity supported Static Standing - Level of Assistance: 3: Mod assist Static Standing - Comment/# of Minutes: stood holding onto bed rail, modA to prevent pushing back onto chair and to maintain equal weight-shift  End of Session PT - End of Session Equipment Utilized During Treatment: Gait belt Activity Tolerance: Patient tolerated treatment well (limited by anxiety) Patient left: in chair;with call bell/phone within reach Nurse Communication: Mobility status   GP     Marcene Brawn 04/25/2013, 10:10 AM  Lewis Shock, PT, DPT Pager #: [phone] Office #: [phone]

## 2013-04-25 NOTE — Progress Notes (Signed)
Patient ID: [name], female   DOB: 1960-07-15, 52 y.o.   MRN: [id number] Awake, f/c. Left hemiparesiss. Vs wnl/ dr Lottie Rater to see and decide about what will the next step for her aneurysms

## 2013-04-26 DIAGNOSIS — I671 Cerebral aneurysm, nonruptured: Secondary | ICD-10-CM

## 2013-04-26 DIAGNOSIS — I729 Aneurysm of unspecified site: Secondary | ICD-10-CM

## 2013-04-26 MED ORDER — DEXAMETHASONE 4 MG PO TABS
4.0000 mg | ORAL_TABLET | Freq: Four times a day (QID) | ORAL | Status: DC
  Administered 2013-04-26 – 2013-05-04 (×31): 4 mg via ORAL
  Filled 2013-04-26 (×39): qty 1

## 2013-04-26 MED ORDER — ENSURE COMPLETE PO LIQD
237.0000 mL | Freq: Two times a day (BID) | ORAL | Status: DC
  Administered 2013-04-26 – 2013-05-05 (×15): 237 mL via ORAL

## 2013-04-26 MED ORDER — PAROXETINE HCL ER 25 MG PO TB24
50.0000 mg | ORAL_TABLET | Freq: Every day | ORAL | Status: DC
  Administered 2013-04-26 – 2013-05-05 (×9): 50 mg via ORAL
  Filled 2013-04-26 (×10): qty 2
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
## 2013-04-26 NOTE — Clinical Social Work Note (Signed)
Clinical Social Worker continuing to follow patient and family for support and discharge planning needs.  Patient is scheduled for Pipeline Embolization on Monday 12/01.  PT recommendations continue to state inpatient rehab upon discharge.  Patient has been faxed to [facility] and does have bed offers available.  Patient will remain hospitalized until procedure takes place.  CSW to follow up with patient family to provide bed offers.  CSW remains available for support and to facilitate patient discharge needs once medically stable.  Macario Golds, [place] [phone]
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
## 2013-04-26 NOTE — Progress Notes (Addendum)
[name], Rehab Admsissions Coordinator [phone] is following this pt for potential admission to inpt rehab when medically ready.

## 2013-04-26 NOTE — Progress Notes (Signed)
Speech Language Pathology Treatment: Cognitive-Linquistic;Dysphagia  Patient Details Name: [name] MRN: [id number] DOB: 03-01-1961 Today's Date: 04/26/2013 Time: 0347-4259 SLP Time Calculation (min): 35 min  Assessment / Plan / Recommendation Clinical Impression  Pt has shown good progress since last seen by this SLP.  Pt appears to tolerate dys 2/nectar thick liquids, however, did exhibit SILENT aspiration on [facility] Saturday 11/22.  Safe swallow precautions posted at [facility], reviewed with pt/son.  Pt also given exercises to increase oral motor strength and function, also reviewed with pt/son. She will require assistance with these exercises, as she was unable to complete them without significant cueing.  Pt reported feeling confused, especially with multiple people in the room, and has "difficulty with what the doctors say".  This increases pt anxiety (per pt report). Pt was encouraged to have husband or RN present to clarify/answer questions.  RN also notified.   HPI HPI: 52 yr old patient taken to the ED 04/18/13 because of incoordination and falls up to the point that according to her husband she is getting more sleepy. 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 CT head in the ED shows SAH, most likely traumatic with some fluid in the cyst.  MRI showed acute right MCA infarct with edema and perhaps some petechial hemorrhage, acute right subdural hematoma measuring 5 mm in thickness and superimposed on a small chronic postoperative extra-axial collection, acute subarachnoid hemorrhage, more so in the right hemisphere, trace intraventricular hemorrhage, suggestion of a small volume of hemorrhage within the chronic right hemisphere treatment site, trace leftward midline shift. No ventriculomegaly.  MBS requested to objectively assess swallow function and safety due to reports of difficulty with meds.  SLE revealed comm/cog deficits as well    Pertinent Vitals VSS   SLP Plan  Continue  with current plan of care    Recommendations Diet recommendations: Dysphagia 2 (fine chop);Nectar-thick liquid Liquids provided via: Straw Medication Administration: Whole meds with puree Supervision: Staff to assist with self feeding;Full supervision/cueing for compensatory strategies Compensations: Slow rate;Small sips/bites;Check for pocketing;Check for anterior loss Postural Changes and/or Swallow Maneuvers: Seated upright 90 degrees;Upright 30-60 min after meal;Out of bed for meals              General recommendations: Rehab consult Oral Care Recommendations: Oral care before and after PO Follow up Recommendations: Inpatient Rehab Plan: Continue with current plan of care    GO    Kelee Cunningham B. Murvin Natal [facility], CCC-SLP [phone] Leigh [place] 04/26/2013, 1:42 PM

## 2013-04-26 NOTE — Progress Notes (Signed)
NUTRITION FOLLOW UP  DOCUMENTATION CODES  Per approved criteria   -Severe malnutrition in the context of acute illness or injury    Intervention:    1. Increase Ensure Complete po to BID, each supplement provides 350 kcal and 13 grams of protein, once diet advanced.   Nutrition Dx:   Inadequate oral intake related to decreased appetite as evidenced by Meal Completion: 50%; ongoing.   Goal:  Pt to meet >/= 90% of their estimated nutrition needs, not met.   Monitor:  PO & supplemental intake, weight, labs, I/O's  Assessment:   Patient with PMH of chronic headaches presented to ER because of incoordination; CT-angio done last week which showed a dista carotid aneurysm; CT head in ER showed SAH most likely traumatic with some fluid in the cyst.  Pt transferred to ICU due to twitching noticed by floor RN. Neuro consulted, seizures are currently controlled. Per neurosurgery pt to have pipeline embolization next week for assumed aneurysm.  Pt is tearful sitting in chair. Per RN pt ate about 50% of her meal this am. Pt does not like magic cups but is willing to drink ensure. Per RN pt is doing well with her nectar-thickened liquids.   Height: Ht Readings from Last 1 Encounters:  04/18/13 5\' 4"  (1.626 m)    Weight Status:   Wt Readings from Last 1 Encounters:  04/18/13 136 lb 9.6 oz (61.961 kg)  No new weight; pt in chair  Re-estimated needs:  Kcal: 1600-1800  Protein: 75-85 gm  Fluid: 1.6-1.8 L  Skin: no issues noted  Diet Order: Dysphagia 2 with Nectar Thick Liquids Meal Completion: 50%   Intake/Output Summary (Last 24 hours) at 04/26/13 1001 Last data filed at 04/26/13 0900  Gross per 24 hour  Intake   4270 ml  Output   1175 ml  Net   3095 ml    Last BM: 11/25   Labs:   Recent Labs Lab 04/21/13 1203  NA 140  K 4.2  CL 108  CO2 24  BUN 28*  CREATININE 1.20*  CALCIUM 9.5  GLUCOSE 124*    CBG (last 3)  No results found for this basename: GLUCAP,  in  the last 72 hours  Scheduled Meds: . aspirin  325 mg Oral Daily  . clopidogrel  75 mg Oral Q breakfast  . dexamethasone  4 mg Intravenous Q6H  . docusate sodium  100 mg Oral BID  . feeding supplement (ENSURE COMPLETE)  237 mL Oral QPC supper  . FLUoxetine  30 mg Oral BID  . levETIRAcetam  1,000 mg Oral Q12H  . multivitamin with minerals  1 tablet Oral Daily  . pantoprazole  40 mg Oral Daily  . phenytoin  250 mg Oral BID  . sulfamethoxazole-trimethoprim  1 tablet Oral Q12H    Continuous Infusions:    Kendell Bane RD, LDN, CNSC [phone] Pager [phone] After Hours Pager

## 2013-04-26 NOTE — Progress Notes (Signed)
UR completed.  Await surgery on 12/1 and recovery to know what level of care will be required for patient at d/c.   Carlyle Lipa, RN BSN MHA CCM Trauma/Neuro ICU Case Manager [phone]

## 2013-04-26 NOTE — Progress Notes (Signed)
Pt seen and examined. No issues overnight. Pt reports feeling well, some HA. Unchanged left hemiparesis. Tolerating diet.  EXAM: Temp:  [97.7 F (36.5 C)-98.6 F (37 C)] 98.6 F (37 C) (11/25 1559) Pulse Rate:  [70-105] 88 (11/25 1700) Resp:  [15-43] 19 (11/25 1700) BP: (112-142)/(60-93) 114/61 mmHg (11/25 1700) SpO2:  [96 %-100 %] 100 % (11/25 1700) Intake/Output     11/24 0701 - 11/25 0700 11/25 0701 - 11/26 0700   P.O. 830 720   I.V. (mL/kg) 3600 (58.1) 450 (7.3)   Total Intake(mL/kg) 4430 (71.5) 1170 (18.9)   Urine (mL/kg/hr) 1175 (0.8) 150 (0.2)   Total Output 1175 150   Net +3255 +1020        Urine Occurrence 1 x 2 x    Awake, alert, oriented Speech is slightly slurred but fluent and appropriate Good strength RUE/RLE 2-3/5 LUE, 3/5 LLE  LABS: Lab Results  Component Value Date   CREATININE 1.20* 04/21/2013   BUN 28* 04/21/2013   NA 140 04/21/2013   K 4.2 04/21/2013   CL 108 04/21/2013   CO2 24 04/21/2013   Lab Results  Component Value Date   WBC 5.0 04/18/2013   HGB 12.3 04/18/2013   HCT 34.7* 04/18/2013   MCV 88.3 04/18/2013   PLT 121* 04/18/2013    IMPRESSION: - 52 y.o. 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 - RMCA stroke with left hemiparesis stable  Spoke with pt and family again today about the plan for Pipeline embolization on Mon and the need for antiplatelet medication prior to stent placement. They are in agreement with the plan. PLAN: - Cont ASA/Plavix in preparation for Pipeline embolization on Mon, Dec 1 - Cont monitoring for hemorrhagic event while on antiplatelet medications. - Cont PT/OT/SLP

## 2013-04-26 NOTE — Progress Notes (Signed)
Patient ID: [name], female   DOB: 24-Apr-1961, 52 y.o.   MRN: [id number] Stable, eating with help. Decrease of ha. Dr Lottie Rater to take over her care

## 2013-04-26 NOTE — Progress Notes (Signed)
Physical Therapy Treatment Patient Details Name: [name] MRN: [id number] DOB: [birth year] Today's Date: 04/26/2013 Time: 4098-1191 PT Time Calculation (min): 35 min  PT Assessment / Plan / Recommendation  History of Present Illness HPI: patient taken to the emergency room yesterday because incoordination, falls up to the point that according to her husband she is getting more sleepy. 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 Ct head in the er shows sah most likely traumatic with some fluisd in the cyst.   PT Comments   Pt more emotional labile this date perseverating on doing therapy on her own. Pt con't to have difficulty in standing due to anxiety as well as L extensor tone. Pt remains to have no functional use of L UE at this time. Pt to con't to benefit from CIR upon d/c to achieve maximal functional recovery.   Follow Up Recommendations  CIR     Does the patient have the potential to tolerate intense rehabilitation     Barriers to Discharge        Equipment Recommendations  None recommended by PT    Recommendations for Other Services    Frequency Min 4X/week   Progress towards PT Goals Progress towards PT goals: Progressing toward goals  Plan Current plan remains appropriate;Frequency needs to be updated    Precautions / Restrictions Precautions Precautions: Fall Precaution Comments: pt with increased anxiety re: falling Restrictions Weight Bearing Restrictions: No   Pertinent Vitals/Pain Denies pain    Mobility  Bed Mobility Bed Mobility: Supine to Sit;Sitting - Scoot to Edge of Bed Supine to Sit: 3: Mod assist Sitting - Scoot to Edge of Bed: 3: Mod assist Details for Bed Mobility Assistance: pt requiring increased v/c's this date and increased time to complete task. modA to bring hips to EOB Transfers Transfers: Sit to Stand;Stand to Sit;Stand Pivot Transfers Sit to Stand: 1: +2 Total assist;From bed;From chair/3-in-1 Sit to Stand: Patient  Percentage: 40% Stand to Sit: 1: +2 Total assist;To chair/3-in-1 Stand to Sit: Patient Percentage: 40% Stand Pivot Transfers: 1: +2 Total assist Stand Pivot Transfers: Patient Percentage: 60% Details for Transfer Assistance: pt with significant difficult achieving standing this date requiring multiple attempts. Pt with strong extensor/posterior push despite max verbal/tactile cues. Pt with improved stand pvt transfer however. Ambulation/Gait Ambulation/Gait Assistance: Not tested (comment) Modified Rankin (Stroke Patients Only) Pre-Morbid Rankin Score: Moderate disability Modified Rankin: Severe disability    Exercises     PT Diagnosis:    PT Problem List:   PT Treatment Interventions:     PT Goals (current goals can now be found in the care plan section) Acute Rehab PT Goals Patient Stated Goal: not stated  Visit Information  Last PT Received On: 04/26/13 Assistance Needed: +2 History of Present Illness: HPI: patient taken to the emergency room yesterday because incoordination, falls up to the point that according to her husband she is getting more sleepy. 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 Ct head in the er shows sah most likely traumatic with some fluisd in the cyst.    Subjective Data  Subjective: Pt with report upon arrival "I know I have to do it but I can do it on my own." Patient Stated Goal: not stated   Cognition  Cognition Arousal/Alertness: Awake/alert Behavior During Therapy: Anxious (emotinoally labile) Overall Cognitive Status: Impaired/Different from baseline Area of Impairment: Safety/judgement;Problem solving Safety/Judgement: Decreased awareness of safety Awareness: Intellectual;Emergent Problem Solving: Difficulty sequencing;Requires verbal cues;Requires tactile cues;Decreased  initiation;Slow processing General Comments: pt extremely emotionally labile this date perseverating on "I know what I have to do but I can just do it on my  own."    Balance  Balance Balance Assessed: Yes Static Sitting Balance Static Sitting - Balance Support: Feet supported Static Sitting - Level of Assistance: 4: Min assist Static Sitting - Comment/# of Minutes: 3 min Static Standing Balance Static Standing - Balance Support: Bilateral upper extremity supported Static Standing - Level of Assistance: 3: Mod assist Static Standing - Comment/# of Minutes: stood x 2 min with wide base of support and talked to Dr. Pearlean Brownie. Bilat LEs in extension. pt requiring max v/c's to achieve bilat knee flex and hip flex to sit in chair  End of Session PT - End of Session Equipment Utilized During Treatment: Gait belt Activity Tolerance: Patient tolerated treatment well Patient left: in chair;with call bell/phone within reach Nurse Communication: Mobility status (emotionally labile)   GP     Marcene Brawn 04/26/2013, 10:39 AM  Lewis Shock, PT, DPT Pager #: [phone] Office #: [phone]

## 2013-04-27 ENCOUNTER — Inpatient Hospital Stay (HOSPITAL_COMMUNITY): Payer: PRIVATE HEALTH INSURANCE

## 2013-04-27 MED ORDER — LACOSAMIDE 200 MG PO TABS
100.0000 mg | ORAL_TABLET | Freq: Two times a day (BID) | ORAL | Status: DC
  Administered 2013-04-27 – 2013-04-28 (×4): 100 mg via ORAL
  Filled 2013-04-27 (×5): qty 1

## 2013-04-27 NOTE — Progress Notes (Signed)
Physical Therapy Treatment Patient Details Name: [name] MRN: [id number] DOB: March 30, 1961 Today's Date: 04/27/2013 Time: 4098-1191 PT Time Calculation (min): 17 min  PT Assessment / Plan / Recommendation  History of Present Illness HPI: patient taken to the emergency room yesterday because incoordination, falls up to the point that according to her husband she is getting more sleepy. 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 Ct head in the er shows sah most likely traumatic with some fluisd in the cyst.   PT Comments   Treatment limited this date by change in mental status. Pt taken for stat CT. PT to return as able.  Follow Up Recommendations  CIR     Does the patient have the potential to tolerate intense rehabilitation     Barriers to Discharge        Equipment Recommendations  None recommended by PT    Recommendations for Other Services Rehab consult;OT consult;Speech consult  Frequency Min 4X/week   Progress towards PT Goals Progress towards PT goals: Not progressing toward goals - comment (pt with Altered mental status)  Plan Current plan remains appropriate;Frequency needs to be updated    Precautions / Restrictions Precautions Precautions: Fall Restrictions Weight Bearing Restrictions: No   Pertinent Vitals/Pain Denies pain    Mobility  Bed Mobility Bed Mobility: Supine to Sit;Sit to Supine Supine to Sit: 1: +2 Total assist;HOB flat Supine to Sit: Patient Percentage: 30% Sitting - Scoot to Edge of Bed: 1: +2 Total assist Sitting - Scoot to Edge of Bed: Patient Percentage: 30% Sit to Supine: 1: +2 Total assist;HOB flat Sit to Supine: Patient Percentage: 30% Scooting to HOB: 1: +2 Total assist Details for Bed Mobility Assistance: pt did not assist with supine to sit EOB. Pt impulsively attempted to return to supine however unable to bring LEs back into bed. Transfers Transfers: Not assessed Ambulation/Gait Ambulation/Gait Assistance: Not  tested (comment) Modified Rankin (Stroke Patients Only) Pre-Morbid Rankin Score: Moderate disability Modified Rankin: Severe disability    Exercises     PT Diagnosis:    PT Problem List:   PT Treatment Interventions:     PT Goals (current goals can now be found in the care plan section)    Visit Information  Last PT Received On: 04/27/13 Assistance Needed: +2 History of Present Illness: HPI: patient taken to the emergency room yesterday because incoordination, falls up to the point that according to her husband she is getting more sleepy. 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 Ct head in the er shows sah most likely traumatic with some fluisd in the cyst.    Subjective Data  Subjective: Pt with increased lethargy upon arrival.    Cognition  Cognition Arousal/Alertness: Lethargic Behavior During Therapy:  (very lethargic, minimal eye opening) Overall Cognitive Status: Impaired/Different from baseline General Comments: pt with increased lethargy. RN in room and reports pt to have been wide awake 30 min ago. Pt reports she is at [facility] when 30 min ago she said [employer]. Pt unable to report age. Pt unable to maintain eye opening.    Balance  Static Sitting Balance Static Sitting - Balance Support: Feet supported Static Sitting - Level of Assistance: 1: +1 Total assist Static Sitting - Comment/# of Minutes: pt sat EOB 5 min, however unable to maintain upright position.   End of Session PT - End of Session Activity Tolerance: Treatment limited secondary to medical complications (Comment) Patient left: in chair;with call bell/phone within reach;with  nursing/sitter in room Nurse Communication: Mobility status   GP     Marcene Brawn 04/27/2013, 9:52 AM  Lewis Shock, PT, DPT Pager #: [phone] Office #: [phone]
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
## 2013-04-27 NOTE — Progress Notes (Signed)
Went into pts room while PT was attempting to get pt out of bed; pt not opening eyes or responding to commands. Pt oriented to self only. Dr Pearlean Brownie on unit and notified of pts neuro status change. STAT head CT ordered. Will continue to monitor pt.  Cyndie Chime [place]

## 2013-04-27 NOTE — Progress Notes (Signed)
Ct Head Wo Contrast  04/27/2013    1. Interval evolution of acute subarachnoid and intraventricular hemorrhage on the prior study, which is no longer well seen. No evidence of new intracranial hemorrhage. 2. Unchanged appearance of small, mixed density right cerebral convexity subdural fluid collection. 3. Evolving right MCA infarct.    Dr. Pearlean Brownie has reviewed CT. Discussed case. No change in CT - normal evolving - will check TCD for possible vasospasm as cause of altered mental status and lethargy.  Annie Main, MSN, RN, ANVP-BC, ANP-BC, [facility] Pager: [phone] 04/27/2013 9:37 AM  I have personally obtained a history, examined the patient, evaluated imaging results, and formulated the assessment and plan of care. I agree with the above.  Pramod Sethi  ADDENDUM ; CT head shows no acute changes TCD shows normal mean flow velocities and pulsatilities with no evidence of vasospasm Delia Heady, MD

## 2013-04-27 NOTE — Progress Notes (Addendum)
Pt seen and examined. No issues overnight. Pt found to be very sleepy this am when attempted to work with PT/OT. CT scan completed. Currently pt states she is sleepy, but feels "OK"  EXAM: Temp:  [97.9 F (36.6 C)-98.7 F (37.1 C)] 98.2 F (36.8 C) (11/26 0805) Pulse Rate:  [68-90] 85 (11/26 0900) Resp:  [13-22] 18 (11/26 0900) BP: (114-142)/(60-93) 131/80 mmHg (11/26 0900) SpO2:  [98 %-100 %] 100 % (11/26 0900) Intake/Output     11/25 0701 - 11/26 0700 11/26 0701 - 11/27 0700   P.O. 1320 120   I.V. (mL/kg) 450 (7.3)    Total Intake(mL/kg) 1770 (28.6) 120 (1.9)   Urine (mL/kg/hr) 600 (0.4) 50 (0.3)   Total Output 600 50   Net +1170 +70        Urine Occurrence 4 x     Drowsy, but easily arousable Oriented to person, place, year Follows commands all ext stable L hemiparesis  LABS: Lab Results  Component Value Date   CREATININE 1.20* 04/21/2013   BUN 28* 04/21/2013   NA 140 04/21/2013   K 4.2 04/21/2013   CL 108 04/21/2013   CO2 24 04/21/2013   Lab Results  Component Value Date   WBC 5.0 04/18/2013   HGB 12.3 04/18/2013   HCT 34.7* 04/18/2013   MCV 88.3 04/18/2013   PLT 121* 04/18/2013    IMAGING: CT reviewed without significant change compared to 11/17. ? Slightly decreased ventricle size, evolution of small right SDH, radiographic evolution of right MCA stroke, with persistent right frontal edema/radiation change.  IMPRESSION: - 52 y.o. female with SAH and SZ, SAH possible from RICA aneurysm. - Episodic somnolence with stable CTH concerning for continued SZ.  PLAN: - Add Vimpat 100mg  BID - Cont ASA/Plavix for planned Pipeline embolization on Mon - Cont supportive care with spasm prophylaxis - Cont PT/OT/SLP

## 2013-04-27 NOTE — Progress Notes (Signed)
VASCULAR LAB PRELIMINARY  PRELIMINARY  PRELIMINARY  PRELIMINARY  Transcranial Doppler  Date POD PCO2 HCT BP  MCA ACA PCA OPHT SIPH VERT Basilar  04-22-13 SB     Right  Left   34  44   -37  -26   23  20   15  17   23  30    -26  -38     -24    04/25/13 MS     Right  Left   51  51   -22  -52   29  29   24  18    -37  38   -13  -26   *  *    22-26-14 hc     Right  Left   72  59   -42  -39   30  28   24  22   26   37   -29  -26   -34            Right  Left                                             Right  Left                                            Right  Left                                            Right  Left                                        MCA = Middle Cerebral Artery      OPHT = Opthalmic Artery     BASILAR = Basilar Artery   ACA = Anterior Cerebral Artery     SIPH = Carotid Siphon PCA = Posterior Cerebral Artery   VERT = Verterbral Artery                   Normal MCA = 62+\-12 ACA = 50+\-12 PCA = 42+\-23   *Unable to insonate  Thereasa Parkin, RVT 04/27/2013 10:41 AM

## 2013-04-27 NOTE — Progress Notes (Signed)
OT Cancellation Note  Patient Details Name: DRISHTI PEPPERMAN MRN: [id number] DOB: [birth year]   Cancelled Treatment:    Reason Eval/Treat Not Completed: Medical issues which prohibited therapy Pt lethargic. Taken to scan. [facility] Unknown Schleyer, OTR/L  [phone] 04/27/2013 04/27/2013, 8:49 AM

## 2013-04-27 NOTE — Progress Notes (Signed)
Patient ID: [name], female   DOB: [birth year], 52 y.o.   MRN: [id number] Stable. No neuo changes. As per dr Lottie Rater

## 2013-04-28 MED ORDER — PHENYTOIN SODIUM EXTENDED 100 MG PO CAPS
200.0000 mg | ORAL_CAPSULE | Freq: Two times a day (BID) | ORAL | Status: DC
  Administered 2013-04-28: 200 mg via ORAL
  Filled 2013-04-28 (×3): qty 2
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
## 2013-04-28 NOTE — Progress Notes (Signed)
Patient ID: [name], female   DOB: 09-04-1960, 52 y.o.   MRN: [id number] Afeb, vss Awake,alert, slowly conversant No new issues or changes Plan is for interventional procedure Monday

## 2013-04-28 NOTE — Progress Notes (Signed)
Patient ID: [name], female   DOB: Dec 22, 1960, 52 y.o.   MRN: [id number] Slurred speech, awake. Ct head last night shows resolving sah. Dilantin level pending. Spoke with her family

## 2013-04-29 LAB — PHENYTOIN LEVEL, TOTAL: Phenytoin Lvl: 21.4 ug/mL — ABNORMAL HIGH (ref 10.0–20.0)

## 2013-04-29 MED ORDER — PHENYTOIN SODIUM EXTENDED 100 MG PO CAPS
300.0000 mg | ORAL_CAPSULE | Freq: Every day | ORAL | Status: DC
  Administered 2013-04-29 – 2013-04-30 (×2): 300 mg via ORAL
  Filled 2013-04-29 (×3): qty 3

## 2013-04-29 MED ORDER — LACOSAMIDE 50 MG PO TABS
50.0000 mg | ORAL_TABLET | Freq: Two times a day (BID) | ORAL | Status: DC
  Administered 2013-04-29 – 2013-05-05 (×12): 50 mg via ORAL
  Filled 2013-04-29 (×14): qty 1

## 2013-04-29 NOTE — Progress Notes (Signed)
Patient ID: [name], female   DOB: 01-30-1961, 52 y.o.   MRN: [id number] Afeb, vss No new neuro issues Somewhat more conversant today. For IR procedure Monday

## 2013-04-29 NOTE — Progress Notes (Signed)
Physical Therapy Treatment Patient Details Name: [name] MRN: [id number] DOB: [birth year] Today's Date: 04/29/2013 Time: 1340-1404 PT Time Calculation (min): 24 min  PT Assessment / Plan / Recommendation  History of Present Illness HPI: patient taken to the emergency room yesterday because incoordination, falls up to the point that according to her husband she is getting more sleepy. 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 Ct head in the er shows sah most likely traumatic with some fluisd in the cyst.   PT Comments   Limited session due to pt very emotional and agitated.  Pt stated "I want to do therapy on my terms." Pt continued to attempt to return to supine while in sitting.  Pt finally returned to sitting with minimal (A) from therapist.    Follow Up Recommendations  CIR     Does the patient have the potential to tolerate intense rehabilitation     Barriers to Discharge        Equipment Recommendations  None recommended by PT    Recommendations for Other Services    Frequency Min 4X/week   Progress towards PT Goals Progress towards PT goals: Not progressing toward goals - comment (due to pt agitated at this time. )  Plan Current plan remains appropriate;Frequency needs to be updated    Precautions / Restrictions Precautions Precautions: Fall Precaution Comments: Pt easily agitated and wanting to return to bed and refusing therapy at this time.  Restrictions Weight Bearing Restrictions: No   Pertinent Vitals/Pain No c/o pain; VSS    Mobility  Bed Mobility Bed Mobility: Supine to Sit;Sit to Supine Supine to Sit: 1: +2 Total assist;HOB flat Supine to Sit: Patient Percentage: 60% Sitting - Scoot to Edge of Bed: 1: +2 Total assist Sitting - Scoot to Edge of Bed: Patient Percentage: 30% Sit to Supine: 4: Min assist Scooting to [facility]: 1: +2 Total assist Scooting to [facility]: Patient Percentage: 0% Details for Bed Mobility Assistance: Pt able to get into  long sit with min (A) however needed total (A) to advance hips to EOB.  Pt attempted to return to supine during sitting EOB and very agitated Transfers Transfers: Not assessed Ambulation/Gait Ambulation/Gait Assistance: Not tested (comment) Modified Rankin (Stroke Patients Only) Pre-Morbid Rankin Score: Moderate disability Modified Rankin: Severe disability    Exercises     PT Diagnosis:    PT Problem List:   PT Treatment Interventions:     PT Goals (current goals can now be found in the care plan section) Acute Rehab PT Goals Patient Stated Goal: to go home PT Goal Formulation: With patient Time For Goal Achievement: 05/03/13 Potential to Achieve Goals: Fair  Visit Information  Last PT Received On: 04/29/13 Assistance Needed: +2 History of Present Illness: HPI: patient taken to the emergency room yesterday because incoordination, falls up to the point that according to her husband she is getting more sleepy. 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 Ct head in the er shows sah most likely traumatic with some fluisd in the cyst.    Subjective Data  Subjective: "I want to do therapy on my own terms."   Patient Stated Goal: to go home   Cognition  Cognition Arousal/Alertness: Lethargic Behavior During Therapy: Anxious Overall Cognitive Status: Impaired/Different from baseline Area of Impairment: Safety/judgement;Awareness;Problem solving Orientation Level: Disoriented to;Place Following Commands: Follows one step commands consistently Safety/Judgement: Decreased awareness of safety Awareness: Emergent Problem Solving: Slow processing General Comments: Pt with agitation and demanding  to return to supine.  Pt continue to state "I want to do therapy on my own terms."  Pt returned to supine.     Balance  Balance Balance Assessed: Yes Static Sitting Balance Static Sitting - Balance Support: Feet supported Static Sitting - Level of Assistance: 1: +1 Total  assist Static Sitting - Comment/# of Minutes: Pt continues to attempt to return to supine in sitting therefore needed total (A) to remain sitting.   End of Session PT - End of Session Equipment Utilized During Treatment: Gait belt Activity Tolerance: Treatment limited secondary to agitation Patient left: in bed;with call bell/phone within reach;with bed alarm set Nurse Communication: Mobility status   GP     Kelcey Korus 04/29/2013, 4:17 PM   Jake Shark, PT DPT [phone]
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
## 2013-04-29 NOTE — Clinical Social Work Note (Signed)
CSW talked with patient and father Conley Rolls ([phone]) who was at the bedside regarding discharge planning and provided them with facility responses. SNF list with responses provided to Mr. Delton See and he will assure patient's husband Durga Saldarriaga gets information. Family also provided with CSW Scinto name and phone number.  Genelle Bal, MSW, LCSW [phone]

## 2013-04-30 MED ORDER — LORAZEPAM 1 MG PO TABS
1.0000 mg | ORAL_TABLET | Freq: Four times a day (QID) | ORAL | Status: DC | PRN
  Administered 2013-04-30 – 2013-05-04 (×3): 1 mg via ORAL
  Filled 2013-04-30 (×3): qty 1
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
## 2013-04-30 NOTE — Progress Notes (Signed)
Subjective: Patient reports Arouses to voice follows commands.  Objective: Vital signs in last 24 hours: Temp:  [98.1 F (36.7 C)-98.8 F (37.1 C)] 98.2 F (36.8 C) (11/29 0800) Pulse Rate:  [70-100] 76 (11/29 0700) Resp:  [13-27] 18 (11/29 0700) BP: (113-137)/(63-93) 136/67 mmHg (11/29 0700) SpO2:  [93 %-100 %] 99 % (11/29 0700)  Intake/Output from previous day: 11/28 0701 - 11/29 0700 In: 750 [P.O.:750] Out: 250 [Urine:250] Intake/Output this shift: Total I/O In: -  Out: 125 [Urine:125]  Left facial noted.  Lab Results: No results found for this basename: WBC, HGB, HCT, PLT,  in the last 72 hours BMET No results found for this basename: NA, K, CL, CO2, GLUCOSE, BUN, CREATININE, CALCIUM,  in the last 72 hours  Studies/Results: No results found.  Assessment/Plan: 4 treatment of aneurysm with pipeline stent.  LOS: 12 days  Continue supportive care at present time. Asian is taking oral diet. We'll DC hep well as it is outdated.   Saleena Tamas J 04/30/2013, 10:09 AM

## 2013-05-01 LAB — PHENYTOIN LEVEL, TOTAL: Phenytoin Lvl: 23.5 ug/mL — ABNORMAL HIGH (ref 10.0–20.0)

## 2013-05-01 MED ORDER — PHENYTOIN SODIUM EXTENDED 100 MG PO CAPS
200.0000 mg | ORAL_CAPSULE | Freq: Every day | ORAL | Status: DC
  Administered 2013-05-01 – 2013-05-02 (×2): 200 mg via ORAL
  Filled 2013-05-01 (×3): qty 2

## 2013-05-01 NOTE — Progress Notes (Signed)
Subjective: Patient reports Offers no complaints today.  Objective: Vital signs in last 24 hours: Temp:  [97.7 F (36.5 C)-98.7 F (37.1 C)] 97.7 F (36.5 C) (11/30 1223) Pulse Rate:  [75-95] 90 (11/30 1500) Resp:  [15-39] 18 (11/30 1500) BP: (109-140)/(57-79) 114/66 mmHg (11/30 1500) SpO2:  [93 %-100 %] 93 % (11/30 1500)  Intake/Output from previous day: 11/29 0701 - 11/30 0700 In: 1406 [P.O.:1406] Out: 775 [Urine:775] Intake/Output this shift: Total I/O In: 480 [P.O.:480] Out: -   Left facial noted. Motor function appears stable otherwise.  Lab Results: No results found for this basename: WBC, HGB, HCT, PLT,  in the last 72 hours BMET No results found for this basename: NA, K, CL, CO2, GLUCOSE, BUN, CREATININE, CALCIUM,  in the last 72 hours  Studies/Results: No results found.  Assessment/patient to have endovascular treatment of aneurysm scheduled for tomorrow.  LOS: 13 days  Continue supportive care.   [name] J 05/01/2013, 3:31 PM
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
## 2013-05-02 ENCOUNTER — Ambulatory Visit (HOSPITAL_COMMUNITY)
Admit: 2013-05-02 | Discharge: 2013-05-02 | Disposition: A | Discharge status: Home / Self Care | Payer: PRIVATE HEALTH INSURANCE | Attending: Neurosurgery | Admitting: Neurosurgery

## 2013-05-02 ENCOUNTER — Inpatient Hospital Stay (HOSPITAL_COMMUNITY): Payer: PRIVATE HEALTH INSURANCE | Admitting: Certified Registered Nurse Anesthetist

## 2013-05-02 ENCOUNTER — Encounter (HOSPITAL_COMMUNITY): Payer: PRIVATE HEALTH INSURANCE | Admitting: Certified Registered Nurse Anesthetist

## 2013-05-02 ENCOUNTER — Encounter (HOSPITAL_COMMUNITY)
Admission: EM | Disposition: A | Discharge status: Rehabilitation Facility | Payer: Self-pay | Source: Home / Self Care | Attending: Neurosurgery

## 2013-05-02 ENCOUNTER — Encounter (HOSPITAL_COMMUNITY): Payer: Self-pay | Admitting: Certified Registered Nurse Anesthetist

## 2013-05-02 HISTORY — PX: RADIOLOGY WITH ANESTHESIA: SHX6223

## 2013-05-02 LAB — PHENYTOIN LEVEL, TOTAL: Phenytoin Lvl: 24.5 ug/mL — ABNORMAL HIGH (ref 10.0–20.0)

## 2013-05-02 LAB — CBC
MCH: 31.5 pg (ref 26.0–34.0)
MCHC: 34.8 g/dL (ref 30.0–36.0)
Platelets: 160 10*3/uL (ref 150–400)

## 2013-05-02 LAB — BASIC METABOLIC PANEL
BUN: 23 mg/dL (ref 6–23)
Calcium: 8.8 mg/dL (ref 8.4–10.5)
Chloride: 98 mEq/L (ref 96–112)
GFR calc Af Amer: 51 mL/min — ABNORMAL LOW (ref >90)
GFR calc non Af Amer: 44 mL/min — ABNORMAL LOW (ref >90)
Glucose, Bld: 117 mg/dL — ABNORMAL HIGH (ref 70–99)
Sodium: 135 mEq/L (ref 135–145)

## 2013-05-02 LAB — TYPE AND SCREEN
ABO/RH(D): B POS
Antibody Screen: NEGATIVE

## 2013-05-02 LAB — APTT: aPTT: 22 seconds — ABNORMAL LOW (ref 24–37)

## 2013-05-02 LAB — PROTIME-INR: Prothrombin Time: 13.2 seconds (ref 11.6–15.2)

## 2013-05-02 SURGERY — RADIOLOGY WITH ANESTHESIA
Anesthesia: General

## 2013-05-02 MED ORDER — NEOSTIGMINE METHYLSULFATE 1 MG/ML IJ SOLN
INTRAMUSCULAR | Status: DC | PRN
  Administered 2013-05-02: 5 mg via INTRAVENOUS

## 2013-05-02 MED ORDER — ONDANSETRON HCL 4 MG/2ML IJ SOLN
INTRAMUSCULAR | Status: DC | PRN
  Administered 2013-05-02: 4 mg via INTRAVENOUS

## 2013-05-02 MED ORDER — SODIUM CHLORIDE 0.9 % IV SOLN
10.0000 mg | INTRAVENOUS | Status: DC | PRN
  Administered 2013-05-02: 10 ug/min via INTRAVENOUS

## 2013-05-02 MED ORDER — DEXAMETHASONE SODIUM PHOSPHATE 4 MG/ML IJ SOLN
INTRAMUSCULAR | Status: DC | PRN
  Administered 2013-05-02: 8 mg via INTRAVENOUS

## 2013-05-02 MED ORDER — ROCURONIUM BROMIDE 100 MG/10ML IV SOLN
INTRAVENOUS | Status: DC | PRN
  Administered 2013-05-02 (×2): 50 mg via INTRAVENOUS
  Administered 2013-05-02: 30 mg via INTRAVENOUS

## 2013-05-02 MED ORDER — SODIUM CHLORIDE 0.9 % IV SOLN
INTRAVENOUS | Status: DC
  Administered 2013-05-02 – 2013-05-03 (×2): via INTRAVENOUS
  Administered 2013-05-04: 1000 mL via INTRAVENOUS
  Administered 2013-05-04 – 2013-05-05 (×2): via INTRAVENOUS

## 2013-05-02 MED ORDER — IOHEXOL 300 MG/ML  SOLN
150.0000 mL | Freq: Once | INTRAMUSCULAR | Status: AC | PRN
  Administered 2013-05-02: 50 mL via INTRA_ARTERIAL

## 2013-05-02 MED ORDER — CEFAZOLIN SODIUM-DEXTROSE 2-3 GM-% IV SOLR
INTRAVENOUS | Status: AC
  Administered 2013-05-02: 2 g via INTRAVENOUS
  Filled 2013-05-02: qty 50

## 2013-05-02 MED ORDER — HYDROMORPHONE HCL PF 1 MG/ML IJ SOLN: 0.2500 mg | INTRAMUSCULAR | Status: DC | PRN

## 2013-05-02 MED ORDER — LIDOCAINE HCL (CARDIAC) 20 MG/ML IV SOLN
INTRAVENOUS | Status: DC | PRN
  Administered 2013-05-02: 20 mg via INTRAVENOUS

## 2013-05-02 MED ORDER — ONDANSETRON HCL 4 MG/2ML IJ SOLN: 4.0000 mg | Freq: Once | INTRAMUSCULAR | Status: DC | PRN

## 2013-05-02 MED ORDER — PROPOFOL 10 MG/ML IV BOLUS
INTRAVENOUS | Status: DC | PRN
  Administered 2013-05-02: 110 mg via INTRAVENOUS

## 2013-05-02 MED ORDER — LACTATED RINGERS IV SOLN
INTRAVENOUS | Status: DC | PRN
  Administered 2013-05-02: 15:00:00 via INTRAVENOUS

## 2013-05-02 MED ORDER — ARTIFICIAL TEARS OP OINT
TOPICAL_OINTMENT | OPHTHALMIC | Status: DC | PRN
  Administered 2013-05-02: 1 via OPHTHALMIC

## 2013-05-02 MED ORDER — GLYCOPYRROLATE 0.2 MG/ML IJ SOLN
INTRAMUSCULAR | Status: DC | PRN
  Administered 2013-05-02: .8 mg via INTRAVENOUS

## 2013-05-02 MED ORDER — FENTANYL CITRATE 0.05 MG/ML IJ SOLN
INTRAMUSCULAR | Status: DC | PRN
  Administered 2013-05-02: 50 ug via INTRAVENOUS
  Administered 2013-05-02: 150 ug via INTRAVENOUS

## 2013-05-02 MED ORDER — LACTATED RINGERS IV SOLN
INTRAVENOUS | Status: DC | PRN
  Administered 2013-05-02: 14:00:00 via INTRAVENOUS

## 2013-05-02 NOTE — Anesthesia Preprocedure Evaluation (Addendum)
Anesthesia Evaluation  Patient identified by MRN, date of birth, ID band Patient awake    Reviewed: Allergy & Precautions, H&P , NPO status , Patient's Chart, lab work & pertinent test results  History of Anesthesia Complications Negative for: history of anesthetic complications  Airway Mallampati: II TM Distance: >3 FB Neck ROM: Full    Dental  (+) Teeth Intact   Pulmonary neg pulmonary ROS,    breath sounds clear to auscultation        Cardiovascular hypertension, Pt. on medications + Peripheral Vascular Disease Rhythm:Regular Rate:Normal     Neuro/Psych  Headaches, L hemiparesis, slurred speech R ICA aneurysm CVA, Residual Symptoms    GI/Hepatic negative GI ROS, Neg liver ROS,   Endo/Other  negative endocrine ROS  Renal/GU negative Renal ROS     Musculoskeletal negative musculoskeletal ROS (+)   Abdominal   Peds  Hematology negative hematology ROS (+)   Anesthesia Other Findings   Reproductive/Obstetrics negative OB ROS                        Anesthesia Physical Anesthesia Plan  ASA: III  Anesthesia Plan: General   Post-op Pain Management:    Induction: Intravenous  Airway Management Planned: Oral ETT  Additional Equipment: Arterial line  Intra-op Plan:   Post-operative Plan: Extubation in OR  Informed Consent: I have reviewed the patients History and Physical, chart, labs and discussed the procedure including the risks, benefits and alternatives for the proposed anesthesia with the patient or authorized representative who has indicated his/her understanding and acceptance.   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 Brain SAH and SDH with R. Brain infarct and R. Internal carotid  aneurysm Chronic seizures with new  breakthrough seizures 11/14 anxiety  Plan GA with art line, central line and ETT  Kipp Brood)      Anesthesia Quick Evaluation

## 2013-05-02 NOTE — Preoperative (Signed)
Beta Blockers   Reason not to administer Beta Blockers:Not Applicable 

## 2013-05-02 NOTE — Brief Op Note (Signed)
PREOP DX: SAH, RICA Aneurysm  POSTOP DX: Same  PROCEDURE: Pipeline embolization RICA aneurysm  SURGEON: Dr. [name], MD  ANESTHESIA: IV Sedation with Local  EBL: Minimal  SPECIMENS: None  COMPLICATIONS: None  CONDITION: Stable to recovery  FINDINGS: 1. Successful pipeline embolization RICA aneurysm without immediate complication 2. 3.75 x 18mm device placed

## 2013-05-02 NOTE — Transfer of Care (Signed)
Immediate Anesthesia Transfer of Care Note  Patient: [name]  Procedure(s) Performed: Procedure(s): RADIOLOGY WITH ANESTHESIA (N/A)  Patient Location: PACU  Anesthesia Type:General  Level of Consciousness: awake, alert  and oriented  Airway & Oxygen Therapy: Patient connected to face mask oxygen  Post-op Assessment: Report given to PACU RN  Post vital signs: stable  Complications: No apparent anesthesia complications

## 2013-05-02 NOTE — Clinical Social Work Note (Signed)
Clinical Social Worker continuing to follow patient and family for support and discharge planning needs.  Patient with SAH and right ICA aneurysm scheduled for Pipeline embolization today.  PT/OT continue to recommend inpatient rehab at discharge.  CSW has initiated SNF search and provided patient family with bed offers as alternative discharge plan.  CSW will continue to remain available for support and to facilitate patient discharge needs once medically stable.  Macario Golds, [place] [phone]

## 2013-05-02 NOTE — Anesthesia Postprocedure Evaluation (Signed)
Anesthesia Post Note  Patient: [name]  Procedure(s) Performed: Procedure(s) (LRB): RADIOLOGY WITH ANESTHESIA (N/A)  Anesthesia type: general  Patient location: PACU  Post pain: Pain level controlled  Post assessment: Patient's Cardiovascular Status Stable  Post vital signs: Reviewed and stable  Level of consciousness: sedated  Complications: No apparent anesthesia complications

## 2013-05-02 NOTE — Progress Notes (Signed)
Pt seen and examined. No issues overnight. Pt reports feeling ok.  EXAM: Temp:  [97.7 F (36.5 C)-99 F (37.2 C)] 98.3 F (36.8 C) (12/01 0700) Pulse Rate:  [76-100] 83 (12/01 0800) Resp:  [14-31] 18 (12/01 0800) BP: (112-127)/(62-76) 125/76 mmHg (12/01 0800) SpO2:  [93 %-100 %] 95 % (12/01 0800) Intake/Output     11/30 0701 - 12/01 0700 12/01 0701 - 12/02 0700   P.O. 1348    Total Intake(mL/kg) 1348 (21.8)    Urine (mL/kg/hr) 425 (0.3) 190 (0.7)   Total Output 425 190   Net +923 -190        Urine Occurrence 6 x     Awake, alert Speech slightly slurred Stable left hemiparesis  LABS: Lab Results  Component Value Date   CREATININE 1.20* 04/21/2013   BUN 28* 04/21/2013   NA 140 04/21/2013   K 4.2 04/21/2013   CL 108 04/21/2013   CO2 24 04/21/2013   Lab Results  Component Value Date   WBC 5.0 04/18/2013   HGB 12.3 04/18/2013   HCT 34.7* 04/18/2013   MCV 88.3 04/18/2013   PLT 121* 04/18/2013    IMPRESSION: - 52 y.o. female with SAH and right ICA aneurysm for Pipeline embolization today  PLAN: - Check CBC, BMP, PT/PTT - Consent obtained for procedure  I spoke again with the patient and her father about the procedure and associated risks inlcuding stroke, hemorrhage, groin hematoma, contrast reaction. I again answered all questions and consent was obtained from the patient and her father.

## 2013-05-02 NOTE — Progress Notes (Signed)
PT Cancellation Note  Patient Details Name: [name] MRN: [id number] DOB: June 12, 1960   Cancelled Treatment:    Reason Eval/Treat Not Completed:  (pt emotionally labile and agitated.) Pt crying upon PT arrival. Pt extremely anxious re: stent placement schedule for today and unable to focus to participate in PT. Pt refusing to attempt to sit EOB repeatedly stating "I don't want to be here any more than you do." PT to return as able.   Marcene Brawn 05/02/2013, 10:26 AM
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
## 2013-05-02 NOTE — Progress Notes (Signed)
SLP Cancellation Note  Patient Details Name: [name] MRN: [id number] DOB: 30-Aug-1960   Cancelled treatment:        Pt not able to participate in therapy at this time.  Pt leaving unit for OR at this time.  ST to continue efforts.  Setsuko Robins B. [place], [facility], CCC-SLP [phone]  Leigh Aurora 05/02/2013, 1:45 PM

## 2013-05-02 NOTE — Progress Notes (Signed)
Paged Dr. Jordan Likes at 2024 about the patient's central line and diet. Central line continues to actively bleed at insertion site and has a small hematoma present. Pressure dressing was applied. Patient's diet after Sx was a regular diet, patient's diet before Sx was a Dys 2 diet, asked for clarification. Dr. Jordan Likes responded back at 2030. He said to place a sand bag over the central line and to continue the dys 2 diet. The diet was changed to Dys 2 and the central line area was cleaned, reinforced and a 10 pound sand bag was applied to the right subclavian CVC. Will continue to monitor.

## 2013-05-02 NOTE — Progress Notes (Signed)
Patient received from Recovery via bed. VSS. Alert and oriented x4. Follows commands appropriately- squeeze R hand, wiggle toes, lift both legs off bed slightly with slight drift on L. Left arm remains flaccid. Right pupil 4 and sluggish, Left pupil 3 and sluggish. Right groin dressing clean dry and intact. No hematoma noted. Left pedal pulse 1+.  Patient with bloody drainage oozing from R [place] central line site with hematoma noted-dressing reinforced and bloody drainage from right side of mouth as per nursing report. Right radial aline intact-leveled and zeroed. Nursing shift change report given to [facility], Charity fundraiser. Nursing to continue to monitor.

## 2013-05-02 NOTE — Progress Notes (Signed)
Occupational Therapy Treatment Patient Details Name: [name] MRN: [id number] DOB: 02/04/1961 Today's Date: 05/02/2013 Time: 2841-3244 OT Time Calculation (min): 27 min  OT Assessment / Plan / Recommendation  History of present illness HPI: patient taken to the emergency room yesterday because incoordination, falls up to the point that according to her husband she is getting more sleepy. 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 Ct head in the er shows sah most likely traumatic with some fluisd in the cyst.   OT comments  Pt generally tearful.  Focus of visit today was educating pt's dad in L UE ROM, positioning, stimulation of pt toward L.  Pt performed grooming at bed level.  Incorporated L UE in activity through hand washing.  Follow Up Recommendations  CIR    Barriers to Discharge       Equipment Recommendations  None recommended by OT (TBD)    Recommendations for Other Services    Frequency Min 3X/week   Progress towards OT Goals Progress towards OT goals: Progressing toward goals  Plan Discharge plan remains appropriate    Precautions / Restrictions Precautions Precautions: Fall Precaution Comments: R UE hemiplegia Restrictions Weight Bearing Restrictions: No   Pertinent Vitals/Pain VSS, no pain reported   ADL  Grooming: Wash/dry face;Brushing hair;Wash/dry hands;Supervision/safety;Minimal assistance Where Assessed - Grooming: Supine, head of bed up ADL Comments: Pt with emotional lability.  Dad present and then friends coming for visit.  Instructed dad in positioning, ROM, and tactile stimulation of L UE.  Pt reporting UE feeling like it is "waking up" and aware of temperature.    OT Diagnosis:    OT Problem List:   OT Treatment Interventions:     OT Goals(current goals can now be found in the care plan section)    Visit Information  Last OT Received On: 05/02/13 Assistance Needed: +2 History of Present Illness: HPI: patient taken to the  emergency room yesterday because incoordination, falls up to the point that according to her husband she is getting more sleepy. 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 Ct head in the er shows sah most likely traumatic with some fluisd in the cyst.    Subjective Data      Prior Functioning       Cognition  Cognition Arousal/Alertness: Awake/alert Behavior During Therapy: Anxious (tearful) Overall Cognitive Status: Impaired/Different from baseline Area of Impairment: Safety/judgement;Awareness;Problem solving Following Commands: Follows one step commands consistently Problem Solving: Slow processing General Comments: Pt distracted by impending procedure today. Agreeable to bed level activity.    Mobility  Bed Mobility Scooting to HOB: 1: +2 Total assist Scooting to [facility]: Patient Percentage: 0%    Exercises  Other Exercises Other Exercises: L UE AAROM/ PROM all areas, trace elbow flex and extension only  Other Exercises: Instructed dad in speaking to pt to L side and touching and ranging L UE.   Balance     End of Session OT - End of Session Activity Tolerance: Patient tolerated treatment well Patient left: in bed;with call bell/phone within reach;with family/visitor present  GO     Evern Bio 05/02/2013, 9:53 AM [phone]

## 2013-05-02 NOTE — Progress Notes (Signed)
Patient ID: [name], female   DOB: 08-29-1960, 52 y.o.   MRN: [id number] More awake. Waiting for cerebral angio by dr Lottie Rater. Spoke with father

## 2013-05-03 NOTE — Progress Notes (Signed)
Pt seen and examined. No issues overnight. Pt reports feeling well, same as preop. Denies significant HA or visual changes.  EXAM: Temp:  [96.6 F (35.9 C)-98.4 F (36.9 C)] 98.2 F (36.8 C) (12/02 1600) Pulse Rate:  [61-88] 72 (12/02 1500) Resp:  [7-19] 15 (12/02 1500) BP: (93-139)/(55-79) 98/61 mmHg (12/02 1500) SpO2:  [94 %-100 %] 97 % (12/02 1500) Arterial Line BP: (120-173)/(47-86) 132/72 mmHg (12/02 0800) Intake/Output     12/01 0701 - 12/02 0700 12/02 0701 - 12/03 0700   P.O. 240 660   I.V. (mL/kg) 1825 (29.5) 600 (9.7)   Total Intake(mL/kg) 2065 (33.3) 1260 (20.3)   Urine (mL/kg/hr) 1685 (1.1) 100 (0.2)   Blood 50 (0)    Total Output 1735 100   Net +330 +1160         Awake, alert Speech slurred but appropriate Pupils reactive Left facial droop 2-3/5 LUE, 3-4/5 LLE 5/5 RUE/RLE Right groin site c/d/i, no hematoma  LABS: Lab Results  Component Value Date   CREATININE 1.34* 05/02/2013   BUN 23 05/02/2013   NA 135 05/02/2013   K 4.1 05/02/2013   CL 98 05/02/2013   CO2 26 05/02/2013   Lab Results  Component Value Date   WBC 4.8 05/02/2013   HGB 11.2* 05/02/2013   HCT 32.2* 05/02/2013   MCV 90.7 05/02/2013   PLT 160 05/02/2013    IMPRESSION: - 52 y.o. female POD #1 s/p Pipeline embolization RICA aneurysm - Neurologically at baseline  PLAN: - Can transfer to neuroscience floor - Cont daily ASA 325mg  and Plavix 75mg  - Cont PT/OT
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
## 2013-05-03 NOTE — Progress Notes (Signed)
Speech Language Pathology Treatment: Dysphagia;Cognitive-Linquistic  Patient Details Name: [name] MRN: [id number] DOB: 06-08-1960 Today's Date: 05/03/2013 Time: 4098-1191 SLP Time Calculation (min): 31 min  Assessment / Plan / Recommendation Clinical Impression  Pt demonstrates good improvement in arousal, speech intelligibility and awareness of deficits. Pt has an occasional hard cough /throat clear every 3-4 sips if tactile cues are not given to reduce rate and bolus size. Suspect build up of penetration with eventual sensation. Continue current diet. She continues to need moderate verbal and tactile cues for emergent awareness and use of compensatory strategies to carry out functional tasks appropriately. SLP will continue efforts.    HPI HPI: 52 yr old patient taken to the ED 04/18/13 because of incoordination and falls up to the point that according to her husband she is getting more sleepy. 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 CT head in the ED shows SAH, most likely traumatic with some fluid in the cyst.  MRI showed acute right MCA infarct with edema and perhaps some petechial hemorrhage, acute right subdural hematoma measuring 5 mm in thickness and superimposed on a small chronic postoperative extra-axial collection, acute subarachnoid hemorrhage, more so in the right hemisphere, trace intraventricular hemorrhage, suggestion of a small volume of hemorrhage within the chronic right hemisphere treatment site, trace leftward midline shift. No ventriculomegaly.  MBS requested to objectively assess swallow function and safety due to reports of difficulty with meds.  SLE revealed comm/cog deficits as well    Pertinent Vitals NA  SLP Plan  Continue with current plan of care    Recommendations Diet recommendations: Dysphagia 2 (fine chop);Nectar-thick liquid Liquids provided via: Cup;Straw Medication Administration: Whole meds with puree Supervision: Staff to assist  with self feeding;Full supervision/cueing for compensatory strategies Compensations: Slow rate;Small sips/bites;Check for pocketing;Check for anterior loss Postural Changes and/or Swallow Maneuvers: Seated upright 90 degrees;Upright 30-60 min after meal;Out of bed for meals              Plan: Continue with current plan of care    GO   [facility], MA CCC-SLP [phone]  Claudine Mouton 05/03/2013, 9:40 AM

## 2013-05-03 NOTE — Progress Notes (Signed)
Physical Therapy Treatment Patient Details Name: [name] MRN: [id number] DOB: February 04, 1961 Today's Date: 05/03/2013 Time: 4098-1191 PT Time Calculation (min): 29 min  PT Assessment / Plan / Recommendation  History of Present Illness HPI: patient taken to the emergency room yesterday because incoordination, falls up to the point that according to her husband she is getting more sleepy. 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 Ct head in the er shows sah most likely traumatic with some fluisd in the cyst.   PT Comments   Pt emotionally sound this date and more positive regarding rehab and therapy. Pt with onset of fatigue limiting OOB mobility progression this date however. Pt con't to have L sided weakness and inability to amb at this time. Pt con't to be appropriate for CIR upon d/c from hospital.   Follow Up Recommendations  CIR     Does the patient have the potential to tolerate intense rehabilitation     Barriers to Discharge        Equipment Recommendations       Recommendations for Other Services    Frequency Min 4X/week   Progress towards PT Goals Progress towards PT goals: Progressing toward goals  Plan Current plan remains appropriate;Frequency needs to be updated    Precautions / Restrictions Precautions Precautions: Fall Restrictions Weight Bearing Restrictions: No   Pertinent Vitals/Pain Denies pain    Mobility  Bed Mobility Bed Mobility: Supine to Sit;Sit to Supine Supine to Sit: 4: Min assist;With rails;HOB elevated Sit to Supine: 3: Mod assist;HOB flat Details for Bed Mobility Assistance: pt unable to use L UE functionally but able to bring LE off EOB Transfers Transfers: Sit to Stand;Stand to Sit Sit to Stand: 1: +2 Total assist;With upper extremity assist;From bed Sit to Stand: Patient Percentage: 40% Stand to Sit: 1: +2 Total assist;With upper extremity assist;To bed Details for Transfer Assistance: max tactile cues at L knee and  bilat hips to achieve extension. pt with strong retropulsion Ambulation/Gait Ambulation/Gait Assistance: Not tested (comment) Modified Rankin (Stroke Patients Only) Pre-Morbid Rankin Score: Moderate disability Modified Rankin: Severe disability    Exercises     PT Diagnosis:    PT Problem List:   PT Treatment Interventions:     PT Goals (current goals can now be found in the care plan section) Acute Rehab PT Goals PT Goal Formulation: With patient Time For Goal Achievement: 05/10/13 Potential to Achieve Goals: Fair  Visit Information  Last PT Received On: 05/03/13 Assistance Needed: +2 History of Present Illness: HPI: patient taken to the emergency room yesterday because incoordination, falls up to the point that according to her husband she is getting more sleepy. 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 Ct head in the er shows sah most likely traumatic with some fluisd in the cyst.    Subjective Data  Subjective: pt less emotional this date   Cognition  Cognition Arousal/Alertness: Awake/alert Behavior During Therapy: Flat affect Overall Cognitive Status: Impaired/Different from baseline Area of Impairment: Safety/judgement;Awareness;Problem solving Following Commands: Follows one step commands with increased time Safety/Judgement: Decreased awareness of safety;Decreased awareness of deficits Awareness: Emergent Problem Solving: Slow processing;Difficulty sequencing;Requires verbal cues;Requires tactile cues General Comments: pt remains to have L sided inattention    Balance  Balance Balance Assessed: Yes Static Sitting Balance Static Sitting - Balance Support: Feet supported Static Sitting - Level of Assistance: 4: Min assist Static Sitting - Comment/# of Minutes: pt with slight R lateral lean, v/c's to  self correct Static Standing Balance Static Standing - Balance Support: Bilateral upper extremity supported Static Standing - Level of Assistance:  1: +1 Total assist Static Standing - Comment/# of Minutes: pt with strong retropulsion, max tactile and verbal cues to obtain midline position. worked on mini squats however pt with strong retropulsion and unable to bring weight forward despite maximal verbal and tactile cues.  End of Session PT - End of Session Equipment Utilized During Treatment: Gait belt Activity Tolerance: Patient limited by lethargy Patient left: in bed;with call bell/phone within reach Nurse Communication: Mobility status   GP     Marcene Brawn 05/03/2013, 11:16 AM  Lewis Shock, PT, DPT Pager #: [phone] Office #: [phone]

## 2013-05-03 NOTE — Clinical Social Work Note (Signed)
Clinical Social Worker continuing to follow patient and family for support and discharge planning needs.  PT/OT continue to recommend inpatient rehab at discharge. CSW has initiated SNF search and provided patient family with bed offers as alternative discharge plan.  CSW spoke with inpatient rehab admissions coordinator who states she has initiated insurance authorization for inpatient rehab.  CSW will have to await [employer] denial prior to pursuing SNF authorization.  CSW will continue to remain available for support and to facilitate patient discharge needs once medically stable.   Macario Golds, [place]  [phone]

## 2013-05-03 NOTE — Progress Notes (Signed)
NUTRITION FOLLOW UP  DOCUMENTATION CODES  Per approved criteria   -Severe malnutrition in the context of acute illness or injury    Intervention:    1. Increase Ensure Complete po to BID, each supplement provides 350 kcal and 13 grams of protein, once diet advanced.   Nutrition Dx:   Inadequate oral intake related to decreased appetite as evidenced by Meal Completion: 50%; resolved.   Increased nutrient needs related to surgery as evidenced by estimated needs.   Goal:  Pt to meet >/= 90% of their estimated nutrition needs, met.   Monitor:  PO & supplemental intake, weight, labs, I/O's  Assessment:   Patient with PMH of chronic headaches presented to ER because of incoordination; CT-angio done last week which showed a dista carotid aneurysm; CT head in ER showed SAH most likely traumatic with some fluid in the cyst.  Pt is s/p pipeline embolization RICA aneurysm 12/1.  Pt eating lunch, dad at bedside. Pt has been eating > 75% of her meals and loves her ensure. Encouragement provided. Pt currently not tearful as she has been in the past.  Per RN pt needs supervision while eating due to potential pocketing.   Height: Ht Readings from Last 1 Encounters:  04/18/13 5\' 4"  (1.626 m)    Weight Status:   Wt Readings from Last 1 Encounters:  04/18/13 136 lb 9.6 oz (61.961 kg)  No new weight; pt in chair  Re-estimated needs:  Kcal: 1600-1800  Protein: 75-85 gm  Fluid: 1.6-1.8 L  Skin: no issues noted  Diet Order: Dysphagia 2 with Nectar Thick Liquids Meal Completion: 75-100%   Intake/Output Summary (Last 24 hours) at 05/03/13 1253 Last data filed at 05/03/13 1200  Gross per 24 hour  Intake   2660 ml  Output   1295 ml  Net   1365 ml    Last BM: 11/26   Labs:   Recent Labs Lab 05/02/13 1153  NA 135  K 4.1  CL 98  CO2 26  BUN 23  CREATININE 1.34*  CALCIUM 8.8  GLUCOSE 117*    CBG (last 3)  No results found for this basename: GLUCAP,  in the last 72  hours  Scheduled Meds: . aspirin  325 mg Oral Daily  . clopidogrel  75 mg Oral Q breakfast  . dexamethasone  4 mg Oral Q6H  . docusate sodium  100 mg Oral BID  . feeding supplement (ENSURE COMPLETE)  237 mL Oral BID BM  . lacosamide  50 mg Oral BID  . levETIRAcetam  1,000 mg Oral Q12H  . multivitamin with minerals  1 tablet Oral Daily  . pantoprazole  40 mg Oral Daily  . PARoxetine  50 mg Oral Daily    Continuous Infusions: . sodium chloride 75 mL/hr at 05/03/13 [address], LDN, CNSC [phone] Pager [phone] After Hours Pager

## 2013-05-04 ENCOUNTER — Encounter (HOSPITAL_COMMUNITY): Payer: Self-pay | Admitting: Neurosurgery

## 2013-05-04 MED ORDER — DEXAMETHASONE 4 MG PO TABS
4.0000 mg | ORAL_TABLET | Freq: Two times a day (BID) | ORAL | Status: DC
  Administered 2013-05-04 – 2013-05-05 (×2): 4 mg via ORAL
  Filled 2013-05-04 (×3): qty 1

## 2013-05-04 NOTE — Progress Notes (Signed)
OT Cancellation Note  Patient Details Name: [name] MRN: [id number] DOB: Jul 22, 1960   Cancelled Treatment:    Reason Eval/Treat Not Completed: Other (comment) Attempted to see pt. Pt being transferred to 4N. Will see in am [facility], OTR/L  [phone] 05/04/2013 05/04/2013, 5:40 PM

## 2013-05-04 NOTE — Progress Notes (Signed)
Physical Therapy Treatment Patient Details Name: [name] MRN: [id number] DOB: [birth year] Today's Date: 05/04/2013 Time: 4098-1191 PT Time Calculation (min): 30 min  PT Assessment / Plan / Recommendation  History of Present Illness HPI: patient taken to the emergency room yesterday because incoordination, falls up to the point that according to her husband she is getting more sleepy. 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 Ct head in the er shows sah most likely traumatic with some fluisd in the cyst.   PT Comments   Pt with significant improvement in participation in therapy and ability to maintain standing with midline posture. Pt with improved ability to advance L LE however remains to have L UE flaccidty. Pt con't to be appropriate for CIR upon d/c for maximal functional recovery.  Follow Up Recommendations  CIR     Does the patient have the potential to tolerate intense rehabilitation     Barriers to Discharge        Equipment Recommendations       Recommendations for Other Services    Frequency Min 4X/week   Progress towards PT Goals Progress towards PT goals: Progressing toward goals  Plan Current plan remains appropriate;Frequency needs to be updated    Precautions / Restrictions Precautions Precautions: Fall Restrictions Weight Bearing Restrictions: No   Pertinent Vitals/Pain Denies pain    Mobility  Bed Mobility Bed Mobility: Supine to Sit Supine to Sit: 4: Min assist;With rails;HOB elevated Details for Bed Mobility Assistance: pt unable to use L UE functionally but able to bring LE off EOB Ambulation/Gait Ambulation/Gait Assistance: 1: +2 Total assist Ambulation/Gait: Patient Percentage: 50% Ambulation Distance (Feet):  (5 side steps along side of bed then pivoted to chair) Assistive device:  (2 person assist) Ambulation/Gait Assistance Details: max directional v/c's, assist to advance L LE however able to initiate. maxA for lateral  weight-shift to be able to advance LEs Gait Pattern: Step-to pattern Modified Rankin (Stroke Patients Only) Pre-Morbid Rankin Score: Moderate disability Modified Rankin: Severe disability    Exercises Other Exercises Other Exercises: worked on marching in place with L LE, modA to lift L LE, completed 10 squats with +2 assist   PT Diagnosis:    PT Problem List:   PT Treatment Interventions:     PT Goals (current goals can now be found in the care plan section)    Visit Information  Last PT Received On: 05/04/13 Assistance Needed: +2 History of Present Illness: HPI: patient taken to the emergency room yesterday because incoordination, falls up to the point that according to her husband she is getting more sleepy. 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 Ct head in the er shows sah most likely traumatic with some fluisd in the cyst.    Subjective Data  Subjective: pt less emotional this date   Cognition  Cognition Arousal/Alertness: Awake/alert Behavior During Therapy: WFL for tasks assessed/performed Overall Cognitive Status: Impaired/Different from baseline Area of Impairment: Problem solving Problem Solving: Slow processing;Requires verbal cues;Requires tactile cues General Comments: Pt con't to have L sided inattention, increased time to process however improving    Balance  Static Sitting Balance Static Sitting - Balance Support: Feet supported Static Sitting - Level of Assistance: 4: Min assist  End of Session PT - End of Session Equipment Utilized During Treatment: Gait belt Activity Tolerance: Patient limited by lethargy Patient left: in bed;with call bell/phone within reach Nurse Communication: Mobility status   GP     Frederica Chrestman,  Becky Sax 05/04/2013, 4:52 PM  Lewis Shock, PT, DPT Pager #: [phone] Office #: [phone]

## 2013-05-04 NOTE — Progress Notes (Signed)
Patient ID: [name], female   DOB: 14-Mar-1961, 52 y.o.   MRN: [id number] Stable. Ok to go to rehabilitation as per dr Lottie Rater

## 2013-05-04 NOTE — Progress Notes (Signed)
Pt seen and examined. No issues overnight. Pt reports feeling well.  EXAM: Temp:  [97.9 F (36.6 C)-98.3 F (36.8 C)] 97.9 F (36.6 C) (12/03 0400) Pulse Rate:  [72-88] 83 (12/03 0900) Resp:  [10-20] 17 (12/03 0900) BP: (98-129)/(54-92) 118/74 mmHg (12/03 0900) SpO2:  [94 %-99 %] 97 % (12/03 0900) Intake/Output     12/02 0701 - 12/03 0700 12/03 0701 - 12/04 0700   P.O. 1160    I.V. (mL/kg) 1725 (27.8) 150 (2.4)   Total Intake(mL/kg) 2885 (46.6) 150 (2.4)   Urine (mL/kg/hr) 700 (0.5)    Blood     Total Output 700     Net +2185 +150         Awake, alert, oriented Speech slurred, appropriate 2-3/5 LUE/, 3-4/5 LLE Good strength RUE/RLE Groin c/d/i  LABS: Lab Results  Component Value Date   CREATININE 1.34* 05/02/2013   BUN 23 05/02/2013   NA 135 05/02/2013   K 4.1 05/02/2013   CL 98 05/02/2013   CO2 26 05/02/2013   Lab Results  Component Value Date   WBC 4.8 05/02/2013   HGB 11.2* 05/02/2013   HCT 32.2* 05/02/2013   MCV 90.7 05/02/2013   PLT 160 05/02/2013    IMPRESSION: - 52 y.o. female s/p Pipeline embolization RICA aneurysm, doing well  PLAN: - Transfer to CIR or SNF when available. - Cont daily ASA 325mg /Plavix 75mg  - Check dilantin level tomorrow, restart PHT when level is in therapeutic range. Will d/c on PHT 200mg  Daily, Keppra and Vimpat at current doses.

## 2013-05-04 NOTE — Progress Notes (Signed)
Report received from [facility]) in 3100.

## 2013-05-05 ENCOUNTER — Inpatient Hospital Stay (HOSPITAL_COMMUNITY)
Admission: RE | Admit: 2013-05-05 | Discharge: 2013-05-20 | DRG: 945 | Disposition: A | Discharge status: Skilled Nursing Facility | Payer: PRIVATE HEALTH INSURANCE | Source: Intra-hospital | Attending: Physical Medicine & Rehabilitation | Admitting: Physical Medicine & Rehabilitation

## 2013-05-05 DIAGNOSIS — G8110 Spastic hemiplegia affecting unspecified side: Secondary | ICD-10-CM | POA: Diagnosis present

## 2013-05-05 DIAGNOSIS — I671 Cerebral aneurysm, nonruptured: Secondary | ICD-10-CM | POA: Diagnosis present

## 2013-05-05 DIAGNOSIS — A498 Other bacterial infections of unspecified site: Secondary | ICD-10-CM | POA: Diagnosis present

## 2013-05-05 DIAGNOSIS — W19XXXA Unspecified fall, initial encounter: Secondary | ICD-10-CM | POA: Diagnosis present

## 2013-05-05 DIAGNOSIS — R209 Unspecified disturbances of skin sensation: Secondary | ICD-10-CM | POA: Diagnosis present

## 2013-05-05 DIAGNOSIS — R259 Unspecified abnormal involuntary movements: Secondary | ICD-10-CM | POA: Diagnosis present

## 2013-05-05 DIAGNOSIS — K5900 Constipation, unspecified: Secondary | ICD-10-CM | POA: Diagnosis not present

## 2013-05-05 DIAGNOSIS — K922 Gastrointestinal hemorrhage, unspecified: Secondary | ICD-10-CM | POA: Diagnosis not present

## 2013-05-05 DIAGNOSIS — R569 Unspecified convulsions: Secondary | ICD-10-CM | POA: Diagnosis present

## 2013-05-05 DIAGNOSIS — S066XAA Traumatic subarachnoid hemorrhage with loss of consciousness status unknown, initial encounter: Secondary | ICD-10-CM | POA: Diagnosis present

## 2013-05-05 DIAGNOSIS — I609 Nontraumatic subarachnoid hemorrhage, unspecified: Secondary | ICD-10-CM

## 2013-05-05 DIAGNOSIS — F411 Generalized anxiety disorder: Secondary | ICD-10-CM | POA: Diagnosis present

## 2013-05-05 DIAGNOSIS — N390 Urinary tract infection, site not specified: Secondary | ICD-10-CM | POA: Diagnosis present

## 2013-05-05 DIAGNOSIS — Z5189 Encounter for other specified aftercare: Principal | ICD-10-CM

## 2013-05-05 DIAGNOSIS — R471 Dysarthria and anarthria: Secondary | ICD-10-CM | POA: Diagnosis present

## 2013-05-05 DIAGNOSIS — N289 Disorder of kidney and ureter, unspecified: Secondary | ICD-10-CM | POA: Diagnosis present

## 2013-05-05 DIAGNOSIS — S066X9A Traumatic subarachnoid hemorrhage with loss of consciousness of unspecified duration, initial encounter: Secondary | ICD-10-CM | POA: Diagnosis present

## 2013-05-05 DIAGNOSIS — Z8673 Personal history of transient ischemic attack (TIA), and cerebral infarction without residual deficits: Secondary | ICD-10-CM

## 2013-05-05 DIAGNOSIS — R1310 Dysphagia, unspecified: Secondary | ICD-10-CM | POA: Diagnosis present

## 2013-05-05 MED ORDER — ONDANSETRON HCL 4 MG/2ML IJ SOLN: 4.0000 mg | Freq: Four times a day (QID) | INTRAMUSCULAR | Status: DC | PRN

## 2013-05-05 MED ORDER — LEVETIRACETAM 500 MG PO TABS
1000.0000 mg | ORAL_TABLET | Freq: Two times a day (BID) | ORAL | Status: DC
  Administered 2013-05-05 – 2013-05-20 (×30): 1000 mg via ORAL
  Filled 2013-05-05 (×33): qty 2

## 2013-05-05 MED ORDER — LORAZEPAM 1 MG PO TABS
1.0000 mg | ORAL_TABLET | Freq: Four times a day (QID) | ORAL | Status: DC | PRN
  Administered 2013-05-07 – 2013-05-11 (×3): 1 mg via ORAL
  Filled 2013-05-05 (×3): qty 1

## 2013-05-05 MED ORDER — CLOPIDOGREL BISULFATE 75 MG PO TABS
75.0000 mg | ORAL_TABLET | Freq: Every day | ORAL | Status: DC
  Administered 2013-05-06 – 2013-05-20 (×15): 75 mg via ORAL
  Filled 2013-05-05 (×17): qty 1

## 2013-05-05 MED ORDER — ACETAMINOPHEN 325 MG PO TABS
650.0000 mg | ORAL_TABLET | ORAL | Status: DC | PRN
  Administered 2013-05-09 – 2013-05-19 (×12): 650 mg via ORAL
  Filled 2013-05-05 (×12): qty 2

## 2013-05-05 MED ORDER — DEXAMETHASONE 4 MG PO TABS
4.0000 mg | ORAL_TABLET | Freq: Two times a day (BID) | ORAL | Status: DC
  Administered 2013-05-05 – 2013-05-10 (×10): 4 mg via ORAL
  Filled 2013-05-05 (×12): qty 1

## 2013-05-05 MED ORDER — SENNOSIDES-DOCUSATE SODIUM 8.6-50 MG PO TABS
2.0000 | ORAL_TABLET | Freq: Every evening | ORAL | Status: DC | PRN
  Administered 2013-05-12: 2 via ORAL
  Filled 2013-05-05: qty 2

## 2013-05-05 MED ORDER — ASPIRIN 325 MG PO TABS
325.0000 mg | ORAL_TABLET | Freq: Every day | ORAL | Status: DC
  Administered 2013-05-06 – 2013-05-17 (×12): 325 mg via ORAL
  Filled 2013-05-05 (×15): qty 1

## 2013-05-05 MED ORDER — SODIUM CHLORIDE 0.9 % IV SOLN
INTRAVENOUS | Status: DC
  Administered 2013-05-05 – 2013-05-06 (×2): via INTRAVENOUS
  Administered 2013-05-07: 75 mL/h via INTRAVENOUS
  Administered 2013-05-07 – 2013-05-08 (×2): via INTRAVENOUS
  Administered 2013-05-08: 75 mL/h via INTRAVENOUS
  Administered 2013-05-09 – 2013-05-13 (×3): via INTRAVENOUS
  Administered 2013-05-14: 75 mL/h via INTRAVENOUS
  Administered 2013-05-15 (×2): via INTRAVENOUS

## 2013-05-05 MED ORDER — ENSURE COMPLETE PO LIQD
237.0000 mL | Freq: Two times a day (BID) | ORAL | Status: DC
  Administered 2013-05-06 – 2013-05-19 (×20): 237 mL via ORAL

## 2013-05-05 MED ORDER — ADULT MULTIVITAMIN W/MINERALS CH
1.0000 | ORAL_TABLET | Freq: Every day | ORAL | Status: DC
  Administered 2013-05-06 – 2013-05-20 (×14): 1 via ORAL
  Filled 2013-05-05 (×17): qty 1

## 2013-05-05 MED ORDER — SORBITOL 70 % SOLN
30.0000 mL | Freq: Every day | Status: DC | PRN
  Administered 2013-05-13 – 2013-05-19 (×2): 30 mL via ORAL
  Filled 2013-05-05 (×2): qty 30

## 2013-05-05 MED ORDER — PHENYTOIN SODIUM EXTENDED 100 MG PO CAPS
200.0000 mg | ORAL_CAPSULE | Freq: Every day | ORAL | Status: DC
  Administered 2013-05-05: 200 mg via ORAL
  Filled 2013-05-05: qty 2

## 2013-05-05 MED ORDER — ONDANSETRON HCL 4 MG PO TABS: 4.0000 mg | ORAL_TABLET | Freq: Four times a day (QID) | ORAL | Status: DC | PRN

## 2013-05-05 MED ORDER — PANTOPRAZOLE SODIUM 40 MG PO TBEC
40.0000 mg | DELAYED_RELEASE_TABLET | Freq: Every day | ORAL | Status: DC
  Administered 2013-05-06 – 2013-05-20 (×14): 40 mg via ORAL
  Filled 2013-05-05 (×13): qty 1

## 2013-05-05 MED ORDER — RESOURCE THICKENUP CLEAR PO POWD
ORAL | Status: DC | PRN
  Filled 2013-05-05 (×2): qty 125

## 2013-05-05 MED ORDER — LACOSAMIDE 50 MG PO TABS
50.0000 mg | ORAL_TABLET | Freq: Two times a day (BID) | ORAL | Status: DC
  Administered 2013-05-05 – 2013-05-20 (×30): 50 mg via ORAL
  Filled 2013-05-05 (×31): qty 1

## 2013-05-05 MED ORDER — DOCUSATE SODIUM 100 MG PO CAPS
100.0000 mg | ORAL_CAPSULE | Freq: Two times a day (BID) | ORAL | Status: DC
  Administered 2013-05-05 – 2013-05-19 (×28): 100 mg via ORAL
  Filled 2013-05-05 (×34): qty 1

## 2013-05-05 MED ORDER — PAROXETINE HCL ER 25 MG PO TB24
50.0000 mg | ORAL_TABLET | Freq: Every day | ORAL | Status: DC
  Administered 2013-05-06 – 2013-05-11 (×6): 50 mg via ORAL
  Filled 2013-05-05 (×7): qty 2

## 2013-05-05 NOTE — Progress Notes (Signed)
NURSING PROGRESS NOTE  [name] [id number] Discharge Data: 05/05/2013 4:00 PM Attending Provider: Ranelle Oyster, MD NWG:[id number],HQIONG Alvino Chapel, MD     Tyrell Antonio to be D/C'd Rehab per MD order.  Discussed with the patient the After Visit Summary and all questions fully answered. All IV's discontinued with no bleeding noted. All belongings returned to patient for patient to take home.   Last Vital Signs:  Blood pressure 123/68, pulse 80, temperature 97.7 F (36.5 C), temperature source Oral, height 5\' 4"  (1.626 m), weight 72.53 kg (159 lb 14.4 oz), last menstrual period 08/26/2010, SpO2 100.00%.  Discharge Medication List   Medication List    ASK your doctor about these medications       clonazePAM 0.5 MG tablet  Commonly known as:  KLONOPIN  Take 1 tablet (0.5 mg total) by mouth 2 (two) times daily as needed for anxiety. For anxiety     diazepam 5 MG tablet  Commonly known as:  VALIUM  Take 1-2 tabs PO, 30 minutes prior to MRI     FLUoxetine 20 MG tablet  Commonly known as:  PROZAC  Take 30 mg by mouth 2 (two) times daily.     levETIRAcetam 500 MG tablet  Commonly known as:  KEPPRA  Take 1 tablet (500 mg total) by mouth every 12 (twelve) hours.     multivitamin tablet  Take 1 tablet by mouth daily.     oxyCODONE 5 MG immediate release tablet  Commonly known as:  Oxy IR/ROXICODONE  Take 1 tablet (5 mg total) by mouth every 8 (eight) hours as needed for pain. 1-2 tabs po q 4 hr prn pain     phenytoin 100 MG ER capsule  Commonly known as:  DILANTIN  Take 2 capsules (200 mg total) by mouth 2 (two) times daily.     prochlorperazine 10 MG tablet  Commonly known as:  COMPAZINE  Take 10 mg by mouth every 6 (six) hours as needed. For nausea     traMADol 50 MG tablet  Commonly known as:  ULTRAM  Take 1 tablet (50 mg total) by mouth every 6 (six) hours as needed. Maximum dose= 8 tablets per day     zolpidem 10 MG tablet  Commonly known as:  AMBIEN  Take 10 mg by  mouth at bedtime as needed for sleep.
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
## 2013-05-05 NOTE — Progress Notes (Signed)
Patient ID: [name], female   DOB: [birth year], 52 y.o.   MRN: [id number] Patient admitted to [phone] via bed, escorted by nursing staff and family.  Patient verbalized understanding of rehab process, no questions asked.  Existing IV removed, it was poorly secured and was bleeding.  Patient voided upon arrival using bedpan.  VSS.  Appears to be in no immediate distress at this time.  Signed fall safety plan. Dani Gobble, RN

## 2013-05-05 NOTE — Progress Notes (Signed)
Rehab admissions - I have authorization from [facility] for acute inpatient rehab admission for today.  Bed available and can admit to acute inpatient rehab today.  Call me for questions.  #[phone]

## 2013-05-05 NOTE — Progress Notes (Signed)
Physical Therapy Treatment Patient Details Name: LIBERTIE HAUSLER MRN: [id number] DOB: 1961/02/18 Today's Date: 05/05/2013 Time: 7829-5621 PT Time Calculation (min): 23 min  PT Assessment / Plan / Recommendation  History of Present Illness HPI: patient taken to the emergency room yesterday because incoordination, falls up to the point that according to her husband she is getting more sleepy. 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 Ct head in the er shows sah most likely traumatic with some fluisd in the cyst.   PT Comments   Pt progressing slowly today due to lethargy from anxiety medication.  Pt has been lethargic since at least 10am per pt's father and has not been very alert.  LUE continues to be flaccid while LLE appears to continue with improving. Pt and family educated on benefit of rehab and continued therapy in CIR. Father educated of importance of being on pt's Left side to motivate L side use.  Pt planning to move to CIR today per pt's father.  Follow Up Recommendations  CIR     Does the patient have the potential to tolerate intense rehabilitation     Barriers to Discharge        Equipment Recommendations       Recommendations for Other Services    Frequency Min 4X/week   Progress towards PT Goals Progress towards PT goals: Progressing toward goals  Plan Current plan remains appropriate    Precautions / Restrictions Precautions Precautions: Fall Precaution Comments: R UE hemiplegia Restrictions Weight Bearing Restrictions: No   Pertinent Vitals/Pain no apparent distress     Mobility  Bed Mobility Bed Mobility: Supine to Sit;Sitting - Scoot to Edge of Bed Supine to Sit: 4: Min assist;With rails;HOB elevated Sitting - Scoot to Edge of Bed: 3: Mod assist Details for Bed Mobility Assistance: vc's for sequencing and technique; increased time to respond. Assist to come to full sitting Transfers Transfers: Sit to Stand;Stand to Sit Sit to Stand: 1: +2  Total assist;With upper extremity assist;From bed Sit to Stand: Patient Percentage: 40% Stand to Sit: To bed;To chair/3-in-1;1: +2 Total assist;With upper extremity assist Stand to Sit: Patient Percentage: 40% Stand Pivot Transfers: 1: +2 Total assist Stand Pivot Transfers: Patient Percentage: 40% Details for Transfer Assistance: verbal and tactile cues for erect posture; vc's for hand placement Ambulation/Gait Ambulation/Gait Assistance: Not tested (comment) (pt lethargic and not as participatory today)    Exercises     PT Diagnosis:    PT Problem List:   PT Treatment Interventions:     PT Goals (current goals can now be found in the care plan section)    Visit Information  Last PT Received On: 05/05/13 Assistance Needed: +2 History of Present Illness: HPI: patient taken to the emergency room yesterday because incoordination, falls up to the point that according to her husband she is getting more sleepy. 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 Ct head in the er shows sah most likely traumatic with some fluisd in the cyst.    Subjective Data      Cognition  Cognition Arousal/Alertness: Lethargic;Suspect due to medications Behavior During Therapy: Flat affect Overall Cognitive Status: Impaired/Different from baseline Area of Impairment: Problem solving;Following commands Following Commands: Follows one step commands with increased time Problem Solving: Difficulty sequencing;Requires verbal cues;Slow processing General Comments: A&Ox4; L sided inattention; increased time to process    Balance     End of Session PT - End of Session Equipment Utilized During Treatment: Gait  belt Activity Tolerance: Patient limited by lethargy Patient left: in chair;with call bell/phone within reach;with family/visitor present Nurse Communication: Mobility status (RN detached IV prior to tx; explained reason for lethary)   GP     Ernestina Columbia, SPTA  05/05/2013, 12:46  PM

## 2013-05-05 NOTE — Progress Notes (Signed)
No issues overnight. Pt reports feeling well, no changes.  EXAM:  BP 128/82  Pulse 80  Temp(Src) 98.4 F (36.9 C) (Oral)  Resp 16  Ht 5\' 4"  (1.626 m)  Wt 61.961 kg (136 lb 9.6 oz)  BMI 23.44 kg/m2  SpO2 80%  LMP 08/26/2010  Awake, alert, oriented  Speech fluent, appropriate, slurred  Left facial stable, Stable left hemiparesis  IMPRESSION:  52 y.o. 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 - Cont ASA/Plavix - Restart PHT 200mg  Daily today, cont Keppra, Vimpat
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
## 2013-05-05 NOTE — Progress Notes (Signed)
Occupational Therapy Treatment Patient Details Name: [name] MRN: [id number] DOB: 1961-03-19 Today's Date: 05/05/2013 Time: 4098-1191 OT Time Calculation (min): 51 min  OT Assessment / Plan / Recommendation  History of present illness HPI: patient taken to the emergency room because incoordination, falls up to the point that according to her husband she is getting more sleepy. 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 Ct head in the er shows sah most likely traumatic with some fluisd in the cyst.   OT comments  Pt with excellent participation with improvement in ADL, toilet transfers and palpable elbow extensors and forearm pronators.  Reports sensation continues to improve. In good spirits and very grateful for assistance.  Follow Up Recommendations  CIR    Barriers to Discharge       Equipment Recommendations       Recommendations for Other Services    Frequency Min 3X/week   Progress towards OT Goals Progress towards OT goals: Progressing toward goals  Plan Discharge plan remains appropriate    Precautions / Restrictions Precautions Precautions: Fall Precaution Comments: R UE hemiplegia Restrictions Weight Bearing Restrictions: No   Pertinent Vitals/Pain VSS, no pain    ADL  Grooming: Brushing hair;Wash/dry face;Set up Where Assessed - Grooming: Unsupported sitting Upper Body Bathing: Moderate assistance Where Assessed - Upper Body Bathing: Unsupported sitting Lower Body Bathing: +1 Total assistance Where Assessed - Lower Body Bathing: Unsupported sitting;Supported sit to stand Upper Body Dressing: Maximal assistance Where Assessed - Upper Body Dressing: Unsupported sitting Toilet Transfer: +2 Total assistance Toilet Transfer: Patient Percentage: 60% Toilet Transfer Method: Stand pivot Toilet Transfer Equipment: Bedside commode Toileting - Clothing Manipulation and Hygiene: +1 Total assistance Where Assessed - Glass blower/designer Manipulation  and Hygiene: Standing Equipment Used: Gait belt ADL Comments: Pt in good spirits.  Excellent participation in toileting, bathing, dressing and grooming.    OT Diagnosis:    OT Problem List:   OT Treatment Interventions:     OT Goals(current goals can now be found in the care plan section) Acute Rehab OT Goals Patient Stated Goal: to go home  Visit Information  Last OT Received On: 05/05/13 Assistance Needed: +2 History of Present Illness: HPI: patient taken to the emergency room yesterday because incoordination, falls up to the point that according to her husband she is getting more sleepy. 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 Ct head in the er shows sah most likely traumatic with some fluisd in the cyst.    Subjective Data      Prior Functioning       Cognition  Cognition Arousal/Alertness: Awake/alert Behavior During Therapy: WFL for tasks assessed/performed Overall Cognitive Status: Impaired/Different from baseline Area of Impairment: Problem solving;Following commands;Safety/judgement Following Commands: Follows one step commands with increased time Safety/Judgement: Decreased awareness of safety;Decreased awareness of deficits Problem Solving: Difficulty sequencing;Requires verbal cues;Slow processing General Comments: some perseveration when washing, applying deodorant, brushing teeth, required verbal redirection.    Mobility  Bed Mobility Bed Mobility: Not assessed Supine to Sit: 4: Min assist;With rails;HOB elevated Sitting - Scoot to Edge of Bed: 3: Mod assist Details for Bed Mobility Assistance: vc's for sequencing and technique; increased time to respond. Assist to come to full sitting Transfers Transfers: Sit to Stand;Stand to Sit Sit to Stand: 1: +2 Total assist;With upper extremity assist;From chair/3-in-1 Sit to Stand: Patient Percentage: 60% Stand to Sit: 1: +2 Total assist;With upper extremity assist;To chair/3-in-1 Stand to Sit:  Patient Percentage: 60% Details  for Transfer Assistance: verbal and tactile cues for erect posture; vc's for hand placement    Exercises      Balance Balance Balance Assessed: Yes Static Sitting Balance Static Sitting - Balance Support: Feet supported Static Sitting - Level of Assistance: 5: Stand by assistance Static Sitting - Comment/# of Minutes: 25 Static Standing Balance Static Standing - Balance Support: Bilateral upper extremity supported Static Standing - Level of Assistance: 3: Mod assist Static Standing - Comment/# of Minutes: 3   End of Session OT - End of Session Activity Tolerance: Patient tolerated treatment well Patient left: in chair;with call bell/phone within reach;with family/visitor present Nurse Communication: Mobility status  GO     Evern Bio 05/05/2013, 1:42 PM [phone]

## 2013-05-05 NOTE — Progress Notes (Signed)
Seen and agreed 06/25/2012 Robinette, Julia Elizabeth PTA [phone] pager [phone] office      

## 2013-05-06 ENCOUNTER — Inpatient Hospital Stay (HOSPITAL_COMMUNITY): Payer: PRIVATE HEALTH INSURANCE

## 2013-05-06 ENCOUNTER — Inpatient Hospital Stay (HOSPITAL_COMMUNITY): Payer: PRIVATE HEALTH INSURANCE | Admitting: Occupational Therapy

## 2013-05-06 ENCOUNTER — Inpatient Hospital Stay (HOSPITAL_COMMUNITY): Payer: PRIVATE HEALTH INSURANCE | Admitting: Speech Pathology

## 2013-05-06 DIAGNOSIS — I609 Nontraumatic subarachnoid hemorrhage, unspecified: Secondary | ICD-10-CM

## 2013-05-06 LAB — CBC WITH DIFFERENTIAL/PLATELET
Basophils Relative: 0 % (ref 0–1)
Eosinophils Absolute: 0 10*3/uL (ref 0.0–0.7)
MCH: 31.5 pg (ref 26.0–34.0)
MCHC: 34.8 g/dL (ref 30.0–36.0)
Neutro Abs: 2.9 10*3/uL (ref 1.7–7.7)
Neutrophils Relative %: 74 % (ref 43–77)
Platelets: 116 10*3/uL — ABNORMAL LOW (ref 150–400)
RBC: 3.08 MIL/uL — ABNORMAL LOW (ref 3.87–5.11)
RDW: 14.3 % (ref 11.5–15.5)

## 2013-05-06 LAB — COMPREHENSIVE METABOLIC PANEL
ALT: 17 U/L (ref 0–35)
Albumin: 2.3 g/dL — ABNORMAL LOW (ref 3.5–5.2)
Alkaline Phosphatase: 87 U/L (ref 39–117)
Chloride: 102 mEq/L (ref 96–112)
Creatinine, Ser: 0.85 mg/dL (ref 0.50–1.10)
GFR calc non Af Amer: 76 mL/min — ABNORMAL LOW (ref >90)
Potassium: 4.2 mEq/L (ref 3.5–5.1)
Sodium: 134 mEq/L — ABNORMAL LOW (ref 135–145)
Total Protein: 4.9 g/dL — ABNORMAL LOW (ref 6.0–8.3)

## 2013-05-06 LAB — PHENYTOIN LEVEL, TOTAL: Phenytoin Lvl: 11.8 ug/mL (ref 10.0–20.0)

## 2013-05-06 MED ORDER — PHENYTOIN 50 MG PO CHEW
150.0000 mg | CHEWABLE_TABLET | Freq: Every day | ORAL | Status: DC
  Administered 2013-05-06 – 2013-05-13 (×8): 150 mg via ORAL
  Filled 2013-05-06 (×9): qty 3

## 2013-05-06 NOTE — Progress Notes (Signed)
Patient information reviewed and entered into [facility] system by Reesha Debes, RN, CRRN, PPS Coordinator.  Information including medical coding and functional independence measure will be reviewed and updated through discharge.    
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
## 2013-05-06 NOTE — Progress Notes (Signed)
Subjective/Complaints: Had a reasonable night. Has tingling in her left arm and leg. Not painful. Denies headache A 12 point review of systems has been performed and if not noted above is otherwise negative.   Objective: Vital Signs: Blood pressure 152/87, pulse 79, temperature 97.3 F (36.3 C), temperature source Oral, resp. rate 18, height 5\' 4"  (1.626 m), weight 72.53 kg (159 lb 14.4 oz), last menstrual period 08/26/2010, SpO2 99.00%. No results found.  Recent Labs  05/06/13 0535  WBC 3.9*  HGB 9.7*  HCT 27.9*  PLT 116*    Recent Labs  05/06/13 0535  NA 134*  K 4.2  CL 102  GLUCOSE 97  BUN 19  CREATININE 0.85  CALCIUM 8.6   CBG (last 3)  No results found for this basename: GLUCAP,  in the last 72 hours  Wt Readings from Last 3 Encounters:  05/05/13 72.53 kg (159 lb 14.4 oz)  04/18/13 61.961 kg (136 lb 9.6 oz)  04/18/13 61.961 kg (136 lb 9.6 oz)    Physical Exam:  Vitals reviewed.  Eyes:  Pupils reactive to light  Neck: Normal range of motion. Neck supple. No thyromegaly present.  Cardiovascular: Normal rate and regular rhythm. No murmurs Respiratory: Effort normal and breath sounds normal. No respiratory distress. No wheezes  GI: Soft. Bowel sounds are normal. She exhibits no distension.  Musculoskeletal: She exhibits no pain with shoulder range of motion , no calf or thigh tenderness to palpation or swelling   Neurological:  Patient is alert. She was able to provide her name and place. Speech was dysarthric but intelligible. Delayed processing. Reasonable insight and awareness  4 minus/5 in the right deltoid, bicep, tricep, grip, hip flexor, knee extensors, ankle dorsi flexion plantar flexor  Left upper extremity has 1+ pec, trace bicep, deltoid, tricep, 0 at hand Left lower extremity has 2 minus hip extension, 1 HAD, HAB, tr KE, 0 distally.  Absent sensation to light touch in the left upper and left lower extremity. Normal sensation to light touch in the  right upper and right lower extremity. 1 to 1+ tone LUE and LLE. Left heel cord tight   Assessment/Plan: 1. 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 Physiatrist is providing close team supervision and 24 hour management of active medical problems listed below. Physiatrist and rehab team continue to assess barriers to discharge/monitor patient progress toward functional and medical goals. FIM:                   Comprehension Comprehension Mode: Auditory Comprehension: 3-Understands basic 50 - 74% of the time/requires cueing 25 - 50%  of the time  Expression Expression Mode: Verbal Expression: 4-Expresses basic 75 - 89% of the time/requires cueing 10 - 24% of the time. Needs helper to occlude trach/needs to repeat words.  Social Interaction Social Interaction: 4-Interacts appropriately 75 - 89% of the time - Needs redirection for appropriate language or to initiate interaction.  Problem Solving Problem Solving: 3-Solves basic 50 - 74% of the time/requires cueing 25 - 49% of the time  Memory Memory: 3-Recognizes or recalls 50 - 74% of the time/requires cueing 25 - 49% of the time   Medical Problem List and Plan:  1. 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 Status post type line embolization RICA aneurysm   -PRAFO LLE 2. DVT Prophylaxis/Anticoagulation: SCDs  3. Chronic headaches/Pain Management: continue prn tylenol.  4. Mood/Anxiety. Ativan 1 mg every 6 hours as needed. Paxil 50 mg daily  4. Neuropsych: This patient is not capable of making decisions on her own behalf.  5. Seizures: continue Keppra, and vimpat--dilantin to be resumed pending level 6. Dysphagia: Dysphagia 2 nectar liquids. Monitor for any signs of aspiration. Followup speech therapy  7. E. Coli UTI: follow  up culture 8. Acute renal insufficiency. Latest creatinine 0.85. May continue IVF for now pending fluid intake  LOS (Days) 1 A FACE TO FACE EVALUATION WAS PERFORMED  SWARTZ,ZACHARY T 05/06/2013 8:35 AM

## 2013-05-06 NOTE — Progress Notes (Signed)
Orthopedic Tech Progress Note Patient Details:  [name] [birth year] [id number]  Patient ID: [name], female   DOB: 10-Jul-1960, 52 y.o.   MRN: [id number]   Shawnie Pons 05/06/2013, 9:26 AMCalled advanced for left prafo boot.

## 2013-05-06 NOTE — Progress Notes (Addendum)
Occupational Therapy Note  Patient Details  Name: [name] MRN: [id number] Date of Birth: December 07, 1960 Today's Date: 05/06/2013  Time: 1300-1330 Pt denies pain Individual Therapy  Treatment initiated after conferring with evaluating OTR and goals discussed. Pt sitting in w/c eating lunch upon arrival.  Pt required extra time to complete self feeding requiring assistance to clear food out of left side of mouth.  Pt stated she could feel the food in her mouth but was unable to remove it with her tongue.  Pt engaged in sit<>stand from w/c with mod A.  Pt exhibited delayed response to following directions/commands.  Therapist performed LUE PROM/AAROM.  Pt able to perform shoulder elevation.  Trace biceps muscle activation noted.   Lavone Neri [facility] 05/06/2013, 2:21 PM
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
## 2013-05-06 NOTE — IPOC Note (Addendum)
Overall Plan of Care [facility]) Patient Details Name: [name] MRN: [id number] DOB: 31-Jul-1960  Admitting Diagnosis: [facility] Problems: Principal Problem:   Subarachnoid hemorrhage Active Problems:   SAH (subarachnoid hemorrhage)     Functional Problem List: Nursing Bladder;Bowel;Edema;Motor;Sensory;Skin Integrity  PT Balance;Endurance;Motor;Perception;Sensory;Skin Integrity;Safety;Other (comment) (strength)  OT Balance;Behavior;Pain;Perception;Cognition;Edema;Safety;Sensory;Endurance;Motor;Skin Integrity  SLP Cognition  TR         Basic ADL's: OT Eating;Grooming;Bathing;Dressing;Toileting     Advanced  ADL's: OT       Transfers: PT Bed Mobility;Bed to Chair;Car;Furniture  OT Toilet;Tub/Shower     Locomotion: PT Ambulation;Wheelchair Mobility;Other (comment)     Additional Impairments: OT Fuctional Use of Upper Extremity  SLP Swallowing;Communication;Social Cognition expression Social Interaction;Problem Solving;Memory;Attention;Awareness  TR      Anticipated Outcomes Item Anticipated Outcome  Self Feeding supervision  Swallowing  Supervision   Basic self-care  supervision  Toileting  supervision   Bathroom Transfers supervision  Bowel/Bladder  Continent of bowel and bladder- reported to have periods of incontinence  Transfers  Supervision-Min A  Locomotion  Supervision at w/c level, Min A at short distance ambulatory level  Communication  Supervision  Cognition  Supervision-Min A   Pain  < 3  Safety/Judgment      Therapy Plan: PT Intensity: Minimum of 1-2 x/day ,45 to 90 minutes PT Frequency: 5 out of 7 days PT Duration Estimated Length of Stay: 18-21 days OT Intensity: Minimum of 1-2 x/day, 45 to 90 minutes OT Frequency: 5 out of 7 days OT Duration/Estimated Length of Stay: 3 weeks SLP Intensity: Minumum of 1-2 x/day, 30 to 90 minutes SLP Frequency: 5 out of 7 days SLP Duration/Estimated Length of Stay: 3 weeks       Team  Interventions: Nursing Interventions Patient/Family Education;Bowel Management;Bladder Management;Skin Care/Wound Management;Dysphagia/Aspiration Precaution Training  PT interventions Ambulation/gait training;Balance/vestibular training;Cognitive remediation/compensation;Discharge planning;DME/adaptive equipment instruction;Neuromuscular re-education;Psychosocial support;Stair training;UE/LE Strength taining/ROM;Wheelchair propulsion/positioning;UE/LE Coordination activities;Therapeutic Activities;Skin care/wound management;Pain management;Disease management/prevention;Functional mobility training;Patient/family education;Splinting/orthotics;Therapeutic Exercise  OT Interventions Balance/vestibular training;Cognitive remediation/compensation;Community reintegration;Discharge planning;DME/adaptive equipment instruction;Disease mangement/prevention;Functional mobility training;Pain management;Neuromuscular re-education;Patient/family education;Self Care/advanced ADL retraining;Splinting/orthotics;Therapeutic Exercise;UE/LE Coordination activities;UE/LE Strength taining/ROM;Therapeutic Activities;Skin care/wound managment;Psychosocial support  SLP Interventions Cognitive remediation/compensation;Cueing hierarchy;Dysphagia/aspiration precaution training;Functional tasks;Patient/family education;Therapeutic Activities;Speech/Language facilitation;Internal/external aids;Oral motor exercises;Environmental controls  TR Interventions    SW/CM Interventions      Team Discharge Planning: Destination: PT-Home ,OT-  (home v SNF) , SLP- (TBD) Projected Follow-up: PT-Home health PT;24 hour supervision/assistance, OT-  24 hour supervision/assistance, SLP-24 hour supervision/assistance;Home Health SLP;Outpatient SLP Projected Equipment Needs: PT-To be determined, OT-  , SLP-None recommended by SLP Equipment Details: PT- , OT- to be determined Patient/family involved in discharge planning: PT- Patient;Family  member/caregiver,  OT-Family member/caregiver;Patient, SLP-Patient;Family member/caregiver  MD ELOS: 20 days Medical Rehab Prognosis:  Excellent Assessment: The patient has been admitted for CIR therapies. The team will be addressing, functional mobility, strength, stamina, balance, safety, adaptive techniques/equipment, self-care, bowel and bladder mgt, patient and caregiver education, spasticity mgt, NMR, cognition, communication . Goals have been set at supervision for basic mobility and self-care and supervision to min assist for cognition.    Ranelle Oyster, MD, FAAPMR      See Team Conference Notes for weekly updates to the plan of care
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
## 2013-05-07 ENCOUNTER — Inpatient Hospital Stay (HOSPITAL_COMMUNITY): Payer: PRIVATE HEALTH INSURANCE

## 2013-05-07 NOTE — Progress Notes (Signed)
Subjective/Complaints: Slept pretty well. Pain controlled. Wasn't wearing PRAFO when i arrived. A 12 point review of systems has been performed and if not noted above is otherwise negative.   Objective: Vital Signs: Blood pressure 140/74, pulse 80, temperature 98.6 F (37 C), temperature source Oral, resp. rate 17, height 5\' 4"  (1.626 m), weight 72.53 kg (159 lb 14.4 oz), last menstrual period 08/26/2010, SpO2 96.00%. No results found.  Recent Labs  05/06/13 0535  WBC 3.9*  HGB 9.7*  HCT 27.9*  PLT 116*    Recent Labs  05/06/13 0535  NA 134*  K 4.2  CL 102  GLUCOSE 97  BUN 19  CREATININE 0.85  CALCIUM 8.6   CBG (last 3)  No results found for this basename: GLUCAP,  in the last 72 hours  Wt Readings from Last 3 Encounters:  05/05/13 72.53 kg (159 lb 14.4 oz)  04/18/13 61.961 kg (136 lb 9.6 oz)  04/18/13 61.961 kg (136 lb 9.6 oz)    Physical Exam:  Vitals reviewed.  Eyes:  Pupils reactive to light  Neck: Normal range of motion. Neck supple. No thyromegaly present.  Cardiovascular: Normal rate and regular rhythm. No murmurs Respiratory: Effort normal and breath sounds normal. No respiratory distress. No wheezes  GI: Soft. Bowel sounds are normal. She exhibits no distension.  Musculoskeletal: She exhibits no pain with shoulder range of motion , no calf or thigh tenderness to palpation or swelling   Neurological:  Patient is alert. She was able to provide her name and place. Speech was dysarthric but intelligible. Delayed processing. fair insight and awareness  4 minus/5 in the right deltoid, bicep, tricep, grip, hip flexor, knee extensors, ankle dorsi flexion plantar flexor  Left upper extremity has 1+ pec, trace bicep, deltoid, tricep, 0 at hand Left lower extremity has 2 minus hip extension, 1 HAD, HAB, tr KE, 0 distally.  Absent sensation to light touch in the left upper and left lower extremity. Normal sensation to light touch in the right upper and right  lower extremity. 1 to 1+ tone LUE and LLE. Left heel cord tight   Assessment/Plan: 1. 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 Physiatrist is providing close team supervision and 24 hour management of active medical problems listed below. Physiatrist and rehab team continue to assess barriers to discharge/monitor patient progress toward functional and medical goals. FIM: FIM - Bathing Bathing Steps Patient Completed: Chest;Abdomen;Front perineal area Bathing: 2: Max-Patient completes 3-4 47f 10 parts or 25-49%  FIM - Upper Body Dressing/Undressing Upper body dressing/undressing steps patient completed: Put head through opening of pull over shirt/dress Upper body dressing/undressing: 2: Max-Patient completed 25-49% of tasks FIM - Lower Body Dressing/Undressing Lower body dressing/undressing: 1: Two helpers  FIM - Hotel manager Devices: Grab bar or rail for support Toileting: 2: Max-Patient completed 1 of 3 steps  FIM - Diplomatic Services operational officer Devices: Elevated toilet seat;Grab bars Toilet Transfers: 3-To toilet/BSC: Mod A (lift or lower assist);3-From toilet/BSC: Mod A (lift or lower assist)  FIM - Bed/Chair Transfer Bed/Chair Transfer Assistive Devices: Arm rests;HOB elevated;Bed rails Bed/Chair Transfer: 3: Supine > Sit: Mod A (lifting assist/Pt. 50-74%/lift 2 legs;3: Sit > Supine: Mod A (lifting assist/Pt. 50-74%/lift 2 legs);3: Bed > Chair or W/C: Mod A (lift or lower assist);3: Chair or W/C > Bed: Mod A (lift or lower assist)  FIM - Locomotion: Wheelchair  Distance: 75'; PTA pt able to propel w/c w/ B UE; able to propel this session w/ R UE/LE, attempted B LE but decreased proprioception/attention to L LE Locomotion: Wheelchair: 2: Travels 50 - 149 ft with moderate assistance (Pt: 50 -  74%)  Comprehension Comprehension Mode: Auditory Comprehension: 3-Understands basic 50 - 74% of the time/requires cueing 25 - 50%  of the time  Expression Expression Mode: Verbal Expression: 3-Expresses basic 50 - 74% of the time/requires cueing 25 - 50% of the time. Needs to repeat parts of sentences.  Social Interaction Social Interaction: 4-Interacts appropriately 75 - 89% of the time - Needs redirection for appropriate language or to initiate interaction.  Problem Solving Problem Solving: 3-Solves basic 50 - 74% of the time/requires cueing 25 - 49% of the time  Memory Memory: 3-Recognizes or recalls 50 - 74% of the time/requires cueing 25 - 49% of the time   Medical Problem List and Plan:  1. 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 Status post type line embolization RICA aneurysm   -PRAFO LLE 2. DVT Prophylaxis/Anticoagulation: SCDs  3. Chronic headaches/Pain Management: continue prn tylenol.  4. Mood/Anxiety. Ativan 1 mg every 6 hours as needed. Paxil 50 mg daily--continue to track  4. Neuropsych: This patient is not capable of making decisions on her own behalf.  5. Seizures: continue Keppra, and vimpat--dilantin to be resumed pending level 6. Dysphagia: Dysphagia 2 nectar liquids. Tolerating fairly well at present  7. E. Coli UTI: follow up culture 8. Acute renal insufficiency. Latest creatinine 0.85. May continue IVF for now pending fluid intake  -would like for PO to be at least 1000cc to stop IVF  LOS (Days) 2 A FACE TO FACE EVALUATION WAS PERFORMED  Lundyn Coste T 05/07/2013 8:55 AM

## 2013-05-07 NOTE — Progress Notes (Signed)
SLP Cancellation Note  Patient Details Name: REVONDA MENTER MRN: [id number] DOB: 15-Sep-1960   Cancelled treatment:        Patient missed 30 minutes of skilled co-treatment with SLP/OT targeting dysphagia and cognition goals due to not feeling well. Will continue current plan of care.    Maxcine Ham, M.A. CCC-SLP [phone]   Maxcine Ham 05/07/2013, 2:20 PM

## 2013-05-08 ENCOUNTER — Inpatient Hospital Stay (HOSPITAL_COMMUNITY): Payer: PRIVATE HEALTH INSURANCE | Admitting: *Deleted

## 2013-05-08 NOTE — Progress Notes (Signed)
Physical Therapy Session Note  Patient Details  Name: [name] MRN: [id number] Date of Birth: 1960/10/18  Today's Date: 05/08/2013 Time: 4098-1191 Time Calculation (min): 60 min   Skilled Therapeutic Interventions/Progress Updates:  Session I 0950-1050  60 min  Patient in w/c in her room, ready for therapy. W./c propulsion with R UE and LE to /back from the gym with min A and cues for path following, patient pulls to the left. Attempted gait training in II bars 2 x 3 feet with max A and manual progression of the L Foot.Gait with Hemi walker is not possible during today's session, patient is very afraid and buckles B Knees when asked to take a step. Transfer training to w/c >mat>.supine with max A and constant verbal cues.  In supine ;bridging 2 x 15, isometric contractures of gluts 2 x10 , SLR 2 x 10 , Knee flexion 2 x 10 , hip abd and add,2x10. Sit to stand 1/2 and all the way 2 x 10  Static and dynamic sitting activities EOM. Static standing training with manual weight shifting.  Patient back in room with quick realese belt on ,all needs within reach.  Session II 1430-1500  30 min   W/C propulsion training with mod A for path following on a distance of 150 feet with 2 rest breaks. Standing activity tolerance training : standing in front of the mirror in the frame (w/o standing belt ) performing rainbowe arch activities, Dynamic standing balance with standing in front of the mat and hanging horseshoes on the basketball rim , focus on weight shifting and weight acceptance on L side.  Patient returned to room , transfer to bed with max A and sit to supine max A, able to help with scooting up in the bed (min A only,however increased time needed to complete task). Patient did not complain of pain, at the end of the session started crying a little because there is too much going on for her at the nursing station and she does not want to go there.     Therapy  Documentation Precautions:  Precautions Precautions: Fall Precaution Comments: R UE hemiplegia, anxiety  Restrictions Weight Bearing Restrictions: No Pain: Pain Assessment Pain Assessment: No/denies pain  See FIM for current functional status  Therapy/Group: Individual Therapy  Dorna Mai 05/08/2013, 10:55 AM

## 2013-05-08 NOTE — Progress Notes (Signed)
Social Work  Social Work Assessment and Plan  Patient Details  Name: [name] MRN: [id number] Date of Birth: 02/25/1961  Today's Date: 05/08/2013  Problem List:  Patient Active Problem List   Diagnosis Date Noted  . Subarachnoid hemorrhage 05/05/2013  . SAH (subarachnoid hemorrhage) 04/21/2013  . Cerebral aneurysm, nonruptured 04/21/2013  . Protein-calorie malnutrition, severe 04/19/2013  . History of radiation therapy   . 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 Fracture, ankle 2012    right  . History of radiation therapy 09/23/10- 11/07/10    right parietal lobe  . Chronic headaches     uses oxycodone appropriately   Past Surgical History:  Past Surgical History  Procedure Laterality Date  . Orif ankle fracture  08/22/2010    right  . Radiology with anesthesia N/A 05/02/2013    Procedure: RADIOLOGY WITH ANESTHESIA;  Surgeon: [name], MD;  Location: [facility] OR;  Service: Radiology;  Laterality: N/A;   Social History:  reports that she has never smoked. She has never used smokeless tobacco. She reports that she drinks alcohol. She reports that she does not use illicit drugs.  Family / Support Systems Marital Status: Married How Long?: 31 years Patient Roles: Spouse;Parent (Has a husband and a 41 yr old son) Spouse/Significant Other: husband. [name] @  ([phone] Children: 25 yr old son, [name], who lives locally and may be returning to home with parents to assist Other Supports: pt's father, [name] [place] @ [phone] Anticipated Caregiver: Husband and son Ability/Limitations of Caregiver: Husband works [employer] 7 am to 5:30 pm.  Son planning to move back in with parents to assist Caregiver Availability: Other (Comment) (Have explained the need for 24 hr supervision at home.) Family Dynamics: pt very tearful when this  SW asks questions about reliability of support from husband and son post d/c - asks that her father leave the room when discussing this and explains later that her father does not feel that her family provides the support she needs at home but pt very defensive of husband and son.  Social History Preferred language: English Religion:  Cultural Background: NA Education: HS Read: Yes Write: Yes Employment Status: Disabled Fish farm manager Issues: none Guardian/Conservator: none - per MD, pt not capable of making decisions on her own behalf, therefore, deferred legally to spouse.   Abuse/Neglect Physical Abuse: Denies Verbal Abuse: Denies, provider concerned (Comment) Sexual Abuse: Denies Exploitation of patient/patient's resources: Denies, provider concerned (Comment) Self-Neglect: Denies Possible abuse reported to:: Other (Comment) (father feels pt's family has been negligent in their care of her)  Emotional Status Pt's affect, behavior adn adjustment status: pt very tearful throughout interview - very resistent to any discussion of anticipated support at home from husband and son.  When noted that her medical records indicate concern from other MD offices over the past two years, pt quickly states that husband "... does everything he can for me..." an denies any neglect.  She does confirm that she is alone at home qd while husband works and on nights that he then leave to play in his band.  She also confirms that her son "... has issues" and not reliable to be there with her consistently.  Again, she becomes tearful and protective of these men in her life.  Did not perform a formal depression screen, however,  feel that there is certainly a good bit of cognitive impairment and mood disturbance at play here and recommend neuropsych consult to help determine best course of approach to allow her a comfort level with staff to speak openly with [place]. Recent Psychosocial Issues: Pt's father  privately reports a tremendous amount of his own personal anger towards pt's husband over alledged neglect of pt at home.  Pt very aware of father's concerns, however, defends husband and his level of support. Pyschiatric History: Pt notes h/o anxiety taking meds prescribed by primary MD Substance Abuse History: None  Patient / Family Perceptions, Expectations & Goals Pt/Family understanding of illness & functional limitations: Pt able to report her understanding that she now has had an aneurysm in her brain that was embolized as well as a stroke.  This all in addition to her brain tumor diagnosed approx 2 years ago. Premorbid pt/family roles/activities: Pt notes that she was mobile at home with her walker and was able to perform toileting and simple meal prep independently.  No driving.  Alone for a good majority of everyday which she states is her preference. Anticipated changes in roles/activities/participation: pt will require 24/7 assistance at home per therapy goals, therefore, family will need to be able to assume caregiver roles vs pursue SNF or private duty care. Pt/family expectations/goals: Pt tearfully states, "I just want to go home."  [employer]: Other (Comment) ([facility]) Premorbid Home Care/DME Agencies: Other (Comment) ([facility] in past) Transportation available at discharge: yes at times Resource referrals recommended: Neuropsychology;Support group (specify)  Discharge Planning Living Arrangements: Spouse/significant other Support Systems: Spouse/significant other;Children;Parent Type of Residence: Private residence Insurance Resources: [employer] (specify);Medicare (Medcost (via spouse)) Financial Resources: SSD Financial Screen Referred: No Living Expenses: Rent Money Management: Spouse Does the patient have any problems obtaining your medications?: No Home Management: pt/ family Patient/Family Preliminary Plans: pt very focused  on returning home with husband and son and denying any concerns about level of care they will provide.  Confirms they will not be able to provide 24/7, however, states being alone during the day is her preference.  Pt does not appear to appreciate the safety concerns of her being left alone. Barriers to Discharge: Self care;Finances;Family Support Social Work Anticipated Follow Up Needs: HH/OP;SNF DC Planning Additional Notes/Comments: Obviously will need to speak further with pt's husband.  Have heard father's concerns.  Admissions Coordinator had husband sign admission statement that he understands she will need 24/7 minimum assist at home.  May have to change plan to SNF.  TBD Expected length of stay: 22-27 days  Clinical Impression Anticipate this to be a difficult case given concerns of pt's father about pt's care at home PTA and pt's denial of this.  Clear that she will require 24/7 care at home, however, family likely not able to provide this. Per MD, pt not capable of making decisions on her own behalf and this would then defer to her husband to do so.  To follow up with husband on Monday and discuss care needs further.  Will keep team posted.  Lashaunda Schild 05/08/2013, 4:30 PM

## 2013-05-08 NOTE — Progress Notes (Signed)
Subjective/Complaints: No new issues. Slept fairlly well. Denies pain.  A 12 point review of systems has been performed and if not noted above is otherwise negative.   Objective: Vital Signs: Blood pressure 133/75, pulse 82, temperature 98.4 F (36.9 C), temperature source Oral, resp. rate 17, height 5\' 4"  (1.626 m), weight 72.53 kg (159 lb 14.4 oz), last menstrual period 08/26/2010, SpO2 99.00%. No results found.  Recent Labs  05/06/13 0535  WBC 3.9*  HGB 9.7*  HCT 27.9*  PLT 116*    Recent Labs  05/06/13 0535  NA 134*  K 4.2  CL 102  GLUCOSE 97  BUN 19  CREATININE 0.85  CALCIUM 8.6   CBG (last 3)  No results found for this basename: GLUCAP,  in the last 72 hours  Wt Readings from Last 3 Encounters:  05/05/13 72.53 kg (159 lb 14.4 oz)  04/18/13 61.961 kg (136 lb 9.6 oz)  04/18/13 61.961 kg (136 lb 9.6 oz)    Physical Exam:  Vitals reviewed.  Eyes:  Pupils reactive to light  Neck: Normal range of motion. Neck supple. No thyromegaly present.  Cardiovascular: Normal rate and regular rhythm. No murmurs Respiratory: Effort normal and breath sounds normal. No respiratory distress. No wheezes  GI: Soft. Bowel sounds are normal. She exhibits no distension.  Musculoskeletal: She exhibits no pain with shoulder range of motion , no calf or thigh tenderness to palpation or swelling   Neurological:  Patient is alert. She was able to provide her name and place. Speech was dysarthric but intelligible. Delayed processing. fair insight and awareness  4 minus/5 in the right deltoid, bicep, tricep, grip, hip flexor, knee extensors, ankle dorsi flexion plantar flexor  Left upper extremity has 1+ pec, trace bicep, deltoid, tricep, 0 at hand Left lower extremity has 2 minus hip extension, 1 HAD, HAB, tr KE, 0 distally.  Absent sensation to light touch in the left upper and left lower extremity. Normal sensation to light touch in the right upper and right lower extremity. 1/4 tone  LUE and LLE. Left heel cord tight   Assessment/Plan: 1. 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 Physiatrist is providing close team supervision and 24 hour management of active medical problems listed below. Physiatrist and rehab team continue to assess barriers to discharge/monitor patient progress toward functional and medical goals. FIM: FIM - Bathing Bathing Steps Patient Completed: Chest;Abdomen;Front perineal area Bathing: 2: Max-Patient completes 3-4 35f 10 parts or 25-49%  FIM - Upper Body Dressing/Undressing Upper body dressing/undressing steps patient completed: Put head through opening of pull over shirt/dress Upper body dressing/undressing: 2: Max-Patient completed 25-49% of tasks FIM - Lower Body Dressing/Undressing Lower body dressing/undressing: 1: Two helpers  FIM - Toileting Toileting steps completed by patient: Performs perineal hygiene Toileting Assistive Devices: Grab bar or rail for support Toileting: 2: Max-Patient completed 1 of 3 steps  FIM - Diplomatic Services operational officer Devices: Bedside commode;Grab bars Toilet Transfers: 3-To toilet/BSC: Mod A (lift or lower assist);3-From toilet/BSC: Mod A (lift or lower assist)  FIM - Bed/Chair Transfer Bed/Chair Transfer Assistive Devices: Arm rests;HOB elevated;Bed rails Bed/Chair Transfer: 3: Bed > Chair or W/C: Mod A (lift or lower assist);3: Chair or W/C > Bed: Mod A (lift or lower assist)  FIM - Locomotion: Wheelchair Distance: 75'; PTA pt able to propel w/c w/ B UE; able to propel this session w/  R UE/LE, attempted B LE but decreased proprioception/attention to L LE Locomotion: Wheelchair: 2: Travels 50 - 149 ft with moderate assistance (Pt: 50 - 74%)  Comprehension Comprehension Mode: Auditory Comprehension: 3-Understands basic 50 - 74% of the time/requires  cueing 25 - 50%  of the time  Expression Expression Mode: Verbal Expression: 4-Expresses basic 75 - 89% of the time/requires cueing 10 - 24% of the time. Needs helper to occlude trach/needs to repeat words.  Social Interaction Social Interaction: 4-Interacts appropriately 75 - 89% of the time - Needs redirection for appropriate language or to initiate interaction.  Problem Solving Problem Solving: 3-Solves basic 50 - 74% of the time/requires cueing 25 - 49% of the time  Memory Memory: 3-Recognizes or recalls 50 - 74% of the time/requires cueing 25 - 49% of the time   Medical Problem List and Plan:  1. 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 Status post type line embolization RICA aneurysm   -PRAFO LLE 2. DVT Prophylaxis/Anticoagulation: SCDs  3. Chronic headaches/Pain Management: continue prn tylenol.  4. Mood/Anxiety. Ativan 1 mg every 6 hours as needed. Paxil 50 mg daily--continue to track  4. Neuropsych: This patient is not capable of making decisions on her own behalf.  5. Seizures: continue Keppra, and vimpat--dilantin to be resumed pending level 6. Dysphagia: Dysphagia 2 nectar liquids. Tolerating fairly well at present  7. E. Coli UTI: follow up culture 8. Acute renal insufficiency. Latest creatinine 0.85. May continue IVF for now pending fluid intake  -would like for PO to be at least 1000cc daily on a regular basis before I stop IVF  LOS (Days) 3 A FACE TO FACE EVALUATION WAS PERFORMED  Hristopher Missildine T 05/08/2013 8:31 AM

## 2013-05-08 NOTE — Progress Notes (Signed)
[facility] Individual Statement of Services  Patient Name:  [name]  Date:  05/08/2013  Welcome to the [facility].  Our goal is to provide you with an individualized program based on your diagnosis and situation, designed to meet your specific needs.  With this comprehensive rehabilitation program, you will be expected to participate in at least 3 hours of rehabilitation therapies Monday-Friday, with modified therapy programming on the weekends.  Your rehabilitation program will include the following services:  Physical Therapy (PT), Occupational Therapy (OT), Speech Therapy (ST), 24 hour per day rehabilitation nursing, Therapeutic Recreaction (TR), Neuropsychology, Case Management (Social Worker), Rehabilitation Medicine, Nutrition Services and Pharmacy Services  Weekly team conferences will be held on Tuesdays to discuss your progress.  Your Social Worker will talk with you frequently to get your input and to update you on team discussions.  Team conferences with you and your family in attendance may also be held.  Expected length of stay: 18-21 days  Overall anticipated outcome: minimum assistance   Depending on your progress and recovery, your program may change. Your Social Worker will coordinate services and will keep you informed of any changes. Your Social Worker's name and contact numbers are listed  below.  The following services may also be recommended but are not provided by the [facility]:   Driving Evaluations  Home Health Rehabiltiation Services  Outpatient Rehabilitation Services  Vocational Rehabilitation   Arrangements will be made to provide these services after discharge if needed.  Arrangements include referral to agencies that provide these services.  Your insurance has been verified to be:  Medcost and Medicare Your primary doctor is:  Dr. Gerri Spore  Pertinent information will be shared with  your doctor and your insurance company.  Social Worker:  [place], [place] [phone] or ([phone]   Information discussed with and copy given to patient by: Amada Jupiter, 05/08/2013, 4:33 PM

## 2013-05-08 NOTE — Progress Notes (Signed)
Occupational Therapy Note  Patient Details  Name: [name] MRN: [id number] Date of Birth: 24-Mar-1961 Today's Date: 05/08/2013  Time:0830-0930  (60 min)  1st session Pain:left foot and head  Mild pain, no number given.  RN aware Individual session    1st session:  Self care retraining at sink level with focus on visual and body attention to the left, sit to stand, standing balance with max assist but able to maintain for 1  Minute.  Needed plus 2 to apply depends while standing.  Placed LUE in weight bearing positions with max assist for stability.  Assistance with sequencing, task organization, following one step directions. Reguired physical cues to sit upright and not lean to left.  Pt. Taken to nursing station at end of session with safety belt, lap tray in place.    Time:1345-1430  (45 min)  2nd session Pain:  none2nd session:  Pt lying in bed asleep.  Increa Individual session.   Increased  time for arousal.  Addressed bed mobility, transfers, LUE management and positioning.  Went from supine to sit with mod assist.  Scooted to EOB with mod assist.  Performed lateral scoots to end of bed.  Transferred to wc with stand pivot with mod assist.  Increased time to advance right LE and perform movements in transverse plane.  . Left pt with next therapist .     Humberto Seals 05/08/2013, 8:53 AM

## 2013-05-09 ENCOUNTER — Inpatient Hospital Stay (HOSPITAL_COMMUNITY): Payer: PRIVATE HEALTH INSURANCE | Admitting: Physical Therapy

## 2013-05-09 ENCOUNTER — Inpatient Hospital Stay (HOSPITAL_COMMUNITY): Payer: PRIVATE HEALTH INSURANCE

## 2013-05-09 ENCOUNTER — Inpatient Hospital Stay (HOSPITAL_COMMUNITY): Payer: PRIVATE HEALTH INSURANCE | Admitting: Speech Pathology

## 2013-05-09 ENCOUNTER — Inpatient Hospital Stay (HOSPITAL_COMMUNITY): Payer: PRIVATE HEALTH INSURANCE | Admitting: Occupational Therapy

## 2013-05-09 NOTE — Progress Notes (Signed)
Subjective/Complaints: No complaints.slept well. Denies sob, cp, cough. .  A 12 point review of systems has been performed and if not noted above is otherwise negative.   Objective: Vital Signs: Blood pressure 130/78, pulse 84, temperature 98.5 F (36.9 C), temperature source Oral, resp. rate 17, height 5\' 4"  (1.626 m), weight 72.53 kg (159 lb 14.4 oz), last menstrual period 08/26/2010, SpO2 99.00%. No results found. No results found for this basename: WBC, HGB, HCT, PLT,  in the last 72 hours No results found for this basename: NA, K, CL, CO, GLUCOSE, BUN, CREATININE, CALCIUM,  in the last 72 hours CBG (last 3)  No results found for this basename: GLUCAP,  in the last 72 hours  Wt Readings from Last 3 Encounters:  05/05/13 72.53 kg (159 lb 14.4 oz)  04/18/13 61.961 kg (136 lb 9.6 oz)  04/18/13 61.961 kg (136 lb 9.6 oz)    Physical Exam:  Vitals reviewed.  Eyes:  Pupils reactive to light  Neck: Normal range of motion. Neck supple. No thyromegaly present.  Cardiovascular: Normal rate and regular rhythm. No murmurs Respiratory: Effort normal and breath sounds normal. No respiratory distress. No wheezes  GI: Soft. Bowel sounds are normal. She exhibits no distension.  Musculoskeletal: She exhibits no pain with shoulder range of motion , no calf or thigh tenderness to palpation or swelling   Neurological:  Patient is alert. She was able to provide her name and place. Speech was dysarthric but intelligible. Delayed processing. fair insight and awareness  4 minus/5 in the right deltoid, bicep, tricep, grip, hip flexor, knee extensors, ankle dorsi flexion plantar flexor  Left upper extremity has 1+ pec, trace bicep, deltoid, tricep, 0 at hand Left lower extremity has 2 minus hip extension, 1 HAD, HAB, tr KE, 0 distally.  Absent sensation to light touch in the left upper and left lower extremity. Normal sensation to light touch in the right upper and right lower extremity. 1/4 tone LUE  and LLE. Left heel cord tight   Assessment/Plan: 1. 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 Physiatrist is providing close team supervision and 24 hour management of active medical problems listed below. Physiatrist and rehab team continue to assess barriers to discharge/monitor patient progress toward functional and medical goals. FIM: FIM - Bathing Bathing Steps Patient Completed: Chest;Abdomen;Front perineal area;Buttocks;Right upper leg;Left upper leg Bathing: 3: Mod-Patient completes 5-7 21f 10 parts or 50-74%  FIM - Upper Body Dressing/Undressing Upper body dressing/undressing steps patient completed: Put head through opening of pull over shirt/dress Upper body dressing/undressing: 0: Wears gown/pajamas-no public clothing FIM - Lower Body Dressing/Undressing Lower body dressing/undressing steps patient completed: Thread/unthread left pants leg Lower body dressing/undressing: 1: Two helpers  FIM - Toileting Toileting steps completed by patient: Performs perineal hygiene Toileting Assistive Devices: Grab bar or rail for support Toileting: 2: Max-Patient completed 1 of 3 steps  FIM - Diplomatic Services operational officer Devices: Bedside commode;Grab bars Toilet Transfers: 2-To toilet/BSC: Max A (lift and lower assist);2-From toilet/BSC: Max A (lift and lower assist)  FIM - Banker Devices: Arm rests Bed/Chair Transfer: 2: Bed > Chair or W/C: Max A (lift and lower assist);2: Chair or W/C > Bed: Max A (lift and lower assist)  FIM - Locomotion: Wheelchair Distance: 75'; PTA pt able to propel w/c w/ B UE; able to propel this session w/ R UE/LE,  attempted B LE but decreased proprioception/attention to L LE Locomotion: Wheelchair: 2: Travels 50 - 149 ft with moderate assistance (Pt: 50 - 74%) FIM -  Locomotion: Ambulation Locomotion: Ambulation: 1: Travels less than 50 ft with total assistance/helper does all (Pt.<25%) (3 feet in in bars)  Comprehension Comprehension Mode: Auditory Comprehension: 3-Understands basic 50 - 74% of the time/requires cueing 25 - 50%  of the time  Expression Expression Mode: Verbal Expression: 4-Expresses basic 75 - 89% of the time/requires cueing 10 - 24% of the time. Needs helper to occlude trach/needs to repeat words.  Social Interaction Social Interaction: 4-Interacts appropriately 75 - 89% of the time - Needs redirection for appropriate language or to initiate interaction.  Problem Solving Problem Solving: 3-Solves basic 50 - 74% of the time/requires cueing 25 - 49% of the time  Memory Memory: 3-Recognizes or recalls 50 - 74% of the time/requires cueing 25 - 49% of the time   Medical Problem List and Plan:  1. 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 Status post type line embolization RICA aneurysm   -PRAFO LLE 2. DVT Prophylaxis/Anticoagulation: SCDs  3. Chronic headaches/Pain Management: continue prn tylenol.  4. Mood/Anxiety. Ativan 1 mg every 6 hours as needed. Paxil 50 mg daily--continue to track  4. Neuropsych: This patient is not capable of making decisions on her own behalf.  5. Seizures: continue Keppra, and vimpat--dilantin resumed. Check level tomorrow 6. Dysphagia: Dysphagia 2 nectar liquids. Tolerating fairly well at present--needs to improve intake 7. E. Coli UTI: follow up culture 8. Acute renal insufficiency. Latest creatinine 0.85. May continue IVF for now pending fluid intake  -would like for PO to be at least 1000cc daily on a regular basis before I stop IVF--still not consistent  LOS (Days) 4 A FACE TO FACE EVALUATION WAS PERFORMED  [name] 05/09/2013 8:12 AM

## 2013-05-09 NOTE — Progress Notes (Signed)
Edema noted to all extremities. LUE with large amount of dark bruising. RUE with bruising. Right foot/ankle edema. Bilateral feet cool to touch, but 1 plus pedal pulses.  Foam dressing to sacrum removed, saturated with urine. Stage 2 area to right buttock-barrier cream applied. No further tearful  Episodes. [name] A

## 2013-05-09 NOTE — Progress Notes (Signed)
Occupational Therapy Session Note  Patient Details  Name: [name] MRN: [id number] Date of Birth: [birth year]  Today's Date: 05/09/2013 Time: 7829-5621 Time Calculation (min): 30 min  Skilled Therapeutic Interventions/Progress Updates: Patient participated in [employer] group co-treat session with SLP.  Following set up to open containers and cut food, patient able to feed self with RUE and used straw for drinking.  Patient required mod cues to wipe left side of mouth, anterior spillage, checking for food pocketing in left cheek and attending to the left.  Provided mirror for self monitoring with wiping her mouth.   Therapy Documentation Precautions:  Precautions Precautions: Fall Precaution Comments: R UE hemiplegia, anxiety  Restrictions Weight Bearing Restrictions: No Pain: No reports of pain  Therapy/Group: Group Therapy  Mckade Gurka 05/09/2013, 2:41 PM

## 2013-05-09 NOTE — Progress Notes (Signed)
Occupational Therapy Session Note  Patient Details  Name: [name] MRN: [id number] Date of Birth: Sep 09, 1960  Today's Date: 05/09/2013 Time: 0900-1000 Time Calculation (min): 60 min  Short Term Goals: Week 1:     Skilled Therapeutic Interventions/Progress Updates:    Pt resting in bed eating breakfast upon arrival.  Pt does not currently have any clothing but was agreeable to bathing and donning a clean hospital gown.  Pt also stated that she didn't like sitting up at nurses station and requested to remain in her room at end of session.  Pt agreeable to remaining in bed at end of session.  Pt completed bathing and dressing tasks with sit<>stand from EOB.  Pt maintained dynamic sitting balance EOB with occasional steady A.  Increased tone noted in LUE during bathing and dressing tasks. Focus on bed mobility, dynamic sitting balance, attention to left, hemi dressing/bathing techniques, attention to task, sequencing, activity tolerance, and safety awareness.   Therapy Documentation Precautions:  Precautions Precautions: Fall Precaution Comments: R UE hemiplegia, anxiety  Restrictions Weight Bearing Restrictions: No General:   Vital Signs:   Pain: Pain Assessment Pain Assessment: No/denies pain  See FIM for current functional status  Therapy/Group: Individual Therapy  Rich Brave 05/09/2013, 10:05 AM

## 2013-05-09 NOTE — Progress Notes (Signed)
Speech Language Pathology Daily Session Notes  Patient Details  Name: [name] MRN: [id number] Date of Birth: 1961-03-26  Today's Date: 05/09/2013  Session 1 Time: 1478-2956 Time Calculation (min): 40 min  Session 2 Time: 1140-1155 Time Calculation: 15 min  Short Term Goals: Week 1: SLP Short Term Goal 1 (Week 1): Pt will utilize swallowing compensatory strategies to minimize overt s/s of aspiration with Min A verbal and question cues.  SLP Short Term Goal 2 (Week 1): Pt will attend sustain attention to a functional task for 30 minutes with Min A verbal cues for redirection  SLP Short Term Goal 3 (Week 1): Pt will utilize external memory aids to recall new, daily information with Min A question and visual cues.  SLP Short Term Goal 4 (Week 1): Pt will demonstrate functional problem solving for basic and familiar tasks with Min A multimodal cueing  SLP Short Term Goal 5 (Week 1): Pt will utilize call bell to request assistance with Min verbal and question cues. SLP Short Term Goal 6 (Week 1): Pt will utilize speech intelligibility strategies at the phrase level with supervision verbal cues.   Skilled Therapeutic Interventions:  Session 1: Treatment focus on cognitive-linguistic and dysphagia goals. Upon arrival, pt awake in bed and requested a magic cup. Pt required Mod A verbal and question cues to attend to left anterior spillage and demonstrated overt cough X 3, suspect due to large bites.  SLP also facilitated session by providing basic reading comprehension tasks. Pt completed tasks with extra time and supervision question cues but then become labile. Pt reported she had a headache that came about once she "started to think."  She required Mod question cues to problem solving how to requested medication from the nurse. Continue plan of care.    Session 2: Patient participated in co-treatment with OT in [employer] with focus on cognitive and dysphagia goals. SLP facilitated session  by providing skilled observation with lunch meal of Dys. 2 textures with nectar-thick liquids via straw. Pt without overt s/s of aspiration but required Mod verbal cues to attend to left anterior spillage and to self-monitor and correct left pocketing. Pt provided mirror for to increase ability to self-monitor and correct. Continue plan of care.   FIM:  Comprehension Comprehension Mode: Auditory Comprehension: 3-Understands basic 50 - 74% of the time/requires cueing 25 - 50%  of the time Expression Expression Mode: Verbal Expression: 3-Expresses basic 50 - 74% of the time/requires cueing 25 - 50% of the time. Needs to repeat parts of sentences. Social Interaction Social Interaction: 3-Interacts appropriately 50 - 74% of the time - May be physically or verbally inappropriate. Problem Solving Problem Solving: 3-Solves basic 50 - 74% of the time/requires cueing 25 - 49% of the time Memory Memory: 3-Recognizes or recalls 50 - 74% of the time/requires cueing 25 - 49% of the time FIM - Eating Eating Activity: 5: Supervision/cues;5: Set-up assist for cut food;5: Set-up assist for open containers  Pain Pain Assessment Pain Score: 5  Pain Type: Acute pain Pain Location: Head Pain Descriptors / Indicators: Aching Patients Stated Pain Goal: 0 Pain Intervention(s): RN made aware;Repositioned;Emotional support  Therapy/Group: Individual Therapy and Group Therapy  Darryl Blumenstein 05/09/2013, 3:52 PM

## 2013-05-09 NOTE — Progress Notes (Signed)
Physical Therapy Note  Patient Details  Name: [name] MRN: [id number] Date of Birth: 1961-02-25 Today's Date: 05/09/2013  1345-1445 (60 minutes) individual  Pain: no complaint of pain Focus of treatment: transfer training; sit to stand; standing tolerance/weight bearing left LE Treatment: Pt requesting to use bedside commode; stand turn wc to [facility] max assist +1 + second person for hygiene; sit to stand to EVA walker X 3 mod/max assist ; pt able to stand for approximately 2 minutes with tactile cues for knee extension on left min/mod assist; transfers scoot setup + min assist ; stand /turn wc <> walker mod/max assist. Returned to room with quick release belt in place.   Shalik Sanfilippo,JIM 05/09/2013, 2:04 PM

## 2013-05-09 NOTE — Plan of Care (Signed)
Problem: RH SKIN INTEGRITY Goal: RH STG MAINTAIN SKIN INTEGRITY WITH ASSISTANCE STG Maintain Skin Integrity With mod Assistance.  Outcome: Not Progressing Requires total assist

## 2013-05-10 ENCOUNTER — Inpatient Hospital Stay (HOSPITAL_COMMUNITY): Payer: PRIVATE HEALTH INSURANCE | Admitting: Speech Pathology

## 2013-05-10 ENCOUNTER — Encounter (HOSPITAL_COMMUNITY): Payer: PRIVATE HEALTH INSURANCE

## 2013-05-10 ENCOUNTER — Inpatient Hospital Stay (HOSPITAL_COMMUNITY): Payer: PRIVATE HEALTH INSURANCE

## 2013-05-10 ENCOUNTER — Inpatient Hospital Stay (HOSPITAL_COMMUNITY): Payer: PRIVATE HEALTH INSURANCE | Admitting: Occupational Therapy

## 2013-05-10 DIAGNOSIS — I609 Nontraumatic subarachnoid hemorrhage, unspecified: Secondary | ICD-10-CM

## 2013-05-10 LAB — BASIC METABOLIC PANEL
BUN: 22 mg/dL (ref 6–23)
Calcium: 8.5 mg/dL (ref 8.4–10.5)
Chloride: 106 mEq/L (ref 96–112)
GFR calc Af Amer: 81 mL/min — ABNORMAL LOW (ref >90)
Glucose, Bld: 109 mg/dL — ABNORMAL HIGH (ref 70–99)
Potassium: 4.1 mEq/L (ref 3.5–5.1)
Sodium: 138 mEq/L (ref 135–145)

## 2013-05-10 MED ORDER — DEXAMETHASONE 2 MG PO TABS
2.0000 mg | ORAL_TABLET | Freq: Two times a day (BID) | ORAL | Status: DC
  Administered 2013-05-10 – 2013-05-20 (×20): 2 mg via ORAL
  Filled 2013-05-10 (×23): qty 1

## 2013-05-10 NOTE — Progress Notes (Signed)
Occupational Therapy Note  Patient Details  Name: [name] MRN: [id number] Date of Birth: Jun 06, 1960 Today's Date: 05/10/2013 Time: 1140-1210 (30 mins)  Pt seen for skilled co-treat with SLP in [employer] with focus on self-feeding.  Pt required setup assist to open containers and min verbal cues to check for pocketing and monitor spillage.  Pt required increased time for self-feeding but was able to scoop food and bring to mouth without requiring additional assist.  Pt with no c/o pain this session.  Rosalio Loud 05/10/2013, 3:18 PM

## 2013-05-10 NOTE — Progress Notes (Signed)
Subjective/Complaints: Doesn't like being stuck for labs. Able to sleep a little last night.  A 12 point review of systems has been performed and if not noted above is otherwise negative.   Objective: Vital Signs: Blood pressure 130/81, pulse 82, temperature 97.6 F (36.4 C), temperature source Oral, resp. rate 18, height 5\' 4"  (1.626 m), weight 72.53 kg (159 lb 14.4 oz), last menstrual period 08/26/2010, SpO2 98.00%. No results found. No results found for this basename: WBC, HGB, HCT, PLT,  in the last 72 hours No results found for this basename: NA, K, CL, CO, GLUCOSE, BUN, CREATININE, CALCIUM,  in the last 72 hours CBG (last 3)  No results found for this basename: GLUCAP,  in the last 72 hours  Wt Readings from Last 3 Encounters:  05/05/13 72.53 kg (159 lb 14.4 oz)  04/18/13 61.961 kg (136 lb 9.6 oz)  04/18/13 61.961 kg (136 lb 9.6 oz)    Physical Exam:  Vitals reviewed.  Eyes:  Pupils reactive to light  Neck: Normal range of motion. Neck supple. No thyromegaly present.  Cardiovascular: Normal rate and regular rhythm. No murmurs Respiratory: Effort normal and breath sounds normal. No respiratory distress. No wheezes  GI: Soft. Bowel sounds are normal. She exhibits no distension.  Musculoskeletal: She exhibits no pain with shoulder range of motion , no calf or thigh tenderness to palpation or swelling   Neurological:  Patient is alert. She was able to provide her name and place. Speech was dysarthric but intelligible. Delayed processing. fair insight and awareness  4 minus/5 in the right deltoid, bicep, tricep, grip, hip flexor, knee extensors, ankle dorsi flexion plantar flexor  Left upper extremity has 1+ pec, trace bicep, deltoid, tricep, 0 at hand Left lower extremity has 2 minus hip extension, 1 HAD, HAB, tr KE, 0 distally.  Absent sensation to light touch in the left upper and left lower extremity. Normal sensation to light touch in the right upper and right lower  extremity. 1/4 tone LUE and LLE. Left heel cord tight Psych: appears a little tearful.  Assessment/Plan: 1. 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 Physiatrist is providing close team supervision and 24 hour management of active medical problems listed below. Physiatrist and rehab team continue to assess barriers to discharge/monitor patient progress toward functional and medical goals. FIM: FIM - Bathing Bathing Steps Patient Completed: Chest;Left Arm;Abdomen;Front perineal area;Right upper leg;Left upper leg Bathing: 3: Mod-Patient completes 5-7 32f 10 parts or 50-74%  FIM - Upper Body Dressing/Undressing Upper body dressing/undressing steps patient completed: Put head through opening of pull over shirt/dress Upper body dressing/undressing: 0: Wears gown/pajamas-no public clothing FIM - Lower Body Dressing/Undressing Lower body dressing/undressing steps patient completed: Thread/unthread left pants leg Lower body dressing/undressing: 0: Wears gown/pajamas-no public clothing  FIM - Toileting Toileting steps completed by patient: Performs perineal hygiene Toileting Assistive Devices: Grab bar or rail for support Toileting: 2: Max-Patient completed 1 of 3 steps  FIM - Diplomatic Services operational officer Devices: Psychiatrist Transfers: 2-To toilet/BSC: Max A (lift and lower assist);2-From toilet/BSC: Max A (lift and lower assist);1-Two helpers  FIM - Banker Devices: Arm rests Bed/Chair Transfer: 1: Two helpers;2: Bed > Chair or W/C: Max A (lift and lower assist);2: Sit > Supine: Max A (lifting assist/Pt. 25-49%);3: Supine > Sit: Mod A (lifting assist/Pt. 50-74%/lift 2 legs;2: Chair or W/C >  Bed: Max A (lift and lower assist)  FIM - Locomotion: Wheelchair Distance: 75'; PTA pt able to  propel w/c w/ B UE; able to propel this session w/ R UE/LE, attempted B LE but decreased proprioception/attention to L LE Locomotion: Wheelchair: 2: Travels 50 - 149 ft with moderate assistance (Pt: 50 - 74%) FIM - Locomotion: Ambulation Locomotion: Ambulation: 1: Travels less than 50 ft with total assistance/helper does all (Pt.<25%) (3 feet in in bars)  Comprehension Comprehension Mode: Auditory Comprehension: 4-Understands basic 75 - 89% of the time/requires cueing 10 - 24% of the time  Expression Expression Mode: Verbal Expression: 4-Expresses basic 75 - 89% of the time/requires cueing 10 - 24% of the time. Needs helper to occlude trach/needs to repeat words.  Social Interaction Social Interaction: 4-Interacts appropriately 75 - 89% of the time - Needs redirection for appropriate language or to initiate interaction.  Problem Solving Problem Solving: 3-Solves basic 50 - 74% of the time/requires cueing 25 - 49% of the time  Memory Memory: 4-Recognizes or recalls 75 - 89% of the time/requires cueing 10 - 24% of the time   Medical Problem List and Plan:  1. 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 Status post type line embolization RICA aneurysm   -PRAFO LLE 2. DVT Prophylaxis/Anticoagulation: SCDs  3. Chronic headaches/Pain Management: continue prn tylenol.  4. Mood/Anxiety. Ativan 1 mg every 6 hours as needed. Paxil 50 mg daily--provide egosupport  4. Neuropsych: This patient is not capable of making decisions on her own behalf.  5. Seizures: continue Keppra, and vimpat--dilantin resumed. Level today pending 6. Dysphagia: Dysphagia 2 nectar liquids. Tolerating fairly well at present--needs to improve intake 7. E. Coli UTI: follow up culture 8. Acute renal insufficiency. Latest creatinine 0.85. May continue IVF for now pending fluid intake  -would like for PO to be at least 1000cc daily on a regular basis before I stop  IVF--still not consistent  LOS (Days) 5 A FACE TO FACE EVALUATION WAS PERFORMED  [name] T 05/10/2013 8:05 AM

## 2013-05-10 NOTE — Progress Notes (Signed)
Social Work Patient ID: [name], female   DOB: 04-22-1961, 52 y.o.   MRN: [id number]   Spoke with pt's husband this morning to discuss d/c care needs anticipated by therapies.  Stressing to him that current goals are set for min assist which indicates need for 24/7 care at home.  Husband reports the "plan right now..." is for son to move back into home and be primary caregiver during the day with husband home at night.  Discussed need for this plan to be reliable and need to complete family education.  Husband requesting that family ed be scheduled for a weekend day for both himself and son.  As noted on initial assessment, pt's father expressing much concern about care pt was receiving PTA and alleges that husband and son are not reliable to provide 24/7 care. Pt denies any concerns and feels husband will "take care of me".  Plan to follow up with husband following team conference today.  Will need to monitor this case closely with hopes to reach optimal d/c plan which is safe for pt.    Chrystel Barefield, LCSW

## 2013-05-10 NOTE — Progress Notes (Signed)
Occupational Therapy Session Note  Patient Details  Name: [name] MRN: [id number] Date of Birth: 1960-11-22  Today's Date: 05/10/2013 Time: 1330-1400 Time Calculation (min): 30 min   Skilled Therapeutic Interventions/Progress Updates:    1:1 Neuro reeducation with left UE addressing functional movement in a gravity eliminated plane (with towel to decr friction). Pt required more than reasonable amt of time to process information and perform motor planning. Pt practiced propelling w/c hemi technique with min A to steer in functional environment.  Therapy Documentation Precautions:  Precautions Precautions: Fall Precaution Comments: R UE hemiplegia, anxiety  Restrictions Weight Bearing Restrictions: No    Pain: Tinging in her left UE - RN aware See FIM for current functional status  Therapy/Group: Individual Therapy  Roney Mans [facility] 05/10/2013, 2:31 PM

## 2013-05-10 NOTE — Progress Notes (Signed)
Speech Language Pathology Daily Session Note  Patient Details  Name: [name] MRN: [id number] Date of Birth: August 07, 1960  Today's Date: 05/10/2013 Time: 7829-5621 Time Calculation (min): 45 min  Short Term Goals: Week 1: SLP Short Term Goal 1 (Week 1): Pt will utilize swallowing compensatory strategies to minimize overt s/s of aspiration with Min A verbal and question cues.  SLP Short Term Goal 2 (Week 1): Pt will attend sustain attention to a functional task for 30 minutes with Min A verbal cues for redirection  SLP Short Term Goal 3 (Week 1): Pt will utilize external memory aids to recall new, daily information with Min A question and visual cues.  SLP Short Term Goal 4 (Week 1): Pt will demonstrate functional problem solving for basic and familiar tasks with Min A multimodal cueing  SLP Short Term Goal 5 (Week 1): Pt will utilize call bell to request assistance with Min verbal and question cues. SLP Short Term Goal 6 (Week 1): Pt will utilize speech intelligibility strategies at the phrase level with supervision verbal cues.   Skilled Therapeutic Interventions: Treatment focus on cognitive goals. SLP facilitated session by providing Mod A semantic cues for scanning to left field of environment to locate specific items throughout a task.  Pt reported pain in foot during session, RN made aware.  Pt demonstrated increased speech intelligibility today at the sentence level with increased utilization of an increased vocal intensity. Continue plan of care.    FIM:  Comprehension Comprehension Mode: Auditory Comprehension: 4-Understands basic 75 - 89% of the time/requires cueing 10 - 24% of the time Expression Expression Mode: Verbal Expression: 4-Expresses basic 75 - 89% of the time/requires cueing 10 - 24% of the time. Needs helper to occlude trach/needs to repeat words. Social Interaction Social Interaction: 4-Interacts appropriately 75 - 89% of the time - Needs redirection for  appropriate language or to initiate interaction. Problem Solving Problem Solving: 3-Solves basic 50 - 74% of the time/requires cueing 25 - 49% of the time Memory Memory: 3-Recognizes or recalls 50 - 74% of the time/requires cueing 25 - 49% of the time  Pain Pain Assessment Pain Assessment: No/denies pain Pain Score: 0-No pain Pain Type: Acute pain Pain Location: Foot Pain Orientation: Left Pain Descriptors / Indicators: Aching;Discomfort Pain Frequency: Occasional Pain Onset: Gradual Patients Stated Pain Goal: 2 Pain Intervention(s): Medication (See eMAR) (tylenol 650 mgpo)  Therapy/Group: Individual Therapy  Ricco Dershem 05/10/2013, 2:04 PM

## 2013-05-10 NOTE — Progress Notes (Signed)
Occupational Therapy Session Note  Patient Details  Name: [name] MRN: [id number] Date of Birth: [birth year]  Today's Date: 05/10/2013 Time: 0900-1000 Time Calculation (min): 60 min  Short Term Goals: Week 1:     Skilled Therapeutic Interventions/Progress Updates:    Pt seated in w/c eating breakfast upon arrival.  Pt's father initially present but patient requested he leave room for bathing and dressing. Pt engaged in bathing and dressing with sit<>stand from w/c at sink. Pt required max verbal cues to attend to Lt side of body during bathing tasks.  Pt required tot A + 2 to bathe buttocks and pull up pants while standing.  Pt required mod A for sit<>stand and standing but is unable to remove RUE from support position to assist with tasks.  Pt requires extra time to respond to commands.  Pt initiates attempting to use LUE during bathing and dressing tasks when requested.  Pt exhibiting trace shoulder flexion and elbow flexion.  Focus on activity tolerance, task initiation, sequencing, attention to left, sit<>stand, standing balance, and safety awareness.  Therapy Documentation Precautions:  Precautions Precautions: Fall Precaution Comments: R UE hemiplegia, anxiety  Restrictions Weight Bearing Restrictions: No    Pain: Pain Assessment Pain Assessment: 0-10 Pain Score: 6  Pain Type: Acute pain Pain Location: Foot Pain Orientation: Left Pain Descriptors / Indicators: Aching;Discomfort Pain Frequency: Occasional Pain Onset: Gradual Patients Stated Pain Goal: 2 Pain Intervention(s): Medication (See eMAR) (tylenol 650 mgpo)  See FIM for current functional status  Therapy/Group: Individual Therapy  Rich Brave 05/10/2013, 11:48 AM

## 2013-05-10 NOTE — Progress Notes (Signed)
Speech Language Pathology Daily Session Note  Patient Details  Name: [name] MRN: [id number] Date of Birth: Oct 20, 1960  Today's Date: 05/10/2013 Time: 1210-1240 Time Calculation (min): 30 min  Short Term Goals: Week 1: SLP Short Term Goal 1 (Week 1): Pt will utilize swallowing compensatory strategies to minimize overt s/s of aspiration with Min A verbal and question cues.  SLP Short Term Goal 2 (Week 1): Pt will attend sustain attention to a functional task for 30 minutes with Min A verbal cues for redirection  SLP Short Term Goal 3 (Week 1): Pt will utilize external memory aids to recall new, daily information with Min A question and visual cues.  SLP Short Term Goal 4 (Week 1): Pt will demonstrate functional problem solving for basic and familiar tasks with Min A multimodal cueing  SLP Short Term Goal 5 (Week 1): Pt will utilize call bell to request assistance with Min verbal and question cues. SLP Short Term Goal 6 (Week 1): Pt will utilize speech intelligibility strategies at the phrase level with supervision verbal cues.   Skilled Therapeutic Interventions: Patient participated in skilled co-treatment with OT in [employer] with focus on dysphagia and cognitive goals. SLP facilitated session with skilled observation of Dys.2 textures and thin liquids with Supervision level verbal cues to initiate and transition steps of self-feeding.  SLP also provided Supervision level verbal cues to self-monitor pocketing, which was effective at preventing no overt s/s of aspiration throughout meal. Continue plan of care.    FIM:  Comprehension Comprehension Mode: Auditory Comprehension: 4-Understands basic 75 - 89% of the time/requires cueing 10 - 24% of the time Expression Expression Mode: Verbal Expression: 4-Expresses basic 75 - 89% of the time/requires cueing 10 - 24% of the time. Needs helper to occlude trach/needs to repeat words. Social Interaction Social Interaction: 4-Interacts  appropriately 75 - 89% of the time - Needs redirection for appropriate language or to initiate interaction. Problem Solving Problem Solving: 3-Solves basic 50 - 74% of the time/requires cueing 25 - 49% of the time Memory Memory: 3-Recognizes or recalls 50 - 74% of the time/requires cueing 25 - 49% of the time  Pain Pain Assessment Pain Assessment: No/denies pain  Therapy/Group: Group Therapy  Charlane Ferretti., CCC-SLP [phone]  [name] 05/10/2013, 1:55 PM

## 2013-05-10 NOTE — Progress Notes (Signed)
Physical Therapy Session Note  Patient Details  Name: [name] MRN: [id number] Date of Birth: April 04, 1961  Today's Date: 05/10/2013 Time: 0840-0900 Time Calculation (min): 20 min  Short Term Goals: Week 1:  PT Short Term Goal 1 (Week 1): Pt to perform bed mobility w/ min A, 75% of time and cues to attend to L UE/LE, 75% of time PT Short Term Goal 2 (Week 1): Pt to perform transfer bed<>w/c w/ min A, 75% of time PT Short Term Goal 3 (Week 1): Pt to propel w/c 100' w/ min A PT Short Term Goal 4 (Week 1): Pt to ambulate 10' w/ LRAD and mod A  Skilled Therapeutic Interventions/Progress Updates:  1:1. Pt received semi-reclined in bed eating breakfast, RN requesting increased time for nursing needs. Upon reentry, pt still eating breakfast, encouraged transition to sitting up in w/c for improved posture to finish eating. Pt req mod A for t/f sup>sit w/ HOB elevated and bed rails as well as mod A for squat pivot t/f bed>w/c. Single step verbal/tactile cues for seq and safety. Pt req consistent cueing to attend to residual food on L side of face during eating. Pt sitting in w/c at end of session to finish eating w/ father in room, father verbalized understanding of appropriate cues for eating. All needs w/in reach.   Therapy Documentation Precautions:  Precautions Precautions: Fall Precaution Comments: R UE hemiplegia, anxiety  Restrictions Weight Bearing Restrictions: No General: Amount of Missed PT Time (min): 10 Minutes Missed Time Reason: Nursing care Pain: Pain Assessment Pain Assessment: 0-10 Pain Score: 6  Pain Type: Acute pain Pain Location: Foot Pain Orientation: Left Pain Descriptors / Indicators: Aching;Discomfort Pain Frequency: Occasional Pain Onset: Gradual Patients Stated Pain Goal: 2 Pain Intervention(s): Medication (See eMAR) (tylenol 650 mgpo)  See FIM for current functional status  Therapy/Group: Individual Therapy  Denzil Hughes 05/10/2013, 12:22 PM

## 2013-05-11 ENCOUNTER — Inpatient Hospital Stay (HOSPITAL_COMMUNITY): Payer: PRIVATE HEALTH INSURANCE

## 2013-05-11 ENCOUNTER — Encounter (HOSPITAL_COMMUNITY): Payer: PRIVATE HEALTH INSURANCE | Admitting: Occupational Therapy

## 2013-05-11 ENCOUNTER — Inpatient Hospital Stay (HOSPITAL_COMMUNITY): Payer: PRIVATE HEALTH INSURANCE | Admitting: Speech Pathology

## 2013-05-11 DIAGNOSIS — I609 Nontraumatic subarachnoid hemorrhage, unspecified: Secondary | ICD-10-CM

## 2013-05-11 MED ORDER — PAROXETINE HCL ER 37.5 MG PO TB24
62.5000 mg | ORAL_TABLET | Freq: Every day | ORAL | Status: DC
  Administered 2013-05-12 – 2013-05-13 (×2): 62.5 mg via ORAL
  Filled 2013-05-11 (×4): qty 1

## 2013-05-11 NOTE — Progress Notes (Signed)
Speech Language Pathology Daily Session Notes  Patient Details  Name: [name] MRN: [id number] Date of Birth: Mar 18, 1961  Today's Date: 05/11/2013  Session 1 Time: 1100-1130 Time Calculation (min): 30 min  Session 2 Time: 1130-1200 Time Calculation: 30 min  Short Term Goals: Week 1: SLP Short Term Goal 1 (Week 1): Pt will utilize swallowing compensatory strategies to minimize overt s/s of aspiration with Min A verbal and question cues.  SLP Short Term Goal 2 (Week 1): Pt will attend sustain attention to a functional task for 30 minutes with Min A verbal cues for redirection  SLP Short Term Goal 3 (Week 1): Pt will utilize external memory aids to recall new, daily information with Min A question and visual cues.  SLP Short Term Goal 4 (Week 1): Pt will demonstrate functional problem solving for basic and familiar tasks with Min A multimodal cueing  SLP Short Term Goal 5 (Week 1): Pt will utilize call bell to request assistance with Min verbal and question cues. SLP Short Term Goal 6 (Week 1): Pt will utilize speech intelligibility strategies at the phrase level with supervision verbal cues.   Skilled Therapeutic Interventions:  Session 1: Treatment focus on cognitive goals. SLP facilitated session by providing Mod verbal and visual cues for functional problems solving during 4 step sequencing task with picture cards.  Pt also required Mod verbal cues to attend to left field of environment throughout the session. Pt appeared fatigued and required extra time to initiate and complete tasks.  Continue plan of care.  Session 2: Pt participated on co-treatment with  OT in [employer] with focus on self-feeding and dysphagia goals. Pt consumed Dys. 2 textures with nectar-thick liquids via straw with cough X 1, suspect due to large bites. Pt required Mod verbal cues for utilization of small bites and to self-monitor and correct left pocketing and anterior spillage. Pt with increased fatigue  throughout the meal and required extra time for consumption. Continue plan of care.   FIM:  Comprehension Comprehension Mode: Auditory Comprehension: 3-Understands basic 50 - 74% of the time/requires cueing 25 - 50%  of the time Expression Expression Mode: Verbal Expression: 3-Expresses basic 50 - 74% of the time/requires cueing 25 - 50% of the time. Needs to repeat parts of sentences. Social Interaction Social Interaction: 3-Interacts appropriately 50 - 74% of the time - May be physically or verbally inappropriate. Problem Solving Problem Solving: 3-Solves basic 50 - 74% of the time/requires cueing 25 - 49% of the time Memory Memory: 3-Recognizes or recalls 50 - 74% of the time/requires cueing 25 - 49% of the time FIM - Eating Eating Activity: 5: Supervision/cues;5: Set-up assist for open containers;5: Needs verbal cues/supervision;4: Helper checks for pocketed food  Pain Pain Assessment No/Denies Pain  Therapy/Group: Individual Therapy and Group Therapy  Kahli Mayon 05/11/2013, 3:33 PM

## 2013-05-11 NOTE — Progress Notes (Signed)
Social Work Patient ID: [name], female   DOB: 1961-04-28, 52 y.o.   MRN: [id number]  Have reviewed team conference with both pt and husband and have openly discussed care needs and concerns of whether this can truly be provided at home.  Pt very tearful with this discussion, but does agree that she will likely not have needed care at home.  Husband also agreed that family cannot provide the level of assist she needs.  Both agreed on change of d/c plan to SNF at this point.  Will continue to follow for support and d/c plans.  Savir Blanke, LCSW

## 2013-05-11 NOTE — Progress Notes (Signed)
Occupational Therapy Session Note  Patient Details  Name: [name] MRN: [id number] Date of Birth: Oct 27, 1960  Today's Date: 05/11/2013 Time: 0900-1000 Time Calculation (min): 60 min   Skilled Therapeutic Interventions/Progress Updates:    1:1 Pt tearful throughout session about anticipation of being cold with showering, not having clothes, and fear and at times was difficulty to console but could be be redirect. Self care retraining at shower level. Performed sit to stands for doffing and donning clothing with SARA PLUS with focus on maintaining upright trunk posture and head posture at midline. Transferred w/c to roll in shower chair with SARA plus. In shower focus on sitting posture and balance with max cuing to lean towards the right to maintain sitting at midline. Functional use of right UE with max A with more than reasonable amt of time needed for initiation of motor movement due to decr motor planning and processing command. Pt maintains a head posture downward and tilted to the left - max cuing throughout session to maintain midline positioning.  Pt attempted to call husband to request more clothing at end of session .   Therapy Documentation Precautions:  Precautions Precautions: Fall Precaution Comments: R UE hemiplegia, anxiety  Restrictions Weight Bearing Restrictions: No Pain: Pain Assessment Pain Score: 0-No pain Faces Pain Scale: No hurt  See FIM for current functional status  Therapy/Group: Individual Therapy  Roney Mans [facility] 05/11/2013, 10:58 AM

## 2013-05-11 NOTE — Progress Notes (Signed)
Occupational Therapy Session Note  Patient Details  Name: JOUA BAKE MRN: [id number] Date of Birth: 14-Oct-1960  Today's Date: 05/11/2013 Time: 1200-1225 Time Calculation (min): 25 min  Pt seen for skilled co-treat with SLP in [employer] with focus on self-feeding. Pt required setup assist to open containers and min verbal cues to check for pocketing and monitor spillage. Pt required prolonged session time for self-feeding but was able to scoop food and bring to mouth with min assist to reposition plate.   Pain: Pain Assessment Pain Assessment: 0-10 Pain Score: 0-No pain Faces Pain Scale: No hurt Pain Type: Acute pain Pain Location: Head Pain Descriptors / Indicators: Aching Pain Onset: Gradual Patients Stated Pain Goal: 2 Pain Intervention(s): Medication (See eMAR)   Therapy/Group: Group Therapy  Arnold Kester 05/11/2013, 4:07 PM

## 2013-05-11 NOTE — Progress Notes (Signed)
Subjective/Complaints: No new complaints. Slept ok. Denies pain. Offers little when queried today A 12 point review of systems has been performed and if not noted above is otherwise negative.   Objective: Vital Signs: Blood pressure 144/80, pulse 90, temperature 98.3 F (36.8 C), temperature source Oral, resp. rate 18, height 5\' 4"  (1.626 m), weight 72.53 kg (159 lb 14.4 oz), last menstrual period 08/26/2010, SpO2 98.00%. No results found. No results found for this basename: WBC, HGB, HCT, PLT,  in the last 72 hours  Recent Labs  05/10/13 0705  NA 138  K 4.1  CL 106  GLUCOSE 109*  BUN 22  CREATININE 0.91  CALCIUM 8.5   CBG (last 3)  No results found for this basename: GLUCAP,  in the last 72 hours  Wt Readings from Last 3 Encounters:  05/05/13 72.53 kg (159 lb 14.4 oz)  04/18/13 61.961 kg (136 lb 9.6 oz)  04/18/13 61.961 kg (136 lb 9.6 oz)    Physical Exam:  Vitals reviewed.  Eyes:  Pupils reactive to light  Neck: Normal range of motion. Neck supple. No thyromegaly present.  Cardiovascular: Normal rate and regular rhythm. No murmurs Respiratory: Effort normal and breath sounds normal. No respiratory distress. No wheezes  GI: Soft. Bowel sounds are normal. She exhibits no distension.  Musculoskeletal: She exhibits no pain with shoulder range of motion , no calf or thigh tenderness to palpation or swelling   Neurological:  Patient is alert. She was able to provide her name and place. Speech was dysarthric but intelligible. Delayed processing. fair insight and awareness  4 minus/5 in the right deltoid, bicep, tricep, grip, hip flexor, knee extensors, ankle dorsi flexion plantar flexor  Left upper extremity has 1+ pec, trace bicep, deltoid, tricep, 0 at hand Left lower extremity has 2 minus hip extension, 1 HAD, HAB, tr KE, 0 distally.  Absent sensation to light touch in the left upper and left lower extremity. Normal sensation to light touch in the right upper and right  lower extremity. 1/4 tone LUE and LLE. Left heel cord tight Psych: appears very flat  Assessment/Plan: 1. 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 Physiatrist is providing close team supervision and 24 hour management of active medical problems listed below. Physiatrist and rehab team continue to assess barriers to discharge/monitor patient progress toward functional and medical goals.  dispo situation appears problematic. Will work with pt and team to come up with a safe dc plan. May ultimately need SNF to guarantee safety.   FIM: FIM - Bathing Bathing Steps Patient Completed: Chest;Left Arm;Abdomen;Right upper leg;Left upper leg Bathing: 3: Mod-Patient completes 5-7 54f 10 parts or 50-74%  FIM - Upper Body Dressing/Undressing Upper body dressing/undressing steps patient completed: Put head through opening of pull over shirt/dress Upper body dressing/undressing: 2: Max-Patient completed 25-49% of tasks FIM - Lower Body Dressing/Undressing Lower body dressing/undressing steps patient completed: Thread/unthread left pants leg Lower body dressing/undressing: 1: Two helpers  FIM - Toileting Toileting steps completed by patient: Performs perineal hygiene Toileting Assistive Devices: Grab bar or rail for support Toileting: 2: Max-Patient completed 1 of 3 steps  FIM - Diplomatic Services operational officer Devices: Psychiatrist Transfers: 2-To toilet/BSC: Max A (lift and lower assist);2-From toilet/BSC: Max A (lift and lower assist);1-Two helpers  FIM - Banker Devices: HOB elevated;Bed rails Bed/Chair Transfer: 3: Chair or  W/C > Bed: Mod A (lift or lower assist);3: Bed > Chair or W/C: Mod A (lift or lower assist);3: Supine > Sit: Mod A (lifting assist/Pt. 50-74%/lift 2 legs  FIM - Locomotion:  Wheelchair Distance: 75'; PTA pt able to propel w/c w/ B UE; able to propel this session w/ R UE/LE, attempted B LE but decreased proprioception/attention to L LE Locomotion: Wheelchair: 0: Activity did not occur FIM - Locomotion: Ambulation Locomotion: Ambulation: 0: Activity did not occur  Comprehension Comprehension Mode: Auditory Comprehension: 4-Understands basic 75 - 89% of the time/requires cueing 10 - 24% of the time  Expression Expression Mode: Verbal Expression: 4-Expresses basic 75 - 89% of the time/requires cueing 10 - 24% of the time. Needs helper to occlude trach/needs to repeat words.  Social Interaction Social Interaction: 4-Interacts appropriately 75 - 89% of the time - Needs redirection for appropriate language or to initiate interaction.  Problem Solving Problem Solving: 3-Solves basic 50 - 74% of the time/requires cueing 25 - 49% of the time  Memory Memory: 4-Recognizes or recalls 75 - 89% of the time/requires cueing 10 - 24% of the time   Medical Problem List and Plan:  1. 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 Status post type line embolization RICA aneurysm   -PRAFO LLE 2. DVT Prophylaxis/Anticoagulation: SCDs  3. Chronic headaches/Pain Management: continue prn tylenol.  4. Mood/Anxiety. Ativan 1 mg every 6 hours as needed. Paxil 50 mg daily--increase to 60  -encouraged pt to discuss emotions and issues with team 4. Neuropsych: This patient is not capable of making decisions on her own behalf.  5. Seizures: continue Keppra, and vimpat--dilantin resumed. Level today pending 6. Dysphagia: Dysphagia 2 nectar liquids. Tolerating fairly well at present--needs to improve intake 7. E. Coli UTI: follow up culture 8. Acute renal insufficiency. Latest creatinine 0.85. May continue IVF for now pending fluid intake  -would like for PO to be at least 1000cc daily on a regular basis before I stop IVF--still  not there yet  LOS (Days) 6 A FACE TO FACE EVALUATION WAS PERFORMED  Kymani Laursen T 05/11/2013 8:17 AM

## 2013-05-11 NOTE — Patient Care Conference (Signed)
Inpatient RehabilitationTeam Conference and Plan of Care Update Date: 05/10/2013   Time: 3:10 PM    Patient Name: [name]      Medical Record Number: [id number]  Date of Birth: [birth year] Sex: Female         Room/Bed: 4W18C/4W18C-01 Payor Info: Payor: MEDCOST / Plan: MEDCOST / Product Type: *No Product type* /    Admitting Diagnosis: R SAH  Admit Date/Time:  05/05/2013  3:22 PM Admission Comments: No comment available   Primary Diagnosis:  Subarachnoid hemorrhage Principal Problem: Subarachnoid hemorrhage  Patient Active Problem List   Diagnosis Date Noted  . Subarachnoid hemorrhage 05/05/2013  . SAH (subarachnoid hemorrhage) 04/21/2013  . Cerebral aneurysm, nonruptured 04/21/2013  . Protein-calorie malnutrition, severe 04/19/2013  . History of radiation therapy   . 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 Faith Rogue Social Worker Present: Amada Jupiter, LCSW Nurse Present: Carmie End, RN PT Present: Zerita Boers, PT;Bridgett Ripa, PT OT Present: Roney Mans, Loistine Chance, OT SLP Present: Feliberto Gottron, SLP PPS Coordinator present : Tora Duck, RN, CRRN;Becky Henrene Dodge, PT     Current Status/Progress Goal Weekly Team Focus  Medical   right sided ICH, spastic left hemiparesis, depression?.   improve mobility, rom, stamina to allow better activity tolerance  spasticity mgt, nutrition, IVF qhs   Bowel/Bladder   cont of bowel and bladder; on scheduled colace; uses bedpan at night  remain cont of bowel and bladder with min assist to toilet  progress to using [facility]   Swallow/Nutrition/ Hydration   Dys. 2 textures with nectar-thick liquids, Min-Mod A  Supervision with least restrictive diet  trials of thin liquids    ADL's   bathing-mod A; UB dressing-max A; LB dressing-max A; toilet transfers-mod/max A; toileting-tot A; decreased attention to left;  slight tone in LUE; decreased task initiation  bathing-supervision; UB dressing-supervision; LB dressing-min A; toilet transfers-supervision; toileting-min A  BADLs, transfers, attention to left; task initiation; LUE NMR   Mobility   (S)-min A for w/c propulsion, mod/max A for bed mobility, t/f bed<>w/c and t/f sit<>stand  Min A overall  attending to L side of envrionment/body, safety, functional transfers, functional endurance   Communication   Min-Mod A  Supervision       Safety/Cognition/ Behavioral Observations  Mod A  Min A  working memory, attention to left field of enviornment, awareness,    Pain   Intermittent headache relieved by PRN tylenol 650 mg  pain level less than 3/10  assess pain qshift and prn, medicate as needed   Skin   Scattered bruising bilateral arms; healing skin on buttock treated with barrier cream  No infection or new skin breakdown with min assist while in Rehab  assess skin qshift and prn; reposition while in bed and chair    Rehab Goals Patient on target to meet rehab goals: Yes *See Care Plan and progress notes for long and short-term goals.  Barriers to Discharge: home situation, ongoing left sided deficits will be long standing    Possible Resolutions to Barriers:  family ed, supervision at home. adaptive equipment    Discharge Planning/Teaching Needs:  home with family providing 24/7 assistance vs. SNF      Team Discussion:  Pt with slow gains and still +2 assistance.  Feel she will definitely require 24/7 assist upon d/c.  sw expressing concerns over amount of reliable care available at home.  Team feels snf  may be best d/c option - SW to follow up with pt.  Revisions to Treatment Plan:  Possible change in d/c plan to snf   Continued Need for Acute Rehabilitation Level of Care: The patient requires daily medical management by a physician with specialized training in physical medicine and rehabilitation for the following conditions: Daily direction  of a multidisciplinary physical rehabilitation program to ensure safe treatment while eliciting the highest outcome that is of practical value to the patient.: Yes Daily medical management of patient stability for increased activity during participation in an intensive rehabilitation regime.: Yes Daily analysis of laboratory values and/or radiology reports with any subsequent need for medication adjustment of medical intervention for : Post surgical problems;Neurological problems;Other  Blake Vetrano 05/11/2013, 2:02 PM

## 2013-05-11 NOTE — Progress Notes (Signed)
Physical Therapy Session Note  Patient Details  Name: [name] MRN: [id number] Date of Birth: [birth year]  Today's Date: 05/11/2013 Time: 1400-1500 Time Calculation (min): 60 min  Short Term Goals: Week 1:  PT Short Term Goal 1 (Week 1): Pt to perform bed mobility w/ min A, 75% of time and cues to attend to L UE/LE, 75% of time PT Short Term Goal 2 (Week 1): Pt to perform transfer bed<>w/c w/ min A, 75% of time PT Short Term Goal 3 (Week 1): Pt to propel w/c 100' w/ min A PT Short Term Goal 4 (Week 1): Pt to ambulate 10' w/ LRAD and mod A  Skilled Therapeutic Interventions/Progress Updates:  1:1. Pt received sitting in w/c, verbalized feeling fatigued and but agreeable to therapy. Pt also reporting 8/10 in head and L LE, RN made aware and in to provide  Focus this session on t/f sit<>stand, standing posture, dynamic standing balance and toileting. Pt able to propel w/c 100' w/ R UE/LE this session and min-mod A for steering and propulsion. Pt actively engaging R UE, however, req cues for consistent use of R LE. Pt able to t/f sit<>stand w/ mod A in// bars, pulling up w/ R UE. Therapist blocking L knee and providing tactile cues at trunk to promote more erect posture. Pt able to stand for >30sec, however, increased overall flexion on L side with fatigue. Pt able to perform shuffled step w/ R LE during standing. Trial standing w/ eva walker, pt req max A for t/f sit<>stand but req increased tactile cueing 2/2 fatigue. Transitioned to use of Huntley Dec plus for dynamic standing balance w/ emphasis on weight shift and reaching to R side to promote trunk extension and elongation on R side. Pt with good tolerance to use of Sara plus. Throughout tx session pt req increased time for processing and initiating single step commands. Pt assisted to toilet at end of session w/ use of Sara plus, total A for management of clothing. Pt sitting on toilet at end of session supported by Huntley Dec plus, nurse tech present to  take over.   8/10pain Therapy Documentation Precautions:  Precautions Precautions: Fall Precaution Comments: R UE hemiplegia, anxiety  Restrictions Weight Bearing Restrictions: No  See FIM for current functional status  Therapy/Group: Individual Therapy  Denzil Hughes 05/11/2013, 6:06 PM

## 2013-05-11 NOTE — Progress Notes (Signed)
PIV on R AC inflitrated; IV team paged and RN came and attempted 2 times but vein can not sustain the catheter. RN notified Jesusita Oka, PA-C on call, and instructed to hold IV fluids for now and a decision will be made in the morning by the MD team. PIV discontinued. Patient made aware of situation and agreed; now stable and resting.

## 2013-05-12 ENCOUNTER — Inpatient Hospital Stay (HOSPITAL_COMMUNITY): Payer: PRIVATE HEALTH INSURANCE | Admitting: Speech Pathology

## 2013-05-12 ENCOUNTER — Inpatient Hospital Stay (HOSPITAL_COMMUNITY): Payer: PRIVATE HEALTH INSURANCE

## 2013-05-12 ENCOUNTER — Encounter (HOSPITAL_COMMUNITY): Payer: PRIVATE HEALTH INSURANCE | Admitting: Occupational Therapy

## 2013-05-12 NOTE — Progress Notes (Signed)
Subjective/Complaints: Still appears depressed. Slow progress. Tearful A 12 point review of systems has been performed and if not noted above is otherwise negative.   Objective: Vital Signs: Blood pressure 134/82, pulse 81, temperature 97.4 F (36.3 C), temperature source Oral, resp. rate 18, height 5\' 4"  (1.626 m), weight 72.53 kg (159 lb 14.4 oz), last menstrual period 08/26/2010, SpO2 99.00%. No results found. No results found for this basename: WBC, HGB, HCT, PLT,  in the last 72 hours  Recent Labs  05/10/13 0705  NA 138  K 4.1  CL 106  GLUCOSE 109*  BUN 22  CREATININE 0.91  CALCIUM 8.5   CBG (last 3)  No results found for this basename: GLUCAP,  in the last 72 hours  Wt Readings from Last 3 Encounters:  05/05/13 72.53 kg (159 lb 14.4 oz)  04/18/13 61.961 kg (136 lb 9.6 oz)  04/18/13 61.961 kg (136 lb 9.6 oz)    Physical Exam:  Vitals reviewed.  Eyes:  Pupils reactive to light  Neck: Normal range of motion. Neck supple. No thyromegaly present.  Cardiovascular: Normal rate and regular rhythm. No murmurs Respiratory: Effort normal and breath sounds normal. No respiratory distress. No wheezes  GI: Soft. Bowel sounds are normal. She exhibits no distension.  Musculoskeletal: She exhibits no pain with shoulder range of motion , no calf or thigh tenderness to palpation or swelling   Neurological:  Patient is alert. She was able to provide her name and place. Speech was dysarthric but intelligible. Delayed processing. fair insight and awareness  4 minus/5 in the right deltoid, bicep, tricep, grip, hip flexor, knee extensors, ankle dorsi flexion plantar flexor  Left upper extremity has 1+ pec, trace bicep, deltoid, tricep, 0 at hand Left lower extremity has 2 minus hip extension, 1 HAD, HAB, tr KE, 0 distally.  Absent sensation to light touch in the left upper and left lower extremity. Normal sensation to light touch in the right upper and right lower extremity. 1/4 tone  LUE and LLE. Left heel cord tight Psych: appears very flat  Assessment/Plan: 1. 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 Physiatrist is providing close team supervision and 24 hour management of active medical problems listed below. Physiatrist and rehab team continue to assess barriers to discharge/monitor patient progress toward functional and medical goals.  Pursuing SNF given safety and dispo situation.   FIM: FIM - Bathing Bathing Steps Patient Completed: Chest;Left Arm;Abdomen;Right upper leg;Left upper leg Bathing: 3: Mod-Patient completes 5-7 2f 10 parts or 50-74%  FIM - Upper Body Dressing/Undressing Upper body dressing/undressing steps patient completed: Put head through opening of pull over shirt/dress Upper body dressing/undressing: 2: Max-Patient completed 25-49% of tasks FIM - Lower Body Dressing/Undressing Lower body dressing/undressing steps patient completed: Thread/unthread left pants leg Lower body dressing/undressing: 1: Two helpers  FIM - Toileting Toileting steps completed by patient: Performs perineal hygiene Toileting Assistive Devices: Grab bar or rail for support Toileting: 2: Max-Patient completed 1 of 3 steps  FIM - Diplomatic Services operational officer Devices: Psychiatrist Transfers: 1-Mechanical lift  FIM - Banker Devices: Arm rests Bed/Chair Transfer: 3: Chair or W/C > Bed: Mod A (lift or lower assist);3: Bed > Chair or W/C: Mod A (lift or lower assist)  FIM - Locomotion: Wheelchair Distance: 75'; PTA pt able to propel w/c w/ B UE; able to propel  this session w/ R UE/LE, attempted B LE but decreased proprioception/attention to L LE Locomotion: Wheelchair: 2: Travels 50 - 149 ft with moderate assistance (Pt: 50 - 74%) FIM - Locomotion:  Ambulation Locomotion: Ambulation: 0: Activity did not occur  Comprehension Comprehension Mode: Auditory Comprehension: 4-Understands basic 75 - 89% of the time/requires cueing 10 - 24% of the time  Expression Expression Mode: Verbal Expression: 4-Expresses basic 75 - 89% of the time/requires cueing 10 - 24% of the time. Needs helper to occlude trach/needs to repeat words.  Social Interaction Social Interaction: 4-Interacts appropriately 75 - 89% of the time - Needs redirection for appropriate language or to initiate interaction.  Problem Solving Problem Solving: 3-Solves basic 50 - 74% of the time/requires cueing 25 - 49% of the time  Memory Memory: 4-Recognizes or recalls 75 - 89% of the time/requires cueing 10 - 24% of the time   Medical Problem List and Plan:  1. 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 Status post type line embolization RICA aneurysm   -PRAFO LLE 2. DVT Prophylaxis/Anticoagulation: SCDs  3. Chronic headaches/Pain Management: continue prn tylenol.  4. Mood/Anxiety. Ativan 1 mg every 6 hours as needed. Paxil 50 mg daily--increase to 62.5  -encouraged pt to discuss emotions and issues with team 4. Neuropsych: This patient is not capable of making decisions on her own behalf.  5. Seizures: continue Keppra, and vimpat--dilantin resumed, level decreasing but still therapeutic given alb. 6. Dysphagia: Dysphagia 2 nectar liquids. Tolerating fairly well at present--needs to improve intake 7. E. Coli UTI: follow up culture 8. Acute renal insufficiency. Latest creatinine 0.85. May continue IVF for now pending fluid intake  -would like for PO to be at least 1000cc daily on a regular basis before I stop IVF--still not there yet  -po appears to be declining if anything--add remeron for mood/appetite  LOS (Days) 7 A FACE TO FACE EVALUATION WAS PERFORMED  SWARTZ,ZACHARY T 05/12/2013 8:26 AM

## 2013-05-12 NOTE — Progress Notes (Signed)
Occupational Therapy Session Note  Patient Details  Name: [name] MRN: [id number] Date of Birth: December 18, 1960  Today's Date: 05/12/2013 Time: 3086-5784 Time Calculation (min): 60 min  Short Term Goals: Week 1:  OT Short Term Goal 1 (Week 1): Pt will don shirt with mod A  OT Short Term Goal 2 (Week 1): Pt will transfer with max A +1 to BSC/ toilet OT Short Term Goal 3 (Week 1): Pt will participate in left UE proper positioning for decr swelling with mod A  OT Short Term Goal 4 (Week 1): Pt will perform sit to stand with max A for clothing management  Skilled Therapeutic Interventions/Progress Updates:    1:1 self care retraining with focus on dressing using hemi techniques with max cuing. Continued focus on postural control in head, shoulders, trunk and hips in static and dynamic sitting, sit to stand, standing balance with mirror for visual feedback, weight shifting to the right, postural adjustments with questioning and tactile cues in room at the sink and in the gym sitting unsupported on the mat. Pt required max verbal and tactile cuing for maintaining midline posture for 5-10 seconds at a time. Functional reach in the right field with focus on weight shift and active trunk rotation. Trunk control and strengthening coming into side lying propped on elbow on right and then weight shifting to prop on the left UE (weight bearing) and then back with tactile cuing. Pt continues to present with slow motorical responses and initiation .   Therapy Documentation Precautions:  Precautions Precautions: Fall Precaution Comments: R UE hemiplegia, anxiety  Restrictions Weight Bearing Restrictions: No Pain:  no c/o pain   See FIM for current functional status  Therapy/Group: Individual Therapy  Roney Mans [facility] 05/12/2013, 12:00 PM

## 2013-05-12 NOTE — Progress Notes (Signed)
Physical Therapy Session Note  Patient Details  Name: [name] MRN: [id number] Date of Birth: 18-Feb-1961  Today's Date: 05/12/2013 Time: 8295-6213 Time Calculation (min): 55 min  Short Term Goals: Week 1:  PT Short Term Goal 1 (Week 1): Pt to perform bed mobility w/ min A, 75% of time and cues to attend to L UE/LE, 75% of time PT Short Term Goal 2 (Week 1): Pt to perform transfer bed<>w/c w/ min A, 75% of time PT Short Term Goal 3 (Week 1): Pt to propel w/c 100' w/ min A PT Short Term Goal 4 (Week 1): Pt to ambulate 10' w/ LRAD and mod A  Skilled Therapeutic Interventions/Progress Updates:  1:1. Pt received supine in bed sleeping, able to wake w/ min verbal/tactile cues. Pt found to be incontinent and verbalized "yes" when questioned if she needed to use the bathroom (again). Pt req mod A for t/f sup>sit EOB w/ HOB elevated and use of bed rail. Use of Sara Plus for t/f bed>toilet, pt able to void once sitting on toilet. Req Ax2 and increased time for clean up. RN present to address pt's 8/10 head pain. Pt positioned in w/c w/ use of Sara plus, w/ B LE elevated for edema management, quick release belt in place. Pt set up to eat breakfast w/ supervision initially provided by physical therapist until speech therapist took over. While eating, therapist providing cues for scanning to L side of tray for silverware, L anterior spillage as well as L side pocketing.  LTGs modified due to anticipated d/c to SNF.   Therapy Documentation Precautions:  Precautions Precautions: Fall Precaution Comments: R UE hemiplegia, anxiety  Restrictions Weight Bearing Restrictions: No  See FIM for current functional status  Therapy/Group: Individual Therapy  Denzil Hughes 05/12/2013, 12:44 PM

## 2013-05-12 NOTE — Progress Notes (Signed)
Occupational Therapy Note  Patient Details  Name: [name] MRN: [id number] Date of Birth: [birth year] Today's Date: 05/12/2013  Time: 1200-1230 (cotx with Speech therapy-total time 1130-1230) Pt denies pain Group Therapy  Pt engaged in self feeding group with focus on sitting balance and attention to left.  Pt requires mod verbal cues to correct sitting balance during self feeding.  Pt requires mod verbal cues to wipe mouth when there is some spillage on her cheek.  Pt requires extra time to complete tasks and perseverates on scooping food from plate when plate is empty.   Lavone Neri [facility] 05/12/2013, 3:21 PM

## 2013-05-12 NOTE — Progress Notes (Signed)
Speech Language Pathology Daily Session Notes  Patient Details  Name: [name] MRN: [id number] Date of Birth: [birth year]  Today's Date: 05/12/2013  Session 1 Time: 4098-1191 Time Calculation (min): 45 min  Session 2 Time: 1130-1200 Time Calculation: 30 min  Short Term Goals: Week 1: SLP Short Term Goal 1 (Week 1): Pt will utilize swallowing compensatory strategies to minimize overt s/s of aspiration with Min A verbal and question cues.  SLP Short Term Goal 2 (Week 1): Pt will attend sustain attention to a functional task for 30 minutes with Min A verbal cues for redirection  SLP Short Term Goal 3 (Week 1): Pt will utilize external memory aids to recall new, daily information with Min A question and visual cues.  SLP Short Term Goal 4 (Week 1): Pt will demonstrate functional problem solving for basic and familiar tasks with Min A multimodal cueing  SLP Short Term Goal 5 (Week 1): Pt will utilize call bell to request assistance with Min verbal and question cues. SLP Short Term Goal 6 (Week 1): Pt will utilize speech intelligibility strategies at the phrase level with supervision verbal cues.   Skilled Therapeutic Interventions:  Session 1: Treatment focus on dysphagia and cognitive goals. Upon arrival, pt up in chair consuming breakfast meal. SLP facilitated session by providing Min A verbal and question cues to attend to left pocketing and anterior spillage throughout the meal. Pt without overt s/s of aspiration. SLP also facilitated session by providing pt a with a larger print schedule to encourage use of external memory aids to recall new, daily information. Pt required Mod question cues to recall events from previous therapy sessions. Continue plan of care.    Session 2: Pt participated on co-treatment with OT in diners club with focus on self-feeding and dysphagia goals. Pt consumed Dys. 2 textures with nectar-thick liquids via straw with cough X 1, suspect due to large bites. Pt  required Mod verbal cues for utilization of small bites and to self-monitor and correct left pocketing and anterior spillage. Pt with increased fatigue throughout the meal and required extra time for consumption. Continue plan of care.   FIM:  Comprehension Comprehension Mode: Auditory Comprehension: 4-Understands basic 75 - 89% of the time/requires cueing 10 - 24% of the time Expression Expression Mode: Verbal Expression: 3-Expresses basic 50 - 74% of the time/requires cueing 25 - 50% of the time. Needs to repeat parts of sentences. Social Interaction Social Interaction: 3-Interacts appropriately 50 - 74% of the time - May be physically or verbally inappropriate. Problem Solving Problem Solving: 3-Solves basic 50 - 74% of the time/requires cueing 25 - 49% of the time Memory Memory: 3-Recognizes or recalls 50 - 74% of the time/requires cueing 25 - 49% of the time FIM - Eating Eating Activity: 5: Supervision/cues;5: Set-up assist for open containers;5: Needs verbal cues/supervision;4: Helper checks for pocketed food  Pain Pain Assessment Pain Assessment: No/denies pain  Therapy/Group: Individual Therapy and Group Therapy  Landen Knoedler 05/12/2013, 1:42 PM

## 2013-05-13 ENCOUNTER — Inpatient Hospital Stay (HOSPITAL_COMMUNITY): Payer: PRIVATE HEALTH INSURANCE | Admitting: Speech Pathology

## 2013-05-13 ENCOUNTER — Inpatient Hospital Stay (HOSPITAL_COMMUNITY): Payer: PRIVATE HEALTH INSURANCE | Admitting: Occupational Therapy

## 2013-05-13 ENCOUNTER — Inpatient Hospital Stay (HOSPITAL_COMMUNITY): Payer: PRIVATE HEALTH INSURANCE

## 2013-05-13 MED ORDER — BISACODYL 10 MG RE SUPP
10.0000 mg | Freq: Every day | RECTAL | Status: DC | PRN
  Administered 2013-05-13 – 2013-05-19 (×3): 10 mg via RECTAL
  Filled 2013-05-13 (×3): qty 1

## 2013-05-13 MED ORDER — BACLOFEN 5 MG HALF TABLET
5.0000 mg | ORAL_TABLET | Freq: Two times a day (BID) | ORAL | Status: DC
  Administered 2013-05-13: 5 mg via ORAL
  Administered 2013-05-13: 13:00:00 via ORAL
  Administered 2013-05-14 – 2013-05-18 (×9): 5 mg via ORAL
  Administered 2013-05-19: 13:00:00 via ORAL
  Administered 2013-05-19 – 2013-05-20 (×2): 5 mg via ORAL
  Filled 2013-05-13 (×17): qty 1

## 2013-05-13 NOTE — Progress Notes (Addendum)
Physical Therapy Weekly Progress Note  Patient Details  Name: [name] MRN: [id number] Date of Birth: 08-09-1960  Today's Date: 05/13/2013 Time: 1400-1500 Time Calculation (min): 60 min  Patient has met 0 of 4 short term goals due to inconsistent and slow progress since time of evaluation. Pt currently req mod-max A for bed mobility, squat pivot transfers, w/c propulsion as well as static/dynamic sitting and standing. Pt unable to progress to pre-gait activities at this time. Pt demonstrates decreased orientation to midline at level of both head and trunk that exacerbates w/ fatigue in both sitting and standing req max cueing for correction, but pt unable to sustain. Pt req significantly increased time for processing and initiating simple single one step commands, problem solving, sustained attention, attention to L side of environment and intellectual awareness w/ max multimodal cueing. Pt benefits from consistent positive encouragement for continued participation in therapy sessions as pt often states, "I didn't do anything" or "I didn't do it well."  Patient continues to demonstrate the following deficits: decreased functional endurance, decreased strength, L hemiplegia, decreased balance, impaired timing and sequencing, abnormal tone, motor apraxia, decreased coordination, decreased motor planning, decreased orientation to midline, decreased initiation, decreased awareness, decreased problem solving, decreased memory, delayed processing and therefore will continue to benefit from skilled PT intervention to enhance overall performance with the identified impairments.  Patient not progressing toward long term goals.  See goal revision..  Plan of care revisions: Pt's LTGs downgraded due to anticipated d/c to SNF vs. home environment.  PT Short Term Goals Week 1:  PT Short Term Goal 1 (Week 1): Pt to perform bed mobility w/ min A, 75% of time and cues to attend to L UE/LE, 75% of time PT Short  Term Goal 1 - Progress (Week 1): Not met PT Short Term Goal 2 (Week 1): Pt to perform transfer bed<>w/c w/ min A, 75% of time PT Short Term Goal 2 - Progress (Week 1): Not met PT Short Term Goal 3 (Week 1): Pt to propel w/c 100' w/ min A PT Short Term Goal 3 - Progress (Week 1): Not met PT Short Term Goal 4 (Week 1): Pt to ambulate 10' w/ LRAD and mod A PT Short Term Goal 4 - Progress (Week 1): Not met Week 2:  PT Short Term Goal 1 (Week 2): Pt will perform bed mobility w/ mod A and use of hospital bed functions PT Short Term Goal 2 (Week 2): Pt will sustain midline positioning during dynamic sitting tasks w/ min A and mod multimodal cues PT Short Term Goal 3 (Week 2): Pt to perform squat pivot t/f bed<>w/c w/ mod A PT Short Term Goal 4 (Week 2): Pt to propel w/c w/ R UE/LE x100' w/ min A and mod multimodal cues  Skilled Therapeutic Interventions/Progress Updates:  1:1. Pt received supine in bed sleeping, req mod verbal/tactile cues to wake and max encouragement to open eyes. Pt req max A for t/f sup>sit EOB w/ use of bed rails, chux pad and HOB elevated. Pt attempting to lie back down and verbalized, "I don't want to do therapy." After mod encouragement, pt agreeable to try and go to the bathroom. [place] Dec plus utilized for t/f bed>toilet>w/c, pt req total A x1-2 persons for management of clothing. Pt felt that she needed to have a BM, but only able to pass air. Once in w/c, pt easily able to be redirected for further participation in physical therapy. Focus on w/c propulsion as well as static/dynamic  sitting and standing balance. Pt able to propel w/c 100'x2 w/ R UE/LE and mod-max A for propulsion and steering as well as max verbal cues for seq. Pt performed multiple bouts of standing (<30sec) in hallway w/ use of R rail and therapist blocking L knee flexion and facilitating trunk extension, req min-mod A for t/f sit<>stand but mod-max A once in standing. In both sitting and unsupported standing, pt  demonstrating consistent head turn to R and L lean that exacerbates w/ fatigue, req max cueing for correction. Pt demonstrates intellectual awareness of L lean, but does not initiate self-correction. When cued to scan for objects to upper L, pt demonstrating improved upright head/trunk posture as well as more even WB in both sitting and standing, but unable to sustain. Pt req max cueing to scan to objects on L side. Pt req max A for squat pivot t/f w/c>bed and mod A for t/f sit>sup and repositioning in bed w/ use of hospital bed functions. Pt semi-reclined in bed at end of session w/ bed alarm on and all needs in reach.   Therapy Documentation Precautions:  Precautions Precautions: Fall Precaution Comments: R UE hemiplegia, anxiety  Restrictions Weight Bearing Restrictions: No  See FIM for current functional status  Therapy/Group: Individual Therapy  Denzil Hughes 05/13/2013, 4:46 PM

## 2013-05-13 NOTE — Progress Notes (Signed)
Subjective/Complaints: Slow progress. Sometimes difficult to engage. Tearful at times still.  A 12 point review of systems has been performed and if not noted above is otherwise negative.   Objective: Vital Signs: Blood pressure 148/99, pulse 60, temperature 98.6 F (37 C), temperature source Oral, resp. rate 18, height 5\' 4"  (1.626 m), weight 72.53 kg (159 lb 14.4 oz), last menstrual period 08/26/2010, SpO2 96.00%. No results found. No results found for this basename: WBC, HGB, HCT, PLT,  in the last 72 hours No results found for this basename: NA, K, CL, CO, GLUCOSE, BUN, CREATININE, CALCIUM,  in the last 72 hours CBG (last 3)  No results found for this basename: GLUCAP,  in the last 72 hours  Wt Readings from Last 3 Encounters:  05/05/13 72.53 kg (159 lb 14.4 oz)  04/18/13 61.961 kg (136 lb 9.6 oz)  04/18/13 61.961 kg (136 lb 9.6 oz)    Physical Exam:  Vitals reviewed.  Eyes:  Pupils reactive to light  Neck: Normal range of motion. Neck supple. No thyromegaly present.  Cardiovascular: Normal rate and regular rhythm. No murmurs Respiratory: Effort normal and breath sounds normal. No respiratory distress. No wheezes  GI: Soft. Bowel sounds are normal. She exhibits no distension.  Musculoskeletal: She exhibits no pain with shoulder range of motion , no calf or thigh tenderness to palpation or swelling   Neurological:  Patient is alert. She was able to provide her name and place. Speech was dysarthric but intelligible. Delayed processing. fair insight and awareness  4 minus/5 in the right deltoid, bicep, tricep, grip, hip flexor, knee extensors, ankle dorsi flexion plantar flexor  Left upper extremity has 1+ pec, trace bicep, deltoid, tricep, 0 at hand Left lower extremity has 2 minus hip extension, 1 HAD, HAB, tr KE, 0 distally.  Absent sensation to light touch in the left upper and left lower extremity. Normal sensation to light touch in the right upper and right lower  extremity. 2/4 tone Left pecs and LLE 1-2/4. Left heel cord remains tight Psych: appears very flat  Assessment/Plan: 1. 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 Physiatrist is providing close team supervision and 24 hour management of active medical problems listed below. Physiatrist and rehab team continue to assess barriers to discharge/monitor patient progress toward functional and medical goals.  Pursuing SNF given safety and dispo situation.   FIM: FIM - Bathing Bathing Steps Patient Completed: Chest;Left Arm;Abdomen;Right upper leg;Left upper leg Bathing: 3: Mod-Patient completes 5-7 3f 10 parts or 50-74%  FIM - Upper Body Dressing/Undressing Upper body dressing/undressing steps patient completed: Put head through opening of pull over shirt/dress Upper body dressing/undressing: 2: Max-Patient completed 25-49% of tasks FIM - Lower Body Dressing/Undressing Lower body dressing/undressing steps patient completed: Thread/unthread left pants leg Lower body dressing/undressing: 1: Two helpers  FIM - Toileting Toileting steps completed by patient: Performs perineal hygiene Toileting Assistive Devices:  (use of Sara plus) Toileting: 1: Two helpers  FIM - Diplomatic Services operational officer Devices: Psychiatrist Transfers: 1-Mechanical lift  FIM - Banker Devices: Bed rails;HOB elevated Bed/Chair Transfer: 1: Mechanical lift;3: Supine > Sit: Mod A (lifting assist/Pt. 50-74%/lift 2 legs  FIM - Locomotion: Wheelchair Distance: 75'; PTA pt able to propel w/c w/ B UE; able to propel this session w/ R UE/LE, attempted B LE but decreased proprioception/attention to L LE Locomotion:  Wheelchair: 0: Activity did not occur FIM - Locomotion: Ambulation Locomotion: Ambulation: 0: Activity did not  occur  Comprehension Comprehension Mode: Auditory Comprehension: 4-Understands basic 75 - 89% of the time/requires cueing 10 - 24% of the time  Expression Expression Mode: Verbal Expression: 3-Expresses basic 50 - 74% of the time/requires cueing 25 - 50% of the time. Needs to repeat parts of sentences.  Social Interaction Social Interaction: 3-Interacts appropriately 50 - 74% of the time - May be physically or verbally inappropriate.  Problem Solving Problem Solving: 3-Solves basic 50 - 74% of the time/requires cueing 25 - 49% of the time  Memory Memory: 3-Recognizes or recalls 50 - 74% of the time/requires cueing 25 - 49% of the time   Medical Problem List and Plan:  1. 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 Status post type line embolization RICA aneurysm   -PRAFO LLE 2. DVT Prophylaxis/Anticoagulation: SCDs  3. Chronic headaches/Pain Management: continue prn tylenol.  4. Mood/Anxiety. Ativan 1 mg every 6 hours as needed. Paxil 50 mg daily--increase to 62.5  -encouraged pt to discuss emotions and issues with team 4. Neuropsych: This patient is not capable of making decisions on her own behalf.  5. Seizures: continue Keppra, and vimpat--dilantin resumed, level decreasing but still therapeutic given alb. 6. Dysphagia: Dysphagia 2 nectar liquids. Tolerating fairly well at present--needs to improve intake 7. E. Coli UTI: follow up culture 8. Acute renal insufficiency. Latest creatinine 0.85. May continue IVF for now pending fluid intake  -would like for PO to be at least 1000cc daily on a regular basis before I stop IVF--still not there yet  -po appears to be declining if anything--add remeron for mood/appetite 9. Spasticity: appears to be increasing. Initiate low dose baclofen 5mg  bid to start  LOS (Days) 8 A FACE TO FACE EVALUATION WAS PERFORMED  [name] T 05/13/2013 9:38 AM

## 2013-05-13 NOTE — Progress Notes (Signed)
Speech Language Pathology Weekly Progress & Session Notes  Patient Details  Name: [name] MRN: [id number] Date of Birth: 10-09-1960  Today's Date: 05/13/2013  Session 1 Time: 1000-1045 Time Calculation (min): 45 min  Session 2 Time: 1130-1200 Time Calculation: 30 min  Short Term Goals: Week 1: SLP Short Term Goal 1 (Week 1): Pt will utilize swallowing compensatory strategies to minimize overt s/s of aspiration with Min A verbal and question cues.  SLP Short Term Goal 1 - Progress (Week 1): Not met SLP Short Term Goal 2 (Week 1): Pt will attend sustain attention to a functional task for 30 minutes with Min A verbal cues for redirection  SLP Short Term Goal 2 - Progress (Week 1): Not met SLP Short Term Goal 3 (Week 1): Pt will utilize external memory aids to recall new, daily information with Min A question and visual cues.  SLP Short Term Goal 3 - Progress (Week 1): Not met SLP Short Term Goal 4 (Week 1): Pt will demonstrate functional problem solving for basic and familiar tasks with Min A multimodal cueing  SLP Short Term Goal 4 - Progress (Week 1): Not met SLP Short Term Goal 5 (Week 1): Pt will utilize call bell to request assistance with Min verbal and question cues. SLP Short Term Goal 5 - Progress (Week 1): Not met SLP Short Term Goal 6 (Week 1): Pt will utilize speech intelligibility strategies at the phrase level with supervision verbal cues.  SLP Short Term Goal 6 - Progress (Week 1): Met  New Short-Term Goals:  Week 2: SLP Short Term Goal 1 (Week 2): Pt will utilize swallowing compensatory strategies to minimize overt s/s of aspiration with Min A verbal and question cues.  SLP Short Term Goal 2 (Week 2): Pt will utilize external memory aids to recall new, daily information with Mod A question and visual cues.  SLP Short Term Goal 3 (Week 2): Pt will demonstrate functional problem solving for basic and familiar tasks with Mod A multimodal cueing  SLP Short Term Goal 4  (Week 2): Pt will utilize call bell to request assistance with Mod verbal and question cues. SLP Short Term Goal 5 (Week 2): Pt will attend sustain attention to a functional task for 30 minutes with Mod A verbal cues for redirection  SLP Short Term Goal 6 (Week 2): Pt will utilize speech intelligibility strategies at the phrase level with Mod I.   Weekly Progress Updates: Pt has made small, inconsistent gains and has met 1 of 6 STG's this reporting period. Currently, pt is consuming Dys. 2 textures with nectar-thick liquids and requires Min-Mod multimodal cueing for utilization of swallowing compensatory strategies throughout the meal. Pt also requires overall Mod-Max A for functional problem solving, initiation, safety awareness, intellectual awareness, working memory and sustained attention and attention to left field of enviornment. She also requires extra time to perform all tasks and can become labile during functional tasks.  Pt's overall speech intelligibility has improved but can be impacted by overall fatigue.  Pt would benefit from continued skilled SLP intervention to maximize cognitive and swallowing function to maximize her overall independence prior to discharge. Pt's discharge plan has now changed to SNF placement at this time, therefore, LTG's will be downgraded at this time.   Daily Session Skilled Therapeutic Intervention:  Session 1: Treatment focus on cognitive goals. SLP facilitated session with Mod verbal cues and extra time for sorting task from field of four (pt required 30 minutes to sort 15  coins into 4 different groups). Pt also required total A for counting change. Pt was extremely fatigued and reported she was tired throughout the session. Pt was transferred back to bed at end of session.   Session 2: Pt participated on co-treatment with OT in [employer] with focus on self-feeding and dysphagia goals. Pt consumed Dys. 2 textures with nectar-thick liquids via straw without  overt s/s of aspiration and required supervision verbal cues to self-monitor and correct left pocketing and anterior spillage. Pt lethargic throughout meal and demonstrated decreased PO intake. Pt also required total A for initiation of self-feeding and for functional problem solving with task due to inattention. Continue plan of care.   FIM:  Comprehension Comprehension Mode: Auditory Comprehension: 3-Understands basic 50 - 74% of the time/requires cueing 25 - 50%  of the time Expression Expression: 3-Expresses basic 50 - 74% of the time/requires cueing 25 - 50% of the time. Needs to repeat parts of sentences. Social Interaction Social Interaction: 2-Interacts appropriately 25 - 49% of time - Needs frequent redirection. Problem Solving Problem Solving: 2-Solves basic 25 - 49% of the time - needs direction more than half the time to initiate, plan or complete simple activities Memory Memory: 2-Recognizes or recalls 25 - 49% of the time/requires cueing 51 - 75% of the time FIM - Eating Eating Activity: 5: Supervision/cues;5: Set-up assist for open containers;5: Needs verbal cues/supervision;4: Helper checks for pocketed food Pain No/Denies Pain  Therapy/Group: Individual Therapy and Group Therapy  Kieffer Blatz 05/13/2013, 1:08 PM

## 2013-05-13 NOTE — Progress Notes (Signed)
Occupational Therapy Note  Patient Details  Name: CARESS REFFITT MRN: [id number] Date of Birth: 12/21/1960 Today's Date: 05/13/2013  Time: 1200-1225 (cotx with Speech therapy-total time 1130-1225) Pt denies pain Group Therapy  Pt engaged in self feeding group with focus on sitting balance and attention to left.  Pt requires mod verbal cues to correct sitting balance during self feeding.  Pt requires mod verbal cues to wipe mouth when there is some spillage on her cheek.  Pt requires extra time to complete tasks and perseverates on scooping food from plate when plate is empty Pt was much more lethargic today and required increased verbal cues to initiate tasks.   Lavone Neri [facility] 05/13/2013, 2:58 PM

## 2013-05-13 NOTE — Progress Notes (Signed)
Occupational Therapy Weekly Progress Note  Patient Details  Name: [name] MRN: [id number] Date of Birth: Nov 30, 1960  Today's Date: 05/13/2013 Time: 1130-1200 Time Calculation (min): 30 min  Patient has met 0 of 4 short term goals.  Pt with inconsistent and slow progress sine time of evaluation. Pt squat pivot transfers have been inconsistent; varying from mod to total A +2.  Pt requires total A for dressing and at times +2 for sit to stands for clothing management. Pt continues to be incontinent of urine and bowel with decreased initiation to use call bell to state her needs. Pt continues to be tearful at times requiring redirection often. Pt also requires overall Max A for functional problem solving, initiation, safety awareness, intellectual awareness, working memory and sustained attention and attention to left field of enviornment. She also requires extra time to perform all tasks and can become labile during functional tasks.   Patient continues to demonstrate the following deficits: muscle weakness and muscle joint tightness, decreased cardiorespiratoy endurance, impaired timing and sequencing, abnormal tone, unbalanced muscle activation, motor apraxia, decreased coordination and decreased motor planning, decreased visual perceptual skills, decreased midline orientation, decreased attention to right and decreased motor planning, decreased initiation, decreased attention, decreased awareness, decreased problem solving, decreased safety awareness, decreased memory and delayed processing and decreased sitting balance, decreased standing balance, decreased postural control, hemiplegia, decreased balance strategies and difficulty maintaining precautions and therefore will continue to benefit from skilled OT intervention to enhance overall performance with BADL and Reduce care partner burden.  Patient not progressing toward long term goals.  See goal revision..  Continue plan of care.  OT  Short Term Goals Week 1:  OT Short Term Goal 1 (Week 1): Pt will don shirt with mod A  OT Short Term Goal 1 - Progress (Week 1): Not met OT Short Term Goal 2 (Week 1): Pt will transfer with max A +1 to BSC/ toilet OT Short Term Goal 2 - Progress (Week 1): Progressing toward goal OT Short Term Goal 3 (Week 1): Pt will participate in left UE proper positioning for decr swelling with mod A  OT Short Term Goal 3 - Progress (Week 1): Not met OT Short Term Goal 4 (Week 1): Pt will perform sit to stand with max A for clothing management OT Short Term Goal 4 - Progress (Week 1): Not met Week 2:  OT Short Term Goal 1 (Week 2): Pt will transfer with max A +1 consistently  OT Short Term Goal 2 (Week 2): Pt will initiate during a functional task within 3 seconds to perform self care task OT Short Term Goal 3 (Week 2): Pt will demonstrated sustained attention for 10 min with mod multimodal cues OT Short Term Goal 4 (Week 2): Pt will use call bell for toileting needs with max multimodal cues  Skilled Therapeutic Interventions/Progress Updates:    1:1 self care retraining with focus on dressing only. Pt continues to be tearful and fearful of cold water. Pt in bed when arrived and had been incontinent of urine notably multiple times (pt unaware- just reported she was cold). Pt required total multimodal cuing to initiate coming to the EOB to transition into the w/c. Pt required +2 total A (pt 20%) to perform squat pivot transfer into the w/c. Pt requested to sit on the toilet and did initiate the transfer to toilet. Max cuing for positioning initially - but then with right forearm and elbow on grab bar pt able to maintain sitting position  on toilet with close supervision to minA. However once back in the w/c (supported) pt with heavy leaning towards the left requires max cuing to obtain and maintain midline posture. Pt with decr initiation, problem solving and difficulty following one step directions regarding hemi  dressing techniques to don shirt. Pt with increased pushing towards weak side with sit to stand for clothing management, requiring total A +2 (pt 10%) for standing balance and decr ability to perform weight shifting compared to yesterday with multimodal cues.  Pt more delayed with verbal and motorical responses today.     Therapy Documentation Precautions:  Precautions Precautions: Fall Precaution Comments: R UE hemiplegia, anxiety  Restrictions Weight Bearing Restrictions: No General: Pain:  no c/o pain in session   See FIM for current functional status  Therapy/Group: Individual Therapy  Roney Mans [facility] 05/13/2013, 2:26 PM

## 2013-05-14 ENCOUNTER — Inpatient Hospital Stay (HOSPITAL_COMMUNITY): Payer: PRIVATE HEALTH INSURANCE | Admitting: Speech Pathology

## 2013-05-14 ENCOUNTER — Inpatient Hospital Stay (HOSPITAL_COMMUNITY): Payer: PRIVATE HEALTH INSURANCE

## 2013-05-14 DIAGNOSIS — I609 Nontraumatic subarachnoid hemorrhage, unspecified: Secondary | ICD-10-CM

## 2013-05-14 DIAGNOSIS — G8100 Flaccid hemiplegia affecting unspecified side: Secondary | ICD-10-CM

## 2013-05-14 LAB — URINALYSIS, ROUTINE W REFLEX MICROSCOPIC
Bilirubin Urine: NEGATIVE
Glucose, UA: NEGATIVE mg/dL
Ketones, ur: NEGATIVE mg/dL
Nitrite: NEGATIVE
Protein, ur: NEGATIVE mg/dL
Specific Gravity, Urine: 1.006 (ref 1.005–1.030)
Urobilinogen, UA: 0.2 mg/dL (ref 0.0–1.0)

## 2013-05-14 LAB — COMPREHENSIVE METABOLIC PANEL
ALT: 35 U/L (ref 0–35)
AST: 41 U/L — ABNORMAL HIGH (ref 0–37)
Albumin: 2.6 g/dL — ABNORMAL LOW (ref 3.5–5.2)
BUN: 23 mg/dL (ref 6–23)
CO2: 24 mEq/L (ref 19–32)
Calcium: 8.6 mg/dL (ref 8.4–10.5)
Creatinine, Ser: 1.02 mg/dL (ref 0.50–1.10)
GFR calc non Af Amer: 61 mL/min — ABNORMAL LOW (ref >90)

## 2013-05-14 LAB — URINE MICROSCOPIC-ADD ON

## 2013-05-14 LAB — CBC WITH DIFFERENTIAL/PLATELET
Eosinophils Absolute: 0.1 10*3/uL (ref 0.0–0.7)
Hemoglobin: 9.5 g/dL — ABNORMAL LOW (ref 12.0–15.0)
Lymphocytes Relative: 16 % (ref 12–46)
Lymphs Abs: 0.7 10*3/uL (ref 0.7–4.0)
MCH: 32 pg (ref 26.0–34.0)
MCV: 91.6 fL (ref 78.0–100.0)
Monocytes Absolute: 0.3 10*3/uL (ref 0.1–1.0)
Monocytes Relative: 7 % (ref 3–12)
Neutrophils Relative %: 76 % (ref 43–77)
RBC: 2.97 MIL/uL — ABNORMAL LOW (ref 3.87–5.11)
WBC: 4.7 10*3/uL (ref 4.0–10.5)

## 2013-05-14 MED ORDER — PAROXETINE HCL ER 37.5 MG PO TB24
62.5000 mg | ORAL_TABLET | Freq: Every day | ORAL | Status: DC
  Administered 2013-05-14: 62.5 mg via ORAL
  Filled 2013-05-14 (×3): qty 1

## 2013-05-14 MED ORDER — CIPROFLOXACIN IN D5W 400 MG/200ML IV SOLN
400.0000 mg | Freq: Two times a day (BID) | INTRAVENOUS | Status: DC
  Administered 2013-05-14 – 2013-05-16 (×4): 400 mg via INTRAVENOUS
  Filled 2013-05-14 (×7): qty 200

## 2013-05-14 NOTE — Progress Notes (Signed)
Speech Language Pathology Daily Session Note  Patient Details  Name: [name] MRN: [id number] Date of Birth: 10/06/1960  Today's Date: 05/14/2013 Time: 1215-1230 Time Calculation (min): 15 min  Short Term Goals: Week 1: SLP Short Term Goal 1 (Week 1): Pt will utilize swallowing compensatory strategies to minimize overt s/s of aspiration with Min A verbal and question cues.  SLP Short Term Goal 1 - Progress (Week 1): Not met SLP Short Term Goal 2 (Week 1): Pt will attend sustain attention to a functional task for 30 minutes with Min A verbal cues for redirection  SLP Short Term Goal 2 - Progress (Week 1): Not met SLP Short Term Goal 3 (Week 1): Pt will utilize external memory aids to recall new, daily information with Min A question and visual cues.  SLP Short Term Goal 3 - Progress (Week 1): Not met SLP Short Term Goal 4 (Week 1): Pt will demonstrate functional problem solving for basic and familiar tasks with Min A multimodal cueing  SLP Short Term Goal 4 - Progress (Week 1): Not met SLP Short Term Goal 5 (Week 1): Pt will utilize call bell to request assistance with Min verbal and question cues. SLP Short Term Goal 5 - Progress (Week 1): Not met SLP Short Term Goal 6 (Week 1): Pt will utilize speech intelligibility strategies at the phrase level with supervision verbal cues.  SLP Short Term Goal 6 - Progress (Week 1): Met  Skilled Therapeutic Interventions: Therapeutic intervention completed, with co-treatment with OT  for [employer].  Therapy focused on using compensatory strategies to decrease aspiration risk.  Patient required max verbal and tactile cues to consume nutrition.  She had no overt s/s of aspiration; however, she  did have mildly prolonged oral phase and delayed swallow initiation.  Continue with current treatment plan.    FIM:  FIM - Eating Eating Activity: 4: Help with picking up utensils;4: Helper occasionally brings food to mouth;4: Helper occasionally scoops  food on utensil;4: Helper checks for pocketed food  Pain Pain Assessment Pain Assessment: No/denies pain  Therapy/Group: Group Therapy  Sonjia Wilcoxson, Gwendolyn Fill 05/14/2013, 1:00 PM

## 2013-05-14 NOTE — Plan of Care (Signed)
Problem: RH BOWEL ELIMINATION Goal: RH STG MANAGE BOWEL WITH ASSISTANCE STG Manage Bowel with mod Assistance.  Outcome: Not Progressing incont today- some loose stools  Problem: RH BLADDER ELIMINATION Goal: RH STG MANAGE BLADDER WITH ASSISTANCE STG Manage Bladder With mod Assistance  Outcome: Not Progressing incont x 2

## 2013-05-14 NOTE — Progress Notes (Addendum)
Physical Therapy Session Note  Patient Details  Name: [name] MRN: [id number] Date of Birth: 14-Mar-1961  Today's Date: 05/14/2013 Time: 0800-0900 Time Calculation (min): 60 min   Skilled Therapeutic Interventions/Progress Updates:  1:1. Pt received semi-reclined in bed sleeping, req consistent max verbal/tactile cues to wake. Pt demonstrating difficulty maintaining eyes open and poor/absent initiation of all mobility this session. Pt req total A x2 persons for t/f sup>sit EOB w/ mild improvement in alertness and difficulty maintaining EO despite sternal rubs and cueing. Pt found to be incontinent of bowel and bladder as well as having soiled linen. Use of [place] Dec Plus to t/f pt bed>toilet>w/c to allow for opportunity to toilet as well as change linens. Pt sitting on toilet w/ B UE/trunk support provided by Newman Nip. Nurse in to assess pt 2/2 lethargy, pt providing intermittent and very delayed verbal responses of "yes, no or ok" w/ max cueing. BP recorded WFL. RN made MD aware. Pt total A x2 for cleanup and clothing change. Pt req total Ax2 for squat pivot t/f w/c>bed and t/f sit>supine. Pt semi-reclined in bed at end of session w/ all needs in reach, bed alarm on.  Therapy Documentation Precautions:  Precautions Precautions: Fall Precaution Comments: R UE hemiplegia, anxiety  Restrictions Weight Bearing Restrictions: No  See FIM for current functional status  Therapy/Group: Individual Therapy  Denzil Hughes 05/14/2013, 12:05 PM

## 2013-05-14 NOTE — Progress Notes (Signed)
Patient more lethargic today. Elevated temp this a.m. Dr Wynn Banker notified. New orders received. Urine culture sent. Patient more verbal this afternoon and able to tell where she is and the year. Patient still very drowsy and poor initiation. IV antibiotic started. Vitals stable this afternoon. Patient responds to commands and does open eyes on command but still appears very drowsy. Pills crushed today due for patient easier to swallow. Continue to monitor.

## 2013-05-14 NOTE — Progress Notes (Addendum)
Subjective/Complaints: More lethargic today, poor intake A 12 point review of systems has been performed and if not noted above is otherwise negative.   Objective: Vital Signs: Blood pressure 155/93, pulse 100, temperature 99.7 F (37.6 C), temperature source Oral, resp. rate 20, height 5\' 4"  (1.626 m), weight 72.53 kg (159 lb 14.4 oz), last menstrual period 08/26/2010, SpO2 99.00%. No results found. No results found for this basename: WBC, HGB, HCT, PLT,  in the last 72 hours No results found for this basename: NA, K, CL, CO, GLUCOSE, BUN, CREATININE, CALCIUM,  in the last 72 hours CBG (last 3)  No results found for this basename: GLUCAP,  in the last 72 hours  Wt Readings from Last 3 Encounters:  05/05/13 72.53 kg (159 lb 14.4 oz)  04/18/13 61.961 kg (136 lb 9.6 oz)  04/18/13 61.961 kg (136 lb 9.6 oz)    Physical Exam:  Vitals reviewed.  Eyes:  Pupils reactive to light  Neck: Normal range of motion. Neck supple. No thyromegaly present.  Cardiovascular: Normal rate and regular rhythm. No murmurs Respiratory: Effort normal and breath sounds normal. No respiratory distress. No wheezes  GI: Soft. Bowel sounds are normal. She exhibits no distension.  Musculoskeletal: She exhibits no pain with shoulder range of motion , no calf or thigh tenderness to palpation or swelling   Neurological:  Patient is asleep, but arouses briefly to voice, grips to command RUE Delayed processing. fair insight and awareness  4 minus/5 in the right deltoid, bicep, tricep, grip, hip flexor, knee extensors, ankle dorsi flexion plantar flexor  Left upper extremity has 1+ pec, trace bicep, deltoid, tricep, 0 at hand Left lower extremity has trace hip extension, 1 HAD, HAB, tr KE, 0 distally. Mainly increased extensor tone Absent sensation to light touch in the left upper and left lower extremity.withdraws to pinch on left side  2/4 tone Left pecs and LLE 1-2/4. Left heel cord remains  tight   Assessment/Plan: 1. 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 Physiatrist is providing close team supervision and 24 hour management of active medical problems listed below. Physiatrist and rehab team continue to assess barriers to discharge/monitor patient progress toward functional and medical goals.  Pursuing SNF given safety and dispo situation.   FIM: FIM - Bathing Bathing Steps Patient Completed: Chest;Left Arm;Abdomen;Right upper leg;Left upper leg Bathing: 3: Mod-Patient completes 5-7 41f 10 parts or 50-74%  FIM - Upper Body Dressing/Undressing Upper body dressing/undressing steps patient completed: Put head through opening of pull over shirt/dress Upper body dressing/undressing: 2: Max-Patient completed 25-49% of tasks FIM - Lower Body Dressing/Undressing Lower body dressing/undressing steps patient completed: Thread/unthread left pants leg Lower body dressing/undressing: 1: Two helpers  FIM - Toileting Toileting steps completed by patient: Performs perineal hygiene Toileting Assistive Devices:  (use of Sara plus) Toileting: 1: Two helpers  FIM - Diplomatic Services operational officer Devices: Psychiatrist Transfers: 1-Mechanical lift  FIM - Banker Devices: Bed rails;HOB elevated Bed/Chair Transfer: 1: Mechanical lift;2: Supine > Sit: Max A (lifting assist/Pt. 25-49%);3: Sit > Supine: Mod A (lifting assist/Pt. 50-74%/lift 2 legs)  FIM - Locomotion: Wheelchair Distance: 75'; PTA pt able to propel w/c w/ B UE; able to propel this session w/ R UE/LE, attempted B LE but decreased proprioception/attention to L LE Locomotion: Wheelchair: 2: Travels 50 - 149 ft with  maximal assistance (Pt: 25 - 49%) FIM - Locomotion: Ambulation Locomotion: Ambulation:  0: Activity did not occur  Comprehension Comprehension Mode: Auditory Comprehension: 3-Understands basic 50 - 74% of the time/requires cueing 25 - 50%  of the time  Expression Expression Mode: Verbal Expression: 2-Expresses basic 25 - 49% of the time/requires cueing 50 - 75% of the time. Uses single words/gestures.  Social Interaction Social Interaction: 2-Interacts appropriately 25 - 49% of time - Needs frequent redirection.  Problem Solving Problem Solving: 2-Solves basic 25 - 49% of the time - needs direction more than half the time to initiate, plan or complete simple activities  Memory Memory: 2-Recognizes or recalls 25 - 49% of the time/requires cueing 51 - 75% of the time   Medical Problem List and Plan:  1. 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 Status post type line embolization RICA aneurysm   -PRAFO LLE 2. DVT Prophylaxis/Anticoagulation: SCDs  3. Chronic headaches/Pain Management: continue prn tylenol.  4. Mood/Anxiety. Ativan 1 mg every 6 hours as needed. Paxil 50 mg daily--increase to 62.5  -encouraged pt to discuss emotions and issues with team 4. Neuropsych: This patient is not capable of making decisions on her own behalf.  5. Seizures: continue Keppra, and vimpat--dilantin resumed, level decreasing but still therapeutic given alb. 6. Dysphagia: Dysphagia 2 nectar liquids. Tolerating fairly well at present--needs to improve intake 7. E. Coli UTI: follow up culture 8. Acute renal insufficiency. Latest creatinine 0.85.   -would like for PO to be at least 1000cc daily on a regular basis before I stop IVF--still not there yet  -po appears to be improving 9. Spasticity: appears to be increasing.D/C baclofen secondary to decreased cognition 10.  Lethargy, has had strong odor to urine per RN, low grade temp, suspect UTI, start Cipro after cultures, Check CBC, CMET and DPH level as well LOS (Days) 9 A FACE TO  FACE EVALUATION WAS PERFORMED  Erick Colace 05/14/2013 7:32 AM

## 2013-05-14 NOTE — Progress Notes (Signed)
Occupational Therapy Note  Patient Details  Name: JOPLIN CANTY MRN: [id number] Date of Birth: Oct 16, 1960 Today's Date: 05/14/2013  [employer] with SLP  [phone]  Co treat  No c/o pain  Self feeding group with focus on safe swallowing techniques and cognition.   Pt much more lethargic today than previous days. Pt required total A to scoop food onto utensil and then bring utensil in front of mouth before pt would initiate putting bite of food into mouth. Pt with difficulty terminating task - unable to put utensil back on the table to pick up cup. Pt unable to suck from a straw and required total A to drink from cup today. Pt also not masticating any of the food so only foods along the Dys I diet were given. RN aware. Pt less verbal today and eyes half open.      Roney Mans [facility] 05/14/2013, 1:19 PM

## 2013-05-14 NOTE — Progress Notes (Signed)
Patient last documented bowel movement 12/7. Patient states feel constipated, sorbitol was given by previous nurse with no results. Notified Kirsteins, MD given order for dulcolax suppository. Will continue to monitor patient.

## 2013-05-15 ENCOUNTER — Inpatient Hospital Stay (HOSPITAL_COMMUNITY): Payer: PRIVATE HEALTH INSURANCE

## 2013-05-15 LAB — URINE CULTURE: Colony Count: NO GROWTH

## 2013-05-15 MED ORDER — PAROXETINE HCL ER 37.5 MG PO TB24
62.5000 mg | ORAL_TABLET | Freq: Every day | ORAL | Status: DC
  Administered 2013-05-15 – 2013-05-19 (×4): 62.5 mg via ORAL
  Filled 2013-05-15 (×6): qty 1

## 2013-05-15 NOTE — Progress Notes (Signed)
Subjective/Complaints: More alert today,no pain Constipated, no dysuria A 12 point review of systems has been performed and if not noted above is otherwise negative.   Objective: Vital Signs: Blood pressure 144/80, pulse 81, temperature 98.9 F (37.2 C), temperature source Oral, resp. rate 17, height 5\' 4"  (1.626 m), weight 72.53 kg (159 lb 14.4 oz), last menstrual period 08/26/2010, SpO2 100.00%. No results found.  Recent Labs  05/14/13 1040  WBC 4.7  HGB 9.5*  HCT 27.2*  PLT 60*    Recent Labs  05/14/13 1040  NA 138  K 3.7  CL 105  GLUCOSE 90  BUN 23  CREATININE 1.02  CALCIUM 8.6   CBG (last 3)  No results found for this basename: GLUCAP,  in the last 72 hours  Wt Readings from Last 3 Encounters:  05/05/13 72.53 kg (159 lb 14.4 oz)  04/18/13 61.961 kg (136 lb 9.6 oz)  04/18/13 61.961 kg (136 lb 9.6 oz)    Physical Exam:  Vitals reviewed.  Eyes:  Pupils reactive to light  Neck: Normal range of motion. Neck supple. No thyromegaly present.  Cardiovascular: Normal rate and regular rhythm. No murmurs Respiratory: Effort normal and breath sounds normal. No respiratory distress. No wheezes  GI: Soft. Bowel sounds are normal. She exhibits no distension.  Musculoskeletal: She exhibits no pain with shoulder range of motion , no calf or thigh tenderness to palpation or swelling   Neurological:  Patient is alert, grips to command RUE Delayed processing. fair insight and awareness  4 minus/5 in the right deltoid, bicep, tricep, grip, hip flexor, knee extensors, ankle dorsi flexion plantar flexor  Left upper extremity has 1+ pec, trace bicep, deltoid, tricep, 0 at hand Left lower extremity has trace hip extension, 1 HAD, HAB, tr KE, 0 distally. Mainly increased extensor tone Reduced sensation tin LUE and LLE 2/4 tone Left pecs and LLE 1-2/4. Left heel cord remains tight   Assessment/Plan: 1. 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 Physiatrist is providing close team supervision and 24 hour management of active medical problems listed below. Physiatrist and rehab team continue to assess barriers to discharge/monitor patient progress toward functional and medical goals.  Pursuing SNF given safety and dispo situation.   FIM: FIM - Bathing Bathing Steps Patient Completed: Chest;Left Arm;Abdomen;Right upper leg;Left upper leg Bathing: 3: Mod-Patient completes 5-7 32f 10 parts or 50-74%  FIM - Upper Body Dressing/Undressing Upper body dressing/undressing steps patient completed: Put head through opening of pull over shirt/dress Upper body dressing/undressing: 2: Max-Patient completed 25-49% of tasks FIM - Lower Body Dressing/Undressing Lower body dressing/undressing steps patient completed: Thread/unthread left pants leg Lower body dressing/undressing: 1: Two helpers  FIM - Toileting Toileting steps completed by patient: Performs perineal hygiene Toileting Assistive Devices:  (use of Sara plus) Toileting: 1: Two helpers  FIM - Diplomatic Services operational officer Devices: Psychiatrist Transfers: 1-Mechanical lift;1-Two helpers  FIM - Banker Devices: HOB elevated;Arm rests;Bed rails Bed/Chair Transfer: 1: Two helpers;1: Mechanical lift  FIM - Locomotion: Wheelchair Distance: 75'; PTA pt able to propel w/c w/ B UE; able to propel this session w/ R UE/LE, attempted B LE but decreased proprioception/attention to L LE Locomotion: Wheelchair: 0: Activity did not occur FIM - Locomotion: Ambulation Locomotion: Ambulation: 0: Activity did not occur  Comprehension Comprehension Mode: Auditory Comprehension: 3-Understands basic  50 - 74% of the time/requires cueing 25 - 50%  of the time  Expression Expression Mode:  Verbal Expression: 2-Expresses basic 25 - 49% of the time/requires cueing 50 - 75% of the time. Uses single words/gestures.  Social Interaction Social Interaction: 2-Interacts appropriately 25 - 49% of time - Needs frequent redirection.  Problem Solving Problem Solving: 2-Solves basic 25 - 49% of the time - needs direction more than half the time to initiate, plan or complete simple activities  Memory Memory: 2-Recognizes or recalls 25 - 49% of the time/requires cueing 51 - 75% of the time   Medical Problem List and Plan:  1. 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 Status post type line embolization RICA aneurysm   -PRAFO LLE 2. DVT Prophylaxis/Anticoagulation: SCDs  3. Chronic headaches/Pain Management: continue prn tylenol.  4. Mood/Anxiety. Ativan 1 mg every 6 hours as needed. Paxil 50 mg daily--increase to 62.5   4. Neuropsych: This patient is not capable of making decisions on her own behalf.  5. Seizures: continue Keppra, and vimpat--dilantin rd/ced due to throbocytopenia after discussion with neuro hospitalist Dr Roseanne Reno, no evidence of focal motor seizure, if breakthough seizure will increase vimpat 6. Dysphagia: Dysphagia 2 nectar liquids. Tolerating fairly well at present--needs to improve intake 7. E. Coli UTI: follow up culture 8. Acute renal insufficiency. Latest creatinine 0.85.   -would like for PO to be at least 1000cc daily on a regular basis before I stop IVF--still not there yet  -po appears to be improving 9. Spasticity: appears to be increasing.D/C baclofen secondary to decreased cognition 10.  Lethargy, has had strong odor to urine per RN, low grade temp, suspect UTI, start Cipro after cultures,  CBC, CMET ok except decreased Plt LOS (Days) 10 A FACE TO FACE EVALUATION WAS PERFORMED  Erick Colace 05/15/2013 7:46 AM

## 2013-05-15 NOTE — Progress Notes (Signed)
Occupational Therapy Note   Patient Details  Name: [name] MRN: [id number] Date of Birth: November 28, 1960 Today's Date: 05/15/2013 Time: 0820-0920  (60 min) Pain:none Individual session  Pt. Lying in bed eating breakfast upon OT arrival.  Allowed increased time to finish b fast.  Pt. Went from supine to left side lying to sit with min assist.  Sat on EOB with supervision.  Transferred with max assist to [facility]  (2nd person for safety).  Sat on BSC and had small BM and urine.   Pt. Went from sit to stand with mod assist and held standing balance for 1 minute with max assist while 2nd person cleaned peri area.  Pt washed Upper body and asked for deodorant.  More awake today than yesterday.  Pt. perseverating on bowels but able to redirect.  Left pt in wc with safety belt on and at nursing station.      Humberto Seals 05/15/2013, 9:37 AM

## 2013-05-16 ENCOUNTER — Inpatient Hospital Stay (HOSPITAL_COMMUNITY): Payer: PRIVATE HEALTH INSURANCE | Admitting: Occupational Therapy

## 2013-05-16 ENCOUNTER — Inpatient Hospital Stay (HOSPITAL_COMMUNITY): Payer: PRIVATE HEALTH INSURANCE

## 2013-05-16 ENCOUNTER — Encounter (HOSPITAL_COMMUNITY): Payer: PRIVATE HEALTH INSURANCE | Admitting: Occupational Therapy

## 2013-05-16 ENCOUNTER — Inpatient Hospital Stay (HOSPITAL_COMMUNITY): Payer: PRIVATE HEALTH INSURANCE | Admitting: Speech Pathology

## 2013-05-16 DIAGNOSIS — I609 Nontraumatic subarachnoid hemorrhage, unspecified: Secondary | ICD-10-CM

## 2013-05-16 DIAGNOSIS — G8100 Flaccid hemiplegia affecting unspecified side: Secondary | ICD-10-CM

## 2013-05-16 LAB — CBC WITH DIFFERENTIAL/PLATELET
Basophils Relative: 0 % (ref 0–1)
Eosinophils Relative: 4 % (ref 0–5)
HCT: 29.1 % — ABNORMAL LOW (ref 36.0–46.0)
Hemoglobin: 9.7 g/dL — ABNORMAL LOW (ref 12.0–15.0)
Lymphs Abs: 1.3 10*3/uL (ref 0.7–4.0)
MCH: 31.4 pg (ref 26.0–34.0)
MCHC: 33.3 g/dL (ref 30.0–36.0)
MCV: 94.2 fL (ref 78.0–100.0)
Monocytes Absolute: 0.4 10*3/uL (ref 0.1–1.0)
Monocytes Relative: 11 % (ref 3–12)
Neutro Abs: 1.6 10*3/uL — ABNORMAL LOW (ref 1.7–7.7)
Neutrophils Relative %: 47 % (ref 43–77)
Platelets: 113 10*3/uL — ABNORMAL LOW (ref 150–400)
RBC: 3.09 MIL/uL — ABNORMAL LOW (ref 3.87–5.11)
WBC: 3.4 10*3/uL — ABNORMAL LOW (ref 4.0–10.5)

## 2013-05-16 LAB — BASIC METABOLIC PANEL
BUN: 21 mg/dL (ref 6–23)
CO2: 23 mEq/L (ref 19–32)
Calcium: 8.8 mg/dL (ref 8.4–10.5)
Chloride: 109 mEq/L (ref 96–112)
Creatinine, Ser: 1.21 mg/dL — ABNORMAL HIGH (ref 0.50–1.10)
Glucose, Bld: 115 mg/dL — ABNORMAL HIGH (ref 70–99)
Potassium: 3.8 mEq/L (ref 3.5–5.1)
Sodium: 140 mEq/L (ref 135–145)

## 2013-05-16 LAB — PROTIME-INR
INR: 0.97 (ref 0.00–1.49)
Prothrombin Time: 12.7 seconds (ref 11.6–15.2)

## 2013-05-16 MED ORDER — CIPROFLOXACIN HCL 500 MG PO TABS: 500.0000 mg | ORAL_TABLET | Freq: Two times a day (BID) | ORAL | Status: DC

## 2013-05-16 MED ORDER — SODIUM CHLORIDE 0.45 % IV SOLN
INTRAVENOUS | Status: DC
  Administered 2013-05-16: 18:00:00 via INTRAVENOUS

## 2013-05-16 MED ORDER — OXYMETAZOLINE HCL 0.05 % NA SOLN
1.0000 | Freq: Two times a day (BID) | NASAL | Status: AC
  Administered 2013-05-16 – 2013-05-18 (×6): 1 via NASAL
  Filled 2013-05-16: qty 15

## 2013-05-16 NOTE — Progress Notes (Signed)
Occupational Therapy Session Note  Patient Details  Name: [name] MRN: [id number] Date of Birth: Oct 23, 1960  Today's Date: 05/16/2013 Time: 1000-1100 Time Calculation (min): 60 min  Short Term Goals: Week 2:  OT Short Term Goal 1 (Week 2): Pt will transfer with max A +1 consistently  OT Short Term Goal 2 (Week 2): Pt will initiate during a functional task within 3 seconds to perform self care task OT Short Term Goal 3 (Week 2): Pt will demonstrated sustained attention for 10 min with mod multimodal cues OT Short Term Goal 4 (Week 2): Pt will use call bell for toileting needs with max multimodal cues  Skilled Therapeutic Interventions/Progress Updates:    1:1 Pt in w/c in room looking at paper when arrived. Encouraged toileting trial. Pt able to transfers stand step pivot with grab bar +2 pt (75%) with total A for toileting steps. Pt with rectal blood clots with toileting-Rn and PA made aware. Focus on sit to stands, standing balance and functional ambulation with HHA with +2 (pt 50%) for safety with tactile  cues for weight shift for advancement of left LE and to maintain balance. Pt persevering on walking, even with when actually walking. Pt inappropriately crying about wanting to use a walker, a picture of random dog and desiring to walk when walking.   Session 2: 11:30-12:00  [employer] with SLP Co treat Self feeding group with focus on left body and environment attention with mod to max cuing, functional use of left UE as stabilizer with A, monitoring bite size, PO intake, appropriate socialization, and  Maintaining head at midline with visual scanning.   Therapy Documentation Precautions:  Precautions Precautions: Fall Precaution Comments: R UE hemiplegia, anxiety  Restrictions Weight Bearing Restrictions: No Pain: No indications of pain in session  See FIM for current functional status  Therapy/Group: Individual Therapy  Roney Mans [facility] 05/16/2013, 11:18  AM

## 2013-05-16 NOTE — Progress Notes (Signed)
Speech Language Pathology Daily Session Note  Patient Details  Name: [name] MRN: [id number] Date of Birth: 02-Apr-1961  Today's Date: 05/16/2013 Time: 9562-1308 Time Calculation (min): 45 min  Short Term Goals: Week 2: SLP Short Term Goal 1 (Week 2): Pt will utilize swallowing compensatory strategies to minimize overt s/s of aspiration with Min A verbal and question cues.  SLP Short Term Goal 2 (Week 2): Pt will utilize external memory aids to recall new, daily information with Mod A question and visual cues.  SLP Short Term Goal 3 (Week 2): Pt will demonstrate functional problem solving for basic and familiar tasks with Mod A multimodal cueing  SLP Short Term Goal 4 (Week 2): Pt will utilize call bell to request assistance with Mod verbal and question cues. SLP Short Term Goal 5 (Week 2): Pt will attend sustain attention to a functional task for 30 minutes with Mod A verbal cues for redirection  SLP Short Term Goal 6 (Week 2): Pt will utilize speech intelligibility strategies at the phrase level with Mod I.   Skilled Therapeutic Interventions: Treatment focus on speech and cognitive goals. Upon arrival, pt sitting up in her wheelchair.  SLP facilitated session by providing Max-Total A for functional problem solving in regards to using the bathroom and for recall of earlier events. Pt perseverative on using the bathroom throughout the session and required Mod verbal cues for redirection. SLP also facilitated session by providing total A demonstration cues to perform lingual and labial oral-motor exercises. Pt required Max tactile cues to decrease perseveration with task.  Overall, pt's vocal intensity was much improved today and pt was 100% intelligible at the phrase and sentence level.  Continue plan of care.    FIM:  Comprehension Comprehension Mode: Auditory Comprehension: 3-Understands basic 50 - 74% of the time/requires cueing 25 - 50%  of the time Expression Expression Mode:  Verbal Expression: 4-Expresses basic 75 - 89% of the time/requires cueing 10 - 24% of the time. Needs helper to occlude trach/needs to repeat words. Social Interaction Social Interaction: 4-Interacts appropriately 75 - 89% of the time - Needs redirection for appropriate language or to initiate interaction. Problem Solving Problem Solving: 2-Solves basic 25 - 49% of the time - needs direction more than half the time to initiate, plan or complete simple activities Memory Memory: 2-Recognizes or recalls 25 - 49% of the time/requires cueing 51 - 75% of the time  Pain Pain Assessment Pain Assessment: No/denies pain  Therapy/Group: Individual Therapy  Senta Kantor 05/16/2013, 3:45 PM

## 2013-05-16 NOTE — Progress Notes (Signed)
Patient with BRBPR today past voiding. She also had dark clots past blowing nose--added afrin X 3 days. Bleeding likely due to thrombocytopenia. Will check PT/INR. Daily CBC for now with close monitoring for bleeding complications.

## 2013-05-16 NOTE — Progress Notes (Signed)
RN was notified by therapist about some blood on the toilet after pt. Urinated.It happened when she cleaned the pt.'s bottom.Pam PA was notified.Keep monitoring pt. Closely and assessing her needs.

## 2013-05-16 NOTE — Progress Notes (Signed)
Subjective/Complaints: More alert today,no pain No further fevers POs excellent for fluids and calories A 12 point review of systems has been performed and if not noted above is otherwise negative.   Objective: Vital Signs: Blood pressure 136/72, pulse 89, temperature 98.2 F (36.8 C), temperature source Oral, resp. rate 18, height 5\' 4"  (1.626 m), weight 72.53 kg (159 lb 14.4 oz), last menstrual period 08/26/2010, SpO2 98.00%. No results found.  Recent Labs  05/14/13 1040  WBC 4.7  HGB 9.5*  HCT 27.2*  PLT 60*    Recent Labs  05/14/13 1040 05/16/13 0359  NA 138 140  K 3.7 3.8  CL 105 109  GLUCOSE 90 115*  BUN 23 21  CREATININE 1.02 1.21*  CALCIUM 8.6 8.8   CBG (last 3)  No results found for this basename: GLUCAP,  in the last 72 hours  Wt Readings from Last 3 Encounters:  05/05/13 72.53 kg (159 lb 14.4 oz)  04/18/13 61.961 kg (136 lb 9.6 oz)  04/18/13 61.961 kg (136 lb 9.6 oz)    Physical Exam:  Vitals reviewed.  Eyes:  Pupils reactive to light  Neck: Normal range of motion. Neck supple. No thyromegaly present.  Cardiovascular: Normal rate and regular rhythm. No murmurs Respiratory: Effort normal and breath sounds normal. No respiratory distress. No wheezes  GI: Soft. Bowel sounds are normal. She exhibits no distension.  Musculoskeletal: She exhibits no pain with shoulder range of motion , no calf or thigh tenderness to palpation or swelling   Neurological:  Patient is alert, grips to command RUE Delayed processing. fair insight and awareness  4 minus/5 in the right deltoid, bicep, tricep, grip, hip flexor, knee extensors, ankle dorsi flexion plantar flexor  Left upper extremity has 1+ pec, trace bicep, deltoid, tricep, 0 at hand Left lower extremity has trace hip extension, 1 HAD, HAB, tr KE, 0 distally. Mainly increased extensor tone Reduced sensation tin LUE and LLE 2/4 tone Left pecs and LLE 1-2/4. Left heel cord remains  tight   Assessment/Plan: 1. 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 Physiatrist is providing close team supervision and 24 hour management of active medical problems listed below. Physiatrist and rehab team continue to assess barriers to discharge/monitor patient progress toward functional and medical goals.  Pursuing SNF given safety and dispo situation.   FIM: FIM - Bathing Bathing Steps Patient Completed: Chest;Left Arm;Abdomen;Right upper leg;Left upper leg Bathing: 3: Mod-Patient completes 5-7 61f 10 parts or 50-74%  FIM - Upper Body Dressing/Undressing Upper body dressing/undressing steps patient completed: Put head through opening of pull over shirt/dress Upper body dressing/undressing: 2: Max-Patient completed 25-49% of tasks FIM - Lower Body Dressing/Undressing Lower body dressing/undressing steps patient completed: Thread/unthread left pants leg Lower body dressing/undressing: 1: Two helpers  FIM - Toileting Toileting steps completed by patient: Performs perineal hygiene Toileting Assistive Devices: Grab bar or rail for support Toileting: 1: Two helpers  FIM - Diplomatic Services operational officer Devices: Psychiatrist Transfers: 2-To toilet/BSC: Max A (lift and lower assist);2-From toilet/BSC: Max A (lift and lower assist);1-Two helpers  FIM - Banker Devices: Arm rests;HOB elevated;Bed rails Bed/Chair Transfer: 4: Supine > Sit: Min A (steadying Pt. > 75%/lift 1 leg);2: Bed > Chair or W/C: Max A (lift and lower assist);1: Two helpers  FIM - Locomotion: Wheelchair Distance: 75'; PTA pt able  to propel w/c w/ B UE; able to propel this session w/ R UE/LE, attempted B LE but decreased proprioception/attention to L LE Locomotion: Wheelchair: 0: Activity  did not occur FIM - Locomotion: Ambulation Locomotion: Ambulation: 0: Activity did not occur  Comprehension Comprehension Mode: Auditory Comprehension: 3-Understands basic 50 - 74% of the time/requires cueing 25 - 50%  of the time  Expression Expression Mode: Verbal Expression: 2-Expresses basic 25 - 49% of the time/requires cueing 50 - 75% of the time. Uses single words/gestures.  Social Interaction Social Interaction: 2-Interacts appropriately 25 - 49% of time - Needs frequent redirection.  Problem Solving Problem Solving: 2-Solves basic 25 - 49% of the time - needs direction more than half the time to initiate, plan or complete simple activities  Memory Memory: 2-Recognizes or recalls 25 - 49% of the time/requires cueing 51 - 75% of the time   Medical Problem List and Plan:  1. 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 Status post type line embolization RICA aneurysm   -PRAFO LLE 2. DVT Prophylaxis/Anticoagulation: SCDs  3. Chronic headaches/Pain Management: continue prn tylenol.  4. Mood/Anxiety. Ativan 1 mg every 6 hours as needed. Paxil 50 mg daily--increase to 62.5   4. Neuropsych: This patient is not capable of making decisions on her own behalf.  5. Seizures: continue Keppra, and vimpat--dilantin rd/ced due to throbocytopenia after discussion with neuro hospitalist Dr Roseanne Reno, no evidence of focal motor seizure, if breakthough seizure will increase vimpat 6. Dysphagia: Dysphagia 2 nectar liquids. Tolerating fairly well at present--needs to improve intake  8. Acute renal insufficiency. Latest creatinine 0.85.   -would like for PO to be at least 1000cc daily on a regular basis before I stop IVF--still not there yet  -po appears to be improving 9. Spasticity: appears to be increasing.D/C baclofen secondary to decreased cognition 10.  Lethargy, labs unremarkable, UCx neg, intake improved D/C cipro , D/C IVF during  the day, multifactorial SAH, hx astro, likely dilantin which has been D/Ced   LOS (Days) 11 A FACE TO FACE EVALUATION WAS PERFORMED  [name] 05/16/2013 8:00 AM

## 2013-05-16 NOTE — Progress Notes (Signed)
Speech Language Pathology Daily Session Note  Patient Details  Name: [name] MRN: [id number] Date of Birth: 07/21/1960  Today's Date: 05/16/2013 Time: 1200-1230 Time Calculation (min): 30 min  Short Term Goals: Week 2: SLP Short Term Goal 1 (Week 2): Pt will utilize swallowing compensatory strategies to minimize overt s/s of aspiration with Min A verbal and question cues.  SLP Short Term Goal 2 (Week 2): Pt will utilize external memory aids to recall new, daily information with Mod A question and visual cues.  SLP Short Term Goal 3 (Week 2): Pt will demonstrate functional problem solving for basic and familiar tasks with Mod A multimodal cueing  SLP Short Term Goal 4 (Week 2): Pt will utilize call bell to request assistance with Mod verbal and question cues. SLP Short Term Goal 5 (Week 2): Pt will attend sustain attention to a functional task for 30 minutes with Mod A verbal cues for redirection  SLP Short Term Goal 6 (Week 2): Pt will utilize speech intelligibility strategies at the phrase level with Mod I.   Skilled Therapeutic Interventions: Pt participated on co-treatment with OT in [employer] with focus on self-feeding and dysphagia goals. Pt consumed Dys. 2 textures with nectar-thick liquids via straw without overt s/s of aspiration and required Mod verbal/visual cues to self-monitor and correct left pocketing and anterior spillage. Pt lethargic throughout meal although with adequate PO intake. Continue plan of care.    FIM:  Comprehension Comprehension Mode: Auditory Comprehension: 3-Understands basic 50 - 74% of the time/requires cueing 25 - 50%  of the time Expression Expression Mode: Verbal Expression: 4-Expresses basic 75 - 89% of the time/requires cueing 10 - 24% of the time. Needs helper to occlude trach/needs to repeat words. Social Interaction Social Interaction: 4-Interacts appropriately 75 - 89% of the time - Needs redirection for appropriate language or to  initiate interaction. Problem Solving Problem Solving: 2-Solves basic 25 - 49% of the time - needs direction more than half the time to initiate, plan or complete simple activities Memory Memory: 2-Recognizes or recalls 25 - 49% of the time/requires cueing 51 - 75% of the time FIM - Eating Eating Activity: 5: Needs verbal cues/supervision;4: Helper checks for pocketed food  Pain Pain Assessment Pain Assessment: No/denies pain  Therapy/Group: Group Therapy   Maxcine Ham, M.A. CCC-SLP [phone]   Maxcine Ham 05/16/2013, 4:00 PM

## 2013-05-16 NOTE — Progress Notes (Signed)
Physical Therapy Session Note  Patient Details  Name: [name] MRN: [id number] Date of Birth: 1960-10-05  Today's Date: 05/16/2013 Time: 1300-1400 Time Calculation (min): 60 min  Short Term Goals: Week 2:  PT Short Term Goal 1 (Week 2): Pt will perform bed mobility w/ mod A and use of hospital bed functions PT Short Term Goal 2 (Week 2): Pt will sustain midline positioning during dynamic sitting tasks w/ min A and mod multimodal cues PT Short Term Goal 3 (Week 2): Pt to perform squat pivot t/f bed<>w/c w/ mod A PT Short Term Goal 4 (Week 2): Pt to propel w/c w/ R UE/LE x100' w/ min A and mod multimodal cues  Skilled Therapeutic Interventions/Progress Updates:  1:1. Pt received sitting in w/c, ready for therapy. Pt req max A for SPT w/c<>toilet w/ use of grab bar as well as max A for clothing management. [name] present after toileting and pt became emotional regarding his presence as well as perseverating on want to walk w/ a RW. Pt able to be redirected to walking in hall. Following initial trial w/ use of rail in hall, pt able to progress to ambulation 100'x2 and 65' w/ R HHA. Pt demonstrating variable BOS, gross advancement of L LE, decreased L foot clearance as well as L lean that exacerbates w/ fatigue req mod-max A. [name] following w/ w/c. Pt again perseverating on want to use a RW, however, when clarified pt wanted to walk with a walker to be able to walk by herself. Emotional support and education provided regarding current need for assist to be safe and steady before she could walk by her self. Pt further perseverating on wanting to strengthen her L LE. Transition to use of NuStep w/ B LE for strengthening and to allow pt to realize how much her L LE was actually doing. Pt w/ good tolerance to NuStep, level 1x12min, w/ tactile cues to L quad/hams. Pt req consistent cueing to attend to targets on L side of body, however, improved ability to maintain midline head position this  session. Pt sitting in w/c at end of tx session w/ all needs in reach and quick release belt in place.   Therapy Documentation Precautions:  Precautions Precautions: Fall Precaution Comments: R UE hemiplegia, anxiety  Restrictions Weight Bearing Restrictions: No  See FIM for current functional status  Therapy/Group: Individual Therapy  Denzil Hughes 05/16/2013, 2:01 PM

## 2013-05-17 ENCOUNTER — Inpatient Hospital Stay (HOSPITAL_COMMUNITY): Payer: PRIVATE HEALTH INSURANCE | Admitting: Speech Pathology

## 2013-05-17 ENCOUNTER — Inpatient Hospital Stay (HOSPITAL_COMMUNITY): Payer: PRIVATE HEALTH INSURANCE

## 2013-05-17 ENCOUNTER — Inpatient Hospital Stay (HOSPITAL_COMMUNITY): Payer: PRIVATE HEALTH INSURANCE | Admitting: Occupational Therapy

## 2013-05-17 LAB — CBC WITH DIFFERENTIAL/PLATELET
HCT: 27.7 % — ABNORMAL LOW (ref 36.0–46.0)
Hemoglobin: 9.3 g/dL — ABNORMAL LOW (ref 12.0–15.0)
Lymphocytes Relative: 32 % (ref 12–46)
Lymphs Abs: 1.1 10*3/uL (ref 0.7–4.0)
MCH: 31.3 pg (ref 26.0–34.0)
Monocytes Absolute: 0.2 10*3/uL (ref 0.1–1.0)
Monocytes Relative: 7 % (ref 3–12)
Neutro Abs: 2 10*3/uL (ref 1.7–7.7)
Neutrophils Relative %: 59 % (ref 43–77)
RBC: 2.97 MIL/uL — ABNORMAL LOW (ref 3.87–5.11)

## 2013-05-17 MED ORDER — ASPIRIN 325 MG PO TABS
325.0000 mg | ORAL_TABLET | Freq: Every day | ORAL | Status: DC
  Filled 2013-05-17 (×2): qty 1

## 2013-05-17 NOTE — Progress Notes (Signed)
Occupational Therapy Note  Patient Details  Name: [name] MRN: [id number] Date of Birth: [birth year] Today's Date: 05/17/2013 [employer] with SLP Group therapy -  No c/o pain  1130-1145 Self feeding group with Focus on visual and body attention to left, socialization, making wants and needs known, mod cuing for oral hygiene of left side of mouth due to spillage, mod cuing for left oral pocketing and to take small sips of drink. Pt with more alertness and initiation overall of all tasks.   Roney Mans [facility] 05/17/2013, 3:50 PM

## 2013-05-17 NOTE — Progress Notes (Signed)
Speech Language Pathology Daily Session Note  Patient Details  Name: [name] MRN: [id number] Date of Birth: 12-May-1961  Today's Date: 05/17/2013 Time: 7846-9629 Time Calculation (min): 45 min  Short Term Goals: Week 2: SLP Short Term Goal 1 (Week 2): Pt will utilize swallowing compensatory strategies to minimize overt s/s of aspiration with Min A verbal and question cues.  SLP Short Term Goal 2 (Week 2): Pt will utilize external memory aids to recall new, daily information with Mod A question and visual cues.  SLP Short Term Goal 3 (Week 2): Pt will demonstrate functional problem solving for basic and familiar tasks with Mod A multimodal cueing  SLP Short Term Goal 4 (Week 2): Pt will utilize call bell to request assistance with Mod verbal and question cues. SLP Short Term Goal 5 (Week 2): Pt will attend sustain attention to a functional task for 30 minutes with Mod A verbal cues for redirection  SLP Short Term Goal 6 (Week 2): Pt will utilize speech intelligibility strategies at the phrase level with Mod I.   Skilled Therapeutic Interventions: Treatment focus on dysphagia and cognitive-linguistic goals. SLP facilitated session by providing oral care and provided trials of thin liquids via cup. Pt demonstrated a timely swallow initiation and did not demonstrate any overt s/s of aspiration. However, given silent aspiration on previous MBS, recommend repeat MBS to assess for possible upgrade.  Pt also offered trials of Dys. 3 textures, however, declined due to feeling she was not "strong enough." Pt was 100% intelligible and demonstrated appropriate vocal intensity at the sentence level. Pt continues to demonstrate perseveration with verbal and functional tasks; however, pt is demonstrating increased emergent awareness of this by stating, "I could do this forever." Continue plan of care.    FIM:  Comprehension Comprehension Mode: Auditory Comprehension: 3-Understands basic 50 - 74% of the  time/requires cueing 25 - 50%  of the time Expression Expression Mode: Verbal Expression: 4-Expresses basic 75 - 89% of the time/requires cueing 10 - 24% of the time. Needs helper to occlude trach/needs to repeat words. Social Interaction Social Interaction: 4-Interacts appropriately 75 - 89% of the time - Needs redirection for appropriate language or to initiate interaction. Problem Solving Problem Solving: 2-Solves basic 25 - 49% of the time - needs direction more than half the time to initiate, plan or complete simple activities Memory Memory: 2-Recognizes or recalls 25 - 49% of the time/requires cueing 51 - 75% of the time FIM - Eating Eating Activity: 5: Supervision/cues;5: Set-up assist for cut food;5: Set-up assist for open containers  Pain Pain Assessment Pain Assessment: No/denies pain  Therapy/Group: Individual Therapy  [name] 05/17/2013, 2:15 PM

## 2013-05-17 NOTE — Progress Notes (Signed)
Subjective/Complaints: More alert. In good spirits. Motivated by the fact that she's stronger. Had blood in stool yesterday. A 12 point review of systems has been performed and if not noted above is otherwise negative.   Objective: Vital Signs: Blood pressure 149/87, pulse 76, temperature 98.4 F (36.9 C), temperature source Oral, resp. rate 18, height 5\' 4"  (1.626 m), weight 72.53 kg (159 lb 14.4 oz), last menstrual period 08/26/2010, SpO2 100.00%. No results found.  Recent Labs  05/16/13 1135 05/17/13 0540  WBC 3.4* 3.3*  HGB 9.7* 9.3*  HCT 29.1* 27.7*  PLT 113* 119*    Recent Labs  05/14/13 1040 05/16/13 0359  NA 138 140  K 3.7 3.8  CL 105 109  GLUCOSE 90 115*  BUN 23 21  CREATININE 1.02 1.21*  CALCIUM 8.6 8.8   CBG (last 3)  No results found for this basename: GLUCAP,  in the last 72 hours  Wt Readings from Last 3 Encounters:  05/05/13 72.53 kg (159 lb 14.4 oz)  04/18/13 61.961 kg (136 lb 9.6 oz)  04/18/13 61.961 kg (136 lb 9.6 oz)    Physical Exam:  Vitals reviewed.  Eyes:  Pupils reactive to light  Neck: Normal range of motion. Neck supple. No thyromegaly present.  Cardiovascular: Normal rate and regular rhythm. No murmurs Respiratory: Effort normal and breath sounds normal. No respiratory distress. No wheezes  GI: Soft. Bowel sounds are normal. She exhibits no distension.  Musculoskeletal: She exhibits no pain with shoulder range of motion , no calf or thigh tenderness to palpation or swelling   Neurological:  Patient is alert, grips to command RUE Delayed processing. fair insight and awareness  4 minus/5 in the right deltoid, bicep, tricep, grip, hip flexor, knee extensors, ankle dorsi flexion plantar flexor  Left upper extremity has 1+ pec, trace bicep, deltoid, tricep, 0 at hand. pec tone better, trace Left lower extremity has 1 hip extension, 1 HAD, HAB, 1+ KE, tr to 1 distally. Mainly increased extensor tone in lowers Reduced sensation tin LUE  and LLE 2/4 tone Left pecs and LLE 1-2/4. Left heel cord remains tight   Assessment/Plan: 1. 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 Physiatrist is providing close team supervision and 24 hour management of active medical problems listed below. Physiatrist and rehab team continue to assess barriers to discharge/monitor patient progress toward functional and medical goals.  Pursuing SNF given safety and dispo situation.   FIM: FIM - Bathing Bathing Steps Patient Completed: Chest;Left Arm;Abdomen;Right upper leg;Left upper leg Bathing: 3: Mod-Patient completes 5-7 43f 10 parts or 50-74%  FIM - Upper Body Dressing/Undressing Upper body dressing/undressing steps patient completed: Put head through opening of pull over shirt/dress Upper body dressing/undressing: 2: Max-Patient completed 25-49% of tasks FIM - Lower Body Dressing/Undressing Lower body dressing/undressing steps patient completed: Thread/unthread left pants leg Lower body dressing/undressing: 1: Two helpers  FIM - Toileting Toileting steps completed by patient: Performs perineal hygiene Toileting Assistive Devices: Grab bar or rail for support Toileting: 2: Max-Patient completed 1 of 3 steps  FIM - Diplomatic Services operational officer Devices: Psychiatrist Transfers: 2-To toilet/BSC: Max A (lift and lower assist);2-From toilet/BSC: Max A (lift and lower assist)  FIM - Banker Devices: Arm rests Bed/Chair Transfer: 2: Bed > Chair or W/C: Max A (lift and lower assist);2: Chair or W/C >  Bed: Max A (lift and lower assist)  FIM - Locomotion: Wheelchair Distance: 75'; PTA pt able to propel w/c w/ B UE; able to propel this session w/ R UE/LE, attempted B LE but decreased proprioception/attention to L  LE Locomotion: Wheelchair: 3: Travels 150 ft or more: maneuvers on rugs and over door sills with moderate assistance  (Pt: 50 - 74%) FIM - Locomotion: Ambulation Locomotion: Ambulation Assistive Devices: Other (comment) (R HHA) Ambulation/Gait Assistance: 2: Max assist Locomotion: Ambulation: 2: Travels 50 - 149 ft with maximal assistance (Pt: 25 - 49%)  Comprehension Comprehension Mode: Auditory Comprehension: 3-Understands basic 50 - 74% of the time/requires cueing 25 - 50%  of the time  Expression Expression Mode: Verbal Expression: 4-Expresses basic 75 - 89% of the time/requires cueing 10 - 24% of the time. Needs helper to occlude trach/needs to repeat words.  Social Interaction Social Interaction: 4-Interacts appropriately 75 - 89% of the time - Needs redirection for appropriate language or to initiate interaction.  Problem Solving Problem Solving: 2-Solves basic 25 - 49% of the time - needs direction more than half the time to initiate, plan or complete simple activities  Memory Memory: 2-Recognizes or recalls 25 - 49% of the time/requires cueing 51 - 75% of the time   Medical Problem List and Plan:  1. 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 Status post type line embolization RICA aneurysm   -PRAFO LLE 2. DVT Prophylaxis/Anticoagulation: SCDs  3. Chronic headaches/Pain Management: continue prn tylenol.  4. Mood/Anxiety. Ativan 1 mg every 6 hours as needed. Paxil 50 mg daily--increased to 62.5   4. Neuropsych: This patient is not capable of making decisions on her own behalf.  5. Seizures: continue Keppra, and vimpat--dilantin rd/ced due to throbocytopenia after discussion with neuro hospitalist Dr Roseanne Reno, no evidence of focal motor seizure, if breakthough seizure will increase vimpat 6. Dysphagia: Dysphagia 2 nectar liquids. Tolerating fairly well at present--needs to improve intake 7. E. Coli UTI: follow up  culture 8. Acute renal insufficiency. Latest creatinine 0.85.   -would like for PO to be at least 1000cc daily on a regular basis before I stop IVF--still not there yet  -po better 9. Spasticity: baclofen stopped due to AMS 10.  Lethargy, has had strong odor to urine per RN, low grade temp, suspect UTI, start Cipro after cultures,  CBC, CMET ok except decreased Plt 11. BRBPR: hgb 9.3, platelets up to 119.  -hold ECASA for now.  -observe for further bleeding today. Pt appears comfortable LOS (Days) 12 A FACE TO FACE EVALUATION WAS PERFORMED  SWARTZ,ZACHARY T 05/17/2013 8:28 AM

## 2013-05-17 NOTE — Progress Notes (Signed)
Occupational Therapy Session Note  Patient Details  Name: [name] MRN: [id number] Date of Birth: Jan 31, 1961  Today's Date: 05/17/2013 Time: 1000-1100 Time Calculation (min): 60 min  Short Term Goals: Week 2:  OT Short Term Goal 1 (Week 2): Pt will transfer with max A +1 consistently  OT Short Term Goal 2 (Week 2): Pt will initiate during a functional task within 3 seconds to perform self care task OT Short Term Goal 3 (Week 2): Pt will demonstrated sustained attention for 10 min with mod multimodal cues OT Short Term Goal 4 (Week 2): Pt will use call bell for toileting needs with max multimodal cues  Skilled Therapeutic Interventions/Progress Updates:    Pt with increased tone in left UE with more attempts for functional use with active assist. Pt ambulated to the bathroom with mod A at a slow pace. Pt transitioned into the running shower with mod A with more than reasonable amt of time. Pt Participated in bathing with active assist with left UE, focus on sit to stands, standing balance, visual scanning and attention to the left, maintaining head posture and trunk posture at midline, dressing with mod to max tactile and verbal cuing for attention to the left, standing balance, etc.   Therapy Documentation Precautions:  Precautions Precautions: Fall Precaution Comments: R UE hemiplegia, anxiety  Restrictions Weight Bearing Restrictions: No Pain:  no c/o pain in session   See FIM for current functional status  Therapy/Group: Individual Therapy  Roney Mans [facility] 05/17/2013, 11:21 AM

## 2013-05-17 NOTE — Plan of Care (Signed)
Problem: RH BLADDER ELIMINATION Goal: RH STG MANAGE BLADDER WITH ASSISTANCE STG Manage Bladder With mod Assistance  Outcome: Not Progressing Incontinent even with timed toileting;soaks briefs/clothes

## 2013-05-17 NOTE — Progress Notes (Signed)
Physical Therapy Session Note  Patient Details  Name: [name] MRN: [id number] Date of Birth: 03/05/1961  Today's Date: 05/17/2013 Time: 1305-1400 Time Calculation (min): 55 min  Short Term Goals: Week 2:  PT Short Term Goal 1 (Week 2): Pt will perform bed mobility w/ mod A and use of hospital bed functions PT Short Term Goal 2 (Week 2): Pt will sustain midline positioning during dynamic sitting tasks w/ min A and mod multimodal cues PT Short Term Goal 3 (Week 2): Pt to perform squat pivot t/f bed<>w/c w/ mod A PT Short Term Goal 4 (Week 2): Pt to propel w/c w/ R UE/LE x100' w/ min A and mod multimodal cues  Skilled Therapeutic Interventions/Progress Updates:  1:1. Pt received sitting in w/c, expressing excitement regarding progress pt made this AM. Pt very motivated to participate in therapy and continue to work on ambulation and dynamic standing balance. Pt able to amb 75'x2 and 150' w/ mod-max A for balance and therapist facilitating weight shift to R side for increased clearance of L LE. Pt req mod-max verbal/tactile cues to attend to L side of environment during amb. Pt performing target toss w/ bean bags for emphasis on maintaining midline, attending to both L and R sides as well as terminating grasp. Pt demonstrating increased emergent awareness of maintaining midline trunk and head position w/ use of mirror for visual feedback. Pt assessing alignment through out task w/ minimal cueing. Bean bags initially placed on L side to encourage increased attention to L side, then to R side to facilitate increased weight shift to R side. Pt demonstrating increased L lean w/ fatigue and req multiple seated rest break through out task. During clean up of activity, pt instructed to place bean bags on R side into buck on L side. Pt able to consistently attend to placing bean bags into bucket on L side after initial direction to. Pt req mod-max A for t/f sit<>stand at start of tx session 2/2 posterior  lean but req min A to min guard assist by end of session. Pt encouraged to try to use bathroom at end of session to prevent incontinence, however, unable to void. Pt sitting in w/c at end of session w/ all needs in reach and quick release belt in place.   Therapy Documentation Precautions:  Precautions Precautions: Fall Precaution Comments: R UE hemiplegia, anxiety  Restrictions Weight Bearing Restrictions: No Pain: Pain Assessment Pain Assessment: No/denies pain  See FIM for current functional status  Therapy/Group: Individual Therapy  Denzil Hughes 05/17/2013, 2:35 PM

## 2013-05-17 NOTE — Progress Notes (Signed)
Speech Language Pathology Daily Session Note  Patient Details  Name: [name] MRN: [id number] Date of Birth: [birth year]  Today's Date: 05/17/2013 Time: 4098-1191 Time Calculation (min): 30 min  Short Term Goals: Week 2: SLP Short Term Goal 1 (Week 2): Pt will utilize swallowing compensatory strategies to minimize overt s/s of aspiration with Min A verbal and question cues.  SLP Short Term Goal 2 (Week 2): Pt will utilize external memory aids to recall new, daily information with Mod A question and visual cues.  SLP Short Term Goal 3 (Week 2): Pt will demonstrate functional problem solving for basic and familiar tasks with Mod A multimodal cueing  SLP Short Term Goal 4 (Week 2): Pt will utilize call bell to request assistance with Mod verbal and question cues. SLP Short Term Goal 5 (Week 2): Pt will attend sustain attention to a functional task for 30 minutes with Mod A verbal cues for redirection  SLP Short Term Goal 6 (Week 2): Pt will utilize speech intelligibility strategies at the phrase level with Mod I.   Skilled Therapeutic Interventions: Pt participated on co-treatment with OT in diners club with focus on self-feeding and dysphagia goals. Pt consumed Dys. 2 textures with nectar-thick liquids via straw with cough x2 after consuming large sips. Pt required Min verbal/visual cues to self-monitor and correct left pocketing and anterior spillage. Pt more alert throughout meal with adequate PO intake. Continue plan of care.    FIM:  Comprehension Comprehension Mode: Auditory Comprehension: 3-Understands basic 50 - 74% of the time/requires cueing 25 - 50%  of the time Expression Expression Mode: Verbal Expression: 4-Expresses basic 75 - 89% of the time/requires cueing 10 - 24% of the time. Needs helper to occlude trach/needs to repeat words. Social Interaction Social Interaction: 4-Interacts appropriately 75 - 89% of the time - Needs redirection for appropriate language or to  initiate interaction. Problem Solving Problem Solving: 2-Solves basic 25 - 49% of the time - needs direction more than half the time to initiate, plan or complete simple activities Memory Memory: 2-Recognizes or recalls 25 - 49% of the time/requires cueing 51 - 75% of the time FIM - Eating Eating Activity: 5: Set-up assist for open containers;5: Needs verbal cues/supervision  Pain Pain Assessment Pain Assessment: No/denies pain  Therapy/Group: Group Therapy   Maxcine Ham, M.A. CCC-SLP [phone]   Maxcine Ham 05/17/2013, 4:05 PM

## 2013-05-18 ENCOUNTER — Inpatient Hospital Stay (HOSPITAL_COMMUNITY): Payer: PRIVATE HEALTH INSURANCE

## 2013-05-18 ENCOUNTER — Inpatient Hospital Stay (HOSPITAL_COMMUNITY): Payer: PRIVATE HEALTH INSURANCE | Admitting: *Deleted

## 2013-05-18 ENCOUNTER — Encounter (HOSPITAL_COMMUNITY): Payer: PRIVATE HEALTH INSURANCE | Admitting: Occupational Therapy

## 2013-05-18 ENCOUNTER — Inpatient Hospital Stay (HOSPITAL_COMMUNITY): Payer: PRIVATE HEALTH INSURANCE | Admitting: Physical Therapy

## 2013-05-18 ENCOUNTER — Inpatient Hospital Stay (HOSPITAL_COMMUNITY): Payer: PRIVATE HEALTH INSURANCE | Admitting: Speech Pathology

## 2013-05-18 DIAGNOSIS — I609 Nontraumatic subarachnoid hemorrhage, unspecified: Secondary | ICD-10-CM

## 2013-05-18 DIAGNOSIS — G8100 Flaccid hemiplegia affecting unspecified side: Secondary | ICD-10-CM

## 2013-05-18 NOTE — Progress Notes (Signed)
Occupational Therapy Note  Patient Details  Name: [name] MRN: [id number] Date of Birth: Aug 18, 1960 Today's Date: 05/18/2013  Time: 1400-1430 Pt denies pain Individual Therapy  Pt sitting in w/c upon arrival.  Initial focus on incorporating LUE into functional tasks.  Focus transitioned to problem solving operation of call bell and TV controls.  Pt required max verbal cues and HOH assist using RUE to operate controls.  Pt required extra time to respond to commands and complete tasks.   Lavone Neri [facility] 05/18/2013, 4:08 PM

## 2013-05-18 NOTE — Patient Care Conference (Signed)
Inpatient RehabilitationTeam Conference and Plan of Care Update Date: 05/17/2013   Time: 3:00 PM    Patient Name: [name]      Medical Record Number: [id number]  Date of Birth: 11/30/1960 Sex: Female         Room/Bed: 4W18C/4W18C-01 Payor Info: Payor: MEDCOST / Plan: MEDCOST / Product Type: *No Product type* /    Admitting Diagnosis: R SAH  Admit Date/Time:  05/05/2013  3:22 PM Admission Comments: No comment available   Primary Diagnosis:  Subarachnoid hemorrhage Principal Problem: Subarachnoid hemorrhage  Patient Active Problem List   Diagnosis Date Noted  . Subarachnoid hemorrhage 05/05/2013  . SAH (subarachnoid hemorrhage) 04/21/2013  . Cerebral aneurysm, nonruptured 04/21/2013  . Protein-calorie malnutrition, severe 04/19/2013  . History of radiation therapy   . 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 Faith Rogue Social Worker Present: Amada Jupiter, LCSW Nurse Present: Chana Bode, RN PT Present: Cyndia Skeeters, Scot Jun, PT OT Present: Roney Mans, OT;Ardis Rowan, Sherryl Manges, OT SLP Present: Feliberto Gottron, SLP PPS Coordinator present : Tora Duck, RN, CRRN     Current Status/Progress Goal Weekly Team Focus  Medical   improving neurocognitive function. now ambulating, perseverative now and sometimes needs redirection  see prior  tone control, nutrition   Bowel/Bladder   continent of bowel and bladder in day and continent with incontinence at times of bowel and bladder in the night  maintain continence of bowel and bladder with min. assist and supervision.  toileting q 2 hours .   Swallow/Nutrition/ Hydration   Dys. 2 textures with nectar-thick liquids, Min A  Supervision with least restrictive diet  Repeat MBS to asess for possible upgrade   ADL's             Mobility   Mod-Max A for bed mob, transfers, dynamic standing  balance and ambulation up to 100'  Goals Modified. Min-Mod A  attending to L side of environment/body, safety, functional mobility, functional transfers   Communication   Min A  Supervision   decrease perseveration, oral-motor exercises, verbalization of wants/needs   Safety/Cognition/ Behavioral Observations  Mod-Max A  Min A  working memory, attention to left field of enviornment, awareness    Pain   no complaints of pain.  keep pain level < 3.  Assess for pain q shift and after pain interventions.   Skin   bruising on BUE, redness on buttocks with EPC cream applied.  no new skin breakdown will be noted.  assess for skin integrity q shift.    Rehab Goals Patient on target to meet rehab goals: Yes *See Care Plan and progress notes for long and short-term goals.  Barriers to Discharge: ongoing safety issues    Possible Resolutions to Barriers:  see prior    Discharge Planning/Teaching Needs:  plan changed to SNF      Team Discussion:  Making good gains and revising goals back to minimal assistance. Still incont with bladder.  Monitor labs closely (recent platelet drop).  Plan still for SNF.  Revisions to Treatment Plan:  Goals upgraded back to original targets.   Continued Need for Acute Rehabilitation Level of Care: The patient requires daily medical management by a physician with specialized training in physical medicine and rehabilitation for the following conditions: Daily direction of a multidisciplinary physical rehabilitation program to ensure safe treatment while eliciting the highest outcome that is of practical value  to the patient.: Yes Daily medical management of patient stability for increased activity during participation in an intensive rehabilitation regime.: Yes Daily analysis of laboratory values and/or radiology reports with any subsequent need for medication adjustment of medical intervention for : Other;Neurological problems  Jaydyn Menon 05/18/2013, 8:52 AM

## 2013-05-18 NOTE — Progress Notes (Signed)
Speech-Language Pathology Note  Pt observed to have 2 explosive coughing episodes with thin liquids via straw with lunch meal, coughing appeared strong enough to clear suspected aspirates. Suspect overall fatigue negatively impacts her overall swallow function as she was consuming lunch for ~80 minutes. This clinician will downgrade liquids back to nectar-thick via straw and continue thin liquid trials with therapy.  Feliberto Gottron, [place], CCC-SLP [phone]

## 2013-05-18 NOTE — Progress Notes (Signed)
Social Work Patient ID: [name], female   DOB: 12-21-1960, 52 y.o.   MRN: [id number]  Have reviewed team conf with pt and continued search for SNF. Have left message also with pt's husband about d/c status/ bed search as well.  Unfortunately, the facilities that were first choices for pt/ husband are not contracted with pt's insurance.  I have received pending offers from three other local facilities and have made husband aware of need to make a choice on which they would prefer and then pends on insurance approval.  May be able to have SNF bed confirmed and transition by end of week.  Will keep team posted.  Carla Cutbirth, LCSW

## 2013-05-18 NOTE — Progress Notes (Signed)
Physical Therapy Note  Patient Details  Name: JILLAYNE WITTE MRN: [id number] Date of Birth: 03-30-1961 Today's Date: 05/18/2013  Time: 1100-1130 30 minutes  1:1 No c/o pain.  Treatment session focused on gait training.  Pt requires +2 assist to walk due to strong posterior lean.  Pt able to gait with max A +2 with assist needed for wt shifts, L LE placement as it tends to scissor with each step, and L LE advancement.  Pt requires max cuing and increased time for L attention and tracking to L.   Regan Llorente 05/18/2013, 11:29 AM

## 2013-05-18 NOTE — Progress Notes (Signed)
Occupational Therapy Session Note  Patient Details  Name: [name] MRN: [id number] Date of Birth: [birth year]  Today's Date: 05/18/2013 Time: 0815-0825 Time Calculation (min): 10 min  Short Term Goals: Week 2:  OT Short Term Goal 1 (Week 2): Pt will transfer with max A +1 consistently  OT Short Term Goal 2 (Week 2): Pt will initiate during a functional task within 3 seconds to perform self care task OT Short Term Goal 3 (Week 2): Pt will demonstrated sustained attention for 10 min with mod multimodal cues OT Short Term Goal 4 (Week 2): Pt will use call bell for toileting needs with max multimodal cues  Skilled Therapeutic Interventions/Progress Updates:    1:1 Pt in bed eating when arrived. Self care retraining including bed mobility, toilet transfer, toileting and dressing. Pt more delayed cognitively compared to yesterday with decr motor planning, initiation and ability to follow through with basic tasks. Pt unable to ambulate (compared to yesterday at with min  A ). Pt with max difficulty weight shifting to advance either foot. Mod A transfer w/c <>toilet with total A for toileting. Pt required max A for UB and LB dressing with increased lean to the left with difficulty self correcting. Returned to bed for transportation to [facility]  1130-12 [employer] with SLP No c/o pain  Self feeding group to continue to address cognition and self feeding. Pt with increased delayed responses today verbally and motorically. Pt required mod cuing to attend to completing meal due to internal distractions. Mod cuing for oral hygiene for anterior spillage. Pt took 80- min to eat lunch today with mod prompting. Pt also required mod to max cuing for clearing of left oral cavity.   Therapy Documentation Precautions:  Precautions Precautions: Fall Precaution Comments: R UE hemiplegia, anxiety  Restrictions Weight Bearing Restrictions: No General: General Amount of Missed OT Time (min): 35 Minutes due  to being transported to the MBS    Pain:  no c/o pain   See FIM for current functional status  Therapy/Group: Individual Therapy and Group Therapy  Roney Mans [facility] 05/18/2013, 1:12 PM

## 2013-05-18 NOTE — Progress Notes (Signed)
Physical Therapy Session Note  Patient Details  Name: [name] MRN: [id number] Date of Birth: 1960/08/16  Today's Date: 05/18/2013 Time: 4098-1191 Time Calculation (min): 30 min  Short Term Goals: Week 2:  PT Short Term Goal 1 (Week 2): Pt will perform bed mobility w/ mod A and use of hospital bed functions PT Short Term Goal 2 (Week 2): Pt will sustain midline positioning during dynamic sitting tasks w/ min A and mod multimodal cues PT Short Term Goal 3 (Week 2): Pt to perform squat pivot t/f bed<>w/c w/ mod A PT Short Term Goal 4 (Week 2): Pt to propel w/c w/ R UE/LE x100' w/ min A and mod multimodal cues  Skilled Therapeutic Interventions/Progress Updates:    Patient received sitting in wheelchair. Session focused on wheelchair mobility, way-finding, and attention to the L. Patient requires max verbal/visual/tactile cues for attention to obstacles on the L during wheelchair mobility, max cues for attention to task, and problem solving for obstacle negotiation. Patient requires significantly increased time to propel wheelchair 31' with R UE/LE. Patient left seated in wheelchair with seatbelt donned and all needs within reach.  Therapy Documentation Precautions:  Precautions Precautions: Fall Precaution Comments: L UE hemiplegia, anxiety  Restrictions Weight Bearing Restrictions: No Pain: Pain Assessment Pain Assessment: No/denies pain Pain Score: 0-No pain Locomotion : Ambulation Ambulation/Gait Assistance: Not tested (comment) Wheelchair Mobility Distance: 45   See FIM for current functional status  Therapy/Group: Individual Therapy  Chipper Herb. Rajean Desantiago, PT, DPT 05/18/2013, 4:03 PM

## 2013-05-18 NOTE — Procedures (Signed)
Objective Swallowing Evaluation: Modified Barium Swallowing Study  Patient Details  Name: [name] MRN: [id number] Date of Birth: 05/11/1961  Today's Date: 05/18/2013 Time: 0900-0930 Time Calculation (min): 30 min  Past Medical History:  Past Medical History  Diagnosis Date  . 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 Fracture, ankle 2012    right  . History of radiation therapy 09/23/10- 11/07/10    right parietal lobe  . Chronic headaches     uses oxycodone appropriately   Past Surgical History:  Past Surgical History  Procedure Laterality Date  . Orif ankle fracture  08/22/2010    right  . Radiology with anesthesia N/A 05/02/2013    Procedure: RADIOLOGY WITH ANESTHESIA;  Surgeon: [name], MD;  Location: [facility] OR;  Service: Radiology;  Laterality: N/A;   HPI:  52 yr old patient taken to the ED 04/18/13 because of incoordination and falls up to the point that according to her husband she is getting more sleepy. 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 CT head in the ED shows SAH, most likely traumatic with some fluid in the cyst.  MRI showed acute right MCA infarct with edema and perhaps some petechial hemorrhage, acute right subdural hematoma measuring 5 mm in thickness and superimposed on a small chronic postoperative extra-axial collection, acute subarachnoid hemorrhage, more so in the right hemisphere, trace intraventricular hemorrhage, suggestion of a small volume of hemorrhage within the chronic right hemisphere treatment site, trace leftward midline shift. No ventriculomegaly.  MBS on 11/22 and recommended Dys. 2 textures with nectar-thick liquids. Pt transferred to CIR 05/05/13 and has been participating in dysphagia treatment. Recommend repeat MBS today to assess for possible upgrade.      Recommendation/Prognosis  Clinical Impression Dysphagia Diagnosis: Moderate oral phase dysphagia;Mild pharyngeal phase dysphagia Clinical  impression: Pt demonstrates a moderate oral and mild pharyngeal dysphagia. Pt's oral phase is characterized by decreased bolus formation and mastication with extended posterior propulsion with mechanical soft textures due to left lingual and labial weakness.  Pt's pharyngeal phase is characterized by delayed swallow initiation with all consistencies leading to trace and intermittent flash penetration with thin liquids via straw.  Recommend pt  utilize straw to increase overall oral control. Recommend Dys.2 textures with thin liquids with full supervision for utilization of swallowing compensatory strategies.  Swallow Evaluation Recommendations Diet Recommendations: Dysphagia 2 (Fine chop);Thin liquid Liquid Administration via: Cup;Straw Medication Administration: Whole meds with puree Supervision: Full supervision/cueing for compensatory strategies;Patient able to self feed Compensations: Slow rate;Small sips/bites;Check for pocketing;Check for anterior loss Postural Changes and/or Swallow Maneuvers: Seated upright 90 degrees;Upright 30-60 min after meal;Out of bed for meals Oral Care Recommendations: Oral care before and after PO Prognosis Prognosis for Safe Diet Advancement: Good Individuals Consulted Consulted and Agree with Results and Recommendations: Patient  SLP Assessment/Plan   Short Term Goals: Week 2: SLP Short Term Goal 1 (Week 2): Pt will utilize swallowing compensatory strategies to minimize overt s/s of aspiration with Min A verbal and question cues.  SLP Short Term Goal 2 (Week 2): Pt will utilize external memory aids to recall new, daily information with Mod A question and visual cues.  SLP Short Term Goal 3 (Week 2): Pt will demonstrate functional problem solving for basic and familiar tasks with Mod A multimodal cueing  SLP Short Term Goal 4 (Week 2): Pt will utilize call bell to request assistance with Mod verbal and question cues. SLP Short Term Goal 5 (Week  2): Pt will  attend sustain attention to a functional task for 30 minutes with Mod A verbal cues for redirection  SLP Short Term Goal 6 (Week 2): Pt will utilize speech intelligibility strategies at the phrase level with Mod I.   General:  Date of Onset: 04/18/13 HPI: 52 yr old patient taken to the ED 04/18/13 because of incoordination and falls up to the point that according to her husband she is getting more sleepy. 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 CT head in the ED shows SAH, most likely traumatic with some fluid in the cyst.  MRI showed acute right MCA infarct with edema and perhaps some petechial hemorrhage, acute right subdural hematoma measuring 5 mm in thickness and superimposed on a small chronic postoperative extra-axial collection, acute subarachnoid hemorrhage, more so in the right hemisphere, trace intraventricular hemorrhage, suggestion of a small volume of hemorrhage within the chronic right hemisphere treatment site, trace leftward midline shift. No ventriculomegaly.  MBS on 11/22 and recommended Dys. 2 textures with nectar-thick liquids. Pt transferred to CIR 05/05/13 and has been participating in dysphagia treatment. Recommend repeat MBS today to assess for possible upgrade.  Type of Study: Modified Barium Swallowing Study Reason for Referral: Objectively evaluate swallowing function Previous Swallow Assessment: MBS on 11/22 and recommended Dys. 2 textures with nectar-thick liquids  Diet Prior to this Study: Dysphagia 2 (chopped);Nectar-thick liquids Temperature Spikes Noted: No Respiratory Status: Room air History of Recent Intubation: No Behavior/Cognition: Alert;Cooperative;Pleasant mood Oral Cavity - Dentition: Adequate natural dentition Oral Motor / Sensory Function: Impaired - see Bedside swallow eval Self-Feeding Abilities: Able to feed self;Needs assist Patient Positioning: Upright in chair Baseline Vocal Quality: Clear Volitional Cough: Weak Volitional  Swallow: Able to elicit Anatomy: Within functional limits Pharyngeal Secretions: Not observed secondary MBS  Reason for Referral:  Objectively evaluate swallowing function   Oral Phase Oral Preparation/Oral Phase Oral Phase: Impaired Oral - Thin Oral - Thin Cup: Not tested Oral - Thin Straw: Within functional limits Oral - Solids Oral - Puree: Within functional limits Oral - Mechanical Soft: Impaired mastication;Reduced posterior propulsion;Weak lingual manipulation Pharyngeal Phase  Pharyngeal Phase Pharyngeal Phase: Impaired Pharyngeal - Nectar Pharyngeal - Nectar Teaspoon: Not tested Pharyngeal - Nectar Straw: Not tested Pharyngeal - Thin Pharyngeal - Thin Cup: Not tested Pharyngeal - Thin Straw: Delayed swallow initiation;Premature spillage to valleculae;Penetration/Aspiration during swallow Penetration/Aspiration details (thin straw): Material enters airway, remains ABOVE vocal cords then ejected out Pharyngeal - Solids Pharyngeal - Puree: Delayed swallow initiation;Premature spillage to valleculae Pharyngeal - Mechanical Soft: Premature spillage to valleculae;Delayed swallow initiation Pharyngeal Phase - Comment Pharyngeal Comment: Pt with trace and intermittent flash penetration with thin liquids via straw  Cervical Esophageal Phase  Cervical Esophageal Phase Cervical Esophageal Phase: WFL   G-Codes    Teigen Parslow 2013/05/23, 9:58 AM

## 2013-05-18 NOTE — Progress Notes (Signed)
Subjective/Complaints: Had another good day yesterday. No problems this morning. A 12 point review of systems has been performed and if not noted above is otherwise negative.   Objective: Vital Signs: Blood pressure 150/81, pulse 79, temperature 98.7 F (37.1 C), temperature source Oral, resp. rate 19, height 5\' 4"  (1.626 m), weight 72.53 kg (159 lb 14.4 oz), last menstrual period 08/26/2010, SpO2 99.00%. No results found.  Recent Labs  05/16/13 1135 05/17/13 0540  WBC 3.4* 3.3*  HGB 9.7* 9.3*  HCT 29.1* 27.7*  PLT 113* 119*    Recent Labs  05/16/13 0359  NA 140  K 3.8  CL 109  GLUCOSE 115*  BUN 21  CREATININE 1.21*  CALCIUM 8.8   CBG (last 3)  No results found for this basename: GLUCAP,  in the last 72 hours  Wt Readings from Last 3 Encounters:  05/05/13 72.53 kg (159 lb 14.4 oz)  04/18/13 61.961 kg (136 lb 9.6 oz)  04/18/13 61.961 kg (136 lb 9.6 oz)    Physical Exam:  Vitals reviewed.  Eyes:  Pupils reactive to light  Neck: Normal range of motion. Neck supple. No thyromegaly present.  Cardiovascular: Normal rate and regular rhythm. No murmurs Respiratory: Effort normal and breath sounds normal. No respiratory distress. No wheezes  GI: Soft. Bowel sounds are normal. She exhibits no distension.  Musculoskeletal: She exhibits no pain with shoulder range of motion , no calf or thigh tenderness to palpation or swelling   Neurological:  Patient is alert.  Better processing. fair insight and awareness  4 minus/5 in the right deltoid, bicep, tricep, grip, hip flexor, knee extensors, ankle dorsi flexion plantar flexor  Left upper extremity has 1+ pec, trace bicep, deltoid, tricep, 0 at hand. pec tone better, trace Left lower extremity has 1 hip extension, 1 to 2- HAD, HAB, 1+ to2 KE, tr to 1 distally. Mainly increased extensor tone in lowers Reduced sensation tin LUE and LLE 2/4 tone Left pecs and LLE 1-2/4. Left heel cord remains tight   Assessment/Plan: 1.  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 Physiatrist is providing close team supervision and 24 hour management of active medical problems listed below. Physiatrist and rehab team continue to assess barriers to discharge/monitor patient progress toward functional and medical goals.  Pursuing SNF given safety and dispo situation. Making nice functional gains over the last couple days however. Much brighter and more interactive   FIM: FIM - Bathing Bathing Steps Patient Completed: Chest;Left Arm;Abdomen;Right upper leg;Left upper leg Bathing: 3: Mod-Patient completes 5-7 20f 10 parts or 50-74%  FIM - Upper Body Dressing/Undressing Upper body dressing/undressing steps patient completed: Put head through opening of pull over shirt/dress Upper body dressing/undressing: 2: Max-Patient completed 25-49% of tasks FIM - Lower Body Dressing/Undressing Lower body dressing/undressing steps patient completed: Thread/unthread left pants leg Lower body dressing/undressing: 1: Two helpers  FIM - Toileting Toileting steps completed by patient: Performs perineal hygiene Toileting Assistive Devices: Grab bar or rail for support Toileting: 2: Max-Patient completed 1 of 3 steps  FIM - Diplomatic Services operational officer Devices: Psychiatrist Transfers: 2-To toilet/BSC: Max A (lift and lower assist);2-From toilet/BSC: Max A (lift and lower assist)  FIM - Banker Devices: Arm rests Bed/Chair Transfer: 3: Chair or W/C > Bed: Mod A (lift or lower assist);3: Bed > Chair or W/C: Mod  A (lift or lower assist)  FIM - Locomotion: Wheelchair Distance: 75'; PTA pt able to propel w/c w/ B UE; able to propel this session w/ R UE/LE, attempted B LE but decreased proprioception/attention to L  LE Locomotion: Wheelchair: 0: Activity did not occur FIM - Locomotion: Ambulation Locomotion: Ambulation Assistive Devices: Other (comment) (HHA) Ambulation/Gait Assistance: 2: Max assist Locomotion: Ambulation: 2: Travels 50 - 149 ft with maximal assistance (Pt: 25 - 49%)  Comprehension Comprehension Mode: Auditory Comprehension: 3-Understands basic 50 - 74% of the time/requires cueing 25 - 50%  of the time  Expression Expression Mode: Verbal Expression: 4-Expresses basic 75 - 89% of the time/requires cueing 10 - 24% of the time. Needs helper to occlude trach/needs to repeat words.  Social Interaction Social Interaction: 4-Interacts appropriately 75 - 89% of the time - Needs redirection for appropriate language or to initiate interaction.  Problem Solving Problem Solving: 2-Solves basic 25 - 49% of the time - needs direction more than half the time to initiate, plan or complete simple activities  Memory Memory: 2-Recognizes or recalls 25 - 49% of the time/requires cueing 51 - 75% of the time   Medical Problem List and Plan:  1. 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 Status post type line embolization RICA aneurysm   -PRAFO LLE 2. DVT Prophylaxis/Anticoagulation: SCDs  3. Chronic headaches/Pain Management: continue prn tylenol.  4. Mood/Anxiety. Ativan 1 mg every 6 hours as needed--limit as possible. Paxil 50 mg daily--increased to 62.5   4. Neuropsych: This patient is not capable of making decisions on her own behalf.  5. Seizures: continue Keppra, and vimpat--dilantin rd/ced due to throbocytopenia after discussion with neuro hospitalist Dr Roseanne Reno, no evidence of focal motor seizure, if breakthough seizure will increase vimpat 6. Dysphagia: Dysphagia 2 nectar liquids. Tolerating fairly well at present-  -holding HS IVF for now 7. E. Coli UTI: follow up culture 8. Acute renal insufficiency. Latest creatinine 0.85.    -would like for PO to be at least 1000cc daily on a regular basis before I stop IVF--still not there yet  -po better 9. Spasticity: baclofen reduced due to AMS 10.  Lethargy--improved, much more interactive 11. BRBPR: hgb 9.3, platelets up to 119--recheck tomorrow  -holding ECASA for now.  -no recurrent bleeding yesterday LOS (Days) 13 A FACE TO FACE EVALUATION WAS PERFORMED  Anida Deol T 05/18/2013 8:29 AM

## 2013-05-18 NOTE — Progress Notes (Signed)
Speech Language Pathology Daily Session Note  Patient Details  Name: [name] MRN: [id number] Date of Birth: [birth year]  Today's Date: 05/18/2013 Time: 1200-1243 Time Calculation (min): 43 min  Short Term Goals: Week 2: SLP Short Term Goal 1 (Week 2): Pt will utilize swallowing compensatory strategies to minimize overt s/s of aspiration with Min A verbal and question cues.  SLP Short Term Goal 2 (Week 2): Pt will utilize external memory aids to recall new, daily information with Mod A question and visual cues.  SLP Short Term Goal 3 (Week 2): Pt will demonstrate functional problem solving for basic and familiar tasks with Mod A multimodal cueing  SLP Short Term Goal 4 (Week 2): Pt will utilize call bell to request assistance with Mod verbal and question cues. SLP Short Term Goal 5 (Week 2): Pt will attend sustain attention to a functional task for 30 minutes with Mod A verbal cues for redirection  SLP Short Term Goal 6 (Week 2): Pt will utilize speech intelligibility strategies at the phrase level with Mod I.   Skilled Therapeutic Interventions: Pt participated on co-treatment with OT in diners club with focus on self-feeding and dysphagia goals. Pt consumed Dys. 2 textures with thin liquids via straw with no overt s/s of aspiration at the beginning of the meal; however, pt required Mod cues for sustained attention to self-feeding task, resulting in ~80 minutes of PO intake to consume lunch meal. Pt also required Mod cues for left-sided pocketing, anterior spillage, small bites/sips, and an upright head posture. At the end of lunch, pt exhibited 2 episodes of strong, reflexive coughing with thin liquids. SLP suspects that fatigue after prolonged period of PO intake resulted in increased aspiration risk. Primary SLP aware and with plans to downgrade to nectar thick liquids at this time. Continue plan of care.   FIM:  Comprehension Comprehension Mode: Auditory Comprehension: 3-Understands  basic 50 - 74% of the time/requires cueing 25 - 50%  of the time Expression Expression Mode: Verbal Expression: 3-Expresses basic 50 - 74% of the time/requires cueing 25 - 50% of the time. Needs to repeat parts of sentences. Social Interaction Social Interaction: 3-Interacts appropriately 50 - 74% of the time - May be physically or verbally inappropriate. Problem Solving Problem Solving: 2-Solves basic 25 - 49% of the time - needs direction more than half the time to initiate, plan or complete simple activities Memory Memory: 2-Recognizes or recalls 25 - 49% of the time/requires cueing 51 - 75% of the time FIM - Eating Eating Activity: 5: Needs verbal cues/supervision;4: Helper checks for pocketed food  Pain Pain Assessment Pain Assessment: No/denies pain  Therapy/Group: Group Therapy   Maxcine Ham, M.A. CCC-SLP [phone]   Maxcine Ham 05/18/2013, 1:25 PM

## 2013-05-19 ENCOUNTER — Ambulatory Visit (HOSPITAL_COMMUNITY): Payer: PRIVATE HEALTH INSURANCE

## 2013-05-19 ENCOUNTER — Inpatient Hospital Stay (HOSPITAL_COMMUNITY): Payer: PRIVATE HEALTH INSURANCE

## 2013-05-19 ENCOUNTER — Inpatient Hospital Stay (HOSPITAL_COMMUNITY): Payer: PRIVATE HEALTH INSURANCE | Admitting: Occupational Therapy

## 2013-05-19 MED ORDER — METHYLPHENIDATE HCL 5 MG PO TABS
5.0000 mg | ORAL_TABLET | Freq: Two times a day (BID) | ORAL | Status: DC
  Administered 2013-05-19 – 2013-05-20 (×2): 5 mg via ORAL
  Filled 2013-05-19 (×3): qty 1

## 2013-05-19 MED ORDER — LORAZEPAM 0.5 MG PO TABS: 0.5000 mg | ORAL_TABLET | Freq: Four times a day (QID) | ORAL | Status: DC | PRN

## 2013-05-19 NOTE — Progress Notes (Signed)
Physical Therapy Session Note  Patient Details  Name: [name] MRN: [id number] Date of Birth: [birth year]  Today's Date: 05/19/2013 Time: 1015-1110 Time Calculation (min): 55 min  Short Term Goals: Week 2:  PT Short Term Goal 1 (Week 2): Pt will perform bed mobility w/ mod A and use of hospital bed functions PT Short Term Goal 2 (Week 2): Pt will sustain midline positioning during dynamic sitting tasks w/ min A and mod multimodal cues PT Short Term Goal 3 (Week 2): Pt to perform squat pivot t/f bed<>w/c w/ mod A PT Short Term Goal 4 (Week 2): Pt to propel w/c w/ R UE/LE x100' w/ min A and mod multimodal cues  Skilled Therapeutic Interventions/Progress Updates:  1:1. Pt received sitting in w/c, ready for therapy. Focus this session on gait training, maintaining midline during static/dynamic sitting as well as attending to L side during all functional tasks. Pt req total Ax2 persons this session for amb 3 musketeer style x50'. Gait characterized by hard L lean, overall flexed posture, scissoring and gross advancement of B LE. Unable to promote improved posture and gait pattern w/ max verbal/tactile cues. Trial standing bean bag toss w/ bean bags on R side to promote weight shift to R. Unable to promote improved orientation to midline/weight shift to R for complete task, downgraded task to sitting-pt unable to find center target, downgraded to visual tracking of single green bean bag- req max verbal/tactile cues to maintain sustained attention and track to L side. Total Ax2 persons overall for dynamic balance tasks. Pt demonstrating improved tracking to L when placing bean bags in buck on L side. Pt req total A for oral care 2/2 pocketing of food in L cheek. Pt sitting in w/c at end of session in day room, ready for diners club.   Therapy Documentation Precautions:  Precautions Precautions: Fall Precaution Comments: L UE hemiplegia, anxiety  Restrictions Weight Bearing Restrictions:  No  See FIM for current functional status  Therapy/Group: Individual Therapy  Denzil Hughes 05/19/2013, 11:17 AM

## 2013-05-19 NOTE — Progress Notes (Signed)
Social Work Patient ID: [name], female   DOB: [birth year], 52 y.o.   MRN: [id number]   Received SNF bed offer from [facility] who can admit pt tomorrow.  Husband went to visit facility this morning and has accepted the offer.  Plan to transfer pt via ambulance tomorrow around 11 am.  [place] team aware.  Irish Breisch, LCSW

## 2013-05-19 NOTE — Discharge Summary (Signed)
Discharge summary job [id number]

## 2013-05-19 NOTE — Progress Notes (Signed)
Speech Language Pathology Daily Session Note  Patient Details  Name: [name] MRN: [id number] Date of Birth: 1961/03/17  Today's Date: 05/19/2013 Time: 0825-0910 Time Calculation (min): 45 min  Short Term Goals: Week 2: SLP Short Term Goal 1 (Week 2): Pt will utilize swallowing compensatory strategies to minimize overt s/s of aspiration with Min A verbal and question cues.  SLP Short Term Goal 1 - Progress (Week 2): Progressing toward goal SLP Short Term Goal 2 (Week 2): Pt will utilize external memory aids to recall new, daily information with Mod A question and visual cues.  SLP Short Term Goal 2 - Progress (Week 2): Progressing toward goal SLP Short Term Goal 3 (Week 2): Pt will demonstrate functional problem solving for basic and familiar tasks with Mod A multimodal cueing  SLP Short Term Goal 3 - Progress (Week 2): Progressing toward goal SLP Short Term Goal 4 (Week 2): Pt will utilize call bell to request assistance with Mod verbal and question cues. SLP Short Term Goal 4 - Progress (Week 2): Progressing toward goal SLP Short Term Goal 5 (Week 2): Pt will attend sustain attention to a functional task for 30 minutes with Mod A verbal cues for redirection  SLP Short Term Goal 5 - Progress (Week 2): Progressing toward goal SLP Short Term Goal 6 (Week 2): Pt will utilize speech intelligibility strategies at the phrase level with Mod I.   Skilled Therapeutic Interventions: Treatment focused on swallowing and cognitive recovery. Max verbal, visual, and tactile cueing required today for sustained attention to basic functional task, initiation of self feeding, maintenance of upright posture. Moderate-max questioning cues provided for facilitation of recall of basic daily information pertaining to current diet restrictions and therapy plans. Trials of thin liquids provided for diagnostic treatment of dysphagia, Intermittent coughing persists indicative of decreased airway protection,  suspect a delayed swallow intiation given severity of oral phase deficits and cognitive status. Recommend continuation of nectar thick liquids with meals, advance to thin liquids once patient able to better follow through with compensatory strategies as max cueing for small single sips effective at decreasing aspiration episodes.   FIM:  Comprehension Comprehension Mode: Auditory Comprehension: 3-Understands basic 50 - 74% of the time/requires cueing 25 - 50%  of the time Expression Expression Mode: Verbal Expression: 2-Expresses basic 25 - 49% of the time/requires cueing 50 - 75% of the time. Uses single words/gestures. Social Interaction Social Interaction: 3-Interacts appropriately 50 - 74% of the time - May be physically or verbally inappropriate. Problem Solving Problem Solving: 2-Solves basic 25 - 49% of the time - needs direction more than half the time to initiate, plan or complete simple activities Memory Memory: 2-Recognizes or recalls 25 - 49% of the time/requires cueing 51 - 75% of the time  Pain Pain Assessment Pain Assessment: No/denies pain  Therapy/Group: Individual Therapy  Ferdinand Lango MA, CCC-SLP [phone]  [name] 05/19/2013, 9:19 AM

## 2013-05-19 NOTE — Progress Notes (Signed)
Occupational Therapy Daily Progress Note  Patient Details  Name: [name] MRN: [id number] Date of Birth: 1961/04/26  Today's Date: 05/19/2013 Time: 0915-1000 Time Calculation (min): 45 min  1:1 self care retraining with focus on toilet transfers, peri hygiene, toileting, and dressing. Pt with decr motor planning, attention to left, processing one step directions, properception  in left LE, heavy lean towards left requiring max A to remain upright with difficulty correcting balance, difficulty with terminating a task (ie holding onto grab bar), transfers with +2 for safety, etc. Pt with max difficulty with transitional/ rotational  Movements. Pt with decr progress today compared to Tuesday. Pt unable to motor plan and maintain balance to perform ambulation today (unsafe). Pt required max - total A for ADL tasks today    [employer] with SLP No c/o pain  1130-12 Self feeding group with focus on visual and body attention to the left, processing information with more than reasonable amt of time, mod to max cuing to know whether there was food on her utensil before she brought it to her mouth, mod A to manage cup without a lid to avoid spillage, max cuing for oral hygiene, attention to drooling towards end of meal. Pt took almost 1 1/2 hrs to complete meal with nursing supervising last 1/2 hr. Pt with difficulty maintaining head position at midline with decr visual tracking to left.  Roney Mans [facility] 05/19/2013, 10:06 AM

## 2013-05-19 NOTE — Progress Notes (Addendum)
Subjective/Complaints: Pt without new complaints. Seems to be more fatigued and slow to arouse once again today. This was noted in therapy yesterday as well at least on one occasion. A 12 point review of systems has been performed and if not noted above is otherwise negative.   Objective: Vital Signs: Blood pressure 142/78, pulse 85, temperature 98.5 F (36.9 C), temperature source Oral, resp. rate 17, height 5\' 4"  (1.626 m), weight 68.04 kg (150 lb), last menstrual period 08/26/2010, SpO2 98.00%. Dg Chest 2 View  05/18/2013   CLINICAL DATA:  Nursing home placement.  Post stroke.  EXAM: CHEST  2 VIEW  COMPARISON:  08/30/2010.  FINDINGS: There is improved aeration of the lungs. The heart size is stable at the upper limits of normal. The mediastinal contours are normal. There is no confluent airspace opacity, pleural effusion or pneumothorax. No osseous abnormalities are identified.  IMPRESSION: Stable borderline heart size.  No acute cardiopulmonary process.   Electronically Signed   By: Roxy Horseman M.D.   On: 05/18/2013 20:59   Dg Swallowing Func-speech Pathology  05/18/2013   Huston Foley, CCC-SLP     05/18/2013  9:59 AM Objective Swallowing Evaluation: Modified Barium Swallowing Study   Patient Details  Name: [name] MRN: [id number] Date of Birth: 1961-01-16  Today's Date: 05/18/2013 Time: 0900-0930 Time Calculation (min): 30 min  Past Medical History:  Past Medical History  Diagnosis Date  . 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 Fracture, ankle 2012    right  . History of radiation therapy 09/23/10- 11/07/10    right parietal lobe  . Chronic headaches     uses oxycodone appropriately   Past Surgical History:  Past Surgical History  Procedure Laterality Date  . Orif ankle fracture  08/22/2010    right  . Radiology with anesthesia N/A 05/02/2013    Procedure: RADIOLOGY WITH ANESTHESIA;  Surgeon: [name], MD;  Location: [facility] OR;  Service: Radiology;   Laterality:  N/A;   HPI:  52 yr old patient taken to the ED 04/18/13 because of  incoordination and falls up to the point that according to her  husband she is getting more sleepy. 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 CT head in the ED shows SAH, most  likely traumatic with some fluid in the cyst.  MRI showed acute  right MCA infarct with edema and perhaps some petechial  hemorrhage, acute right subdural hematoma measuring 5 mm in  thickness and superimposed on a small chronic postoperative  extra-axial collection, acute subarachnoid hemorrhage, more so in  the right hemisphere, trace intraventricular hemorrhage,  suggestion of a small volume of hemorrhage within the chronic  right hemisphere treatment site, trace leftward midline shift. No  ventriculomegaly.  MBS on 11/22 and recommended Dys. 2 textures  with nectar-thick liquids. Pt transferred to CIR 05/05/13 and has  been participating in dysphagia treatment. Recommend repeat MBS  today to assess for possible upgrade.      Recommendation/Prognosis  Clinical Impression Dysphagia Diagnosis: Moderate oral phase dysphagia;Mild  pharyngeal phase dysphagia Clinical impression: Pt demonstrates a moderate oral and mild  pharyngeal dysphagia. Pt's oral phase is characterized by  decreased bolus formation and mastication with extended posterior  propulsion with mechanical soft textures due to left lingual and  labial weakness.  Pt's pharyngeal phase is characterized by  delayed swallow initiation with all consistencies leading to  trace and intermittent flash penetration with thin liquids  via  straw.  Recommend pt  utilize straw to increase overall oral  control. Recommend Dys.2 textures with thin liquids with full  supervision for utilization of swallowing compensatory  strategies.  Swallow Evaluation Recommendations Diet Recommendations: Dysphagia 2 (Fine chop);Thin liquid Liquid Administration via: Cup;Straw Medication Administration: Whole meds with puree  Supervision: Full supervision/cueing for compensatory  strategies;Patient able to self feed Compensations: Slow rate;Small sips/bites;Check for  pocketing;Check for anterior loss Postural Changes and/or Swallow Maneuvers: Seated upright 90  degrees;Upright 30-60 min after meal;Out of bed for meals Oral Care Recommendations: Oral care before and after PO Prognosis Prognosis for Safe Diet Advancement: Good Individuals Consulted Consulted and Agree with Results and Recommendations: Patient  SLP Assessment/Plan   Short Term Goals: Week 2: SLP Short Term Goal 1 (Week 2): Pt will utilize  swallowing compensatory strategies to minimize overt s/s of  aspiration with Min A verbal and question cues.  SLP Short Term Goal 2 (Week 2): Pt will utilize external memory  aids to recall new, daily information with Mod A question and  visual cues.  SLP Short Term Goal 3 (Week 2): Pt will demonstrate functional  problem solving for basic and familiar tasks with Mod A  multimodal cueing  SLP Short Term Goal 4 (Week 2): Pt will utilize call bell to  request assistance with Mod verbal and question cues. SLP Short Term Goal 5 (Week 2): Pt will attend sustain attention  to a functional task for 30 minutes with Mod A verbal cues for  redirection  SLP Short Term Goal 6 (Week 2): Pt will utilize speech  intelligibility strategies at the phrase level with Mod I.   General:  Date of Onset: 04/18/13 HPI: 52 yr old patient taken to the ED 04/18/13 because of  incoordination and falls up to the point that according to her  husband she is getting more sleepy. 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 CT head in the ED shows SAH, most  likely traumatic with some fluid in the cyst.  MRI showed acute  right MCA infarct with edema and perhaps some petechial  hemorrhage, acute right subdural hematoma measuring 5 mm in  thickness and superimposed on a small chronic postoperative  extra-axial collection, acute subarachnoid hemorrhage, more  so in  the right hemisphere, trace intraventricular hemorrhage,  suggestion of a small volume of hemorrhage within the chronic  right hemisphere treatment site, trace leftward midline shift. No  ventriculomegaly.  MBS on 11/22 and recommended Dys. 2 textures  with nectar-thick liquids. Pt transferred to CIR 05/05/13 and has  been participating in dysphagia treatment. Recommend repeat MBS  today to assess for possible upgrade.  Type of Study: Modified Barium Swallowing Study Reason for Referral: Objectively evaluate swallowing function Previous Swallow Assessment: MBS on 11/22 and recommended Dys. 2  textures with nectar-thick liquids  Diet Prior to this Study: Dysphagia 2 (chopped);Nectar-thick  liquids Temperature Spikes Noted: No Respiratory Status: Room air History of Recent Intubation: No Behavior/Cognition: Alert;Cooperative;Pleasant mood Oral Cavity - Dentition: Adequate natural dentition Oral Motor / Sensory Function: Impaired - see Bedside swallow  eval Self-Feeding Abilities: Able to feed self;Needs assist Patient Positioning: Upright in chair Baseline Vocal Quality: Clear Volitional Cough: Weak Volitional Swallow: Able to elicit Anatomy: Within functional limits Pharyngeal Secretions: Not observed secondary MBS  Reason for Referral:  Objectively evaluate swallowing function   Oral Phase Oral Preparation/Oral Phase Oral Phase: Impaired Oral - Thin Oral - Thin Cup: Not tested Oral - Thin  Straw: Within functional limits Oral - Solids Oral - Puree: Within functional limits Oral - Mechanical Soft: Impaired mastication;Reduced posterior  propulsion;Weak lingual manipulation Pharyngeal Phase  Pharyngeal Phase Pharyngeal Phase: Impaired Pharyngeal - Nectar Pharyngeal - Nectar Teaspoon: Not tested Pharyngeal - Nectar Straw: Not tested Pharyngeal - Thin Pharyngeal - Thin Cup: Not tested Pharyngeal - Thin Straw: Delayed swallow initiation;Premature  spillage to valleculae;Penetration/Aspiration during swallow  Penetration/Aspiration details (thin straw): Material enters  airway, remains ABOVE vocal cords then ejected out Pharyngeal - Solids Pharyngeal - Puree: Delayed swallow initiation;Premature spillage  to valleculae Pharyngeal - Mechanical Soft: Premature spillage to  valleculae;Delayed swallow initiation Pharyngeal Phase - Comment Pharyngeal Comment: Pt with trace and intermittent flash  penetration with thin liquids via straw  Cervical Esophageal Phase  Cervical Esophageal Phase Cervical Esophageal Phase: WFL   G-Codes    PAYNE, COURTNEY 06/09/13, 9:58 AM      Recent Labs  05/16/13 1135 05/17/13 0540  WBC 3.4* 3.3*  HGB 9.7* 9.3*  HCT 29.1* 27.7*  PLT 113* 119*   No results found for this basename: NA, K, CL, CO, GLUCOSE, BUN, CREATININE, CALCIUM,  in the last 72 hours CBG (last 3)  No results found for this basename: GLUCAP,  in the last 72 hours  Wt Readings from Last 3 Encounters:  Jun 09, 2013 68.04 kg (150 lb)  04/18/13 61.961 kg (136 lb 9.6 oz)  04/18/13 61.961 kg (136 lb 9.6 oz)    Physical Exam:  Vitals reviewed.  Eyes:  Pupils reactive to light  Neck: Normal range of motion. Neck supple. No thyromegaly present.  Cardiovascular: Normal rate and regular rhythm. No murmurs Respiratory: Effort normal and breath sounds normal. No respiratory distress. No wheezes  GI: Soft. Bowel sounds are normal. She exhibits no distension.  Musculoskeletal: She exhibits no pain with shoulder range of motion , no calf or thigh tenderness to palpation or swelling   Neurological:  Patient is alert.  Better processing. fair insight and awareness  4 minus/5 in the right deltoid, bicep, tricep, grip, hip flexor, knee extensors, ankle dorsi flexion plantar flexor  Left upper extremity has 1+ pec, trace bicep, deltoid, tricep, 0 at hand. pec tone better, trace Left lower extremity has 1 hip extension, 1 to 2- HAD, HAB, 1+ to2 KE, tr to 1 distally. Mainly increased extensor tone in lowers Reduced  sensation tin LUE and LLE 2/4 tone Left pecs and LLE 1-2/4. Left heel cord remains tight   Assessment/Plan: 1. 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 Physiatrist is providing close team supervision and 24 hour management of active medical problems listed below. Physiatrist and rehab team continue to assess barriers to discharge/monitor patient progress toward functional and medical goals.  Pursuing SNF given safety and dispo situation. Making nice functional gains over the last couple days however. Much brighter and more interactive   FIM: FIM - Bathing Bathing Steps Patient Completed: Chest;Left Arm;Abdomen;Right upper leg;Left upper leg Bathing: 3: Mod-Patient completes 5-7 76f 10 parts or 50-74%  FIM - Upper Body Dressing/Undressing Upper body dressing/undressing steps patient completed: Put head through opening of pull over shirt/dress Upper body dressing/undressing: 2: Max-Patient completed 25-49% of tasks FIM - Lower Body Dressing/Undressing Lower body dressing/undressing steps patient completed: Thread/unthread left pants leg Lower body dressing/undressing: 1: Two helpers  FIM - Toileting Toileting steps completed by patient: Performs perineal hygiene Toileting  Assistive Devices: Grab bar or rail for support Toileting: 2: Max-Patient completed 1 of 3 steps  FIM - Diplomatic Services operational officer Devices: Psychiatrist Transfers: 2-To toilet/BSC: Max A (lift and lower assist);2-From toilet/BSC: Max A (lift and lower assist)  FIM - Banker Devices: Arm rests Bed/Chair Transfer: 0: Activity did not occur  FIM - Locomotion: Wheelchair Distance: 45 Locomotion: Wheelchair: 1: Travels less than 50 ft with minimal assistance (Pt.>75%) FIM - Locomotion:  Ambulation Locomotion: Ambulation Assistive Devices: Other (comment) (HHA) Ambulation/Gait Assistance: Not tested (comment) Locomotion: Ambulation: 0: Activity did not occur  Comprehension Comprehension Mode: Auditory Comprehension: 3-Understands basic 50 - 74% of the time/requires cueing 25 - 50%  of the time  Expression Expression Mode: Verbal Expression: 2-Expresses basic 25 - 49% of the time/requires cueing 50 - 75% of the time. Uses single words/gestures.  Social Interaction Social Interaction: 3-Interacts appropriately 50 - 74% of the time - May be physically or verbally inappropriate.  Problem Solving Problem Solving: 2-Solves basic 25 - 49% of the time - needs direction more than half the time to initiate, plan or complete simple activities  Memory Memory: 2-Recognizes or recalls 25 - 49% of the time/requires cueing 51 - 75% of the time   Medical Problem List and Plan:  1. 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 Status post type line embolization RICA aneurysm   -PRAFO LLE 2. DVT Prophylaxis/Anticoagulation: SCDs  3. Chronic headaches/Pain Management: continue prn tylenol.  4. Mood/Anxiety. Ativan 1 mg every 6 hours as needed--limit as possible. Paxil 50 mg daily--increased to 62.5   4. Neuropsych: This patient is not capable of making decisions on her own behalf.  5. Seizures: continue Keppra, and vimpat--dilantin rd/ced due to throbocytopenia after discussion with neuro hospitalist Dr Roseanne Reno, no evidence of focal motor seizure, if breakthough seizure will increase vimpat 6. Dysphagia: Dysphagia 2 nectar liquids. Tolerating fairly well at present-  -holding HS IVF for now--check BMET tomorrow 7. E. Coli UTI: follow up culture 8. Acute renal insufficiency. Latest creatinine 0.85.   -would like for PO to be at least 1000cc daily on a regular basis before I stop IVF--still not there yet  -po better 9.  Spasticity: baclofen reduced due to AMS 10.  Lethargy--had been improving, waxes and wanes though  -add low dose ritalin to help with day time arousal and attention 11. BRBPR: hgb 9.3, platelets up to 119- check cbc 12/19  -hold ECASA 325mg  (might have been given today)   -no recurrent bleeding yesterday LOS (Days) 14 A FACE TO FACE EVALUATION WAS PERFORMED  SWARTZ,ZACHARY T 05/19/2013 8:27 AM

## 2013-05-19 NOTE — Progress Notes (Addendum)
Occupational Therapy Session Note  Patient Details  Name: [name] MRN: [id number] Date of Birth: Jan 17, 1961  Today's Date: 05/19/2013  Session 1 Time: 0900-0915 Time Calculation (min): 15 min  Short Term Goals: Week 2:  OT Short Term Goal 1 (Week 2): Pt will transfer with max A +1 consistently  OT Short Term Goal 2 (Week 2): Pt will initiate during a functional task within 3 seconds to perform self care task OT Short Term Goal 3 (Week 2): Pt will demonstrated sustained attention for 10 min with mod multimodal cues OT Short Term Goal 4 (Week 2): Pt will use call bell for toileting needs with max multimodal cues  Skilled Therapeutic Interventions/Progress Updates:    Focus on self care retraining for donning shirt and toilet transfers.  Pt required max verbal cues for task initiation and extra time to respond to verbal cues.  Pt exhibited increased difficulty with maintaining body at midline this morning along with difficulty appropriately responding to verbal cues to correct posture.    Therapy Documentation Precautions:  Precautions Precautions: Fall Precaution Comments: L UE hemiplegia, anxiety  Restrictions Weight Bearing Restrictions: No   Pain: Pain Assessment Pain Assessment: No/denies pain  See FIM for current functional status  Therapy/Group: Individual Therapy  Session 2 Time: 1345-1415 Pt denies pain Individual therapy  Pt initially engaged in toilet transfers and toileting.  Pt required mod A and max verbal cues to perform stand pivot transfer to toilet.  Pt was able to complete hygiene but required assistance with clothing management.  Pt transitioned to engaging in static standing task using rail on right and beside wall on right.  Pt performed sit->stand with min A but required mod a for stand->sit.  Pt required max verbal cues to maintain erect posture while standing and to extend left knee.  Pt required min/mod tactile cues/A to maintain erect posture.   Pt returned to nurses station at end of session with QRB in place.    [name] [facility] 05/19/2013, 11:47 AM

## 2013-05-19 NOTE — Progress Notes (Signed)
Speech Language Pathology Daily Session Note  Patient Details  Name: [name] MRN: [id number] Date of Birth: 10/30/1960  Today's Date: 05/19/2013 Time: 1200-1230 Time Calculation (min): 30 min  Short Term Goals: Week 2: SLP Short Term Goal 1 (Week 2): Pt will utilize swallowing compensatory strategies to minimize overt s/s of aspiration with Min A verbal and question cues.  SLP Short Term Goal 1 - Progress (Week 2): Progressing toward goal SLP Short Term Goal 2 (Week 2): Pt will utilize external memory aids to recall new, daily information with Mod A question and visual cues.  SLP Short Term Goal 2 - Progress (Week 2): Progressing toward goal SLP Short Term Goal 3 (Week 2): Pt will demonstrate functional problem solving for basic and familiar tasks with Mod A multimodal cueing  SLP Short Term Goal 3 - Progress (Week 2): Progressing toward goal SLP Short Term Goal 4 (Week 2): Pt will utilize call bell to request assistance with Mod verbal and question cues. SLP Short Term Goal 4 - Progress (Week 2): Progressing toward goal SLP Short Term Goal 5 (Week 2): Pt will attend sustain attention to a functional task for 30 minutes with Mod A verbal cues for redirection  SLP Short Term Goal 5 - Progress (Week 2): Progressing toward goal SLP Short Term Goal 6 (Week 2): Pt will utilize speech intelligibility strategies at the phrase level with Mod I.   Skilled Therapeutic Interventions: Pt participated on co-treatment with OT in diners club with focus on self-feeding and dysphagia goals. Pt consumed Dys. 2 textures with thin liquids via straw with strong coughing episode x1, suspect related to perseverative behavior while consuming PO. Pt required Max cues for attention to left, sustained attention to self-feeding task, awareness of anterior loss of both PO and saliva, and awareness of whether or not there was food on her utensil or not. Pt required almost 90 minutes for consumption of lunch meal.  Recommend to continue with nectar thick liquids with trials of thin liquids with SLP only due to current cognition.    FIM:  Comprehension Comprehension Mode: Auditory Comprehension: 2-Understands basic 25 - 49% of the time/requires cueing 51 - 75% of the time Expression Expression Mode: Verbal Expression: 2-Expresses basic 25 - 49% of the time/requires cueing 50 - 75% of the time. Uses single words/gestures. Social Interaction Social Interaction: 2-Interacts appropriately 25 - 49% of time - Needs frequent redirection. Problem Solving Problem Solving: 2-Solves basic 25 - 49% of the time - needs direction more than half the time to initiate, plan or complete simple activities Memory Memory: 2-Recognizes or recalls 25 - 49% of the time/requires cueing 51 - 75% of the time FIM - Eating Eating Activity: 4: Help with managing cup/glass;4: Helper occasionally scoops food on utensil;4: Helper checks for pocketed food;5: Needs verbal cues/supervision  Pain Pain Assessment Pain Assessment: 0-10 Pain Score: 2  Pain Type: Acute pain Pain Location: Leg Pain Orientation: Right;Left Pain Descriptors / Indicators: Aching Pain Frequency: Occasional Pain Onset: Gradual Patients Stated Pain Goal: 2 Pain Intervention(s): Medication (See eMAR);Repositioned  Therapy/Group: Group Therapy   Maxcine Ham, M.A. CCC-SLP [phone]   Maxcine Ham 05/19/2013, 4:28 PM
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
## 2013-05-20 ENCOUNTER — Non-Acute Institutional Stay (SKILLED_NURSING_FACILITY): Payer: PRIVATE HEALTH INSURANCE | Admitting: Internal Medicine

## 2013-05-20 DIAGNOSIS — M62838 Other muscle spasm: Secondary | ICD-10-CM

## 2013-05-20 DIAGNOSIS — F419 Anxiety disorder, unspecified: Secondary | ICD-10-CM

## 2013-05-20 DIAGNOSIS — C719 Malignant neoplasm of brain, unspecified: Secondary | ICD-10-CM

## 2013-05-20 DIAGNOSIS — F411 Generalized anxiety disorder: Secondary | ICD-10-CM

## 2013-05-20 DIAGNOSIS — R569 Unspecified convulsions: Secondary | ICD-10-CM

## 2013-05-20 DIAGNOSIS — E43 Unspecified severe protein-calorie malnutrition: Secondary | ICD-10-CM

## 2013-05-20 DIAGNOSIS — I609 Nontraumatic subarachnoid hemorrhage, unspecified: Secondary | ICD-10-CM

## 2013-05-20 LAB — CBC
Hemoglobin: 9.7 g/dL — ABNORMAL LOW (ref 12.0–15.0)
MCH: 31.1 pg (ref 26.0–34.0)
MCHC: 33.8 g/dL (ref 30.0–36.0)
MCV: 92 fL (ref 78.0–100.0)
RBC: 3.12 MIL/uL — ABNORMAL LOW (ref 3.87–5.11)

## 2013-05-20 LAB — BASIC METABOLIC PANEL
BUN: 27 mg/dL — ABNORMAL HIGH (ref 6–23)
CO2: 23 mEq/L (ref 19–32)
Calcium: 9.3 mg/dL (ref 8.4–10.5)
Creatinine, Ser: 1.12 mg/dL — ABNORMAL HIGH (ref 0.50–1.10)
Glucose, Bld: 107 mg/dL — ABNORMAL HIGH (ref 70–99)

## 2013-05-20 MED ORDER — DEXAMETHASONE 0.5 MG PO TABS
1.0000 mg | ORAL_TABLET | Freq: Two times a day (BID) | ORAL | Status: DC
  Filled 2013-05-20 (×2): qty 2

## 2013-05-20 NOTE — Progress Notes (Signed)
Subjective/Complaints: Slow to awaken this am (very common presentation). A 12 point review of systems has been performed and if not noted above is otherwise negative.   Objective: Vital Signs: Blood pressure 144/79, pulse 100, temperature 98.4 F (36.9 C), temperature source Oral, resp. rate 18, height 5\' 4"  (1.626 m), weight 68.04 kg (150 lb), last menstrual period 08/26/2010, SpO2 100.00%. Dg Chest 2 View  05/18/2013   CLINICAL DATA:  Nursing home placement.  Post stroke.  EXAM: CHEST  2 VIEW  COMPARISON:  08/30/2010.  FINDINGS: There is improved aeration of the lungs. The heart size is stable at the upper limits of normal. The mediastinal contours are normal. There is no confluent airspace opacity, pleural effusion or pneumothorax. No osseous abnormalities are identified.  IMPRESSION: Stable borderline heart size.  No acute cardiopulmonary process.   Electronically Signed   By: Roxy Horseman M.D.   On: 05/18/2013 20:59   Dg Swallowing Func-speech Pathology  05/18/2013   Huston Foley, CCC-SLP     05/18/2013  9:59 AM Objective Swallowing Evaluation: Modified Barium Swallowing Study   Patient Details  Name: [name] MRN: [id number] Date of Birth: 1961-04-11  Today's Date: 05/18/2013 Time: 0900-0930 Time Calculation (min): 30 min  Past Medical History:  Past Medical History  Diagnosis Date  . 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 Fracture, ankle 2012    right  . History of radiation therapy 09/23/10- 11/07/10    right parietal lobe  . Chronic headaches     uses oxycodone appropriately   Past Surgical History:  Past Surgical History  Procedure Laterality Date  . Orif ankle fracture  08/22/2010    right  . Radiology with anesthesia N/A 05/02/2013    Procedure: RADIOLOGY WITH ANESTHESIA;  Surgeon: [name], MD;  Location: [facility] OR;  Service: Radiology;   Laterality: N/A;   HPI:  52 yr old patient taken to the ED 04/18/13 because of  incoordination and falls up to the  point that according to her  husband she is getting more sleepy. 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 CT head in the ED shows SAH, most  likely traumatic with some fluid in the cyst.  MRI showed acute  right MCA infarct with edema and perhaps some petechial  hemorrhage, acute right subdural hematoma measuring 5 mm in  thickness and superimposed on a small chronic postoperative  extra-axial collection, acute subarachnoid hemorrhage, more so in  the right hemisphere, trace intraventricular hemorrhage,  suggestion of a small volume of hemorrhage within the chronic  right hemisphere treatment site, trace leftward midline shift. No  ventriculomegaly.  MBS on 11/22 and recommended Dys. 2 textures  with nectar-thick liquids. Pt transferred to CIR 05/05/13 and has  been participating in dysphagia treatment. Recommend repeat MBS  today to assess for possible upgrade.      Recommendation/Prognosis  Clinical Impression Dysphagia Diagnosis: Moderate oral phase dysphagia;Mild  pharyngeal phase dysphagia Clinical impression: Pt demonstrates a moderate oral and mild  pharyngeal dysphagia. Pt's oral phase is characterized by  decreased bolus formation and mastication with extended posterior  propulsion with mechanical soft textures due to left lingual and  labial weakness.  Pt's pharyngeal phase is characterized by  delayed swallow initiation with all consistencies leading to  trace and intermittent flash penetration with thin liquids via  straw.  Recommend pt  utilize straw to increase overall oral  control. Recommend Dys.2 textures with thin liquids  with full  supervision for utilization of swallowing compensatory  strategies.  Swallow Evaluation Recommendations Diet Recommendations: Dysphagia 2 (Fine chop);Thin liquid Liquid Administration via: Cup;Straw Medication Administration: Whole meds with puree Supervision: Full supervision/cueing for compensatory  strategies;Patient able to self feed Compensations:  Slow rate;Small sips/bites;Check for  pocketing;Check for anterior loss Postural Changes and/or Swallow Maneuvers: Seated upright 90  degrees;Upright 30-60 min after meal;Out of bed for meals Oral Care Recommendations: Oral care before and after PO Prognosis Prognosis for Safe Diet Advancement: Good Individuals Consulted Consulted and Agree with Results and Recommendations: Patient  SLP Assessment/Plan   Short Term Goals: Week 2: SLP Short Term Goal 1 (Week 2): Pt will utilize  swallowing compensatory strategies to minimize overt s/s of  aspiration with Min A verbal and question cues.  SLP Short Term Goal 2 (Week 2): Pt will utilize external memory  aids to recall new, daily information with Mod A question and  visual cues.  SLP Short Term Goal 3 (Week 2): Pt will demonstrate functional  problem solving for basic and familiar tasks with Mod A  multimodal cueing  SLP Short Term Goal 4 (Week 2): Pt will utilize call bell to  request assistance with Mod verbal and question cues. SLP Short Term Goal 5 (Week 2): Pt will attend sustain attention  to a functional task for 30 minutes with Mod A verbal cues for  redirection  SLP Short Term Goal 6 (Week 2): Pt will utilize speech  intelligibility strategies at the phrase level with Mod I.   General:  Date of Onset: 04/18/13 HPI: 51 yr old patient taken to the ED 04/18/13 because of  incoordination and falls up to the point that according to her  husband she is getting more sleepy. 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 CT head in the ED shows SAH, most  likely traumatic with some fluid in the cyst.  MRI showed acute  right MCA infarct with edema and perhaps some petechial  hemorrhage, acute right subdural hematoma measuring 5 mm in  thickness and superimposed on a small chronic postoperative  extra-axial collection, acute subarachnoid hemorrhage, more so in  the right hemisphere, trace intraventricular hemorrhage,  suggestion of a small volume of hemorrhage  within the chronic  right hemisphere treatment site, trace leftward midline shift. No  ventriculomegaly.  MBS on 11/22 and recommended Dys. 2 textures  with nectar-thick liquids. Pt transferred to CIR 05/05/13 and has  been participating in dysphagia treatment. Recommend repeat MBS  today to assess for possible upgrade.  Type of Study: Modified Barium Swallowing Study Reason for Referral: Objectively evaluate swallowing function Previous Swallow Assessment: MBS on 11/22 and recommended Dys. 2  textures with nectar-thick liquids  Diet Prior to this Study: Dysphagia 2 (chopped);Nectar-thick  liquids Temperature Spikes Noted: No Respiratory Status: Room air History of Recent Intubation: No Behavior/Cognition: Alert;Cooperative;Pleasant mood Oral Cavity - Dentition: Adequate natural dentition Oral Motor / Sensory Function: Impaired - see Bedside swallow  eval Self-Feeding Abilities: Able to feed self;Needs assist Patient Positioning: Upright in chair Baseline Vocal Quality: Clear Volitional Cough: Weak Volitional Swallow: Able to elicit Anatomy: Within functional limits Pharyngeal Secretions: Not observed secondary MBS  Reason for Referral:  Objectively evaluate swallowing function   Oral Phase Oral Preparation/Oral Phase Oral Phase: Impaired Oral - Thin Oral - Thin Cup: Not tested Oral - Thin Straw: Within functional limits Oral - Solids Oral - Puree: Within functional limits Oral - Mechanical Soft: Impaired mastication;Reduced posterior  propulsion;Weak lingual manipulation Pharyngeal Phase  Pharyngeal Phase Pharyngeal Phase: Impaired Pharyngeal - Nectar Pharyngeal - Nectar Teaspoon: Not tested Pharyngeal - Nectar Straw: Not tested Pharyngeal - Thin Pharyngeal - Thin Cup: Not tested Pharyngeal - Thin Straw: Delayed swallow initiation;Premature  spillage to valleculae;Penetration/Aspiration during swallow Penetration/Aspiration details (thin straw): Material enters  airway, remains ABOVE vocal cords then ejected out  Pharyngeal - Solids Pharyngeal - Puree: Delayed swallow initiation;Premature spillage  to valleculae Pharyngeal - Mechanical Soft: Premature spillage to  valleculae;Delayed swallow initiation Pharyngeal Phase - Comment Pharyngeal Comment: Pt with trace and intermittent flash  penetration with thin liquids via straw  Cervical Esophageal Phase  Cervical Esophageal Phase Cervical Esophageal Phase: WFL   G-Codes    PAYNE, COURTNEY May 19, 2013, 9:58 AM      Recent Labs  05/20/13 0530  WBC 5.9  HGB 9.7*  HCT 28.7*  PLT 177    Recent Labs  05/20/13 0530  NA 142  K 4.1  CL 109  GLUCOSE 107*  BUN 27*  CREATININE 1.12*  CALCIUM 9.3   CBG (last 3)  No results found for this basename: GLUCAP,  in the last 72 hours  Wt Readings from Last 3 Encounters:  05-19-2013 68.04 kg (150 lb)  04/18/13 61.961 kg (136 lb 9.6 oz)  04/18/13 61.961 kg (136 lb 9.6 oz)    Physical Exam:  Vitals reviewed.  Eyes:  Pupils reactive to light  Neck: Normal range of motion. Neck supple. No thyromegaly present.  Cardiovascular: Normal rate and regular rhythm. No murmurs Respiratory: Effort normal and breath sounds normal. No respiratory distress. No wheezes  GI: Soft. Bowel sounds are normal. She exhibits no distension.  Musculoskeletal: She exhibits no pain with shoulder range of motion , no calf or thigh tenderness to palpation or swelling   Neurological:  Patient is alert.  Better processing. fair insight and awareness  4 minus/5 in the right deltoid, bicep, tricep, grip, hip flexor, knee extensors, ankle dorsi flexion plantar flexor  Left upper extremity has 1+ pec, trace bicep, deltoid, tricep, 0 at hand. pec tone better, trace Left lower extremity has 1 hip extension, 1 to 2- HAD, HAB, 1+ to2 KE, tr to 1 distally. Mainly increased extensor tone in lowers Reduced sensation tin LUE and LLE 2/4 tone Left pecs and LLE 1-2/4. Left heel cord remains tight   Assessment/Plan: 1. 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 Physiatrist is providing close team supervision and 24 hour management of active medical problems listed below. Physiatrist and rehab team continue to assess barriers to discharge/monitor patient progress toward functional and medical goals.  SNF today  FIM: FIM - Bathing Bathing Steps Patient Completed: Chest;Left Arm;Abdomen;Right upper leg;Left upper leg Bathing: 3: Mod-Patient completes 5-7 71f 10 parts or 50-74%  FIM - Upper Body Dressing/Undressing Upper body dressing/undressing steps patient completed: Put head through opening of pull over shirt/dress Upper body dressing/undressing: 2: Max-Patient completed 25-49% of tasks FIM - Lower Body Dressing/Undressing Lower body dressing/undressing steps patient completed: Thread/unthread left pants leg Lower body dressing/undressing: 1: Two helpers  FIM - Toileting Toileting steps completed by patient: Performs perineal hygiene Toileting Assistive Devices: Grab bar or rail for support Toileting: 1: Two helpers  FIM - Diplomatic Services operational officer Devices: Psychiatrist Transfers: 1-Two helpers  FIM - Banker Devices: Arm rests Bed/Chair  Transfer: 1: Two helpers  FIM - Locomotion: Wheelchair Distance: 75' Locomotion: Wheelchair: 1: Total Assistance/staff pushes wheelchair (Pt<25%) FIM - Locomotion: Ambulation Locomotion: Ambulation Assistive Devices: Other (comment) (HHA) Ambulation/Gait Assistance: 1: +2 Total assist Locomotion: Ambulation: 1: Two helpers  Comprehension Comprehension Mode: Auditory Comprehension: 2-Understands basic 25 - 49% of the time/requires cueing 51 - 75% of the time  Expression Expression Mode: Verbal Expression: 2-Expresses basic 25 - 49% of the time/requires  cueing 50 - 75% of the time. Uses single words/gestures.  Social Interaction Social Interaction: 2-Interacts appropriately 25 - 49% of time - Needs frequent redirection.  Problem Solving Problem Solving: 2-Solves basic 25 - 49% of the time - needs direction more than half the time to initiate, plan or complete simple activities  Memory Memory: 2-Recognizes or recalls 25 - 49% of the time/requires cueing 51 - 75% of the time   Medical Problem List and Plan:  1. 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 Status post type line embolization RICA aneurysm   -PRAFO LLE 2. DVT Prophylaxis/Anticoagulation: SCDs  3. Chronic headaches/Pain Management: continue prn tylenol.  4. Mood/Anxiety. Ativan 1 mg every 6 hours as needed--limit as possible. Paxil 50 mg daily--increased to 62.5   4. Neuropsych: This patient is not capable of making decisions on her own behalf.  5. Seizures: continue Keppra, and vimpat--dilantin rd/ced due to throbocytopenia after discussion with neuro hospitalist Dr Roseanne Reno, no evidence of focal motor seizure, if breakthough seizure will increase vimpat 6. Dysphagia: Dysphagia 2 nectar liquids. Tolerating fairly well at present-  -holding HS IVF for now--need to PUSH po fluids 7. E. Coli UTI: follow up culture 8. Acute renal insufficiency. Latest creatinine 0.85.   -would like for PO to be at least 1000cc daily on a regular basis before I stop IVF--still not there yet  -po better 9. Spasticity: dc baclofen 10.  Lethargy--had been improving, waxes and wanes though  -added low dose ritalin to help with day time arousal and attention 11. BRBPR: hgb 9.3, platelets up to 119- check cbc 12/19  -resume ECASA 325mg   -no recurrent bleeding yesterday LOS (Days) 15 A FACE TO FACE EVALUATION WAS PERFORMED  SWARTZ,ZACHARY T 05/20/2013 8:36 AM

## 2013-05-20 NOTE — Progress Notes (Signed)
Social Work  Discharge Note  The overall goal for the admission was met for:   Discharge location: NO - d/c plan changed to SNF  Length of Stay: Yes - 15 days  Discharge activity level: No - not all goals met  Home/community participation: No  Services provided included: MD, RD, PT, OT, SLP, RN, TR, Pharmacy and [employer]  Financial Services: Private Insurance: [employer]  Follow-up services arranged: Other: SNF @ [facility]  Comments (or additional information):  Patient/Family verbalized understanding of follow-up arrangements: Yes  Individual responsible for coordination of the follow-up plan: husband  Confirmed correct DME delivered: NA    Freda Jaquith

## 2013-05-20 NOTE — Progress Notes (Signed)
Physical Therapy Discharge Summary  Patient Details  Name: [name] MRN: [id number] Date of Birth: [birth year]  Today's Date: 05/20/2013  Patient has met 0 of 6 long term goals due to inconsistent performance of physical mobility since initial evaluation. During pt's LOS, she demonstrated a brief period where she was able to perform standing functional mobility w/ mod-max Ax1person, however, currently continues to req Ax2 persons due to decreased functional endurance, decreased balance, decreased strength, decreased coordination, decreased initiation/termination, decreased orientation to midline, L inattention, decreased sustained attention as well as decreased intellectual/emergent awareness. Patient to discharge at a wheelchair level req Ax2persons.  Patient's care partner unable to provide the necessary physical and cognitive assistance at discharge.  Reasons goals not met: Inconsistent performance of functional mobility and cognition.  Recommendation:  Patient will benefit from ongoing skilled PT services in skilled nursing facility setting to continue to advance safe functional mobility, address ongoing impairments in decreased functional endurance, decreased balance, decreased strength, decreased coordination, decreased awareness, decreased attention, and minimize fall risk.  Equipment: No equipment provided  Reasons for discharge: discharge from hospital  Patient/family agrees with progress made and goals achieved: Yes  PT Discharge Precautions/Restrictions Precautions Precautions: Fall Precaution Comments: L hemiplegia Restrictions Weight Bearing Restrictions: No Vital Signs Therapy Vitals Temp: 97.8 F (36.6 C) Temp src: Oral Pulse Rate: 90 Resp: 18 BP: 151/93 mmHg Patient Position, if appropriate: Lying Oxygen Therapy SpO2: 100 % O2 Device: None (Room air) Pain   Vision/Perception  Vision - History Baseline Vision: Wears glasses all the time Patient Visual  Report: No change from baseline Perception Perception: Impaired Inattention/Neglect: Does not attend to left visual field;Does not attend to left side of body Praxis Praxis: Impaired Praxis Impairment Details: Motor planning;Initiation;Perseveration  Cognition Overall Cognitive Status: Impaired/Different from baseline Arousal/Alertness: Awake/alert Orientation Level: Oriented to person;Oriented to place;Oriented to situation;Disoriented to time Attention: Focused;Sustained Focused Attention: Appears intact Sustained Attention: Impaired Sustained Attention Impairment: Verbal basic;Functional basic Selective Attention: Impaired Selective Attention Impairment: Verbal basic;Functional basic Memory: Impaired Memory Impairment: Decreased recall of new information;Decreased short term memory Decreased Short Term Memory: Functional basic Awareness: Impaired Awareness Impairment: Intellectual impairment;Emergent impairment;Anticipatory impairment Problem Solving: Impaired Problem Solving Impairment: Verbal basic;Functional basic Executive Function: Sequencing;Reasoning;Self Monitoring;Self Correcting;Organizing;Decision Making;Initiating Reasoning: Impaired Reasoning Impairment: Verbal basic;Functional basic Sequencing: Impaired Sequencing Impairment: Verbal basic;Functional basic Organizing: Impaired Organizing Impairment: Verbal basic;Functional basic Decision Making: Impaired Decision Making Impairment: Verbal basic;Functional basic Initiating: Impaired Initiating Impairment: Verbal basic;Functional basic Self Monitoring: Impaired Self Monitoring Impairment: Verbal basic;Functional basic Self Correcting: Impaired Self Correcting Impairment: Verbal basic;Functional basic Behaviors: Lability;Perseveration Safety/Judgment: Impaired Sensation Sensation Light Touch: Impaired Detail Light Touch Impaired Details: Impaired LUE;Impaired LLE Proprioception: Impaired  Detail Proprioception Impaired Details: Impaired LUE;Impaired LLE Coordination Gross Motor Movements are Fluid and Coordinated: No Fine Motor Movements are Fluid and Coordinated: No Motor  Motor Motor: Hemiplegia;Abnormal tone;Motor apraxia Motor - Skilled Clinical Observations: L hemiplegia, increased tone noted in L LE and UE  Mobility Bed Mobility Bed Mobility: Supine to Sit;Sit to Supine;Sitting - Scoot to Edge of Bed Supine to Sit: 1: +1 Total assist;HOB elevated;With rails Supine to Sit Details: Tactile cues for sequencing;Tactile cues for placement;Verbal cues for precautions/safety;Verbal cues for sequencing;Tactile cues for initiation Sitting - Scoot to Edge of Bed: 1: +1 Total assist Sitting - Scoot to Edge of Bed Details: Verbal cues for sequencing;Verbal cues for precautions/safety;Tactile cues for sequencing;Tactile cues for placement;Tactile cues for initiation Sit to Supine: 1: +1 Total assist;2: Max assist Sit to Supine -  Details: Tactile cues for initiation;Tactile cues for sequencing;Tactile cues for placement;Verbal cues for sequencing;Verbal cues for precautions/safety Transfers Transfers: Yes Sit to Stand: 2: Max assist Sit to Stand Details: Tactile cues for initiation;Tactile cues for sequencing;Tactile cues for weight shifting;Verbal cues for sequencing;Verbal cues for precautions/safety Stand to Sit: 2: Max assist Stand to Sit Details (indicate cue type and reason): Tactile cues for sequencing;Tactile cues for initiation;Tactile cues for weight shifting;Verbal cues for precautions/safety;Verbal cues for sequencing Stand Pivot Transfers: 1: +2 Total assist Stand Pivot Transfer Details: Tactile cues for initiation;Tactile cues for sequencing;Tactile cues for weight shifting;Verbal cues for sequencing;Verbal cues for precautions/safety;Manual facilitation for placement;Manual facilitation for weight bearing;Tactile cues for placement Squat Pivot Transfers: 1: +2 Total  assist Squat Pivot Transfer Details: Tactile cues for initiation;Tactile cues for sequencing;Tactile cues for placement;Tactile cues for weight shifting;Verbal cues for precautions/safety;Verbal cues for sequencing;Manual facilitation for placement Locomotion  Ambulation Ambulation: Yes Ambulation/Gait Assistance: 1: +2 Total assist Ambulation Distance (Feet): 50 Feet Assistive device: Other (Comment) (3 Musketeer ) Ambulation/Gait Assistance Details: Tactile cues for initiation;Verbal cues for sequencing;Verbal cues for technique;Verbal cues for gait pattern;Manual facilitation for placement;Manual facilitation for weight shifting;Verbal cues for precautions/safety;Tactile cues for weight shifting;Tactile cues for weight beaing;Tactile cues for placement;Manual facilitation for weight bearing;Tactile cues for sequencing;Tactile cues for posture Gait Gait: Yes Gait Pattern: Impaired Gait Pattern: Step-through pattern;Step-to pattern;Decreased step length - right;Decreased step length - left;Decreased weight shift to right;Scissoring;Narrow base of support;Trunk flexed Gait velocity: decreased; pt demonstrating hard L lean during amb; gross advancement of B LE  Stairs / Additional Locomotion Stairs: No (Pt did not negotiate stairs PTA) Wheelchair Mobility Wheelchair Mobility: Yes Wheelchair Assistance: 1: +1 Total assist;2: Max Chiropodist Details: Tactile cues for sequencing;Tactile cues for placement;Verbal cues for technique Wheelchair Propulsion: Right upper extremity;Right lower extremity Wheelchair Parts Management: Needs assistance Distance: 51'  Trunk/Postural Assessment  Cervical Assessment Cervical Assessment: Within Functional Limits Thoracic Assessment Thoracic Assessment: Within Functional Limits Lumbar Assessment Lumbar Assessment: Within Functional Limits Postural Control Postural Control: Deficits on evaluation Protective Responses: delayed   Balance Balance Balance Assessed: Yes Static Sitting Balance Static Sitting - Balance Support: Feet supported;Right upper extremity supported Static Sitting - Level of Assistance: 5: Stand by assistance;4: Min assist Static Sitting - Comment/# of Minutes: Pt demonstrating hard L lean w/ cues to initiate correction, however, unable to sustain Dynamic Sitting Balance Dynamic Sitting - Balance Support: No upper extremity supported;Feet supported Dynamic Sitting - Level of Assistance: 4: Min assist;5: Stand by assistance Dynamic Sitting - Balance Activities: Lateral lean/weight shifting;Forward lean/weight shifting;Reaching for objects Dynamic Sitting - Comments: Pt demonstrating hard L lean w/ cues to initiate correction, however, unable to sustain Static Standing Balance Static Standing - Balance Support: Bilateral upper extremity supported;Right upper extremity supported Static Standing - Level of Assistance: 2: Max assist;1: +1 Total assist Dynamic Standing Balance Dynamic Standing - Balance Support: During functional activity;Bilateral upper extremity supported Dynamic Standing - Level of Assistance: 1: +1 Total assist;1: +2 Total assist Dynamic Standing - Balance Activities: Lateral lean/weight shifting;Forward lean/weight shifting;Reaching for objects Extremity Assessment  RUE Assessment RUE Assessment: Within Functional Limits LUE Assessment LUE Assessment: Exceptions to [facility] LUE Strength LUE Overall Strength:  (Brunstrom II on day of discharge) LUE Tone LUE Tone: Mild;Modified Ashworth Modified Ashworth Scale for Grading Hypertonia LUE: Slight increase in muscle tone, manifested by a catch and release or by minimal resistance at the end of the range of motion when the affected part(s) is moved in flexion or  extension RLE Assessment RLE Assessment: Within Functional Limits LLE Assessment LLE Assessment: Exceptions to [facility] LLE Strength LLE Overall Strength Comments: Brunstrom  stage V; Pt demonstrates active movement, however, significatnly limited by impaired motor planning and cognition LLE Tone LLE Tone: Moderate;Hypertonic  See FIM for current functional status  Denzil Hughes 05/20/2013, 4:13 PM

## 2013-05-20 NOTE — Plan of Care (Signed)
Problem: RH BOWEL ELIMINATION Goal: RH STG MANAGE BOWEL WITH ASSISTANCE STG Manage Bowel with mod Assistance.  Outcome: Not Met (add Reason) Suppository given

## 2013-05-20 NOTE — Plan of Care (Signed)
Problem: RH BOWEL ELIMINATION Goal: RH STG MANAGE BOWEL WITH ASSISTANCE STG Manage Bowel with mod Assistance.  Outcome: Not Met (add Reason) Suppository given     

## 2013-05-20 NOTE — Progress Notes (Signed)
Speech Language Pathology Discharge Summary  Patient Details  Name: [name] MRN: [id number] Date of Birth: 1961/03/19  Today's Date: 05/20/2013  Patient has met 3 of 6 long term goals.  Patient to discharge at overall Mod;Max level.   Reasons goals not met: N/A   Clinical Impression/Discharge Summary: Pt has made small and inconsistent gains and has met 3 of 6 LTG's this reporting period. Currently, pt is consuming Dys. 2 textures with nectar-thick liquids and requires Mod-Max cueing for utilization of swallowing compensatory strategies. Pt had repeat MBS on 05/18/13 and had intermittent, flash penetration with small, single sips. However, pt unable to utilize throughout the meals and demonstrated overt s/s of aspiration, therefore, pt downgraded back to nectar-thick liquids. Pt also requires overall Mod-Max cueing for initiation, attention, attention to left field of environment/body, awareness, functional problem solving, working memory and to decrease perseveration. Pt also requires extra time to complete all tasks. Pt demonstrated increased overall speech intelligibility due to increased vocal intensity and is ~100% intelligible at the sentence level. Pt would benefit from f/u skilled SLP intervention to maximize cognitive and swallowing function and overall functional independence.   Care Partner:  Caregiver Able to Provide Assistance: No  Type of Caregiver Assistance: Physical;Cognitive  Recommendation:  24 hour supervision/assistance;Skilled Nursing facility  Rationale for SLP Follow Up: Maximize functional communication;Maximize cognitive function and independence;Reduce caregiver burden;Maximize swallowing safety   Equipment: N/A   Reasons for discharge: Discharged from hospital   Patient/Family Agrees with Progress Made and Goals Achieved: Yes   See FIM for current functional status  [name] 05/20/2013, 4:00 PM

## 2013-05-20 NOTE — Progress Notes (Signed)
Report called to [facility]. No questions noted. [name] aware of transfer out to SNF today. More lethargic and difficult to arouse. Dan Finland PAC notified of change in behavior. Noted she has been like this for several days and is usually hard to arouse in the am and will pick up in the afternoon. MD aware of status and new Orders noted. PT will answer yes and ok to questions however is not her usual chatty self. VSS. SNF made aware of lethargy this morning . Pt packed up and dressed by NT, ready for discharge. [name]

## 2013-05-20 NOTE — Progress Notes (Signed)
Occupational Therapy Discharge Summary  Patient Details  Name: [name] MRN: [id number] Date of Birth: 1960/10/13  Today's Date: 05/20/2013    Patient has met 1 of 13 long term goals due to due to inconsistent performance of physical mobility since initial evaluation. During pt's LOS, she demonstrated a brief period where she was able to perform basic ADL tasks and functional mobility at mod to max A of +1 and could even ambulate with +1  however, currently continues to req Ax2 persons due to decreased functional endurance, decreased balance, decreased strength, decreased coordination, decreased initiation/termination, decreased orientation to midline, L inattention, decreased sustained attention as well as decreased intellectual/emergent awareness, attention to left body and environment, slow to process, decr attention to task, decr functional problem solve. Pt currently required +2 for basic bathing and dressing tasks at sink level.  Patient to discharge at a wheelchair level req Ax2persons. Patient's care partner unable to provide the necessary physical and cognitive assistance at discharge.   Reasons goals not met: see above, inconsistencies in functional mobility and cognition.  Recommendation:  Patient will benefit from ongoing skilled OT services in skilled nursing facility setting to continue to advance functional skills in the area of BADL and Reduce care partner burden.  Equipment: No equipment provided  Reasons for discharge: discharge from hospital  Patient/family agrees with progress made and goals achieved: Yes  OT Discharge Precautions/Restrictions  Precautions Precautions: Fall Precaution Comments: L hemiplegia Restrictions Weight Bearing Restrictions: No Pain  no c/o pain  ADL  see FIM Vision/Perception  Vision - History Baseline Vision: Wears glasses all the time Patient Visual Report: No change from baseline Vision - Assessment Eye Alignment: Within  Functional Limits Perception Perception: Impaired Inattention/Neglect: Does not attend to left visual field;Does not attend to left side of body Praxis Praxis: Impaired Praxis Impairment Details: Motor planning;Perseveration;Initiation  Cognition Overall Cognitive Status: Impaired/Different from baseline Arousal/Alertness: Awake/alert Orientation Level: Oriented to person;Oriented to place;Oriented to situation;Disoriented to time Attention: Focused;Sustained Focused Attention: Appears intact Sustained Attention: Impaired Sustained Attention Impairment: Verbal basic;Functional basic Selective Attention: Impaired Selective Attention Impairment: Verbal basic;Functional basic Memory: Impaired Memory Impairment: Decreased recall of new information;Decreased short term memory Decreased Short Term Memory: Functional basic Awareness: Impaired Awareness Impairment: Intellectual impairment;Emergent impairment;Anticipatory impairment Problem Solving: Impaired Problem Solving Impairment: Verbal basic;Functional basic Executive Function: Sequencing;Reasoning;Self Monitoring;Self Correcting;Organizing;Decision Making;Initiating Reasoning: Impaired Reasoning Impairment: Verbal basic;Functional basic Sequencing: Impaired Sequencing Impairment: Verbal basic;Functional basic Organizing: Impaired Organizing Impairment: Verbal basic;Functional basic Decision Making: Impaired Decision Making Impairment: Verbal basic;Functional basic Initiating: Impaired Initiating Impairment: Verbal basic;Functional basic Self Monitoring: Impaired Self Monitoring Impairment: Verbal basic;Functional basic Self Correcting: Impaired Self Correcting Impairment: Verbal basic;Functional basic Behaviors: Lability;Perseveration Safety/Judgment: Impaired Sensation Sensation Light Touch Impaired Details: Impaired LUE;Impaired LLE Proprioception: Impaired Detail Proprioception Impaired Details: Impaired LUE;Impaired  LLE Coordination Gross Motor Movements are Fluid and Coordinated: No Fine Motor Movements are Fluid and Coordinated: No Motor  Motor Motor: Hemiplegia;Abnormal tone;Motor apraxia Motor - Skilled Clinical Observations: L hemiplegia, increased tone noted in L LE and UE Mobility  Transfers Transfers: Sit to Stand;Stand to Sit Sit to Stand: 2: Max assist;1: +2 Total assist Stand to Sit: 2: Max assist;1: +2 Total assist  Trunk/Postural Assessment  Cervical Assessment Cervical Assessment:  (titled downward and towards the right) Thoracic Assessment Thoracic Assessment:  (rounded shoulders) Lumbar Assessment Lumbar Assessment: Exceptions to [facility] (posterior pelvic tilt) Postural Control Righting Reactions: poor, delayed Protective Responses: absent  Balance Static Sitting Balance Static Sitting - Level of Assistance:  4: Min assist;5: Stand by assistance Dynamic Sitting Balance Dynamic Sitting - Level of Assistance: 4: Min assist;3: Mod assist Static Standing Balance Static Standing - Level of Assistance: 2: Max assist;1: +1 Total assist;1: +2 Total assist Dynamic Standing Balance Dynamic Standing - Level of Assistance: 1: +2 Total assist Extremity/Trunk Assessment RUE Assessment RUE Assessment: Within Functional Limits LUE Assessment LUE Assessment: Exceptions to [facility] LUE Strength LUE Overall Strength:  (Brunstrom II on day of discharge) LUE Tone LUE Tone: Mild;Modified Ashworth Modified Ashworth Scale for Grading Hypertonia LUE: Slight increase in muscle tone, manifested by a catch and release or by minimal resistance at the end of the range of motion when the affected part(s) is moved in flexion or extension  See FIM for current functional status  Roney Mans [facility] 05/20/2013, 2:17 PM

## 2013-05-22 ENCOUNTER — Encounter: Payer: Self-pay | Admitting: Internal Medicine

## 2013-05-22 DIAGNOSIS — R569 Unspecified convulsions: Secondary | ICD-10-CM | POA: Insufficient documentation

## 2013-05-22 DIAGNOSIS — F419 Anxiety disorder, unspecified: Secondary | ICD-10-CM | POA: Insufficient documentation

## 2013-05-22 DIAGNOSIS — M62838 Other muscle spasm: Secondary | ICD-10-CM | POA: Insufficient documentation

## 2013-05-22 NOTE — Assessment & Plan Note (Signed)
Will have nutrition assess for supplements to diet

## 2013-05-22 NOTE — Assessment & Plan Note (Signed)
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

## 2013-05-22 NOTE — Assessment & Plan Note (Signed)
Will continue paxil and ativan

## 2013-05-22 NOTE — Assessment & Plan Note (Addendum)
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

## 2013-05-22 NOTE — Assessment & Plan Note (Signed)
Pt was on baclofen but not on it any longer

## 2013-05-22 NOTE — Progress Notes (Signed)
MRN: [id number] Name: [name]  Sex: female Age: 52 y.o. DOB: [birth year]  PSC #: Ronni Rumble Facility/Room: 123A Level Of Care: SNF Provider: Merrilee Seashore D Emergency Contacts: Extended Emergency Contact Information Primary Emergency Contact: Kresse,Tim Address: [address]          [place], [place] [address] [place] Home Phone: [phone] Mobile Phone: [phone] Relation: Spouse  Code Status:   Allergies: Vasotec  Chief Complaint  Patient presents with  . nursing home admission    HPI: Patient is 52 y.o. female who is s/p R MCA infarct, acute on chronic subdural hematoma and subarachnoid hemorrhage, all on R, here for rehab. Pt had brain tumor 2 years prior. Pt was working full time prior to this admission.  Past Medical History  Diagnosis Date  . 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 Fracture, ankle 2012    right  . History of radiation therapy 09/23/10- 11/07/10    right parietal lobe  . Chronic headaches     uses oxycodone appropriately  . Anxiety   . Seizures     sec to brain surgery and tumor  . Muscle spasticity     Past Surgical History  Procedure Laterality Date  . Orif ankle fracture  08/22/2010    right  . Radiology with anesthesia N/A 05/02/2013    Procedure: RADIOLOGY WITH ANESTHESIA;  Surgeon: [name], MD;  Location: [facility] OR;  Service: Radiology;  Laterality: N/A;  . Brain surgery Right 2012    astrocytoma R parietal lobe      Medication List       This list is accurate as of: 05/20/13 11:59 PM.  Always use your most recent med list.               aspirin 325 MG EC tablet  Take 325 mg by mouth daily.     clonazePAM 0.5 MG tablet  Commonly known as:  KLONOPIN  Take 0.5 mg by mouth every 6 (six) hours as needed for anxiety. For anxiety     clopidogrel 75 MG tablet  Commonly known as:  PLAVIX  Take 75 mg by mouth daily with breakfast.     dexamethasone 2 MG tablet  Commonly known  as:  DECADRON  Take 2 mg by mouth every 12 (twelve) hours.     lacosamide 50 MG Tabs tablet  Commonly known as:  VIMPAT  Take 50 mg by mouth 2 (two) times daily.     levETIRAcetam 500 MG tablet  Commonly known as:  KEPPRA  Take 1 tablet (500 mg total) by mouth every 12 (twelve) hours.     methylphenidate 5 MG tablet  Commonly known as:  RITALIN  Take 5 mg by mouth 2 (two) times daily. At 7a and noon     multivitamin tablet  Take 1 tablet by mouth daily.     PARoxetine 37.5 MG 24 hr tablet  Commonly known as:  PAXIL-CR  Take 37.5 mg by mouth daily.        Meds ordered this encounter  Medications  . clopidogrel (PLAVIX) 75 MG tablet    Sig: Take 75 mg by mouth daily with breakfast.  . aspirin 325 MG EC tablet    Sig: Take 325 mg by mouth daily.  Marland Kitchen dexamethasone (DECADRON) 2 MG tablet    Sig: Take 2 mg by mouth every 12 (twelve) hours.  Marland Kitchen lacosamide (VIMPAT) 50 MG TABS tablet    Sig: Take  50 mg by mouth 2 (two) times daily.  . methylphenidate (RITALIN) 5 MG tablet    Sig: Take 5 mg by mouth 2 (two) times daily. At 7a and noon  . PARoxetine (PAXIL-CR) 37.5 MG 24 hr tablet    Sig: Take 37.5 mg by mouth daily.  . clonazePAM (KLONOPIN) 0.5 MG tablet    Sig: Take 0.5 mg by mouth every 6 (six) hours as needed for anxiety. For anxiety    Immunization History  Administered Date(s) Administered  . Tdap 08/30/2011    History  Substance Use Topics  . Smoking status: Never Smoker   . Smokeless tobacco: Never Used  . Alcohol Use: 0.0 oz/week    3-4 Cans of beer per week    Family history is noncontributory    Review of Systems - pt did not speak to me    Filed Vitals:   05/22/13 1212  BP: 118/74  Pulse: 82  Temp: 97.9 F (36.6 C)  Resp: 16    Physical Exam  GENERAL APPEARANCE: somnalent, nonconversant. Appropriately groomed. No acute distress.  SKIN: No diaphoresis rash,  HEAD: Normocephalic, atraumatic  EYES: Conjunctiva/lids clear. Pupils round, reactive.  EOMs intact.  EARS: External exam WNL, canals clear. Hearing grossly normal.  NOSE: No deformity or discharge.  MOUTH/THROAT: Lips w/o lesions.  RESPIRATORY: Breathing is even, unlabored. Lung sounds are clear   CARDIOVASCULAR: Heart RRR no murmurs, rubs or gallops. No peripheral edema.  GASTROINTESTINAL: Abdomen is soft, non-tender, not distended w/ normal bowel sounds GENITOURINARY: Bladder non tender, not distended  MUSCULOSKELETAL: No abnormal joints or musculature NEUROLOGIC: . Cranial nerves 2-10 grossly intact;pt did not speak PSYCHIATRIC: somnalent no behavioral issues  Patient Active Problem List   Diagnosis Date Noted  . Anxiety   . Seizures   . Muscle spasticity   . Subarachnoid hemorrhage 05/05/2013  . SAH (subarachnoid hemorrhage) 04/21/2013  . Cerebral aneurysm, nonruptured 04/21/2013  . Protein-calorie malnutrition, severe 04/19/2013  . History of radiation therapy   . 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    CBC    Component Value Date/Time   WBC 5.9 05/20/2013 0530   WBC 3.3* 12/07/2012 1552   RBC 3.12* 05/20/2013 0530   RBC 3.72 12/07/2012 1552   HGB 9.7* 05/20/2013 0530   HGB 11.9 12/07/2012 1552   HCT 28.7* 05/20/2013 0530   HCT 34.6* 12/07/2012 1552   PLT 177 05/20/2013 0530   PLT 97* 12/07/2012 1552   MCV 92.0 05/20/2013 0530   MCV 92.9 12/07/2012 1552   LYMPHSABS 1.1 05/17/2013 0540   LYMPHSABS 0.7* 12/07/2012 1552   MONOABS 0.2 05/17/2013 0540   MONOABS 0.3 12/07/2012 1552   EOSABS 0.1 05/17/2013 0540   EOSABS 0.1 12/07/2012 1552   BASOSABS 0.0 05/17/2013 0540   BASOSABS 0.0 12/07/2012 1552    CMP     Component Value Date/Time   NA 142 05/20/2013 0530   NA 144 12/07/2012 1552   K 4.1 05/20/2013 0530   K 4.0 12/07/2012 1552   CL 109 05/20/2013 0530   CL 105 08/31/2012 1523   CO2 23 05/20/2013 0530   CO2 31* 12/07/2012 1552   GLUCOSE 107* 05/20/2013 0530   GLUCOSE 112 12/07/2012 1552   GLUCOSE 97 08/31/2012 1523   BUN 27* 05/20/2013 0530   BUN 12.2 12/07/2012 1552    CREATININE 1.12* 05/20/2013 0530   CREATININE 1.0 12/07/2012 1552   CALCIUM 9.3 05/20/2013 0530   CALCIUM 10.4 12/07/2012 1552   PROT 5.2* 05/14/2013 1040   PROT  6.5 12/07/2012 1552   ALBUMIN 2.6* 05/14/2013 1040   ALBUMIN 3.2* 12/07/2012 1552   AST 41* 05/14/2013 1040   AST 56* 12/07/2012 1552   ALT 35 05/14/2013 1040   ALT 27 12/07/2012 1552   ALKPHOS 108 05/14/2013 1040   ALKPHOS 127 12/07/2012 1552   BILITOT 0.7 05/14/2013 1040   BILITOT 1.08 12/07/2012 1552   GFRNONAA 55* 05/20/2013 0530   GFRAA 63* 05/20/2013 0530    Assessment and Plan  Subarachnoid hemorrhage R temporal, frontal and parietal felt 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 Dec 1; R MCA infarct with acute on chronic R subdural hematoma; Pt placed on Ritalin to help with ability to be aroused for therapy and able to focus on tasks; Pt on ASA and Plavix for prophylaxis DVT  Seizures Focal motor seizure L arm -continue keppra and Vimpat  Muscle spasticity Pt was on baclofen but not on it any longer  Anxiety Will continue paxil and ativan  Protein-calorie malnutrition, severe Will have nutrition assess for supplements to diet  Astrocytoma brain tumor 2012, S/p resection; s/p radiation;recent CT angiogram showed chronic cystic cavity of R parietal lobe    Margit Hanks, MD

## 2013-05-22 NOTE — Assessment & Plan Note (Signed)
Focal motor seizure L arm -continue keppra and Vimpat

## 2013-05-23 ENCOUNTER — Other Ambulatory Visit: Payer: Self-pay | Admitting: *Deleted

## 2013-05-23 MED ORDER — METHYLPHENIDATE HCL 5 MG PO TABS: ORAL_TABLET | ORAL | Status: DC

## 2013-05-24 ENCOUNTER — Encounter (HOSPITAL_COMMUNITY): Payer: Self-pay | Admitting: Emergency Medicine

## 2013-05-24 ENCOUNTER — Inpatient Hospital Stay (HOSPITAL_COMMUNITY)
Admission: EM | Admit: 2013-05-24 | Discharge: 2013-05-30 | DRG: 071 | Disposition: A | Discharge status: Skilled Nursing Facility | Payer: PRIVATE HEALTH INSURANCE | Attending: Internal Medicine | Admitting: Internal Medicine

## 2013-05-24 ENCOUNTER — Emergency Department (HOSPITAL_COMMUNITY): Payer: PRIVATE HEALTH INSURANCE

## 2013-05-24 DIAGNOSIS — R7989 Other specified abnormal findings of blood chemistry: Secondary | ICD-10-CM | POA: Diagnosis present

## 2013-05-24 DIAGNOSIS — C713 Malignant neoplasm of parietal lobe: Secondary | ICD-10-CM | POA: Diagnosis present

## 2013-05-24 DIAGNOSIS — R748 Abnormal levels of other serum enzymes: Secondary | ICD-10-CM | POA: Diagnosis present

## 2013-05-24 DIAGNOSIS — F411 Generalized anxiety disorder: Secondary | ICD-10-CM | POA: Diagnosis present

## 2013-05-24 DIAGNOSIS — C719 Malignant neoplasm of brain, unspecified: Secondary | ICD-10-CM | POA: Diagnosis present

## 2013-05-24 DIAGNOSIS — R5381 Other malaise: Secondary | ICD-10-CM | POA: Diagnosis present

## 2013-05-24 DIAGNOSIS — F329 Major depressive disorder, single episode, unspecified: Secondary | ICD-10-CM

## 2013-05-24 DIAGNOSIS — Z87820 Personal history of traumatic brain injury: Secondary | ICD-10-CM

## 2013-05-24 DIAGNOSIS — Z7982 Long term (current) use of aspirin: Secondary | ICD-10-CM

## 2013-05-24 DIAGNOSIS — E860 Dehydration: Secondary | ICD-10-CM | POA: Diagnosis present

## 2013-05-24 DIAGNOSIS — I609 Nontraumatic subarachnoid hemorrhage, unspecified: Secondary | ICD-10-CM | POA: Diagnosis present

## 2013-05-24 DIAGNOSIS — G9340 Encephalopathy, unspecified: Principal | ICD-10-CM | POA: Diagnosis present

## 2013-05-24 DIAGNOSIS — R569 Unspecified convulsions: Secondary | ICD-10-CM | POA: Diagnosis present

## 2013-05-24 DIAGNOSIS — M62838 Other muscle spasm: Secondary | ICD-10-CM | POA: Diagnosis present

## 2013-05-24 DIAGNOSIS — Z803 Family history of malignant neoplasm of breast: Secondary | ICD-10-CM

## 2013-05-24 DIAGNOSIS — R3989 Other symptoms and signs involving the genitourinary system: Secondary | ICD-10-CM | POA: Diagnosis present

## 2013-05-24 DIAGNOSIS — R4182 Altered mental status, unspecified: Secondary | ICD-10-CM | POA: Diagnosis present

## 2013-05-24 DIAGNOSIS — F3289 Other specified depressive episodes: Secondary | ICD-10-CM | POA: Diagnosis present

## 2013-05-24 DIAGNOSIS — F419 Anxiety disorder, unspecified: Secondary | ICD-10-CM

## 2013-05-24 DIAGNOSIS — Z923 Personal history of irradiation: Secondary | ICD-10-CM

## 2013-05-24 DIAGNOSIS — IMO0002 Reserved for concepts with insufficient information to code with codable children: Secondary | ICD-10-CM

## 2013-05-24 DIAGNOSIS — I69959 Hemiplegia and hemiparesis following unspecified cerebrovascular disease affecting unspecified side: Secondary | ICD-10-CM

## 2013-05-24 DIAGNOSIS — I671 Cerebral aneurysm, nonruptured: Secondary | ICD-10-CM | POA: Diagnosis present

## 2013-05-24 LAB — URINALYSIS, ROUTINE W REFLEX MICROSCOPIC
Glucose, UA: NEGATIVE mg/dL
Leukocytes, UA: NEGATIVE
Nitrite: NEGATIVE
Protein, ur: NEGATIVE mg/dL
Specific Gravity, Urine: 1.016 (ref 1.005–1.030)
pH: 6.5 (ref 5.0–8.0)

## 2013-05-24 LAB — COMPREHENSIVE METABOLIC PANEL
ALT: 50 U/L — ABNORMAL HIGH (ref 0–35)
AST: 36 U/L (ref 0–37)
Albumin: 3.2 g/dL — ABNORMAL LOW (ref 3.5–5.2)
CO2: 22 mEq/L (ref 19–32)
Calcium: 10.5 mg/dL (ref 8.4–10.5)
Creatinine, Ser: 1.23 mg/dL — ABNORMAL HIGH (ref 0.50–1.10)
Glucose, Bld: 94 mg/dL (ref 70–99)
Potassium: 3.7 mEq/L (ref 3.5–5.1)
Sodium: 142 mEq/L (ref 135–145)

## 2013-05-24 LAB — CBC WITH DIFFERENTIAL/PLATELET
Basophils Absolute: 0 10*3/uL (ref 0.0–0.1)
Basophils Relative: 1 % (ref 0–1)
Eosinophils Relative: 1 % (ref 0–5)
HCT: 31.5 % — ABNORMAL LOW (ref 36.0–46.0)
MCHC: 33.3 g/dL (ref 30.0–36.0)
MCV: 92.1 fL (ref 78.0–100.0)
Monocytes Absolute: 0.3 10*3/uL (ref 0.1–1.0)
Neutro Abs: 2.1 10*3/uL (ref 1.7–7.7)
Platelets: 179 10*3/uL (ref 150–400)
RDW: 14.8 % (ref 11.5–15.5)
WBC: 4.3 10*3/uL (ref 4.0–10.5)

## 2013-05-24 LAB — AMMONIA: Ammonia: 63 umol/L — ABNORMAL HIGH (ref 11–60)

## 2013-05-24 NOTE — ED Notes (Signed)
Bed: ZO10 Expected date:  Expected time:  Means of arrival:  Comments: Hold for RES A

## 2013-05-24 NOTE — ED Notes (Signed)
Per EMS: Pt from [facility].  Pt was admitted to [facility] last Friday.  Staff does not know why she came to [facility].  Upon her admission there, pt was alert/oriented and talking.  Today pt has mostly slept all day.  Staff was waiting for doctor to come see patient, however doctor did not come so they called 911.  Pt is responsive to pain.  Pt is not unresponsive but more uncooperative.

## 2013-05-24 NOTE — ED Provider Notes (Signed)
CSN: [id number]     Arrival date & time 05/24/13  1831 History   First MD Initiated Contact with Patient 05/24/13 1922     Chief Complaint  Patient presents with  . Altered Mental Status   (Consider location/radiation/quality/duration/timing/severity/associated sxs/prior Treatment) Patient is a 52 y.o. female presenting with altered mental status. The history is provided by the nursing home. The history is limited by the absence of a caregiver and the condition of the patient. No language interpreter was used.  Altered Mental Status Presenting symptoms: partial responsiveness   Severity:  Severe Most recent episode:  Today Episode history:  Single Timing:  Constant Progression:  Waxing and waning Chronicity:  New Context: nursing home resident     Past Medical History  Diagnosis Date  . 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 Fracture, ankle 2012    right  . History of radiation therapy 09/23/10- 11/07/10    right parietal lobe  . Chronic headaches     uses oxycodone appropriately  . Anxiety   . Muscle spasticity   . Seizures    Past Surgical History  Procedure Laterality Date  . Orif ankle fracture  08/22/2010    right  . Radiology with anesthesia N/A 05/02/2013    Procedure: RADIOLOGY WITH ANESTHESIA;  Surgeon: [name], MD;  Location: [facility] OR;  Service: Radiology;  Laterality: N/A;  . Brain surgery Right 2012    astrocytoma R parietal lobe   Family History  Problem Relation Age of Onset  . Breast cancer Mother    History  Substance Use Topics  . Smoking status: Never Smoker   . Smokeless tobacco: Never Used  . Alcohol Use: 0.0 oz/week    3-4 Cans of beer per week   OB History   Grav Para Term Preterm Abortions TAB SAB Ect Mult Living                 Review of Systems  Unable to perform ROS: Mental status change    Allergies  Vasotec  Home Medications   Current Outpatient Rx  Name  Route  Sig  Dispense  Refill  . aspirin  325 MG EC tablet   Oral   Take 325 mg by mouth daily.         . clopidogrel (PLAVIX) 75 MG tablet   Oral   Take 75 mg by mouth daily with breakfast.         . dexamethasone (DECADRON) 2 MG tablet   Oral   Take 2 mg by mouth every 12 (twelve) hours.         Marland Kitchen lacosamide (VIMPAT) 50 MG TABS tablet   Oral   Take 50 mg by mouth 2 (two) times daily.         Marland Kitchen levETIRAcetam (KEPPRA) 500 MG tablet   Oral   Take 1 tablet (500 mg total) by mouth every 12 (twelve) hours.   60 tablet   5   . methylphenidate (RITALIN) 5 MG tablet      Take one tablet by mouth twice daily   6 tablet   0   . Multiple Vitamin (MULTIVITAMIN) tablet   Oral   Take 1 tablet by mouth daily.           . pantoprazole (PROTONIX) 40 MG tablet   Oral   Take 40 mg by mouth daily.          BP 158/80  Pulse 65  Temp(Src)  98 F (36.7 C) (Rectal)  Resp 12  SpO2 98%  LMP 08/26/2010 Physical Exam  Constitutional: She is oriented to person, place, and time. No distress.  Chronically ill appearing. Many bruises in multiple stages  HENT:  Head: Normocephalic and atraumatic.  Mouth/Throat: No oropharyngeal exudate.  Eyes: Pupils are equal, round, and reactive to light.  Neck: Normal range of motion. Neck supple.  Cardiovascular: Normal rate, regular rhythm and normal heart sounds.  Exam reveals no gallop and no friction rub.   No murmur heard. Pulmonary/Chest: Effort normal and breath sounds normal. No respiratory distress. She has no wheezes. She has no rales.  Abdominal: Soft. Bowel sounds are normal. She exhibits no distension and no mass. There is no tenderness. There is no rebound and no guarding.  Musculoskeletal: Normal range of motion. She exhibits no edema and no tenderness.  Neurological: She is alert and oriented to person, place, and time. GCS eye subscore is 4. GCS verbal subscore is 1. GCS motor subscore is 5.  Spacticity of RUE, RLE, no voluntary mvmt of LUE, LLE  Skin: Skin is warm  and dry.  Psychiatric: She has a normal mood and affect.    ED Course  Procedures (including critical care time) Labs Review Labs Reviewed  CBC WITH DIFFERENTIAL - Abnormal; Notable for the following:    RBC 3.42 (*)    Hemoglobin 10.5 (*)    HCT 31.5 (*)    All other components within normal limits  COMPREHENSIVE METABOLIC PANEL - Abnormal; Notable for the following:    BUN 33 (*)    Creatinine, Ser 1.23 (*)    Albumin 3.2 (*)    ALT 50 (*)    Alkaline Phosphatase 125 (*)    GFR calc non Af Amer 49 (*)    GFR calc Af Amer 57 (*)    All other components within normal limits  AMMONIA - Abnormal; Notable for the following:    Ammonia 63 (*)    All other components within normal limits  LACTIC ACID, PLASMA  URINALYSIS, ROUTINE W REFLEX MICROSCOPIC   Imaging Review Dg Chest 1 View  05/24/2013   CLINICAL DATA:  Unresponsive.  EXAM: CHEST - 1 VIEW  COMPARISON:  05/18/2013.  FINDINGS: The cardiac silhouette remains borderline enlarged. Poor inspiration. Clear lungs. Mild scoliosis.  IMPRESSION: No acute abnormality.   Electronically Signed   By: Gordan Payment M.D.   On: 05/24/2013 20:48   Ct Head Wo Contrast  05/24/2013   CLINICAL DATA:  Altered mental status. History of traumatic brain injury and subarachnoid hemorrhage.  EXAM: CT HEAD WITHOUT CONTRAST  TECHNIQUE: Contiguous axial images were obtained from the base of the skull through the vertex without intravenous contrast.  COMPARISON:  04/27/2013.  FINDINGS: Compared to the prior exam, the right cerebral convexity subdural hematoma has decreased in attenuation. There is no recurrent hemorrhage. Cystic lesion in the right parietal lobe appears unchanged with hematocrit level. Ventricular size and configuration is unchanged compared to recent prior. Right parietal craniotomy is unchanged. There is no acute or a subacute hemorrhage. No subarachnoid blood is present. No midline shift.  New since the prior head CT is a right internal  carotid artery stent, presumably exclusionary for aneurysm. Right cerebral hemispheric encephalomalacia appears little changed.  IMPRESSION: 1. Expected evolution of small right subdural hematoma. 2. Unchanged cystic lesion in the right parietal lobe with hematocrit. 3. Evolving right MCA infarct. 4. Right internal carotid artery stent. 5. No acute intracranial abnormality.  Electronically Signed   By: Andreas Newport M.D.   On: 05/24/2013 20:56    EKG Interpretation   None       MDM   1. Altered mental status    Pt is a 52 y.o. female with Pmhx as above including recent admission for SAH, aneurysm coiling, AoC SDH, MCA infarct, seizures, known astrocytoma, who presents with AMS. Per facility report she was "lethargic" all day.  Nursing reports she is normally responsive, verbal, interactive.   On PE, VSS.  GCS 4, 1, 5. She has significant spasticity on R, no voluntary mvmt on right.  WBC unremarkable.  CMP w/ mild BUN, Cr elevation, ALT, ALK phos, ammonia elevation. CXR, UA, unremarkable.  CT head w/o acute changes.  Given I have no clear source of AMS, [facility] and neurology consulted for admission.         Shanna Cisco, MD 05/25/13 [phone]
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
## 2013-05-25 ENCOUNTER — Encounter (HOSPITAL_COMMUNITY): Payer: Self-pay | Admitting: Urology

## 2013-05-25 ENCOUNTER — Inpatient Hospital Stay (HOSPITAL_COMMUNITY)
Admit: 2013-05-25 | Discharge: 2013-05-25 | Disposition: A | Discharge status: Home / Self Care | Payer: PRIVATE HEALTH INSURANCE | Attending: Neurology | Admitting: Neurology

## 2013-05-25 ENCOUNTER — Inpatient Hospital Stay (HOSPITAL_COMMUNITY): Payer: PRIVATE HEALTH INSURANCE

## 2013-05-25 DIAGNOSIS — R748 Abnormal levels of other serum enzymes: Secondary | ICD-10-CM | POA: Diagnosis present

## 2013-05-25 DIAGNOSIS — R4182 Altered mental status, unspecified: Secondary | ICD-10-CM | POA: Diagnosis present

## 2013-05-25 DIAGNOSIS — R7989 Other specified abnormal findings of blood chemistry: Secondary | ICD-10-CM | POA: Diagnosis present

## 2013-05-25 DIAGNOSIS — I671 Cerebral aneurysm, nonruptured: Secondary | ICD-10-CM

## 2013-05-25 LAB — BASIC METABOLIC PANEL
BUN: 26 mg/dL — ABNORMAL HIGH (ref 6–23)
Calcium: 10.3 mg/dL (ref 8.4–10.5)
Creatinine, Ser: 1.14 mg/dL — ABNORMAL HIGH (ref 0.50–1.10)
GFR calc Af Amer: 62 mL/min — ABNORMAL LOW (ref >90)

## 2013-05-25 LAB — FOLATE RBC: RBC Folate: 1185 ng/mL — ABNORMAL HIGH (ref >280)

## 2013-05-25 LAB — TSH: TSH: 0.862 u[IU]/mL (ref 0.350–4.500)

## 2013-05-25 LAB — HEMOGLOBIN A1C
Hgb A1c MFr Bld: 4.8 % (ref <5.7)
Mean Plasma Glucose: 91 mg/dL (ref <117)

## 2013-05-25 LAB — MRSA PCR SCREENING: MRSA by PCR: NEGATIVE

## 2013-05-25 MED ORDER — SODIUM CHLORIDE 0.9 % IV SOLN
500.0000 mg | Freq: Two times a day (BID) | INTRAVENOUS | Status: DC
  Administered 2013-05-25 – 2013-05-29 (×10): 500 mg via INTRAVENOUS
  Filled 2013-05-25 (×12): qty 5

## 2013-05-25 MED ORDER — DEXTROSE-NACL 5-0.9 % IV SOLN
INTRAVENOUS | Status: DC
  Administered 2013-05-25 – 2013-05-27 (×3): via INTRAVENOUS

## 2013-05-25 MED ORDER — PNEUMOCOCCAL VAC POLYVALENT 25 MCG/0.5ML IJ INJ
0.5000 mL | INJECTION | INTRAMUSCULAR | Status: AC
  Administered 2013-05-26: 11:00:00 0.5 mL via INTRAMUSCULAR
  Filled 2013-05-25 (×2): qty 0.5

## 2013-05-25 MED ORDER — SODIUM CHLORIDE 0.9 % IV SOLN
50.0000 mg | Freq: Two times a day (BID) | INTRAVENOUS | Status: DC
  Administered 2013-05-25 – 2013-05-29 (×10): 50 mg via INTRAVENOUS
  Filled 2013-05-25 (×17): qty 5

## 2013-05-25 MED ORDER — SODIUM CHLORIDE 0.9 % IV SOLN: 2.0000 mg | Freq: Two times a day (BID) | INTRAVENOUS | Status: DC

## 2013-05-25 MED ORDER — PANTOPRAZOLE SODIUM 40 MG IV SOLR
40.0000 mg | INTRAVENOUS | Status: DC
  Administered 2013-05-25 – 2013-05-27 (×3): 40 mg via INTRAVENOUS
  Filled 2013-05-25 (×5): qty 40

## 2013-05-25 MED ORDER — INFLUENZA VAC SPLIT QUAD 0.5 ML IM SUSP
0.5000 mL | INTRAMUSCULAR | Status: AC
  Administered 2013-05-26: 11:00:00 0.5 mL via INTRAMUSCULAR
  Filled 2013-05-25 (×2): qty 0.5

## 2013-05-25 MED ORDER — DEXAMETHASONE SODIUM PHOSPHATE 4 MG/ML IJ SOLN
2.0000 mg | Freq: Two times a day (BID) | INTRAMUSCULAR | Status: DC
  Administered 2013-05-25 – 2013-05-28 (×9): 2 mg via INTRAVENOUS
  Filled 2013-05-25 (×14): qty 0.5

## 2013-05-25 MED ORDER — LORAZEPAM 2 MG/ML IJ SOLN
1.0000 mg | Freq: Once | INTRAMUSCULAR | Status: AC
  Administered 2013-05-25: 14:00:00 2 mg via INTRAVENOUS
  Filled 2013-05-25: qty 1

## 2013-05-25 MED ORDER — GADOBENATE DIMEGLUMINE 529 MG/ML IV SOLN
10.0000 mL | Freq: Once | INTRAVENOUS | Status: AC | PRN
  Administered 2013-05-25: 10 mL via INTRAVENOUS
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
## 2013-05-25 NOTE — Progress Notes (Signed)
OT Cancellation Note  Patient Details Name: [name] MRN: [id number] DOB: 1960-08-29   Cancelled Treatment:    Reason Eval/Treat Not Completed: Other (comment)  Pt not ready per nursing:  Will check back Friday  Lithzy Bernard 05/25/2013, 12:29 PM Marica Otter, OTR/L [phone] 05/25/2013

## 2013-05-25 NOTE — Evaluation (Signed)
SLP Cancellation Note  Patient Details Name: [name] MRN: [id number] DOB: May 03, 1961   Cancelled treatment:       Reason Eval/Treat Not Completed: Patient at procedure or test/unavailable (will re-attempt as schedule allows next date).    Per chart review, pt has been lethargic or not participative in care - pulling away from staff.  Per chart review, note pt is on a dysphagia diet and nectar liquids at her SNF-  *unknown which dysphagia diet*.     Donavan Burnet, MS [facility] SLP [phone]

## 2013-05-25 NOTE — Progress Notes (Signed)
PT Cancellation Note  Patient Details Name: [name] MRN: [id number] DOB: 1960/11/05   Cancelled Treatment:    Reason Eval/Treat Not Completed: Other (comment) (pt not ready per RN and OT; will see 12/26)   [facility] 05/25/2013, 12:46 PM

## 2013-05-25 NOTE — Progress Notes (Signed)
Patient seen by partner Dr. Cyril Mourning this morning. Continues to be altered, with active resistance to any examination. EEG is negative for seizures. Ammonia is borderline. TSH WNL. Afebrile, no leukocytosis.   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    MRI pending.   Ritta Slot, MD [facility] [phone]  If 7pm- 7am, please page neurology on call at [phone].
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
## 2013-05-25 NOTE — Care Management Note (Addendum)
    Page 1 of 1   05/30/2013     1:08:18 PM   CARE MANAGEMENT NOTE 05/30/2013  Patient:  [name], [name]   Account Number:  [id number]  Date Initiated:  05/25/2013  Documentation initiated by:  Lanier Clam  Subjective/Objective Assessment:   52 Y/O F ADMITTED W/AMS.ANEURYSM.READMIT-12/4-12/19-STROKE.     Action/Plan:   FROM [facility].   Anticipated DC Date:  05/30/2013   Anticipated DC Plan:  SKILLED NURSING FACILITY      DC Planning Services  CM consult      Choice offered to / List presented to:             Status of service:  Completed, signed off Medicare Important Message given?   (If response is "NO", the following Medicare IM given date fields will be blank) Date Medicare IM given:   Date Additional Medicare IM given:    Discharge Disposition:  SKILLED NURSING FACILITY  Per UR Regulation:  Reviewed for med. necessity/level of care/duration of stay  If discussed at Long Length of Stay Meetings, dates discussed:    Comments:  05/30/13 [facility] RN,BSN NCM [phone] D/C SNF.  05/25/13 Jannelle Notaro RN,BSN NCM [phone]
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
## 2013-05-25 NOTE — Procedures (Signed)
History: 52 yo F with AMS in teh setting of previous stroke and SAH  Background: There is irregular delta and theta activity. During sleep, sleep structures are seen on both sides, but are more prominent on the left.   Photic stimulation: Physiologic driving is not performed  EEG Abnormalities: 1) Generalized irregular slow activity.  2) Asymmetric sleep structures.    Clinical Interpretation: This EEG is consistent with a moderate non-specific generalized cerebral dysfunction. In addition, the asymmetric sleep structures are consistent with the patient's known history of rigth sided cerebral dysfunction. There was no seizure or seizure predisposition recorded on this study.   Ritta Slot, MD [facility] [phone]  If 7pm- 7am, please page neurology on call at [phone].

## 2013-05-25 NOTE — Progress Notes (Signed)
EEG Completed; Results Pending  

## 2013-05-26 DIAGNOSIS — C719 Malignant neoplasm of brain, unspecified: Secondary | ICD-10-CM

## 2013-05-26 LAB — LEVETIRACETAM LEVEL: Levetiracetam Lvl: 26.9 ug/mL (ref 5.0–30.0)

## 2013-05-26 LAB — CBC WITH DIFFERENTIAL/PLATELET
Basophils Absolute: 0 10*3/uL (ref 0.0–0.1)
Basophils Relative: 0 % (ref 0–1)
Eosinophils Absolute: 0 10*3/uL (ref 0.0–0.7)
Eosinophils Relative: 0 % (ref 0–5)
HCT: 32.8 % — ABNORMAL LOW (ref 36.0–46.0)
Lymphocytes Relative: 25 % (ref 12–46)
Lymphs Abs: 1.8 10*3/uL (ref 0.7–4.0)
MCHC: 34.5 g/dL (ref 30.0–36.0)
MCV: 91.4 fL (ref 78.0–100.0)
Monocytes Absolute: 0.3 10*3/uL (ref 0.1–1.0)
Neutro Abs: 4.9 10*3/uL (ref 1.7–7.7)
Platelets: 180 10*3/uL (ref 150–400)
RBC: 3.59 MIL/uL — ABNORMAL LOW (ref 3.87–5.11)
WBC: 6.9 10*3/uL (ref 4.0–10.5)

## 2013-05-26 LAB — COMPREHENSIVE METABOLIC PANEL
AST: 40 U/L — ABNORMAL HIGH (ref 0–37)
Albumin: 3.3 g/dL — ABNORMAL LOW (ref 3.5–5.2)
BUN: 25 mg/dL — ABNORMAL HIGH (ref 6–23)
CO2: 21 mEq/L (ref 19–32)
Calcium: 10.5 mg/dL (ref 8.4–10.5)
Creatinine, Ser: 1.17 mg/dL — ABNORMAL HIGH (ref 0.50–1.10)
GFR calc non Af Amer: 52 mL/min — ABNORMAL LOW (ref >90)

## 2013-05-26 LAB — MAGNESIUM: Magnesium: 1.9 mg/dL (ref 1.5–2.5)

## 2013-05-26 MED ORDER — VANCOMYCIN HCL 500 MG IV SOLR
500.0000 mg | Freq: Two times a day (BID) | INTRAVENOUS | Status: DC
  Administered 2013-05-26 – 2013-05-29 (×8): 500 mg via INTRAVENOUS
  Filled 2013-05-26 (×8): qty 500

## 2013-05-26 MED ORDER — SODIUM CHLORIDE 0.9 % IV SOLN: INTRAVENOUS | Status: DC
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
## 2013-05-26 NOTE — Progress Notes (Signed)
CRITICAL VALUE ALERT  Critical value received:  Blood culture positive for gram+ cocci in clusters (anaerobic bottle)  Date of notification:  05/26/2013   Time of notification:  0417  Critical value read back:yes  Nurse who received alert:  Renee Harder, RN   MD notified (1st page):  Merdis Delay, NP  Time of first page:  [phone]  MD notified (2nd page):  Time of second page:  Responding MD:  Merdis Delay  Time MD responded:  [phone]
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
## 2013-05-26 NOTE — Progress Notes (Signed)
ANTIBIOTIC CONSULT NOTE - INITIAL  Pharmacy Consult for Vancomycin Indication: Bl cx + gram positive cocci in clusters   Allergies  Allergen Reactions  . Vasotec Other (See Comments)    Causes angioedema    Patient Measurements: Height: 5\' 4"  (162.6 cm) Weight: 127 lb 13.9 oz (58 kg) IBW/kg (Calculated) : 54.7 Adjusted Body Weight:   Vital Signs: Pulse Rate: 125 (12/25 0030) Intake/Output from previous day: 12/24 0701 - 12/25 0700 In: 1728.3 [I.V.:1458.3; IV Piggyback:270] Out: -  Intake/Output from this shift:    Labs:  Recent Labs  05/24/13 2010 05/25/13 1510  WBC 4.3  --   HGB 10.5*  --   PLT 179  --   CREATININE 1.23* 1.14*   Estimated Creatinine Clearance: 48.7 ml/min (by C-G formula based on Cr of 1.14). No results found for this basename: VANCOTROUGH, Leodis Binet, VANCORANDOM, GENTTROUGH, GENTPEAK, GENTRANDOM, TOBRATROUGH, TOBRAPEAK, TOBRARND, AMIKACINPEAK, AMIKACINTROU, AMIKACIN,  in the last 72 hours   Microbiology: Recent Results (from the past 720 hour(s))  URINE CULTURE     Status: None   Collection Time    05/14/13  4:04 PM      Result Value Range Status   Specimen Description URINE, CATHETERIZED   Final   Special Requests NONE   Final   Culture  Setup Time     Final   Value: 05/14/2013 21:13     Performed at [facility] Count     Final   Value: NO GROWTH     Performed at [facility]   Culture     Final   Value: NO GROWTH     Performed at [facility]   Report Status 05/15/2013 FINAL   Final  CULTURE, BLOOD (ROUTINE X 2)     Status: None   Collection Time    05/25/13  3:05 AM      Result Value Range Status   Specimen Description BLOOD LEFT ARM   Final   Special Requests BOTTLES DRAWN AEROBIC AND ANAEROBIC 2CC   Final   Culture  Setup Time     Final   Value: 05/25/2013 08:48     Performed at [facility]   Culture     Final   Value: GRAM POSITIVE COCCI IN CLUSTERS     1610 Note: Gram Stain  Report Called to,Read Back By and Verified With: Renee Harder 05/26/13 FULKC     Performed at [facility]   Report Status PENDING   Incomplete  MRSA PCR SCREENING     Status: None   Collection Time    05/25/13  6:27 AM      Result Value Range Status   MRSA by PCR NEGATIVE  NEGATIVE Final   Comment:            The GeneXpert MRSA Assay (FDA     approved for NASAL specimens     only), is one component of a     comprehensive MRSA colonization     surveillance program. It is not     intended to diagnose MRSA     infection nor to guide or     monitor treatment for     MRSA infections.    Medical History: Past Medical History  Diagnosis Date  . 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 Fracture, ankle 2012    right  . History of radiation therapy 09/23/10- 11/07/10    right parietal lobe  .  Chronic headaches     uses oxycodone appropriately  . Anxiety   . Muscle spasticity   . Seizures     Medications:  Anti-infectives   Start     Dose/Rate Route Frequency Ordered Stop   05/26/13 0600  vancomycin (VANCOCIN) 500 mg in sodium chloride 0.9 % 100 mL IVPB     500 mg 100 mL/hr over 60 Minutes Intravenous Every 12 hours 05/26/13 0452       Assessment: Patient with Bl cx + gram positive cocci in clusters.    Goal of Therapy:  Vancomycin trough level 15-20 mcg/ml  Plan:  Measure antibiotic drug levels at steady state Follow up culture results Vancomycin 500mg  iv q12hr  Aleene Davidson Crowford 05/26/2013,4:53 AM

## 2013-05-27 DIAGNOSIS — F329 Major depressive disorder, single episode, unspecified: Secondary | ICD-10-CM

## 2013-05-27 DIAGNOSIS — R4182 Altered mental status, unspecified: Secondary | ICD-10-CM

## 2013-05-27 LAB — COMPREHENSIVE METABOLIC PANEL
ALT: 34 U/L (ref 0–35)
Albumin: 3 g/dL — ABNORMAL LOW (ref 3.5–5.2)
Calcium: 10.2 mg/dL (ref 8.4–10.5)
GFR calc Af Amer: 59 mL/min — ABNORMAL LOW (ref >90)
GFR calc non Af Amer: 51 mL/min — ABNORMAL LOW (ref >90)
Glucose, Bld: 137 mg/dL — ABNORMAL HIGH (ref 70–99)
Potassium: 3.8 mEq/L (ref 3.5–5.1)
Sodium: 144 mEq/L (ref 135–145)
Total Protein: 5.7 g/dL — ABNORMAL LOW (ref 6.0–8.3)

## 2013-05-27 LAB — CBC WITH DIFFERENTIAL/PLATELET
Hemoglobin: 9.5 g/dL — ABNORMAL LOW (ref 12.0–15.0)
Lymphocytes Relative: 25 % (ref 12–46)
Lymphs Abs: 1.1 10*3/uL (ref 0.7–4.0)
MCH: 31.4 pg (ref 26.0–34.0)
Monocytes Relative: 4 % (ref 3–12)
Neutro Abs: 3.3 10*3/uL (ref 1.7–7.7)
Neutrophils Relative %: 71 % (ref 43–77)
Platelets: 172 10*3/uL (ref 150–400)
RBC: 3.03 MIL/uL — ABNORMAL LOW (ref 3.87–5.11)
WBC: 4.6 10*3/uL (ref 4.0–10.5)

## 2013-05-27 LAB — MAGNESIUM: Magnesium: 1.7 mg/dL (ref 1.5–2.5)

## 2013-05-27 LAB — CULTURE, BLOOD (ROUTINE X 2)

## 2013-05-27 MED ORDER — PANTOPRAZOLE SODIUM 40 MG IV SOLR
40.0000 mg | INTRAVENOUS | Status: DC
  Administered 2013-05-28 – 2013-05-29 (×2): 40 mg via INTRAVENOUS
  Filled 2013-05-27 (×3): qty 40

## 2013-05-27 MED ORDER — ESCITALOPRAM OXALATE 5 MG PO TABS
5.0000 mg | ORAL_TABLET | Freq: Every day | ORAL | Status: DC
  Administered 2013-05-27 – 2013-05-28 (×2): 5 mg via ORAL
  Filled 2013-05-27 (×2): qty 1

## 2013-05-27 MED ORDER — STARCH (THICKENING) PO POWD
ORAL | Status: DC | PRN
  Filled 2013-05-27: qty 227

## 2013-05-27 NOTE — Consult Note (Signed)
[facility] Face-to-Face Psychiatry Consult   Reason for Consult:  Agitation, psychosis and depression Referring Physician:  Dr. Janyth [name] is an 52 y.o. female.  Assessment: AXIS I:  Depressive Disorder NOS and Psychotic Disorder NOS AXIS II:  Deferred AXIS III:   Past Medical History  Diagnosis Date  . 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 Fracture, ankle 2012    right  . History of radiation therapy 09/23/10- 11/07/10    right parietal lobe  . Chronic headaches     uses oxycodone appropriately  . Anxiety   . Muscle spasticity   . Seizures    AXIS IV:  other psychosocial or environmental problems, problems related to social environment and problems with primary support group AXIS V:  31-40 impairment in reality testing  Plan:  No evidence of imminent risk to self or others at present.   Patient does not meet criteria for psychiatric inpatient admission. Supportive therapy provided about ongoing stressors. Medication management  Subjective:   [name] is a 52 y.o. female patient admitted with change in mental status.  HPI:  Patient seen chart reviewed.  Patient was 52 year old Caucasian female who was admitted on the medical floor because of change in her mental status.  Consult was called because patient was experiencing confusion, hallucination and having spells of disorientation.  Overall her mental status is improved from the past .  Her father and her best friend was in the room who are very concerned about patient's behavior .  Patient endorsed that there is a person in the room who is hearing Christmas music and noises which are coming from the television.  She denies that she is experiencing a hallucination however she appears very labile tearful and could not concentrate on conversation.  She admitted sadness and depression because she missed her husband and her son.  Apparently her husband did not come to visit her yesterday on Christmas .  Her  best friend endorsed that patient has been depressed for quite some time because of marital conflict .  As per him husband does not care about the patient .  Patient has multiple and complicated health issues.  She had a cerebral aneurysm and astrocytoma .  She is a poor historian and it is difficult to get a complete history however patient knows that she has history of anxiety and depression and she was given Ritalin in the past by a psychiatrist.  Patient cannot remember psychiatrist name admitted that she has not taken the medication in a while.  Initially she mentioned that it was an antidepressant but later she corrected that it was a  Ritalin. When I asked why she is not taking patient replied that she is not sad or depressed .  Patient appears very depressed and tearful.  She was unable to complete the conversation when she was about her family members .  She requested to stop the conversation because of excessive crying.  Her father and best friend endorsed that patient has been very depressed, having hallucination and talking to imaginary friend.  Father is concerned because patient has spells of confusion , crying and hallucinations.  Patient denies any previous history of suicidal attempt or any inpatient psychiatric treatment.  Patient denies any suicidal thoughts, homicidal thoughts .  Her friend endorsed that she has been sad and depressed for more than a year .  She is on 2 antiseizure medicine .  She is willing to try  antidepressant.  HPI Elements:   Location:  Medical floor. Quality:  Fair. Severity:  Mild to moderate.  Past Psychiatric History: Past Medical History  Diagnosis Date  . 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 Fracture, ankle 2012    right  . History of radiation therapy 09/23/10- 11/07/10    right parietal lobe  . Chronic headaches     uses oxycodone appropriately  . Anxiety   . Muscle spasticity   . Seizures     reports that she has never  smoked. She has never used smokeless tobacco. She reports that she drinks alcohol. She reports that she does not use illicit drugs. Family History  Problem Relation Age of Onset  . Breast cancer Mother      Living Arrangements:  (nursing home)   [facility]use[facility] Physical Abuse: Denies Verbal Abuse: Denies Sexual Abuse: Denies Allergies:   Allergies  Allergen Reactions  . Vasotec Other (See Comments)    Causes angioedema    ACT Assessment Complete:  No:   Past Psychiatric History: Patient is unable to provide the details.  However admit to see psychiatrists in the past for depression and prescribed Ritalin.  She denied a history of suicidal attempts or any inpatient psychiatric treatment. Place of Residence:  With her husband Marital Status:  Married Family Supports:  Father Objective: Blood pressure 146/75, pulse 93, temperature 98.2 F (36.8 C), temperature source Oral, resp. rate 18, height 5\' 4"  (1.626 m), weight 127 lb 13.9 oz (58 kg), last menstrual period 08/26/2010, SpO2 100.00%.Body mass index is 21.94 kg/(m^2). Results for orders placed during the hospital encounter of 05/24/13 (from the past 72 hour(s))  CBC WITH DIFFERENTIAL     Status: Abnormal   Collection Time    05/24/13  8:10 PM      Result Value Range   WBC 4.3  4.0 - 10.5 K/uL   RBC 3.42 (*) 3.87 - 5.11 MIL/uL   Hemoglobin 10.5 (*) 12.0 - 15.0 g/dL   HCT 16.1 (*) 09.6 - 04.5 %   MCV 92.1  78.0 - 100.0 fL   MCH 30.7  26.0 - 34.0 pg   MCHC 33.3  30.0 - 36.0 g/dL   RDW 40.9  81.1 - 91.4 %   Platelets 179  150 - 400 K/uL   Neutrophils Relative % 48  43 - 77 %   Neutro Abs 2.1  1.7 - 7.7 K/uL   Lymphocytes Relative 43  12 - 46 %   Lymphs Abs 1.8  0.7 - 4.0 K/uL   Monocytes Relative 7  3 - 12 %   Monocytes Absolute 0.3  0.1 - 1.0 K/uL   Eosinophils Relative 1  0 - 5 %   Eosinophils Absolute 0.1  0.0 - 0.7 K/uL   Basophils Relative 1  0 - 1 %   Basophils Absolute 0.0  0.0 - 0.1 K/uL  COMPREHENSIVE  METABOLIC PANEL     Status: Abnormal   Collection Time    05/24/13  8:10 PM      Result Value Range   Sodium 142  135 - 145 mEq/L   Potassium 3.7  3.5 - 5.1 mEq/L   Chloride 109  96 - 112 mEq/L   CO2 22  19 - 32 mEq/L   Glucose, Bld 94  70 - 99 mg/dL   BUN 33 (*) 6 - 23 mg/dL   Creatinine, Ser 7.82 (*) 0.50 - 1.10 mg/dL   Calcium 10.5  8.4 - 10.5 mg/dL   Total Protein 6.4  6.0 - 8.3 g/dL   Albumin 3.2 (*) 3.5 - 5.2 g/dL   AST 36  0 - 37 U/L   ALT 50 (*) 0 - 35 U/L   Alkaline Phosphatase 125 (*) 39 - 117 U/L   Total Bilirubin 0.7  0.3 - 1.2 mg/dL   GFR calc non Af Amer 49 (*) >90 mL/min   GFR calc Af Amer 57 (*) >90 mL/min   Comment: (NOTE)     The eGFR has been calculated using the CKD EPI equation.     This calculation has not been validated in all clinical situations.     eGFR's persistently <90 mL/min signify possible Chronic Kidney     Disease.  LACTIC ACID, PLASMA     Status: None   Collection Time    05/24/13  8:10 PM      Result Value Range   Lactic Acid, Venous 1.8  0.5 - 2.2 mmol/L  AMMONIA     Status: Abnormal   Collection Time    05/24/13  8:10 PM      Result Value Range   Ammonia 63 (*) 11 - 60 umol/L  URINALYSIS, ROUTINE W REFLEX MICROSCOPIC     Status: None   Collection Time    05/24/13  8:12 PM      Result Value Range   Color, Urine YELLOW  YELLOW   APPearance CLEAR  CLEAR   Specific Gravity, Urine 1.016  1.005 - 1.030   pH 6.5  5.0 - 8.0   Glucose, UA NEGATIVE  NEGATIVE mg/dL   Hgb urine dipstick NEGATIVE  NEGATIVE   Bilirubin Urine NEGATIVE  NEGATIVE   Ketones, ur NEGATIVE  NEGATIVE mg/dL   Protein, ur NEGATIVE  NEGATIVE mg/dL   Urobilinogen, UA 1.0  0.0 - 1.0 mg/dL   Nitrite NEGATIVE  NEGATIVE   Leukocytes, UA NEGATIVE  NEGATIVE   Comment: MICROSCOPIC NOT DONE ON URINES WITH NEGATIVE PROTEIN, BLOOD, LEUKOCYTES, NITRITE, OR GLUCOSE <1000 mg/dL.  CULTURE, BLOOD (ROUTINE X 2)     Status: None   Collection Time    05/25/13  3:05 AM      Result  Value Range   Specimen Description BLOOD LEFT ARM     Special Requests BOTTLES DRAWN AEROBIC AND ANAEROBIC 2CC     Culture  Setup Time       Value: 05/25/2013 08:48     Performed at [facility]   Culture       Value: STAPHYLOCOCCUS SPECIES (COAGULASE NEGATIVE)     Note: THE SIGNIFICANCE OF ISOLATING THIS ORGANISM FROM A SINGLE SET OF BLOOD CULTURES WHEN MULTIPLE SETS ARE DRAWN IS UNCERTAIN. PLEASE NOTIFY THE MICROBIOLOGY DEPARTMENT WITHIN ONE WEEK IF SPECIATION AND SENSITIVITIES ARE REQUIRED.     0417 Note: Gram Stain Report Called to,Read Back By and Verified With: Renee Harder 05/26/13 FULKC     Performed at [facility]   Report Status 05/27/2013 FINAL    CULTURE, BLOOD (ROUTINE X 2)     Status: None   Collection Time    05/25/13  3:20 AM      Result Value Range   Specimen Description BLOOD RIGHT ARM     Special Requests BOTTLES DRAWN AEROBIC AND ANAEROBIC 5CC     Culture  Setup Time       Value: 05/25/2013 08:48     Performed at [facility]  Value:        BLOOD CULTURE RECEIVED NO GROWTH TO DATE CULTURE WILL BE HELD FOR 5 DAYS BEFORE ISSUING A FINAL NEGATIVE REPORT     Performed at [facility]   Report Status PENDING    TSH     Status: None   Collection Time    05/25/13  3:20 AM      Result Value Range   TSH 0.862  0.350 - 4.500 uIU/mL   Comment: Performed at [facility]  PRO B NATRIURETIC PEPTIDE     Status: Abnormal   Collection Time    05/25/13  3:20 AM      Result Value Range   Pro B Natriuretic peptide (BNP) 212.1 (*) 0 - 125 pg/mL  HEMOGLOBIN A1C     Status: None   Collection Time    05/25/13  3:20 AM      Result Value Range   Hemoglobin A1C 4.8  <5.7 %   Comment: (NOTE)                                                                               According to the ADA Clinical Practice Recommendations for 2011, when     HbA1c is used as a screening test:      >=6.5%   Diagnostic of Diabetes Mellitus                (if abnormal result is confirmed)     5.7-6.4%   Increased risk of developing Diabetes Mellitus     References:Diagnosis and Classification of Diabetes Mellitus,Diabetes     Care,2011,34(Suppl 1):S62-S69 and Standards of Medical Care in             Diabetes - 2011,Diabetes Care,2011,34 (Suppl 1):S11-S61.   Mean Plasma Glucose 91  <117 mg/dL   Comment: Performed at [facility]  FOLATE RBC     Status: Abnormal   Collection Time    05/25/13  3:20 AM      Result Value Range   RBC Folate 1185 (*) >280 ng/mL   Comment: Reference range not established for pediatric patients.     Performed at [facility]  MRSA PCR SCREENING     Status: None   Collection Time    05/25/13  6:27 AM      Result Value Range   MRSA by PCR NEGATIVE  NEGATIVE   Comment:            The GeneXpert MRSA Assay (FDA     approved for NASAL specimens     only), is one component of a     comprehensive MRSA colonization     surveillance program. It is not     intended to diagnose MRSA     infection nor to guide or     monitor treatment for     MRSA infections.  BASIC METABOLIC PANEL     Status: Abnormal   Collection Time    05/25/13  3:10 PM      Result Value Range   Sodium 146 (*) 135 - 145 mEq/L   Comment: REPEATED TO VERIFY   Potassium 4.6  3.5 - 5.1 mEq/L  Comment: REPEATED TO VERIFY     DELTA CHECK NOTED   Chloride 112  96 - 112 mEq/L   Comment: REPEATED TO VERIFY   CO2 23  19 - 32 mEq/L   Glucose, Bld 110 (*) 70 - 99 mg/dL   BUN 26 (*) 6 - 23 mg/dL   Creatinine, Ser 1.61 (*) 0.50 - 1.10 mg/dL   Calcium 09.6  8.4 - 04.5 mg/dL   GFR calc non Af Amer 54 (*) >90 mL/min   GFR calc Af Amer 62 (*) >90 mL/min   Comment: (NOTE)     The eGFR has been calculated using the CKD EPI equation.     This calculation has not been validated in all clinical situations.     eGFR's persistently <90 mL/min signify possible Chronic Kidney     Disease.  LEVETIRACETAM LEVEL     Status: None    Collection Time    05/25/13  7:31 PM      Result Value Range   Levetiracetam Lvl 26.9  5.0 - 30.0 ug/mL   Comment: Performed at [facility]  COMPREHENSIVE METABOLIC PANEL     Status: Abnormal   Collection Time    05/26/13  5:40 AM      Result Value Range   Sodium 144  135 - 145 mEq/L   Potassium 4.7  3.5 - 5.1 mEq/L   Chloride 111  96 - 112 mEq/L   CO2 21  19 - 32 mEq/L   Glucose, Bld 110 (*) 70 - 99 mg/dL   BUN 25 (*) 6 - 23 mg/dL   Creatinine, Ser 4.09 (*) 0.50 - 1.10 mg/dL   Calcium 81.1  8.4 - 91.4 mg/dL   Total Protein 6.6  6.0 - 8.3 g/dL   Albumin 3.3 (*) 3.5 - 5.2 g/dL   AST 40 (*) 0 - 37 U/L   Comment: HEMOLYSIS AT THIS LEVEL MAY AFFECT RESULT   ALT 44 (*) 0 - 35 U/L   Alkaline Phosphatase 117  39 - 117 U/L   Total Bilirubin 0.8  0.3 - 1.2 mg/dL   GFR calc non Af Amer 52 (*) >90 mL/min   GFR calc Af Amer 60 (*) >90 mL/min   Comment: (NOTE)     The eGFR has been calculated using the CKD EPI equation.     This calculation has not been validated in all clinical situations.     eGFR's persistently <90 mL/min signify possible Chronic Kidney     Disease.  MAGNESIUM     Status: None   Collection Time    05/26/13  5:40 AM      Result Value Range   Magnesium 1.9  1.5 - 2.5 mg/dL  CBC WITH DIFFERENTIAL     Status: Abnormal   Collection Time    05/26/13  5:40 AM      Result Value Range   WBC 6.9  4.0 - 10.5 K/uL   RBC 3.59 (*) 3.87 - 5.11 MIL/uL   Hemoglobin 11.3 (*) 12.0 - 15.0 g/dL   HCT 78.2 (*) 95.6 - 21.3 %   MCV 91.4  78.0 - 100.0 fL   MCH 31.5  26.0 - 34.0 pg   MCHC 34.5  30.0 - 36.0 g/dL   RDW 08.6  57.8 - 46.9 %   Platelets 180  150 - 400 K/uL   Neutrophils Relative % 70  43 - 77 %   Neutro Abs 4.9  1.7 - 7.7 K/uL   Lymphocytes  Relative 25  12 - 46 %   Lymphs Abs 1.8  0.7 - 4.0 K/uL   Monocytes Relative 4  3 - 12 %   Monocytes Absolute 0.3  0.1 - 1.0 K/uL   Eosinophils Relative 0  0 - 5 %   Eosinophils Absolute 0.0  0.0 - 0.7 K/uL   Basophils  Relative 0  0 - 1 %   Basophils Absolute 0.0  0.0 - 0.1 K/uL  CORTISOL-AM, BLOOD     Status: Abnormal   Collection Time    05/26/13  1:30 PM      Result Value Range   Cortisol - AM 0.7 (*) 4.3 - 22.4 ug/dL   Comment: Performed at [facility]  COMPREHENSIVE METABOLIC PANEL     Status: Abnormal   Collection Time    05/27/13  5:05 AM      Result Value Range   Sodium 144  135 - 145 mEq/L   Potassium 3.8  3.5 - 5.1 mEq/L   Comment: DELTA CHECK NOTED   Chloride 113 (*) 96 - 112 mEq/L   CO2 23  19 - 32 mEq/L   Glucose, Bld 137 (*) 70 - 99 mg/dL   BUN 21  6 - 23 mg/dL   Creatinine, Ser 5.78 (*) 0.50 - 1.10 mg/dL   Calcium 46.9  8.4 - 62.9 mg/dL   Total Protein 5.7 (*) 6.0 - 8.3 g/dL   Albumin 3.0 (*) 3.5 - 5.2 g/dL   AST 28  0 - 37 U/L   ALT 34  0 - 35 U/L   Alkaline Phosphatase 100  39 - 117 U/L   Total Bilirubin 0.7  0.3 - 1.2 mg/dL   GFR calc non Af Amer 51 (*) >90 mL/min   GFR calc Af Amer 59 (*) >90 mL/min   Comment: (NOTE)     The eGFR has been calculated using the CKD EPI equation.     This calculation has not been validated in all clinical situations.     eGFR's persistently <90 mL/min signify possible Chronic Kidney     Disease.  MAGNESIUM     Status: None   Collection Time    05/27/13  5:05 AM      Result Value Range   Magnesium 1.7  1.5 - 2.5 mg/dL  CBC WITH DIFFERENTIAL     Status: Abnormal   Collection Time    05/27/13  5:05 AM      Result Value Range   WBC 4.6  4.0 - 10.5 K/uL   RBC 3.03 (*) 3.87 - 5.11 MIL/uL   Hemoglobin 9.5 (*) 12.0 - 15.0 g/dL   HCT 52.8 (*) 41.3 - 24.4 %   MCV 91.7  78.0 - 100.0 fL   MCH 31.4  26.0 - 34.0 pg   MCHC 34.2  30.0 - 36.0 g/dL   RDW 01.0  27.2 - 53.6 %   Platelets 172  150 - 400 K/uL   Neutrophils Relative % 71  43 - 77 %   Neutro Abs 3.3  1.7 - 7.7 K/uL   Lymphocytes Relative 25  12 - 46 %   Lymphs Abs 1.1  0.7 - 4.0 K/uL   Monocytes Relative 4  3 - 12 %   Monocytes Absolute 0.2  0.1 - 1.0 K/uL   Eosinophils  Relative 0  0 - 5 %   Eosinophils Absolute 0.0  0.0 - 0.7 K/uL   Basophils Relative 0  0 - 1 %  Basophils Absolute 0.0  0.0 - 0.1 K/uL   Labs are reviewed.  Current Facility-Administered Medications  Medication Dose Route Frequency Provider Last Rate Last Dose  . dexamethasone (DECADRON) injection 2 mg  2 mg Intravenous Q12H Drema Dallas, MD   2 mg at 05/27/13 0931  . dextrose 5 %-0.9 % sodium chloride infusion   Intravenous Continuous Kathlen Mody, MD 75 mL/hr at 05/27/13 0208    . lacosamide (VIMPAT) 50 mg in sodium chloride 0.9 % 25 mL IVPB  50 mg Intravenous Q12H Drema Dallas, MD   50 mg at 05/27/13 1020  . levETIRAcetam (KEPPRA) 500 mg in sodium chloride 0.9 % 100 mL IVPB  500 mg Intravenous Q12H Drema Dallas, MD   500 mg at 05/27/13 0931  . pantoprazole (PROTONIX) injection 40 mg  40 mg Intravenous Q24H Drema Dallas, MD   40 mg at 05/27/13 0209  . vancomycin (VANCOCIN) 500 mg in sodium chloride 0.9 % 100 mL IVPB  500 mg Intravenous Q12H Kathlen Mody, MD   500 mg at 05/27/13 0603    Psychiatric Specialty Exam:     Blood pressure 146/75, pulse 93, temperature 98.2 F (36.8 C), temperature source Oral, resp. rate 18, height 5\' 4"  (1.626 m), weight 127 lb 13.9 oz (58 kg), last menstrual period 08/26/2010, SpO2 100.00%.Body mass index is 21.94 kg/(m^2).  General Appearance: Guarded  Eye Contact::  Poor  Speech:  Slow  Volume:  Decreased  Mood:  Depressed and Dysphoric  Affect:  Constricted, Depressed, Inappropriate and Restricted  Thought Process:  Irrelevant and Loose  Orientation:  Full (Time, Place, and Person)  Thought Content:  Hallucinations: Auditory Visual and Rumination  Suicidal Thoughts:  No  Homicidal Thoughts:  No  Memory:  Immediate;   Poor Recent;   Poor Remote;   Poor  Judgement:  Fair  Insight:  Fair  Psychomotor Activity:  Decreased  Concentration:  Poor  Recall:  Fair  Akathisia:  No  Handed:  Right  AIMS (if indicated):     Assets:   Housing Social Support  Sleep:      Treatment Plan Summary: Medication management Patient appears depressed but denies any suicidal thoughts or homicidal thoughts.  Consider Lexapro 5-10 mg daily if not medically contraindicated.  Upon discharge the patient was outpatient treatment and followup.  Please call [phone] if they have any further question.  Patient does not require inpatient psychiatric services at this time.  ARFEEN,SYED T. 05/27/2013 12:43 PM

## 2013-05-27 NOTE — Progress Notes (Signed)
Clinical Social Work Department BRIEF PSYCHOSOCIAL ASSESSMENT 05/27/2013  Patient:  [name], [name]     Account Number:  [id number]     Admit date:  05/24/2013  Clinical Social Worker:  [name]  Date/Time:  05/27/2013 12:45 PM  Referred by:  Physician  Date Referred:  05/27/2013 Referred for  SNF Placement   Other Referral:   Interview type:  Patient Other interview type:    PSYCHOSOCIAL DATA Living Status:  FACILITY Admitted from facility:  [facility] [place] Level of care:  Skilled Nursing Facility Primary support name:  [name] Primary support relationship to patient:  PARENT Degree of support available:   Strong    CURRENT CONCERNS Current Concerns  Post-Acute Placement   Other Concerns:    SOCIAL WORK ASSESSMENT / PLAN CSW received referral to complete assessment due to the fact that patient was admitted from a facility. CSW reviewed chart and met with patient, dad and friend at bedside. CSW introduced myself and explained role.    Patient has been at [facility] for about 1 week prior to be admitted to the hospital. Patient laying in bed and crying at time of assessment. Patient reports she is hearing Christmas music and a lady named [name] is laying in the bed with her. Patient and family concerned about patient's wellbeing and do not wish to discuss DC plans at this time. RN reports that MD has order psych consult.    CSW spoke with SNF who is agreeable to accept patient back. When it is more appropriate, CSW will confirm with patient that she desires to return. FL2 was completed and placed on chart.   Assessment/plan status:  Psychosocial Support/Ongoing Assessment of Needs Other assessment/ plan:   Information/referral to community resources:   Will return to SNF    PATIENT'S/FAMILY'S RESPONSE TO PLAN OF CARE: Patient laying in bed and very restless. Patient hallucinating and is unable to fully participate in assessment. Patient and  family want to focus on patient eliminating hallucinations.       Carla Lightning, LCSW (Coverage for [employer])
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
## 2013-05-27 NOTE — Progress Notes (Addendum)
Subjective: The pt is lying in bed with her head covered. She keeps her eyes closed. Answers only some questions.  Objective: Current vital signs: BP 146/75  Pulse 93  Temp(Src) 98.2 F (36.8 C) (Oral)  Resp 18  Ht 5\' 4"  (1.626 m)  Wt 58 kg (127 lb 13.9 oz)  BMI 21.94 kg/m2  SpO2 100%  LMP 08/26/2010 Vital signs in last 24 hours: Temp:  [98.1 F (36.7 C)-98.2 F (36.8 C)] 98.2 F (36.8 C) (12/26 0508) Pulse Rate:  [92-93] 93 (12/26 0508) Resp:  [18] 18 (12/26 0508) BP: (131-146)/(75-82) 146/75 mmHg (12/26 0508) SpO2:  [100 %] 100 % (12/26 0508)  Intake/Output from previous day: 12/25 0701 - 12/26 0700 In: 2467.5 [I.V.:1762.5; IV Piggyback:705] Out: -  Intake/Output this shift:   Nutritional status: NPO  Physical Exam  Neurologic Exam: The patient is lethargic. She answers some questions although it is difficult to understand her answers. She follows only some commands. She knows that it is December 2014. She was uncooperative for the remainder of the exam other than moving her feet minimal and gripping with her right hand lightly. She keeps her eyes closed.    Lab Results: Basic Metabolic Panel:  Recent Labs Lab 05/24/13 2010 05/25/13 1510 05/26/13 0540 05/27/13 0505  NA 142 146* 144 144  K 3.7 4.6 4.7 3.8  CL 109 112 111 113*  CO2 22 23 21 23   GLUCOSE 94 110* 110* 137*  BUN 33* 26* 25* 21  CREATININE 1.23* 1.14* 1.17* 1.18*  CALCIUM 10.5 10.3 10.5 10.2  MG  --   --  1.9 1.7    Liver Function Tests:  Recent Labs Lab 05/24/13 2010 05/26/13 0540 05/27/13 0505  AST 36 40* 28  ALT 50* 44* 34  ALKPHOS 125* 117 100  BILITOT 0.7 0.8 0.7  PROT 6.4 6.6 5.7*  ALBUMIN 3.2* 3.3* 3.0*   No results found for this basename: LIPASE, AMYLASE,  in the last 168 hours  Recent Labs Lab 05/24/13 2010  AMMONIA 63*    CBC:  Recent Labs Lab 05/24/13 2010 05/26/13 0540 05/27/13 0505  WBC 4.3 6.9 4.6  NEUTROABS 2.1 4.9 3.3  HGB 10.5* 11.3* 9.5*  HCT  31.5* 32.8* 27.8*  MCV 92.1 91.4 91.7  PLT 179 180 172    Cardiac Enzymes: No results found for this basename: CKTOTAL, CKMB, CKMBINDEX, TROPONINI,  in the last 168 hours  Lipid Panel: No results found for this basename: CHOL, TRIG, HDL, CHOLHDL, VLDL, LDLCALC,  in the last 168 hours  CBG: No results found for this basename: GLUCAP,  in the last 168 hours  Microbiology: Results for orders placed during the hospital encounter of 05/24/13  CULTURE, BLOOD (ROUTINE X 2)     Status: None   Collection Time    05/25/13  3:05 AM      Result Value Range Status   Specimen Description BLOOD LEFT ARM   Final   Special Requests BOTTLES DRAWN AEROBIC AND ANAEROBIC 2CC   Final   Culture  Setup Time     Final   Value: 05/25/2013 08:48     Performed at [facility]   Culture     Final   Value: GRAM POSITIVE COCCI IN CLUSTERS     1610 Note: Gram Stain Report Called to,Read Back By and Verified With: Renee Harder 05/26/13 FULKC     Performed at [facility]   Report Status PENDING   Incomplete  CULTURE, BLOOD (ROUTINE X  2)     Status: None   Collection Time    05/25/13  3:20 AM      Result Value Range Status   Specimen Description BLOOD RIGHT ARM   Final   Special Requests BOTTLES DRAWN AEROBIC AND ANAEROBIC 5CC   Final   Culture  Setup Time     Final   Value: 05/25/2013 08:48     Performed at [facility]   Culture     Final   Value:        BLOOD CULTURE RECEIVED NO GROWTH TO DATE CULTURE WILL BE HELD FOR 5 DAYS BEFORE ISSUING A FINAL NEGATIVE REPORT     Performed at [facility]   Report Status PENDING   Incomplete  MRSA PCR SCREENING     Status: None   Collection Time    05/25/13  6:27 AM      Result Value Range Status   MRSA by PCR NEGATIVE  NEGATIVE Final   Comment:            The GeneXpert MRSA Assay (FDA     approved for NASAL specimens     only), is one component of a     comprehensive MRSA colonization     surveillance program. It is not      intended to diagnose MRSA     infection nor to guide or     monitor treatment for     MRSA infections.    Coagulation Studies: No results found for this basename: LABPROT, INR,  in the last 72 hours  EEG - 1) Generalized irregular slow activity.  2) Asymmetric sleep structures.   Clinical Interpretation: This EEG is consistent with a moderate non-specific generalized cerebral dysfunction. In addition, the  asymmetric sleep structures are consistent with the patient's known history of rigth sided cerebral dysfunction. There was no seizure or seizure predisposition recorded on this study.    Imaging:  Mr Laqueta Jean [facility] Contrast 05/25/2013     1. Interval evolution of right MCA infarct. A small amount of superimposed acute ischemia in the high right parietal lobe is not excluded. No evidence of acute infarct in a new territory. 2. Interval resolution of subarachnoid and intraventricular hemorrhage. 3. Interval evolution of small right subdural hematoma. 4. New 5 mm focus of nodular enhancement at posterior aspect of the right parietal cystic lesion. This may be enhancement related to recent infarct, however close follow-up is recommended to exclude tumor at this site. 5. Increased white matter T2 signal abnormality in the right hemisphere. Considerations include progressive post treatment changes versus progressive edema with or without nonenhancing tumor.      Medications:  Scheduled: . dexamethasone  2 mg Intravenous Q12H  . lacosamide (VIMPAT) IV  50 mg Intravenous Q12H  . levETIRAcetam  500 mg Intravenous Q12H  . pantoprazole (PROTONIX) IV  40 mg Intravenous Q24H  . vancomycin  500 mg Intravenous Q12H    Assessment/Plan:  MRI as noted above. The patient remains on Keppra and Vimpat.   Blood culture positive for gram-positive cocci in clusters although this may be a contaminant. The patient has been started on vancomycin.   Delton See PA-C [facility] Pager [phone] 05/27/2013, 10:19 AM    I have reviewed the above note.   52 yo F with AMS in the setting of previous infarct, tumor, and SAH. She is agitated and refusing examination, but she speaks clearly. She is ahving hallucinations per the  nurse. My suspicion continues to be delirium given that she had some improvement following initiation of antibiotics. Severe depression can also cause people to be withdrawn and combative and it may nee to be considered that there could be a psychiatric component to her current state, but this is not definite.   If no continued improvement on abx, then would consider trial of low dose neuroleptic.   Ritta Slot, MD [facility] [phone]  If 7pm- 7am, please page neurology on call at [phone].
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
## 2013-05-27 NOTE — Progress Notes (Signed)
Pt much more oriented tonight and opening her eyes without being prompted.  Pt oriented to time, place and situation.   Pt stated that we just celebrated Christmas and states that she is in "[facility]."  Pt also aware of the urge to urinate and states "I don't want to have an accident in the bed."  Will continue to monitor pt throughout the night.

## 2013-05-28 DIAGNOSIS — R569 Unspecified convulsions: Secondary | ICD-10-CM

## 2013-05-28 DIAGNOSIS — F411 Generalized anxiety disorder: Secondary | ICD-10-CM

## 2013-05-28 LAB — COMPREHENSIVE METABOLIC PANEL
AST: 32 U/L (ref 0–37)
BUN: 19 mg/dL (ref 6–23)
CO2: 22 mEq/L (ref 19–32)
Chloride: 113 mEq/L — ABNORMAL HIGH (ref 96–112)
Creatinine, Ser: 1.17 mg/dL — ABNORMAL HIGH (ref 0.50–1.10)
GFR calc non Af Amer: 52 mL/min — ABNORMAL LOW (ref >90)
Glucose, Bld: 130 mg/dL — ABNORMAL HIGH (ref 70–99)
Total Bilirubin: 0.6 mg/dL (ref 0.3–1.2)

## 2013-05-28 LAB — CBC WITH DIFFERENTIAL/PLATELET
Basophils Absolute: 0 10*3/uL (ref 0.0–0.1)
Basophils Relative: 0 % (ref 0–1)
Eosinophils Absolute: 0 10*3/uL (ref 0.0–0.7)
HCT: 29.5 % — ABNORMAL LOW (ref 36.0–46.0)
Lymphs Abs: 1.2 10*3/uL (ref 0.7–4.0)
MCV: 92.2 fL (ref 78.0–100.0)
Monocytes Relative: 5 % (ref 3–12)
Neutro Abs: 3 10*3/uL (ref 1.7–7.7)
Platelets: 182 10*3/uL (ref 150–400)
RBC: 3.2 MIL/uL — ABNORMAL LOW (ref 3.87–5.11)
RDW: 14.6 % (ref 11.5–15.5)
WBC: 4.4 10*3/uL (ref 4.0–10.5)

## 2013-05-28 LAB — MAGNESIUM: Magnesium: 1.7 mg/dL (ref 1.5–2.5)

## 2013-05-28 MED ORDER — FLUOXETINE HCL 20 MG PO CAPS
20.0000 mg | ORAL_CAPSULE | Freq: Every day | ORAL | Status: DC
  Administered 2013-05-28 – 2013-05-30 (×3): 20 mg via ORAL
  Filled 2013-05-28 (×3): qty 1

## 2013-05-28 MED ORDER — CLONAZEPAM 0.5 MG PO TABS
0.5000 mg | ORAL_TABLET | Freq: Two times a day (BID) | ORAL | Status: DC
  Administered 2013-05-28 – 2013-05-30 (×5): 0.5 mg via ORAL
  Filled 2013-05-28 (×5): qty 1

## 2013-05-28 NOTE — Progress Notes (Signed)
Subjective: More awake today, no further hallucinations per nursing.   Exam: Filed Vitals:   05/28/13 1416  BP: 115/66  Pulse: 87  Temp: 98.2 F (36.8 C)  Resp: 20   Gen: In bed, NAD MS: Awake, alert, oriented to month, year, but not place. Answers questions appropriately and follows all commands.  CN: PERRL, EOMI Motor: left arm spastic paresis,4-/5 left leg, moves right arm and leg well.  Sensory:decreased on left.    Impression: 52 yo F with AMS in the setting of previous infarct, tumor, and SAH. Likely delirium, though though severe depression can also cause people to be withdrawn and combative and it may nee to be considered that there could be a psychiatric component to her current state, but this is not definite.   With marked improvement, would not pursue  Neuroleptics at this time.    Recommendations: 1) continue antiepileptic medications 2) will continue to follow  Ritta Slot, MD [facility] [phone]  If 7pm- 7am, please page neurology on call at [phone].
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
## 2013-05-29 LAB — COMPREHENSIVE METABOLIC PANEL
AST: 30 U/L (ref 0–37)
Albumin: 2.7 g/dL — ABNORMAL LOW (ref 3.5–5.2)
Alkaline Phosphatase: 119 U/L — ABNORMAL HIGH (ref 39–117)
Calcium: 9.7 mg/dL (ref 8.4–10.5)
Chloride: 111 mEq/L (ref 96–112)
Creatinine, Ser: 1.22 mg/dL — ABNORMAL HIGH (ref 0.50–1.10)
GFR calc Af Amer: 57 mL/min — ABNORMAL LOW (ref >90)
Glucose, Bld: 134 mg/dL — ABNORMAL HIGH (ref 70–99)
Total Bilirubin: 0.5 mg/dL (ref 0.3–1.2)
Total Protein: 5.5 g/dL — ABNORMAL LOW (ref 6.0–8.3)

## 2013-05-29 LAB — VANCOMYCIN, TROUGH: Vancomycin Tr: 11 ug/mL (ref 10.0–20.0)

## 2013-05-29 LAB — CBC WITH DIFFERENTIAL/PLATELET
HCT: 30.3 % — ABNORMAL LOW (ref 36.0–46.0)
Hemoglobin: 10.5 g/dL — ABNORMAL LOW (ref 12.0–15.0)
Lymphocytes Relative: 30 % (ref 12–46)
Monocytes Absolute: 0.5 10*3/uL (ref 0.1–1.0)
Monocytes Relative: 9 % (ref 3–12)
Neutro Abs: 3.3 10*3/uL (ref 1.7–7.7)
Neutrophils Relative %: 60 % (ref 43–77)
RBC: 3.34 MIL/uL — ABNORMAL LOW (ref 3.87–5.11)
WBC: 5.5 10*3/uL (ref 4.0–10.5)

## 2013-05-29 LAB — MAGNESIUM: Magnesium: 1.8 mg/dL (ref 1.5–2.5)

## 2013-05-29 MED ORDER — DEXAMETHASONE 2 MG PO TABS
2.0000 mg | ORAL_TABLET | Freq: Two times a day (BID) | ORAL | Status: DC
  Administered 2013-05-29 – 2013-05-30 (×3): 2 mg via ORAL
  Filled 2013-05-29 (×4): qty 1

## 2013-05-29 MED ORDER — PANTOPRAZOLE SODIUM 40 MG PO TBEC
40.0000 mg | DELAYED_RELEASE_TABLET | Freq: Every day | ORAL | Status: DC
  Administered 2013-05-30: 06:00:00 40 mg via ORAL
  Filled 2013-05-29 (×2): qty 1

## 2013-05-29 MED ORDER — LEVETIRACETAM 500 MG PO TABS
500.0000 mg | ORAL_TABLET | Freq: Two times a day (BID) | ORAL | Status: DC
  Administered 2013-05-29 – 2013-05-30 (×2): 500 mg via ORAL
  Filled 2013-05-29 (×3): qty 1

## 2013-05-29 MED ORDER — LACOSAMIDE 50 MG PO TABS
50.0000 mg | ORAL_TABLET | Freq: Two times a day (BID) | ORAL | Status: DC
  Administered 2013-05-29 – 2013-05-30 (×2): 50 mg via ORAL
  Filled 2013-05-29 (×2): qty 1

## 2013-05-29 NOTE — Progress Notes (Signed)
ANTIBIOTIC CONSULT NOTE - FOLLOW UP  Pharmacy Consult for Vancomycin Indication: +Blood cultures  Allergies  Allergen Reactions  . Vasotec Other (See Comments)    Causes angioedema    Patient Measurements: Height: 5\' 4"  (162.6 cm) Weight: 154 lb 12.2 oz (70.2 kg) (blankets wrapped on bed for seizure precautions) IBW/kg (Calculated) : 54.7  Vital Signs: Temp: 97.9 F (36.6 C) (12/28 0414) Temp src: Oral (12/28 0414) BP: 117/79 mmHg (12/28 0414) Pulse Rate: 80 (12/28 0414) Intake/Output from previous day: 12/27 0701 - 12/28 0700 In: 1190 [P.O.:960; IV Piggyback:230] Out: 500 [Urine:500] Intake/Output from this shift:    Labs:  Recent Labs  05/27/13 0505 05/28/13 0603 05/29/13 0728 05/29/13 0810  WBC 4.6 4.4  --  5.5  HGB 9.5* 10.0*  --  10.5*  PLT 172 182  --  178  CREATININE 1.18* 1.17* 1.22*  --    Estimated Creatinine Clearance: 50.7 ml/min (by C-G formula based on Cr of 1.22).  60 ml/min/1.72m2 (normalized)  Recent Labs  05/29/13 0728  VANCOTROUGH 11.0     Microbiology: Recent Results (from the past 720 hour(s))  URINE CULTURE     Status: None   Collection Time    05/14/13  4:04 PM      Result Value Range Status   Specimen Description URINE, CATHETERIZED   Final   Special Requests NONE   Final   Culture  Setup Time     Final   Value: 05/14/2013 21:13     Performed at [facility] Count     Final   Value: NO GROWTH     Performed at [facility]   Culture     Final   Value: NO GROWTH     Performed at [facility]   Report Status 05/15/2013 FINAL   Final  CULTURE, BLOOD (ROUTINE X 2)     Status: None   Collection Time    05/25/13  3:05 AM      Result Value Range Status   Specimen Description BLOOD LEFT ARM   Final   Special Requests BOTTLES DRAWN AEROBIC AND ANAEROBIC 2CC   Final   Culture  Setup Time     Final   Value: 05/25/2013 08:48     Performed at [facility]   Culture     Final   Value:  STAPHYLOCOCCUS SPECIES (COAGULASE NEGATIVE)     Note: THE SIGNIFICANCE OF ISOLATING THIS ORGANISM FROM A SINGLE SET OF BLOOD CULTURES WHEN MULTIPLE SETS ARE DRAWN IS UNCERTAIN. PLEASE NOTIFY THE MICROBIOLOGY DEPARTMENT WITHIN ONE WEEK IF SPECIATION AND SENSITIVITIES ARE REQUIRED.     0417 Note: Gram Stain Report Called to,Read Back By and Verified With: Renee Harder 05/26/13 FULKC     Performed at [facility]   Report Status 05/27/2013 FINAL   Final  CULTURE, BLOOD (ROUTINE X 2)     Status: None   Collection Time    05/25/13  3:20 AM      Result Value Range Status   Specimen Description BLOOD RIGHT ARM   Final   Special Requests BOTTLES DRAWN AEROBIC AND ANAEROBIC 5CC   Final   Culture  Setup Time     Final   Value: 05/25/2013 08:48     Performed at [facility]   Culture     Final   Value:        BLOOD CULTURE RECEIVED NO GROWTH TO DATE CULTURE WILL BE HELD FOR  5 DAYS BEFORE ISSUING A FINAL NEGATIVE REPORT     Performed at [facility]   Report Status PENDING   Incomplete  MRSA PCR SCREENING     Status: None   Collection Time    05/25/13  6:27 AM      Result Value Range Status   MRSA by PCR NEGATIVE  NEGATIVE Final   Comment:            The GeneXpert MRSA Assay (FDA     approved for NASAL specimens     only), is one component of a     comprehensive MRSA colonization     surveillance program. It is not     intended to diagnose MRSA     infection nor to guide or     monitor treatment for     MRSA infections.    Anti-infectives   Start     Dose/Rate Route Frequency Ordered Stop   05/26/13 0600  vancomycin (VANCOCIN) 500 mg in sodium chloride 0.9 % 100 mL IVPB     500 mg 100 mL/hr over 60 Minutes Intravenous Every 12 hours 05/26/13 0452        Assessment: 52 yo female on day #4 vancomycin for 1 of 2 blood cultures + for coag negative staph.  Vancomycin trough subtherapeutic - drawn late ~14.5 hr after previous dose  SCr rising, MD suspects  dehydration  Afeb  WBC wnl  Goal of Therapy:  Vancomycin trough level 15-20 mcg/ml  Plan:   Even though vancomycin trough is low, will continue the same dose of 500mg  IV q12h since level was drawn 2.5 hr late and SCr is rising.  Suspect that vancomycin can be dc'd as CNS likely contaminant, patient afebrile with normal WBC. Also, may be contributing to rising SCr.  Loralee Pacas, PharmD, BCPS Pager: [phone] 05/29/2013,9:06 AM
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
## 2013-05-29 NOTE — Progress Notes (Signed)
Subjective: Continues to be markedly improved form admission. Father says she is nearing baseline.   Exam: Filed Vitals:   05/29/13 1427  BP: 126/69  Pulse: 64  Temp: 97.5 F (36.4 C)  Resp: 16   Gen: In bed, NAD MS: Awake, alert, oriented to month, year, but not place. Answers questions appropriately and follows all commands.  CN: PERRL, EOMI Motor: left arm spastic paresis,4-/5 left leg, moves right arm and leg well.  Sensory:decreased on left.    Impression: 52 yo F with AMS in the setting of previous infarct, tumor, and SAH. Likely delirium, though though severe depression can also cause people to be withdrawn and combative and it may nee to be considered that there could be a psychiatric component to her current state, but this is not definite.   With her improvement, no further recommendations from a neurological perspective.   Recommendations: 1) continue antiepileptic medications 2) please call with any further questions or concerns, neurology will sign off.   Ritta Slot, MD [facility] [phone]  If 7pm- 7am, please page neurology on call at [phone].

## 2013-05-29 NOTE — Progress Notes (Signed)
Per RN, Pt is alert and oriented and can participate in assessment.  Met with Pt to discuss d/c plans.  Pt stated that she was confused as to past events but could remember that she was at [facility] and that this was the closest facility her insurance, [employer], would approve.  Pt stated that she knows that she isn't strong enough to return home, at this time, and is agreeable to going back to [facility].  CSW thanked Pt for her time.  Weekday CSW to follow.  Providence Crosby, LCSWA Clinical Social Work [phone]'

## 2013-05-30 LAB — BASIC METABOLIC PANEL
BUN: 20 mg/dL (ref 6–23)
CO2: 21 mEq/L (ref 19–32)
Chloride: 106 mEq/L (ref 96–112)
Creatinine, Ser: 1.02 mg/dL (ref 0.50–1.10)
Glucose, Bld: 128 mg/dL — ABNORMAL HIGH (ref 70–99)
Potassium: 4.4 mEq/L (ref 3.5–5.1)

## 2013-05-30 MED ORDER — CLOPIDOGREL BISULFATE 75 MG PO TABS
75.0000 mg | ORAL_TABLET | Freq: Every day | ORAL | Status: DC
Start: 2013-05-30 — End: 2013-05-30
  Administered 2013-05-30: 75 mg via ORAL
  Filled 2013-05-30 (×2): qty 1

## 2013-05-30 MED ORDER — FLUOXETINE HCL 20 MG PO CAPS: 20.0000 mg | ORAL_CAPSULE | Freq: Every day | ORAL | Status: DC

## 2013-05-30 MED ORDER — CLONAZEPAM 0.5 MG PO TABS: 0.5000 mg | ORAL_TABLET | Freq: Two times a day (BID) | ORAL | Status: DC

## 2013-05-30 MED ORDER — STARCH (THICKENING) PO POWD: ORAL | Status: DC

## 2013-05-30 MED ORDER — ASPIRIN EC 325 MG PO TBEC
325.0000 mg | DELAYED_RELEASE_TABLET | Freq: Every day | ORAL | Status: DC
  Administered 2013-05-30: 10:00:00 325 mg via ORAL
  Filled 2013-05-30: qty 1

## 2013-05-30 MED ORDER — ASPIRIN EC 325 MG PO TBEC: 325.0000 mg | DELAYED_RELEASE_TABLET | Freq: Every day | ORAL | Status: DC

## 2013-05-30 MED ORDER — METHYLPHENIDATE HCL 5 MG PO TABS
5.0000 mg | ORAL_TABLET | Freq: Two times a day (BID) | ORAL | Status: DC
  Administered 2013-05-30 (×2): 5 mg via ORAL
  Filled 2013-05-30 (×2): qty 1

## 2013-05-30 NOTE — Discharge Summary (Signed)
  Physician Discharge Summary  Patient ID: [name] MRN: [id number] DOB/AGE: 1960-10-14 52 y.o.  Admit date: 04/18/2013 Discharge date: 05/30/2013  Admission Diagnoses: Subarachnoid Hemorrhage  Discharge Diagnoses:  Subarachnoid Hemorrhage Stroke Right ICA Aneurysm Active Problems:   Protein-calorie malnutrition, severe   SAH (subarachnoid hemorrhage)   Cerebral aneurysm, nonruptured   Discharged Condition: Stable  Hospital Course:  [name] is a 52 y.o. female with a history of right fronto-parietal astrocytoma resected in 2012 followed by radiation therapy. She was recently found as an outpatient to have a right ICA aneurysm seen on CTA. She was then seen in the ED with mental status changes and left-sided weakness over a 1 week period after a fall, and was found to have a primarily right convexity SAH. She was admitted to the ICU for close observation and subsequent workup included MRI/MRA demonstrating a right posterior temporal stroke with right-sided MCA and PCA stenosis possibly indicative of vasospasm. Diagnostic angiogram was done further delineating the previously seen RICA aneurysm. She continued to work with PT and OT and was started on ASA and Plavix in preparation for Pipeline embolization of the RICA aneurysm on a subacute basis. She remained neurologically stable with left hemiparesis and underwent embolization with the Pipeline device on 05/02/13 without complication. She was returned to the ICU where she remained at baseline. She was seen by PT/OT again and recommended for D/C to SNF. She was discharged in stable condition.  Treatments: Surgery - Pipeline embolization RICA aneurysm  Discharge Exam: Blood pressure 124/83, pulse 86, temperature 98.7 F (37.1 C), temperature source Oral, resp. rate 18, height 5\' 4"  (1.626 m), weight 61.961 kg (136 lb 9.6 oz), last menstrual period 08/26/2010, SpO2 99.00%. Awake, alert, oriented Speech dysarthric CN  grossly intact with mild left CN VII 5/5 RUE/RLE 3-4/5 LUE/LLE Groin site c/d/i  Follow-up: Follow-up in my office [facility] [phone]) in 4 weeks  Disposition: 03-Skilled Nursing Facility   Future Appointments Provider Department Dept Phone   06/24/2013 12:20 PM Ranelle Oyster, MD [facility] [phone]       Medication List    ASK your doctor about these medications       clonazePAM 0.5 MG tablet  Commonly known as:  KLONOPIN  Take 1 tablet (0.5 mg total) by mouth 2 (two) times daily as needed for anxiety. For anxiety     levETIRAcetam 500 MG tablet  Commonly known as:  KEPPRA  Take 1 tablet (500 mg total) by mouth every 12 (twelve) hours.     multivitamin tablet  Take 1 tablet by mouth daily.         SignedLisbeth Renshaw, C 05/30/2013, 10:00 AM

## 2013-05-30 NOTE — Progress Notes (Addendum)
Patient cleared for discharge. CSW spoke with Sharlette Dense at [facility]. She states that patient is fine to return. Packet copied.  Yadiel Aubry C. Latoyia Tecson MSW, [place] [phone] CSW met with patient. Patient is aware and agreeable to discharge back to [facility]. ptar called for transportation. CSW called patient's sister, Anne Shutter, left voicemail informing of discharge.  Sabian Kuba C. Anet Logsdon MSW, LCSW [phone]
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
## 2013-05-31 ENCOUNTER — Non-Acute Institutional Stay (SKILLED_NURSING_FACILITY): Payer: PRIVATE HEALTH INSURANCE | Admitting: Internal Medicine

## 2013-05-31 ENCOUNTER — Encounter: Payer: Self-pay | Admitting: Internal Medicine

## 2013-05-31 ENCOUNTER — Other Ambulatory Visit: Payer: Self-pay | Admitting: *Deleted

## 2013-05-31 DIAGNOSIS — I671 Cerebral aneurysm, nonruptured: Secondary | ICD-10-CM

## 2013-05-31 DIAGNOSIS — R3989 Other symptoms and signs involving the genitourinary system: Secondary | ICD-10-CM

## 2013-05-31 DIAGNOSIS — R569 Unspecified convulsions: Secondary | ICD-10-CM

## 2013-05-31 DIAGNOSIS — F341 Dysthymic disorder: Secondary | ICD-10-CM

## 2013-05-31 DIAGNOSIS — M62838 Other muscle spasm: Secondary | ICD-10-CM

## 2013-05-31 DIAGNOSIS — R748 Abnormal levels of other serum enzymes: Secondary | ICD-10-CM

## 2013-05-31 DIAGNOSIS — C719 Malignant neoplasm of brain, unspecified: Secondary | ICD-10-CM

## 2013-05-31 DIAGNOSIS — R4182 Altered mental status, unspecified: Secondary | ICD-10-CM

## 2013-05-31 DIAGNOSIS — F418 Other specified anxiety disorders: Secondary | ICD-10-CM | POA: Insufficient documentation

## 2013-05-31 DIAGNOSIS — I609 Nontraumatic subarachnoid hemorrhage, unspecified: Secondary | ICD-10-CM

## 2013-05-31 DIAGNOSIS — R7989 Other specified abnormal findings of blood chemistry: Secondary | ICD-10-CM

## 2013-05-31 LAB — CULTURE, BLOOD (ROUTINE X 2): Culture: NO GROWTH

## 2013-05-31 MED ORDER — METHYLPHENIDATE HCL 5 MG PO TABS: ORAL_TABLET | ORAL | Status: DC

## 2013-05-31 MED ORDER — CLONAZEPAM 0.5 MG PO TABS: 0.5000 mg | ORAL_TABLET | Freq: Two times a day (BID) | ORAL | Status: DC

## 2013-05-31 MED ORDER — LACOSAMIDE 50 MG PO TABS: 50.0000 mg | ORAL_TABLET | Freq: Two times a day (BID) | ORAL | Status: DC

## 2013-05-31 NOTE — Assessment & Plan Note (Signed)
Elevated without known cause, does not appear to be biliary-resolved on its own

## 2013-05-31 NOTE — Assessment & Plan Note (Signed)
Prior 6mm R ICA anyeurysm , not changed

## 2013-05-31 NOTE — Assessment & Plan Note (Signed)
improved

## 2013-05-31 NOTE — Assessment & Plan Note (Addendum)
Pt started on clonazepam in hospital and will continue

## 2013-05-31 NOTE — Assessment & Plan Note (Signed)
Neuro consult rec continue present meds, keppra and vimpat

## 2013-05-31 NOTE — Assessment & Plan Note (Signed)
Presented with staring and not speaking, makes eye contact. Has occurred in past but never lasted for 5 days; work up showed no acute-MRI brain, EEG, U/A, BC - resolved spontaneously

## 2013-05-31 NOTE — Assessment & Plan Note (Signed)
12/25 25/1.17; 12/29  20/1.02 - resolved and Cr baseline

## 2013-05-31 NOTE — Progress Notes (Signed)
MRN: [id number] Name: [name]  Sex: female Age: 52 y.o. DOB: 11-29-1960  PSC #: Ronni Rumble Facility/Room: 123A Level Of Care: SNF Provider: Merrilee Seashore D Emergency Contacts: Extended Emergency Contact Information Primary Emergency Contact: Altier,Tim Address: [address]          [place], [place] [address] [place] Home Phone: [phone] Mobile Phone: [phone] Relation: Spouse Secondary Emergency Contact: Nelson,Bob  [place] Mobile Phone: [phone] Relation: Father  Code Status:   Allergies: Scientific laboratory technician Complaint  Patient presents with  . nursing home admission    HPI: Patient is 52 y.o. female who had a mental status change, resolved spontaneously with a neg w/u at the hospital, readmitted for care s/p CVA, subdural hematoma and SAH.   Past Medical History  Diagnosis Date  . 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 Fracture, ankle 2012    right  . History of radiation therapy 09/23/10- 11/07/10    right parietal lobe  . Chronic headaches     uses oxycodone appropriately  . Anxiety   . Muscle spasticity   . Seizures     Past Surgical History  Procedure Laterality Date  . Orif ankle fracture  08/22/2010    right  . Radiology with anesthesia N/A 05/02/2013    Procedure: RADIOLOGY WITH ANESTHESIA;  Surgeon: [name], MD;  Location: [facility] OR;  Service: Radiology;  Laterality: N/A;  . Brain surgery Right 2012    astrocytoma R parietal lobe      Medication List       This list is accurate as of: 05/31/13 11:04 AM.  Always use your most recent med list.               aspirin 325 MG EC tablet  Take 325 mg by mouth daily.     clonazePAM 0.5 MG tablet  Commonly known as:  KLONOPIN  Take 1 tablet (0.5 mg total) by mouth 2 (two) times daily.     clopidogrel 75 MG tablet  Commonly known as:  PLAVIX  Take 75 mg by mouth daily with breakfast.     dexamethasone 2 MG tablet  Commonly  known as:  DECADRON  Take 2 mg by mouth every 12 (twelve) hours.     FLUoxetine 20 MG capsule  Commonly known as:  PROZAC  Take 1 capsule (20 mg total) by mouth daily.     food thickener Powd  Commonly known as:  THICK IT  As needed     lacosamide 50 MG Tabs tablet  Commonly known as:  VIMPAT  Take 1 tablet (50 mg total) by mouth 2 (two) times daily.     levETIRAcetam 500 MG tablet  Commonly known as:  KEPPRA  Take 1 tablet (500 mg total) by mouth every 12 (twelve) hours.     methylphenidate 5 MG tablet  Commonly known as:  RITALIN  Take one tablet by mouth twice daily     multivitamin tablet  Take 1 tablet by mouth daily.     pantoprazole 40 MG tablet  Commonly known as:  PROTONIX  Take 40 mg by mouth daily.        No orders of the defined types were placed in this encounter.    Immunization History  Administered Date(s) Administered  . Influenza,inj,Quad PF,36+ Mos 05/26/2013  . Pneumococcal Polysaccharide-23 05/26/2013  . Tdap 08/30/2011    History  Substance Use Topics  . Smoking status: Never  Smoker   . Smokeless tobacco: Never Used  . Alcohol Use: 0.0 oz/week    3-4 Cans of beer per week    Family history is noncontributory    Review of Systems  DATA OBTAINED: from patient, husband GENERAL: Feels well no fevers, fatigue, appetite changes SKIN: No itching, rash or wounds EYES: No eye pain, redness, discharge EARS: No earache, tinnitus, change in hearing NOSE: No congestion, drainage or bleeding  MOUTH/THROAT: No mouth or tooth pain, No sore throat, No difficulty chewing or swallowing  RESPIRATORY: No cough, wheezing, SOB CARDIAC: No chest pain, palpitations, lower extremity edema  GI: No abdominal pain, No N/V/D or constipation, No heartburn or reflux  GU: No dysuria, frequency or urgency, or incontinence  MUSCULOSKELETAL: No unrelieved bone/joint pain NEUROLOGIC: No headache, dizziness or new focal weakness PSYCHIATRIC: No overt anxiety or  sadness. Sleeps well. No behavior issue.   There were no vitals filed for this visit.  Physical Exam  GENERAL APPEARANCE: Alert, speaks a few words which is her baselune. Appropriately groomed. No acute distress.  SKIN: No diaphoresis rash, or wounds HEAD: Normocephalic, atraumatic  EYES: Conjunctiva/lids clear. Pupils round, reactive. EOMs intact.  EARS: External exam WNL, canals clear. Hearing grossly normal.  NOSE: No deformity or discharge.  MOUTH/THROAT: Lips w/o lesions. RESPIRATORY: Breathing is even, unlabored. Lung sounds are clear   CARDIOVASCULAR: Heart RRR no murmurs, rubs or gallops. No peripheral edema.  GASTROINTESTINAL: Abdomen is soft, non-tender, not distended w/ normal bowel sounds. GENITOURINARY: Bladder non tender, not distended  MUSCULOSKELETAL: contractures NEUROLOGIC: Oriented X3. l side paresis, chronic PSYCHIATRIC: Mood and affect- calm, no behavioral issues  Patient Active Problem List   Diagnosis Date Noted  . Depression with anxiety 05/31/2013  . Elevated alkaline phosphatase level 05/25/2013  . Prerenal azotemia 05/25/2013  . Mental status change 05/25/2013  . Anxiety   . Seizures   . Muscle spasticity   . Subarachnoid hemorrhage 05/05/2013  . SAH (subarachnoid hemorrhage) 04/21/2013  . Cerebral aneurysm, nonruptured 04/21/2013  . Protein-calorie malnutrition, severe 04/19/2013  . History of radiation therapy   . 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    CBC    Component Value Date/Time   WBC 5.5 05/29/2013 0810   WBC 3.3* 12/07/2012 1552   RBC 3.34* 05/29/2013 0810   RBC 3.72 12/07/2012 1552   HGB 10.5* 05/29/2013 0810   HGB 11.9 12/07/2012 1552   HCT 30.3* 05/29/2013 0810   HCT 34.6* 12/07/2012 1552   PLT 178 05/29/2013 0810   PLT 97* 12/07/2012 1552   MCV 90.7 05/29/2013 0810   MCV 92.9 12/07/2012 1552   LYMPHSABS 1.7 05/29/2013 0810   LYMPHSABS 0.7* 12/07/2012 1552   MONOABS 0.5 05/29/2013 0810   MONOABS 0.3 12/07/2012 1552   EOSABS 0.0  05/29/2013 0810   EOSABS 0.1 12/07/2012 1552   BASOSABS 0.0 05/29/2013 0810   BASOSABS 0.0 12/07/2012 1552    CMP     Component Value Date/Time   NA 135 05/30/2013 0536   NA 144 12/07/2012 1552   K 4.4 05/30/2013 0536   K 4.0 12/07/2012 1552   CL 106 05/30/2013 0536   CL 105 08/31/2012 1523   CO2 21 05/30/2013 0536   CO2 31* 12/07/2012 1552   GLUCOSE 128* 05/30/2013 0536   GLUCOSE 112 12/07/2012 1552   GLUCOSE 97 08/31/2012 1523   BUN 20 05/30/2013 0536   BUN 12.2 12/07/2012 1552   CREATININE 1.02 05/30/2013 0536   CREATININE 1.0 12/07/2012 1552   CALCIUM 9.5 05/30/2013  0536   CALCIUM 10.4 12/07/2012 1552   PROT 5.5* 05/29/2013 0728   PROT 6.5 12/07/2012 1552   ALBUMIN 2.7* 05/29/2013 0728   ALBUMIN 3.2* 12/07/2012 1552   AST 30 05/29/2013 0728   AST 56* 12/07/2012 1552   ALT 34 05/29/2013 0728   ALT 27 12/07/2012 1552   ALKPHOS 119* 05/29/2013 0728   ALKPHOS 127 12/07/2012 1552   BILITOT 0.5 05/29/2013 0728   BILITOT 1.08 12/07/2012 1552   GFRNONAA 61* 05/30/2013 0536   GFRAA 71* 05/30/2013 0536    Assessment and Plan  Mental status change Presented with staring and not speaking, makes eye contact. Has occurred in past but never lasted for 5 days; work up showed no acute-MRI brain, EEG, U/A, BC - resolved spontaneously  Seizures Neuro consult rec continue present meds, keppra and vimpat  Prerenal azotemia 12/25 25/1.17; 12/29  20/1.02 - resolved and Cr baseline  Elevated alkaline phosphatase level Elevated without known cause, does not appear to be biliary-resolved on its own  Astrocytoma brain tumor CT head-no recurrence;MRI showed enhancement, unclear what it means  SAH (subarachnoid hemorrhage) No evidence of recurrent bleed  Cerebral aneurysm, nonruptured Prior 6mm R ICA anyeurysm , not changed  Muscle spasticity improved  Depression with anxiety Pt started on clonazepam in hospital and will continue    Margit Hanks, MD

## 2013-05-31 NOTE — Assessment & Plan Note (Signed)
No evidence of recurrent bleed

## 2013-05-31 NOTE — Assessment & Plan Note (Signed)
CT head-no recurrence;MRI showed enhancement, unclear what it means

## 2013-06-03 LAB — CULTURE, BLOOD (ROUTINE X 2)
Culture: NO GROWTH
Culture: NO GROWTH

## 2013-06-21 ENCOUNTER — Encounter: Payer: Self-pay | Admitting: Internal Medicine

## 2013-06-21 ENCOUNTER — Non-Acute Institutional Stay (SKILLED_NURSING_FACILITY): Payer: PRIVATE HEALTH INSURANCE | Admitting: Internal Medicine

## 2013-06-21 DIAGNOSIS — R1310 Dysphagia, unspecified: Secondary | ICD-10-CM | POA: Insufficient documentation

## 2013-06-21 DIAGNOSIS — F418 Other specified anxiety disorders: Secondary | ICD-10-CM

## 2013-06-21 DIAGNOSIS — R569 Unspecified convulsions: Secondary | ICD-10-CM

## 2013-06-21 DIAGNOSIS — K219 Gastro-esophageal reflux disease without esophagitis: Secondary | ICD-10-CM | POA: Insufficient documentation

## 2013-06-21 DIAGNOSIS — R259 Unspecified abnormal involuntary movements: Secondary | ICD-10-CM

## 2013-06-21 DIAGNOSIS — F341 Dysthymic disorder: Secondary | ICD-10-CM

## 2013-06-21 NOTE — Assessment & Plan Note (Addendum)
No incident of seizure;stable on keppra, vimpat and chronic decadron

## 2013-06-21 NOTE — Assessment & Plan Note (Signed)
Continue protonix  

## 2013-06-21 NOTE — Assessment & Plan Note (Signed)
Pt continues on prozac and Klonopin;rehab is very slow so circumstances have not changed much;continue observation

## 2013-06-21 NOTE — Assessment & Plan Note (Signed)
Noted none today;apparently ritalin BID is contolling

## 2013-06-21 NOTE — Progress Notes (Signed)
MRN: [id number] Name: [name]  Sex: female Age: 53 y.o. DOB: 1960/10/31  [place] #: Karren Burly Facility/Room: 123A Level Of Care: SNF Provider: Inocencio Homes D Emergency Contacts: Extended Emergency Contact Information Primary Emergency Contact: Kokesh,Tim Address: [address]          [place], [place] [address] [place] Phone: [phone] Mobile Phone: [phone] Relation: Spouse Secondary Emergency Contact: Kimberlee Nearing [place] Mobile Phone: [phone] Relation: Father  Code Status:   Allergies: Vasotec  Chief Complaint  Patient presents with  . Medical Managment of Chronic Issues    HPI: Patient is 53 y.o. female who is being seen 3 weeks after being admitted for follow up of chronic problems.  Past Medical History  Diagnosis Date  . 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 Fracture, ankle 2012    right  . History of radiation therapy 09/23/10- 11/07/10    right parietal lobe  . Chronic headaches     uses oxycodone appropriately  . Anxiety   . Muscle spasticity   . Seizures   . Dysphagia     Past Surgical History  Procedure Laterality Date  . Orif ankle fracture  08/22/2010    right  . Radiology with anesthesia N/A 05/02/2013    Procedure: RADIOLOGY WITH ANESTHESIA;  Surgeon: Consuella Lose, MD;  Location: [facility];  Service: Radiology;  Laterality: N/A;  . Brain surgery Right 2012    astrocytoma R parietal lobe      Medication List       This list is accurate as of: 06/21/13  9:29 PM.  Always use your most recent med list.               aspirin 325 MG EC tablet  Take 325 mg by mouth daily.     clopidogrel 75 MG tablet  Commonly known as:  PLAVIX  Take 75 mg by mouth daily with breakfast.     dexamethasone 2 MG tablet  Commonly known as:  DECADRON  Take 2 mg by mouth every 12 (twelve) hours.     food thickener Powd  Commonly known as:  THICK IT  As needed     lacosamide 50 MG  Tabs tablet  Commonly known as:  VIMPAT  Take 1 tablet (50 mg total) by mouth 2 (two) times daily.     levETIRAcetam 500 MG tablet  Commonly known as:  KEPPRA  Take 1 tablet (500 mg total) by mouth every 12 (twelve) hours.     LORazepam 0.5 MG tablet  Commonly known as:  ATIVAN  Take 0.5 mg by mouth every 6 (six) hours as needed for anxiety.     methylphenidate 5 MG tablet  Commonly known as:  RITALIN  Take one tablet by mouth twice daily     multivitamin tablet  Take 1 tablet by mouth daily.     pantoprazole 40 MG tablet  Commonly known as:  PROTONIX  Take 40 mg by mouth daily.     PARoxetine 25 MG 24 hr tablet  Commonly known as:  PAXIL-CR  Take 25 mg by mouth daily.     PARoxetine 37.5 MG 24 hr tablet  Commonly known as:  PAXIL-CR  Take 37.5 mg by mouth daily.        Meds ordered this encounter  Medications  . LORazepam (ATIVAN) 0.5 MG tablet    Sig: Take 0.5 mg by mouth every 6 (six) hours as needed  for anxiety.  Marland Kitchen PARoxetine (PAXIL-CR) 25 MG 24 hr tablet    Sig: Take 25 mg by mouth daily.  Marland Kitchen PARoxetine (PAXIL-CR) 37.5 MG 24 hr tablet    Sig: Take 37.5 mg by mouth daily.    Immunization History  Administered Date(s) Administered  . Influenza,inj,Quad PF,36+ Mos 05/26/2013  . Pneumococcal Polysaccharide-23 05/26/2013  . Tdap 08/30/2011    History  Substance Use Topics  . Smoking status: Never Smoker   . Smokeless tobacco: Never Used  . Alcohol Use: 0.0 oz/week    3-4 Cans of beer per week    Review of Systems  DATA OBTAINED: from patient; PT HAS NO C/O TODAY GENERAL: Feels well no fevers, fatigue, appetite changes SKIN: No itching, rash HEENT: No complaint RESPIRATORY: No cough, wheezing, SOB CARDIAC: No chest pain, palpitations, lower extremity edema  GI: No abdominal pain, No N/V/D or constipation, No heartburn or reflux  GU: No dysuria, frequency or urgency, or incontinence  MUSCULOSKELETAL: No unrelieved bone/joint pain NEUROLOGIC: No  headache, dizziness or focal weakness PSYCHIATRIC: No overt anxiety or sadness. Sleeps well.   Filed Vitals:   06/21/13 2047  BP: 130/91  Pulse: 90  Temp: 97.4 F (36.3 C)  Resp: 19    Physical Exam  GENERAL APPEARANCE: Alert, mod conversant, difficult speech. Appropriately groomed. No acute distress  SKIN: No diaphoresis rash HEENT: Unremarkable RESPIRATORY: Breathing is even, unlabored. Lung sounds are clear   CARDIOVASCULAR: Heart RRR no murmurs, rubs or gallops. No peripheral edema  GASTROINTESTINAL: Abdomen is soft, non-tender, not distended w/ normal bowel sounds.  GENITOURINARY: Bladder non tender, not distended  MUSCULOSKELETAL: contracture LUE NEUROLOGIC: L facial and LUE and LLE weakness PSYCHIATRIC: depressed affect, no behavioral issues  Patient Active Problem List   Diagnosis Date Noted  . Involuntary movements 06/21/2013  . Dysphagia   . Depression with anxiety 05/31/2013  . Elevated alkaline phosphatase level 05/25/2013  . Prerenal azotemia 05/25/2013  . Mental status change 05/25/2013  . Anxiety   . Seizures   . Muscle spasticity   . Subarachnoid hemorrhage 05/05/2013  . SAH (subarachnoid hemorrhage) 04/21/2013  . Cerebral aneurysm, nonruptured 04/21/2013  . Protein-calorie malnutrition, severe 04/19/2013  . History of radiation therapy   . 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    CBC    Component Value Date/Time   WBC 5.5 05/29/2013 0810   WBC 3.3* 12/07/2012 1552   RBC 3.34* 05/29/2013 0810   RBC 3.72 12/07/2012 1552   HGB 10.5* 05/29/2013 0810   HGB 11.9 12/07/2012 1552   HCT 30.3* 05/29/2013 0810   HCT 34.6* 12/07/2012 1552   PLT 178 05/29/2013 0810   PLT 97* 12/07/2012 1552   MCV 90.7 05/29/2013 0810   MCV 92.9 12/07/2012 1552   LYMPHSABS 1.7 05/29/2013 0810   LYMPHSABS 0.7* 12/07/2012 1552   MONOABS 0.5 05/29/2013 0810   MONOABS 0.3 12/07/2012 1552   EOSABS 0.0 05/29/2013 0810   EOSABS 0.1 12/07/2012 1552   BASOSABS 0.0 05/29/2013 0810   BASOSABS 0.0  12/07/2012 1552    CMP     Component Value Date/Time   NA 135 05/30/2013 0536   NA 144 12/07/2012 1552   K 4.4 05/30/2013 0536   K 4.0 12/07/2012 1552   CL 106 05/30/2013 0536   CL 105 08/31/2012 1523   CO2 21 05/30/2013 0536   CO2 31* 12/07/2012 1552   GLUCOSE 128* 05/30/2013 0536   GLUCOSE 112 12/07/2012 1552   GLUCOSE 97 08/31/2012 1523   BUN 20 05/30/2013  0536   BUN 12.2 12/07/2012 1552   CREATININE 1.02 05/30/2013 0536   CREATININE 1.0 12/07/2012 1552   CALCIUM 9.5 05/30/2013 0536   CALCIUM 10.4 12/07/2012 1552   PROT 5.5* 05/29/2013 0728   PROT 6.5 12/07/2012 1552   ALBUMIN 2.7* 05/29/2013 0728   ALBUMIN 3.2* 12/07/2012 1552   AST 30 05/29/2013 0728   AST 56* 12/07/2012 1552   ALT 34 05/29/2013 0728   ALT 27 12/07/2012 1552   ALKPHOS 119* 05/29/2013 0728   ALKPHOS 127 12/07/2012 1552   BILITOT 0.5 05/29/2013 0728   BILITOT 1.08 12/07/2012 1552   GFRNONAA 61* 05/30/2013 0536   GFRAA 71* 05/30/2013 0536    Assessment and Plan  Seizures No incident of seizure;stable on keppra, vimpat and chronic decadron  Depression with anxiety Pt continues on prozac and Klonopin;rehab is very slow so circumstances have not changed much;continue observation  GERD (gastroesophageal reflux disease) And dysphagia -continue protonix 40 mg daily  Dysphagia Continue protonix  Involuntary movements Noted none today;apparently ritalin BID is contolling    Hennie Duos, MD

## 2013-06-21 NOTE — Assessment & Plan Note (Signed)
And dysphagia -continue protonix 40 mg daily

## 2013-06-24 ENCOUNTER — Encounter
Payer: PRIVATE HEALTH INSURANCE | Attending: Physical Medicine & Rehabilitation | Admitting: Physical Medicine & Rehabilitation

## 2013-06-24 ENCOUNTER — Encounter: Payer: Self-pay | Admitting: Physical Medicine & Rehabilitation

## 2013-06-24 VITALS — BP 127/81 | HR 86 | Resp 14

## 2013-06-24 DIAGNOSIS — F3289 Other specified depressive episodes: Secondary | ICD-10-CM | POA: Diagnosis not present

## 2013-06-24 DIAGNOSIS — R5383 Other fatigue: Secondary | ICD-10-CM

## 2013-06-24 DIAGNOSIS — R5381 Other malaise: Secondary | ICD-10-CM | POA: Diagnosis not present

## 2013-06-24 DIAGNOSIS — G40909 Epilepsy, unspecified, not intractable, without status epilepticus: Secondary | ICD-10-CM | POA: Diagnosis not present

## 2013-06-24 DIAGNOSIS — F341 Dysthymic disorder: Secondary | ICD-10-CM

## 2013-06-24 DIAGNOSIS — R4182 Altered mental status, unspecified: Secondary | ICD-10-CM

## 2013-06-24 DIAGNOSIS — F411 Generalized anxiety disorder: Secondary | ICD-10-CM | POA: Diagnosis not present

## 2013-06-24 DIAGNOSIS — F418 Other specified anxiety disorders: Secondary | ICD-10-CM

## 2013-06-24 DIAGNOSIS — I609 Nontraumatic subarachnoid hemorrhage, unspecified: Secondary | ICD-10-CM

## 2013-06-24 DIAGNOSIS — I69959 Hemiplegia and hemiparesis following unspecified cerebrovascular disease affecting unspecified side: Secondary | ICD-10-CM | POA: Diagnosis present

## 2013-06-24 DIAGNOSIS — F329 Major depressive disorder, single episode, unspecified: Secondary | ICD-10-CM | POA: Insufficient documentation

## 2013-06-24 DIAGNOSIS — M62838 Other muscle spasm: Secondary | ICD-10-CM

## 2013-06-24 DIAGNOSIS — R569 Unspecified convulsions: Secondary | ICD-10-CM

## 2013-06-24 NOTE — Progress Notes (Signed)
Subjective:    Patient ID: [name], female    DOB: [birth year], 53 y.o.   MRN: [id number]  HPI  [name] is back regarding her ICH and spastic left hemiparesis.  She has been at [facility] for the last month for ongoing care and rehab.  She has been increasingly lethargic per her family over the last 7-10 days. It's not clear as to what's been done in this regard. I was able to review a note from 1/20 which made no reference of lethargy. I reviewed her medication list which was not consistent with what we recommended at discharge including increased keppra, omission of prozac.   Pt could not provide me a reliable history today and family didn't offer much more than that she has not had seizures since being at the SNF.   Pain Inventory Average Pain 5 Pain Right Now 5 My pain is dull  In the last 24 hours, has pain interfered with the following? General activity 10 Relation with others 10 Enjoyment of life 10 What TIME of day is your pain at its worst? varies Sleep (in general) Fair  Pain is worse with: na Pain improves with: na Relief from Meds: no pain meds  Mobility ability to climb steps?  no do you drive?  no use a wheelchair needs help with transfers  Function disabled: date disabled na  Neuro/Psych confusion depression anxiety  Prior Studies Any changes since last visit?  no  Physicians involved in your care Any changes since last visit?  no   Family History  Problem Relation Age of Onset  . Breast cancer Mother    History   Social History  . Marital Status: Married    Spouse Name: N/A    Number of Children: 1  . Years of Education: N/A   Occupational History  .      unemployed   Social History Main Topics  . Smoking status: Never Smoker   . Smokeless tobacco: Never Used  . Alcohol Use: 0.0 oz/week    3-4 Cans of beer per week  . Drug Use: No  . Sexual Activity: None   Other Topics Concern  . None   Social History Narrative  .  None   Past Surgical History  Procedure Laterality Date  . Orif ankle fracture  08/22/2010    right  . Radiology with anesthesia N/A 05/02/2013    Procedure: RADIOLOGY WITH ANESTHESIA;  Surgeon: Consuella Lose, MD;  Location: [facility];  Service: Radiology;  Laterality: N/A;  . Brain surgery Right 2012    astrocytoma R parietal lobe   Past Medical History  Diagnosis Date  . 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 Fracture, ankle 2012    right  . History of radiation therapy 09/23/10- 11/07/10    right parietal lobe  . Chronic headaches     uses oxycodone appropriately  . Anxiety   . Muscle spasticity   . Seizures   . Dysphagia    BP 127/81  Pulse 86  Resp 14  SpO2 98%  LMP 08/26/2010      Review of Systems  Psychiatric/Behavioral: Positive for confusion and dysphoric mood. The patient is nervous/anxious.   All other systems reviewed and are negative.       Objective:   Physical Exam Eyes:  Pupils reactive to light  Neck: Normal range of motion. Neck supple. No thyromegaly present.  Cardiovascular: Normal rate and regular rhythm. No murmurs  Respiratory:  Effort normal and breath sounds normal. No respiratory distress. No wheezes  GI: Soft. Bowel sounds are normal. She exhibits no distension.  Musculoskeletal: She exhibits no pain with shoulder range of motion , no calf or thigh tenderness to palpation or swelling   Neurological:  Patient is lethargic. Perseverative.  frequently speaking incoherently. Could follow simple commands. Kept eyes closed. Right gaze preference. Left scm tight. Left facial droop with drooling 4 minus/5 in the right deltoid, bicep, tricep, grip, hip flexor, knee extensors, ankle dorsi flexion plantar flexor  Left upper extremity has 1+ pec, trace bicep, deltoid, tricep, 0 at hand. pec tone better, trace  Left lower extremity has 1 hip extension, 1 to 2- HAD, HAB, 1+ to2 KE, tr to 1 distally.  Left inattention when testing  strength. Reduced sensation tin LUE and LLE 2/4 tone Left pecs and LLE 1-2/4.      Assessment/Plan:  1. 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 Depression with anxiety 3. Increased lethargy 4. Seizure disorder   Plan:  1. REDUCE KEPPRA TO 500MG  BID 2. RESUME PROZAC 20MG  DAILY 3. INCREASE RITALIN TO 10MG  DAILY WITH BREAKFAST AND LUNCH 4. CHECK BMET AND CBC AS WELL AS URINALYSIS AND CULTURE 5. CONTINUE THERAPIES WITH SPEECH, PT, AND OT 6. STOP DECADRON 7. FOLLOW UP WITH ME IN ABOUT 2 MONTHS.   RECOMMEND CT OF THE BRAIN IF SHE DOES NOT SHOW IMPROVEMENT IN HER LETHARGY BY THE END OF NEXT WEEK   Consider botox for left SCM. For now therapy will need to work on ROM, posture, stretching

## 2013-06-24 NOTE — Patient Instructions (Signed)
1. 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 Depression with anxiety 3. Increased lethargy 4. Seizure disorder   Plan:  1. REDUCE KEPPRA TO 500MG  BID 2. RESUME PROZAC 20MG  DAILY 3. INCREASE RITALIN TO 10MG  DAILY WITH BREAKFAST AND LUNCH 4. CHECK BMET AND CBC AS WELL AS URINALYSIS AND CULTURE 5. CONTINUE THERAPIES WITH SPEECH, PT, AND OT 6. STOP DECADRON 7. FOLLOW UP WITH ME IN ABOUT 2 MONTHS.   RECOMMEND CT OF THE BRAIN IF SHE DOES NOT SHOW IMPROVEMENT IN HER LETHARGY BY THE END OF NEXT WEEK   PLEASE FORWARD LAB RESULTS TO ME: [phone]  [place]

## 2013-07-05 ENCOUNTER — Non-Acute Institutional Stay (SKILLED_NURSING_FACILITY): Payer: PRIVATE HEALTH INSURANCE | Admitting: Internal Medicine

## 2013-07-05 ENCOUNTER — Observation Stay (HOSPITAL_COMMUNITY): Payer: PRIVATE HEALTH INSURANCE

## 2013-07-05 ENCOUNTER — Inpatient Hospital Stay (HOSPITAL_COMMUNITY)
Admission: EM | Admit: 2013-07-05 | Discharge: 2013-07-12 | DRG: 070 | Disposition: A | Discharge status: Skilled Nursing Facility | Payer: PRIVATE HEALTH INSURANCE | Attending: Internal Medicine | Admitting: Internal Medicine

## 2013-07-05 ENCOUNTER — Emergency Department (HOSPITAL_COMMUNITY): Payer: PRIVATE HEALTH INSURANCE

## 2013-07-05 ENCOUNTER — Encounter: Payer: Self-pay | Admitting: Internal Medicine

## 2013-07-05 ENCOUNTER — Encounter (HOSPITAL_COMMUNITY): Payer: Self-pay | Admitting: Emergency Medicine

## 2013-07-05 DIAGNOSIS — N390 Urinary tract infection, site not specified: Secondary | ICD-10-CM | POA: Diagnosis present

## 2013-07-05 DIAGNOSIS — F411 Generalized anxiety disorder: Secondary | ICD-10-CM | POA: Diagnosis present

## 2013-07-05 DIAGNOSIS — Z923 Personal history of irradiation: Secondary | ICD-10-CM | POA: Diagnosis not present

## 2013-07-05 DIAGNOSIS — D61818 Other pancytopenia: Secondary | ICD-10-CM | POA: Diagnosis present

## 2013-07-05 DIAGNOSIS — R509 Fever, unspecified: Secondary | ICD-10-CM

## 2013-07-05 DIAGNOSIS — F29 Unspecified psychosis not due to a substance or known physiological condition: Secondary | ICD-10-CM | POA: Insufficient documentation

## 2013-07-05 DIAGNOSIS — R4182 Altered mental status, unspecified: Secondary | ICD-10-CM | POA: Diagnosis present

## 2013-07-05 DIAGNOSIS — IMO0002 Reserved for concepts with insufficient information to code with codable children: Secondary | ICD-10-CM

## 2013-07-05 DIAGNOSIS — N179 Acute kidney failure, unspecified: Secondary | ICD-10-CM | POA: Diagnosis present

## 2013-07-05 DIAGNOSIS — Z8673 Personal history of transient ischemic attack (TIA), and cerebral infarction without residual deficits: Secondary | ICD-10-CM | POA: Diagnosis not present

## 2013-07-05 DIAGNOSIS — C719 Malignant neoplasm of brain, unspecified: Secondary | ICD-10-CM

## 2013-07-05 DIAGNOSIS — N183 Chronic kidney disease, stage 3 unspecified: Secondary | ICD-10-CM

## 2013-07-05 DIAGNOSIS — G9389 Other specified disorders of brain: Secondary | ICD-10-CM | POA: Diagnosis present

## 2013-07-05 DIAGNOSIS — M62838 Other muscle spasm: Secondary | ICD-10-CM

## 2013-07-05 DIAGNOSIS — N189 Chronic kidney disease, unspecified: Secondary | ICD-10-CM

## 2013-07-05 DIAGNOSIS — Z803 Family history of malignant neoplasm of breast: Secondary | ICD-10-CM

## 2013-07-05 DIAGNOSIS — G40909 Epilepsy, unspecified, not intractable, without status epilepticus: Secondary | ICD-10-CM | POA: Diagnosis present

## 2013-07-05 DIAGNOSIS — F419 Anxiety disorder, unspecified: Secondary | ICD-10-CM

## 2013-07-05 DIAGNOSIS — R1310 Dysphagia, unspecified: Secondary | ICD-10-CM | POA: Diagnosis present

## 2013-07-05 DIAGNOSIS — Z7982 Long term (current) use of aspirin: Secondary | ICD-10-CM | POA: Diagnosis not present

## 2013-07-05 DIAGNOSIS — E43 Unspecified severe protein-calorie malnutrition: Secondary | ICD-10-CM | POA: Diagnosis present

## 2013-07-05 DIAGNOSIS — I609 Nontraumatic subarachnoid hemorrhage, unspecified: Secondary | ICD-10-CM

## 2013-07-05 DIAGNOSIS — Z79899 Other long term (current) drug therapy: Secondary | ICD-10-CM | POA: Diagnosis not present

## 2013-07-05 DIAGNOSIS — G9341 Metabolic encephalopathy: Principal | ICD-10-CM | POA: Diagnosis present

## 2013-07-05 DIAGNOSIS — F418 Other specified anxiety disorders: Secondary | ICD-10-CM

## 2013-07-05 DIAGNOSIS — G9340 Encephalopathy, unspecified: Secondary | ICD-10-CM

## 2013-07-05 DIAGNOSIS — R7989 Other specified abnormal findings of blood chemistry: Secondary | ICD-10-CM

## 2013-07-05 DIAGNOSIS — R748 Abnormal levels of other serum enzymes: Secondary | ICD-10-CM

## 2013-07-05 DIAGNOSIS — C713 Malignant neoplasm of parietal lobe: Secondary | ICD-10-CM | POA: Diagnosis present

## 2013-07-05 DIAGNOSIS — R451 Restlessness and agitation: Secondary | ICD-10-CM

## 2013-07-05 DIAGNOSIS — R259 Unspecified abnormal involuntary movements: Secondary | ICD-10-CM

## 2013-07-05 DIAGNOSIS — R569 Unspecified convulsions: Secondary | ICD-10-CM | POA: Diagnosis present

## 2013-07-05 DIAGNOSIS — I671 Cerebral aneurysm, nonruptured: Secondary | ICD-10-CM

## 2013-07-05 LAB — CBC WITH DIFFERENTIAL/PLATELET
BASOS ABS: 0 10*3/uL (ref 0.0–0.1)
Basophils Relative: 0 % (ref 0–1)
Eosinophils Absolute: 0.1 10*3/uL (ref 0.0–0.7)
Eosinophils Relative: 2 % (ref 0–5)
HCT: 27.1 % — ABNORMAL LOW (ref 36.0–46.0)
Hemoglobin: 9.7 g/dL — ABNORMAL LOW (ref 12.0–15.0)
LYMPHS ABS: 0.9 10*3/uL (ref 0.7–4.0)
LYMPHS PCT: 30 % (ref 12–46)
MCH: 31.9 pg (ref 26.0–34.0)
MCHC: 35.8 g/dL (ref 30.0–36.0)
MCV: 89.1 fL (ref 78.0–100.0)
Monocytes Absolute: 0.3 10*3/uL (ref 0.1–1.0)
Monocytes Relative: 10 % (ref 3–12)
NEUTROS ABS: 1.6 10*3/uL — AB (ref 1.7–7.7)
NEUTROS PCT: 58 % (ref 43–77)
PLATELETS: 120 10*3/uL — AB (ref 150–400)
RBC: 3.04 MIL/uL — AB (ref 3.87–5.11)
RDW: 14.4 % (ref 11.5–15.5)
WBC: 2.8 10*3/uL — AB (ref 4.0–10.5)

## 2013-07-05 LAB — BASIC METABOLIC PANEL
BUN: 19 mg/dL (ref 6–23)
CALCIUM: 9.8 mg/dL (ref 8.4–10.5)
CO2: 24 meq/L (ref 19–32)
Chloride: 107 mEq/L (ref 96–112)
Creatinine, Ser: 1.18 mg/dL — ABNORMAL HIGH (ref 0.50–1.10)
GFR, EST AFRICAN AMERICAN: 59 mL/min — AB (ref >90)
GFR, EST NON AFRICAN AMERICAN: 51 mL/min — AB (ref >90)
Glucose, Bld: 120 mg/dL — ABNORMAL HIGH (ref 70–99)
Potassium: 3.9 mEq/L (ref 3.7–5.3)
Sodium: 142 mEq/L (ref 137–147)

## 2013-07-05 LAB — URINALYSIS, ROUTINE W REFLEX MICROSCOPIC
Bilirubin Urine: NEGATIVE
GLUCOSE, UA: NEGATIVE mg/dL
Hgb urine dipstick: NEGATIVE
Ketones, ur: NEGATIVE mg/dL
LEUKOCYTES UA: NEGATIVE
Nitrite: NEGATIVE
PH: 7.5 (ref 5.0–8.0)
Protein, ur: NEGATIVE mg/dL
SPECIFIC GRAVITY, URINE: 1.01 (ref 1.005–1.030)
Urobilinogen, UA: 2 mg/dL — ABNORMAL HIGH (ref 0.0–1.0)

## 2013-07-05 LAB — AMMONIA: AMMONIA: 52 umol/L (ref 11–60)

## 2013-07-05 MED ORDER — ONDANSETRON HCL 4 MG/2ML IJ SOLN: 4.0000 mg | Freq: Four times a day (QID) | INTRAMUSCULAR | Status: DC | PRN

## 2013-07-05 MED ORDER — ONDANSETRON HCL 4 MG PO TABS: 4.0000 mg | ORAL_TABLET | Freq: Four times a day (QID) | ORAL | Status: DC | PRN

## 2013-07-05 MED ORDER — SODIUM CHLORIDE 0.9 % IJ SOLN
3.0000 mL | Freq: Two times a day (BID) | INTRAMUSCULAR | Status: DC
  Administered 2013-07-05 – 2013-07-11 (×5): 3 mL via INTRAVENOUS

## 2013-07-05 MED ORDER — SODIUM CHLORIDE 0.9 % IV SOLN
50.0000 mg | Freq: Two times a day (BID) | INTRAVENOUS | Status: DC
  Administered 2013-07-05 – 2013-07-10 (×9): 50 mg via INTRAVENOUS
  Filled 2013-07-05 (×14): qty 5

## 2013-07-05 MED ORDER — SODIUM CHLORIDE 0.9 % IV SOLN
500.0000 mg | Freq: Two times a day (BID) | INTRAVENOUS | Status: DC
  Administered 2013-07-05 – 2013-07-09 (×9): 500 mg via INTRAVENOUS
  Filled 2013-07-05 (×12): qty 5

## 2013-07-05 MED ORDER — SODIUM CHLORIDE 0.9 % IV SOLN
INTRAVENOUS | Status: DC
  Administered 2013-07-05 – 2013-07-08 (×5): via INTRAVENOUS

## 2013-07-05 MED ORDER — PANTOPRAZOLE SODIUM 40 MG IV SOLR
40.0000 mg | INTRAVENOUS | Status: DC
Start: 2013-07-05 — End: 2013-07-07
  Administered 2013-07-05 – 2013-07-07 (×3): 40 mg via INTRAVENOUS
  Filled 2013-07-05 (×3): qty 40

## 2013-07-05 MED ORDER — HALOPERIDOL LACTATE 5 MG/ML IJ SOLN
2.0000 mg | Freq: Four times a day (QID) | INTRAMUSCULAR | Status: DC | PRN
  Administered 2013-07-05: 1 mg via INTRAVENOUS
  Filled 2013-07-05: qty 1

## 2013-07-05 MED ORDER — SODIUM CHLORIDE 0.9 % IV SOLN
INTRAVENOUS | Status: AC
  Administered 2013-07-05: 18:00:00 via INTRAVENOUS

## 2013-07-05 MED ORDER — ASPIRIN 300 MG RE SUPP
300.0000 mg | Freq: Every day | RECTAL | Status: DC
  Administered 2013-07-05 – 2013-07-07 (×3): 300 mg via RECTAL
  Filled 2013-07-05 (×3): qty 1

## 2013-07-05 MED ORDER — ACETAMINOPHEN 325 MG PO TABS
650.0000 mg | ORAL_TABLET | Freq: Four times a day (QID) | ORAL | Status: DC | PRN
  Administered 2013-07-09 – 2013-07-11 (×2): 650 mg via ORAL
  Filled 2013-07-05 (×2): qty 2

## 2013-07-05 MED ORDER — ENOXAPARIN SODIUM 40 MG/0.4ML ~~LOC~~ SOLN
40.0000 mg | SUBCUTANEOUS | Status: DC
  Administered 2013-07-05 – 2013-07-08 (×4): 40 mg via SUBCUTANEOUS
  Filled 2013-07-05 (×5): qty 0.4

## 2013-07-05 MED ORDER — ACETAMINOPHEN 650 MG RE SUPP: 650.0000 mg | Freq: Four times a day (QID) | RECTAL | Status: DC | PRN

## 2013-07-05 NOTE — Progress Notes (Addendum)
CSW spoke with [name] DON at [facility] who states that patient does not have any psych history expcet for depression and anxiety. Per chart review, patietn was seen by psych and restarted on prozac for depression during 05/2013 hospitalization.  Per [name], patient neurologist recently decreased a medication and before other psychiatric medications were given, wanted patient to be medically evaluated further and requested a ct scan. Per [name], patietn decadron was discontinued due to lethargy, and also reduced keppra, prozac, and ritalin.   [name] [phone]  ED CSW 07/05/2013 1423pm

## 2013-07-05 NOTE — Assessment & Plan Note (Signed)
Would worry about extension

## 2013-07-05 NOTE — ED Notes (Signed)
Bed: WA07 Expected date:  Expected time:  Means of arrival:  Comments: Nursing home, behavioral issues

## 2013-07-05 NOTE — ED Notes (Signed)
Called [facility] and spoke to [place]. Pt is usually A&O and pleasant. For last 2 days pt has had paranoid delusions of being raped, people stealing from her, and people hiding evidence from her. Pt has been calling out and screaming, and refusing medications.

## 2013-07-05 NOTE — ED Provider Notes (Signed)
CSN: [id number]     Arrival date & time 07/05/13  1241 History   First MD Initiated Contact with Patient 07/05/13 1246     Chief Complaint  Patient presents with  . Aggressive Behavior   (Consider location/radiation/quality/duration/timing/severity/associated sxs/prior Treatment) HPI Comments: 53 yo female with brain tumor, radiation, malnutrition, SAH, seizure hx presents by EMS from NH with worsening mental status and aggressive behavior since yesterday. No witnessed head injuries.  No fevers.  Pt received haldol on route, difficult exam and pt one word answers for ROS/ HPI.  Worsening per EMS.   The history is provided by the nursing home, the patient and the EMS personnel.    Past Medical History  Diagnosis Date  . 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 Fracture, ankle 2012    right  . History of radiation therapy 09/23/10- 11/07/10    right parietal lobe  . Chronic headaches     uses oxycodone appropriately  . Anxiety   . Muscle spasticity   . Seizures   . Dysphagia    Past Surgical History  Procedure Laterality Date  . Orif ankle fracture  08/22/2010    right  . Radiology with anesthesia N/A 05/02/2013    Procedure: RADIOLOGY WITH ANESTHESIA;  Surgeon: Consuella Lose, MD;  Location: [facility];  Service: Radiology;  Laterality: N/A;  . Brain surgery Right 2012    astrocytoma R parietal lobe   Family History  Problem Relation Age of Onset  . Breast cancer Mother    History  Substance Use Topics  . Smoking status: Never Smoker   . Smokeless tobacco: Never Used  . Alcohol Use: 0.0 oz/week    3-4 Cans of beer per week   OB History   Grav Para Term Preterm Abortions TAB SAB Ect Mult Living                 Review of Systems  Unable to perform ROS: Mental status change    Allergies  Vasotec  Home Medications   Current Outpatient Rx  Name  Route  Sig  Dispense  Refill  . aspirin 325 MG EC tablet   Oral   Take 325 mg by mouth daily.          . clopidogrel (PLAVIX) 75 MG tablet   Oral   Take 75 mg by mouth daily with breakfast.         . food thickener (THICK IT) POWD   Oral   Take 1 g by mouth as needed (dietary use).         Marland Kitchen lacosamide (VIMPAT) 50 MG TABS tablet   Oral   Take 1 tablet (50 mg total) by mouth 2 (two) times daily.   60 tablet   5   . levETIRAcetam (KEPPRA) 500 MG tablet   Oral   Take 1 tablet (500 mg total) by mouth every 12 (twelve) hours.   60 tablet   5   . methylphenidate (RITALIN) 5 MG tablet      Take one tablet by mouth twice daily   60 tablet   0   . Multiple Vitamin (MULTIVITAMIN) tablet   Oral   Take 1 tablet by mouth daily.           . pantoprazole (PROTONIX) 40 MG tablet   Oral   Take 40 mg by mouth daily.          BP 127/76  Pulse 66  Temp(Src) 98.5  F (36.9 C) (Oral)  Resp 14  SpO2 98%  LMP 08/26/2010 Physical Exam  Nursing note and vitals reviewed. Constitutional: No distress.  HENT:  Head: Normocephalic and atraumatic.  Eyes: Conjunctivae are normal. Right eye exhibits no discharge. Left eye exhibits no discharge.  Neck: Normal range of motion. Neck supple. No tracheal deviation present.  Cardiovascular: Normal rate and regular rhythm.   Pulmonary/Chest: Effort normal and breath sounds normal.  Abdominal: Soft. She exhibits no distension. There is no tenderness. There is no guarding.  Musculoskeletal: She exhibits no edema.  Neurological: GCS eye subscore is 2. GCS verbal subscore is 4. GCS motor subscore is 5.  Pt sleeping, arousable to loud verbal, moans and says few word sentences ie. No or leave me alone Legs flexed bilateral, arms flexed bilateral, grossly equal movement bilateral which is minimal horiz eye movements intact No obvious facial droop Difficult exam  Skin: Skin is warm. No rash noted. She is not diaphoretic.    ED Course  Procedures (including critical care time) Labs Review Labs Reviewed  BASIC METABOLIC PANEL - Abnormal;  Notable for the following:    Glucose, Bld 120 (*)    Creatinine, Ser 1.18 (*)    GFR calc non Af Amer 51 (*)    GFR calc Af Amer 59 (*)    All other components within normal limits  CBC WITH DIFFERENTIAL - Abnormal; Notable for the following:    WBC 2.8 (*)    RBC 3.04 (*)    Hemoglobin 9.7 (*)    HCT 27.1 (*)    Platelets 120 (*)    Neutro Abs 1.6 (*)    All other components within normal limits  URINALYSIS, ROUTINE W REFLEX MICROSCOPIC - Abnormal; Notable for the following:    Urobilinogen, UA 2.0 (*)    All other components within normal limits  AMMONIA   Imaging Review Ct Head Wo Contrast  07/05/2013   CLINICAL DATA:  Delusions an aggressive behavior. Recent parenchymal intracranial hemorrhage  EXAM: CT HEAD WITHOUT CONTRAST  TECHNIQUE: Contiguous axial images were obtained from the base of the skull through the vertex without intravenous contrast.  COMPARISON:  MRI brain 05/25/2013  FINDINGS: A 4 cm cystic lesion within the right parietal lobe again demonstrates a fluid level. Surrounding encephalomalacia in the right parietal and temporal lobe is stable. Infarcted territory is more anteriorly in the right frontal lobe are also stable. Diffuse white matter changes similar prior exam. Ex vacuo dilation of the right lateral ventricle is noted. A layering extra-axial collection is similar to the prior exam.  No new or expanding hemorrhage is present. Mild periventricular white matter changes are evident, as before. No significant extra-axial fluid collection is present on the left. A right-sided pipeline stent is in place.  The right parietal craniotomy is again noted. The paranasal sinuses and mastoid air cells are clear. The osseous skull is otherwise intact.  IMPRESSION: 1. No significant interval change. 2. Stable cystic lesion with a hematocrit level in the right parietal lobe. 3. Stable encephalomalacia of the right frontal and temporal lobes. 4. Stable asymmetric white matter disease  with ex vacuo dilation of the right lateral ventricle. 5. Right ICA stenting. 6. Stable layering right extra-axial fluid collection without evidence for acute hemorrhage or expansion.   Electronically Signed   By: Lawrence Santiago M.D.   On: 07/05/2013 13:31   Dg Chest Port 1 View  07/05/2013   CLINICAL DATA:  Patient unresponsive  EXAM: PORTABLE CHEST -  1 VIEW  COMPARISON:  05/24/2013  FINDINGS: The heart size and mediastinal contours are within normal limits. Both lungs are clear. The visualized skeletal structures are unremarkable.  IMPRESSION: No active disease.   Electronically Signed   By: Skipper Cliche M.D.   On: 07/05/2013 13:57    EKG Interpretation   None       MDM   1. Altered mental state   2. Agitation   3. CRF (chronic renal failure)    Broad differential for AMS.   Agitation improved in ED post haldol. CT head, blood work. No acute findings on eval in ED.   Mild improvement on recheck, pt still general altered, difficult neuro exam, pt pushes me away on exam, mild agitation. Neck supple, very low concern for meningitis at this time and with haldol recently given discussed with Dr Cydney Ok to Place in observation and monitor closely/ further eval. Will hold LP and medicine will reconsider if no improvement inpt.  Filed Vitals:   07/05/13 1243 07/05/13 1253 07/05/13 1551 07/05/13 1604  BP: 127/76 127/76 139/60   Pulse: 67 66 67   Temp:  98.5 F (36.9 C)  98.6 F (37 C)  TempSrc:  Oral  Rectal  Resp: 16 14 18    SpO2: 100% 98% 100%     The patients results and plan were reviewed and discussed.   Any x-rays performed were personally reviewed by myself.   Differential diagnosis were considered with the presenting HPI.    Admission/ observation were discussed with the admitting physician.      Mariea Clonts, MD 07/05/13 [id number]

## 2013-07-05 NOTE — ED Notes (Signed)
Unsuccessfully attempted to obtain blood for labs.RN Catalina Antigua made aware

## 2013-07-05 NOTE — Assessment & Plan Note (Addendum)
Extension possible; pt is not however obtunded in any way.

## 2013-07-05 NOTE — H&P (Signed)
[facility] History and Physical  [name]:[id number] DOB: [birth year] DOA: 07/05/2013  Referring physician: ED PCP: Hollace Kinnier, DO   Chief Complaint:  Sent from NH for agitation and acute change in mental status  HPI:   History obtained from patient's Nurse Nira Conn at [facility] and ED physician.  53 year old female with astrocytoma of the right parietal lobe s/p resection and radiation therapy in 2012, anxiety, seizure disorder, ACTH, history of cerebral aneurysm (unruptured), encephalomalacia with progressive cognitive decline who is a resident of [facility] living at [facility] was sent to the ED with symptoms of acute change in mental status with aggressive behavior past 2 days. Patient was admitted to the hospital 6 weeks back for encephalopathy with increased lethargy and had a negative CVA workup and EEG. She was started on Keppra and vimpat for seizures as per neurology. She was also seen by psychiatry for severe depression and started on antidepressants. Patient was admitted prior to that in November 2014 for [facility]  with findings of right posterior temporal stroke and had pipeline embolization of the RICA aneurysm. The patient at baseline  is pleasantly confused and is oriented mainly  to person and is able to carry out simple conversation.. Patient  for last 2 days has been increasingly anxious, agitated, yelling at staffs, having delusions ( saying people were stealing things from her room, cutting her and raping her), yelling that everyone's conspiring and covering up the evidence. Patient did not have any fever, chills, SOB, nausea, vomiting, or witnessed seizures, lethargy or loss of consciousness. No history of fall, recent illness or change in her medications. Patient  at baseline is incontinent of urine and bowel.  She was given 5 mg IM haldol then went off to sleep.  Patient at baseline was alert but pleasantly confused, some hand tremors. She requires full support with her  ADLs. Patient is sleepy and poorly arousable and yells in between when trying to wake her up.   In the ED her vitals were stable. Blood work showed mild pancytopenia with WBC of 2.8, hemoglobin of 9.7, platelets of 120. Chemistry was normal except for creatinine of 1.18. Blood glucose was 120. A chest x-ray done was unremarkable. Head CT done and showed stable encephalomalacia of the right frontal and temporal lobes, right ICA stenting and no acute findings.  Review of Systems:  As outlined in history of present illness. Patient unable to provide any history  Past Medical History  Diagnosis Date  . 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 Fracture, ankle 2012    right  . History of radiation therapy 09/23/10- 11/07/10    right parietal lobe  . Chronic headaches     uses oxycodone appropriately  . Anxiety   . Muscle spasticity   . Seizures   . Dysphagia    Past Surgical History  Procedure Laterality Date  . Orif ankle fracture  08/22/2010    right  . Radiology with anesthesia N/A 05/02/2013    Procedure: RADIOLOGY WITH ANESTHESIA;  Surgeon: Consuella Lose, MD;  Location: [place];  Service: Radiology;  Laterality: N/A;  . Brain surgery Right 2012    astrocytoma R parietal lobe   Social History:  reports that she has never smoked. She has never used smokeless tobacco. She reports that she drinks alcohol. She reports that she does not use illicit drugs.  Allergies  Allergen Reactions  . Vasotec Other (See Comments)    Causes angioedema  Family History  Problem Relation Age of Onset  . Breast cancer Mother     Prior to Admission medications   Medication Sig Start Date End Date Taking? Authorizing Provider  aspirin 325 MG EC tablet Take 325 mg by mouth daily.   Yes Historical Provider, MD  clopidogrel (PLAVIX) 75 MG tablet Take 75 mg by mouth daily with breakfast.   Yes Historical Provider, MD  food thickener (THICK IT) POWD Take 1 g by mouth as needed  (dietary use).   Yes Historical Provider, MD  lacosamide (VIMPAT) 50 MG TABS tablet Take 1 tablet (50 mg total) by mouth 2 (two) times daily. 05/31/13  Yes Estill Dooms, MD  levETIRAcetam (KEPPRA) 500 MG tablet Take 1 tablet (500 mg total) by mouth every 12 (twelve) hours. 01/25/13  Yes Chauncey Cruel, MD  methylphenidate (RITALIN) 5 MG tablet Take one tablet by mouth twice daily 05/31/13  Yes Estill Dooms, MD  Multiple Vitamin (MULTIVITAMIN) tablet Take 1 tablet by mouth daily.     Yes Historical Provider, MD  pantoprazole (PROTONIX) 40 MG tablet Take 40 mg by mouth daily.   Yes Historical Provider, MD    Physical Exam:  Filed Vitals:   07/05/13 1243 07/05/13 1253 07/05/13 1551 07/05/13 1604  BP: 127/76 127/76 139/60   Pulse: 67 66 67   Temp:  98.5 F (36.9 C)  98.6 F (37 C)  TempSrc:  Oral  Rectal  Resp: 16 14 18    SpO2: 100% 98% 100%     Constitutional: Vital signs reviewed.  Middle aged female lying in bed poorly groomed, sleepy and poorly arousable to commands. He does yells out inconsistently on trying to wake her up  HEENT: Pupils reactive bilaterally, no pallor, poor dentition, dry oral mucosa Chest: Clear to auscultation bilaterally, no added sounds Cardiovascular: RRR, S1 normal, S2 normal, no MRG,  Abdominal: Soft. Non-tender, non-distended, bowel sounds are normal, Extremities: Warm, no edema CNS: Sleepy and poorly arousable, moving all extremities Labs on Admission:  Basic Metabolic Panel:  Recent Labs Lab 07/05/13 1310  NA 142  K 3.9  CL 107  CO2 24  GLUCOSE 120*  BUN 19  CREATININE 1.18*  CALCIUM 9.8   Liver Function Tests: No results found for this basename: AST, ALT, ALKPHOS, BILITOT, PROT, ALBUMIN,  in the last 168 hours No results found for this basename: LIPASE, AMYLASE,  in the last 168 hours  Recent Labs Lab 07/05/13 1519  AMMONIA 52   CBC:  Recent Labs Lab 07/05/13 1310  WBC 2.8*  NEUTROABS 1.6*  HGB 9.7*  HCT 27.1*  MCV 89.1   PLT 120*   Cardiac Enzymes: No results found for this basename: CKTOTAL, CKMB, CKMBINDEX, TROPONINI,  in the last 168 hours BNP: No components found with this basename: POCBNP,  CBG: No results found for this basename: GLUCAP,  in the last 168 hours  Radiological Exams on Admission: Ct Head Wo Contrast  07/05/2013   CLINICAL DATA:  Delusions an aggressive behavior. Recent parenchymal intracranial hemorrhage  EXAM: CT HEAD WITHOUT CONTRAST  TECHNIQUE: Contiguous axial images were obtained from the base of the skull through the vertex without intravenous contrast.  COMPARISON:  MRI brain 05/25/2013  FINDINGS: A 4 cm cystic lesion within the right parietal lobe again demonstrates a fluid level. Surrounding encephalomalacia in the right parietal and temporal lobe is stable. Infarcted territory is more anteriorly in the right frontal lobe are also stable. Diffuse white matter changes similar prior exam. Ex  vacuo dilation of the right lateral ventricle is noted. A layering extra-axial collection is similar to the prior exam.  No new or expanding hemorrhage is present. Mild periventricular white matter changes are evident, as before. No significant extra-axial fluid collection is present on the left. A right-sided pipeline stent is in place.  The right parietal craniotomy is again noted. The paranasal sinuses and mastoid air cells are clear. The osseous skull is otherwise intact.  IMPRESSION: 1. No significant interval change. 2. Stable cystic lesion with a hematocrit level in the right parietal lobe. 3. Stable encephalomalacia of the right frontal and temporal lobes. 4. Stable asymmetric white matter disease with ex vacuo dilation of the right lateral ventricle. 5. Right ICA stenting. 6. Stable layering right extra-axial fluid collection without evidence for acute hemorrhage or expansion.   Electronically Signed   By: Lawrence Santiago M.D.   On: 07/05/2013 13:31   Dg Chest Port 1 View  07/05/2013   CLINICAL  DATA:  Patient unresponsive  EXAM: PORTABLE CHEST - 1 VIEW  COMPARISON:  05/24/2013  FINDINGS: The heart size and mediastinal contours are within normal limits. Both lungs are clear. The visualized skeletal structures are unremarkable.  IMPRESSION: No active disease.   Electronically Signed   By: Skipper Cliche M.D.   On: 07/05/2013 13:57    EKG: pending  Assessment/Plan   Principal Problem:   Acute encephalopathy Likely metabolic encephalopathy with acute psychosis due to worsening encephalomalacia,  Delirium with progressive dementia,CVA or  acute viral viral encephalopathy. seizure appears to be  unlikely based on history. Patient has been afebrile and normal neurological deficit has been noted. No signs of infection. UA and chest x-ray unremarkable. Admit to telemetry. Frequent neuro checks.  Keep patient n.p.o and order IV hydration. -Will place her on when necessary IV Haldol 2 mg every 6 hours for agitation. Psych consult called for evaluation. -Head CT unremarkable.  Will get an MRI of her brain. -No seizure-like activity noted. Continue on Keppra and Vimpat. EEG done 6 weeks back was unremarkable. His neurological function unimproved or worsen over next 24-48 hours and has normal MRI brain she will need repeat EEG and a lumbar puncture. -Check HSV ag. Order blood culture.   Active Problems:  History of CVA and SAH Continue aspirin suppository. According Plavix as patient is n.p.o.    Astrocytoma brain tumor Status post surgical resection and radiation therapy in 2012    Protein-calorie malnutrition, severe Currently n.p.o.    Seizures Continue Keppra and vimpat ( switch to IV) Will obtain EEGF if symptoms unimproved  Pancytopenia Appears to be new and possibly in the setting of underlying viral infection. Monitor closely    CKD (chronic kidney disease) stage 3, GFR 30-59 ml/min Seems at baseline. continue IV hydration  Diet: N.p.o. DVT prophylaxis: Sq  lovenox  Code Status: Full code Family Communication: husband Tim updated on the phone Disposition Plan: return to SNF  Clementeen Graham [facility] Pager [phone]  If 7PM-7AM, please contact night-coverage [email or web address] Password [email or web address] 07/05/2013, 5:03 PM   Total time spent:50 minutes

## 2013-07-05 NOTE — ED Notes (Signed)
Lab drew ammonia level

## 2013-07-05 NOTE — Progress Notes (Signed)
MRN: [id number] Name: [name]  Sex: female Age: 53 y.o. DOB: 04/20/1961  [place] #: [id number] Facility/Room:  123A Level Of Care: SNF Provider: Inocencio Homes D Emergency Contacts: Extended Emergency Contact Information Primary Emergency Contact: Frady,Tim Address: [address]          [place], [place] [address] [place] Phone: [phone] Mobile Phone: [phone] Relation: Spouse Secondary Emergency Contact: Kimberlee Nearing [place] Mobile Phone: [phone] Relation: Father  Code Status: FULL  Allergies: Vasotec  Chief Complaint  Patient presents with  . Acute Visit    HPI: Patient is 53 y.o. female who has become acutely delusional, paranoid and afraid with behaivoir problems. Onset last night, then continued this am. Pt will not allow care or medications. She is screaming in fear.  Past Medical History  Diagnosis Date  . 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 Fracture, ankle 2012    right  . History of radiation therapy 09/23/10- 11/07/10    right parietal lobe  . Chronic headaches     uses oxycodone appropriately  . Anxiety   . Muscle spasticity   . Seizures   . Dysphagia     Past Surgical History  Procedure Laterality Date  . Orif ankle fracture  08/22/2010    right  . Radiology with anesthesia N/A 05/02/2013    Procedure: RADIOLOGY WITH ANESTHESIA;  Surgeon: Consuella Lose, MD;  Location: [facility];  Service: Radiology;  Laterality: N/A;  . Brain surgery Right 2012    astrocytoma R parietal lobe      Medication List    Notice   Cannot display patient medications because the patient has not yet arrived.      No orders of the defined types were placed in this encounter.    Immunization History  Administered Date(s) Administered  . Influenza,inj,Quad PF,36+ Mos 05/26/2013  . Pneumococcal Polysaccharide-23 05/26/2013  . Tdap 08/30/2011    History  Substance Use Topics  . Smoking  status: Never Smoker   . Smokeless tobacco: Never Used  . Alcohol Use: 0.0 oz/week    3-4 Cans of beer per week    Review of Systems   UTO sec to pt'd mental state. She thinks I am speaking to her in order to kill her.   Filed Vitals:   07/05/13 1738  BP: 129/72  Pulse: 68  Temp: 98.9 F (37.2 C)  Resp: 22    Physical Exam  GENERAL APPEARANCE: Alert, conversant. Appropriately groomed. Wild eyed, screaming at times SKIN: No diaphoresis rash HEENT: Unremarkable RESPIRATORY: Breathing is even, unlabored. Lung sounds are clear  Mild inc in RR CARDIOVASCULAR: Heart RRR no murmurs, rubs or gallops. No peripheral edema  GASTROINTESTINAL: Abdomen is soft, non-tender, not distended w/ normal bowel sounds.  GENITOURINARY: Bladder non tender, not distended  MUSCULOSKELETAL: mild contractures NEUROLOGIC: Cranial nerves 2-12 grossly intact. Moves all extremities no tremor. PSYCHIATRIC: delusional, paranoid  Patient Active Problem List   Diagnosis Date Noted  . Altered mental state 07/05/2013  . Acute encephalopathy 07/05/2013  . Pancytopenia 07/05/2013  . CKD (chronic kidney disease) stage 3, GFR 30-59 ml/min 07/05/2013  . Psychosis 07/05/2013  . Involuntary movements 06/21/2013  . Dysphagia   . Depression with anxiety 05/31/2013  . Elevated alkaline phosphatase level 05/25/2013  . Prerenal azotemia 05/25/2013  . Mental status change 05/25/2013  . Anxiety   . Seizures   . Muscle spasticity   . Subarachnoid hemorrhage 05/05/2013  .  SAH (subarachnoid hemorrhage) 04/21/2013  . Cerebral aneurysm, nonruptured 04/21/2013  . Protein-calorie malnutrition, severe 04/19/2013  . History of radiation therapy   . 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    CBC    Component Value Date/Time   WBC 2.8* 07/05/2013 1310   WBC 3.3* 12/07/2012 1552   RBC 3.04* 07/05/2013 1310   RBC 3.72 12/07/2012 1552   HGB 9.7* 07/05/2013 1310   HGB 11.9 12/07/2012 1552   HCT 27.1* 07/05/2013 1310   HCT 34.6* 12/07/2012  1552   PLT 120* 07/05/2013 1310   PLT 97* 12/07/2012 1552   MCV 89.1 07/05/2013 1310   MCV 92.9 12/07/2012 1552   LYMPHSABS 0.9 07/05/2013 1310   LYMPHSABS 0.7* 12/07/2012 1552   MONOABS 0.3 07/05/2013 1310   MONOABS 0.3 12/07/2012 1552   EOSABS 0.1 07/05/2013 1310   EOSABS 0.1 12/07/2012 1552   BASOSABS 0.0 07/05/2013 1310   BASOSABS 0.0 12/07/2012 1552    CMP     Component Value Date/Time   NA 142 07/05/2013 1310   NA 144 12/07/2012 1552   K 3.9 07/05/2013 1310   K 4.0 12/07/2012 1552   CL 107 07/05/2013 1310   CL 105 08/31/2012 1523   CO2 24 07/05/2013 1310   CO2 31* 12/07/2012 1552   GLUCOSE 120* 07/05/2013 1310   GLUCOSE 112 12/07/2012 1552   GLUCOSE 97 08/31/2012 1523   BUN 19 07/05/2013 1310   BUN 12.2 12/07/2012 1552   CREATININE 1.18* 07/05/2013 1310   CREATININE 1.0 12/07/2012 1552   CALCIUM 9.8 07/05/2013 1310   CALCIUM 10.4 12/07/2012 1552   PROT 5.5* 05/29/2013 0728   PROT 6.5 12/07/2012 1552   ALBUMIN 2.7* 05/29/2013 0728   ALBUMIN 3.2* 12/07/2012 1552   AST 30 05/29/2013 0728   AST 56* 12/07/2012 1552   ALT 34 05/29/2013 0728   ALT 27 12/07/2012 1552   ALKPHOS 119* 05/29/2013 0728   ALKPHOS 127 12/07/2012 1552   BILITOT 0.5 05/29/2013 0728   BILITOT 1.08 12/07/2012 1552   GFRNONAA 51* 07/05/2013 1310   GFRAA 59* 07/05/2013 1310    Assessment and Plan  Psychosis This is a new problem, not like her hallucinations. Pt is paranoid, delusional and afraid that the staff, nurses, me are trying to kill her, rape her, hurt her. Onset yesterday all day and again this am. Pt has her meds changed on 1/23 per rehab at [facility] but temporally that doesn't fit. She has had moments of uncontrolled behaivoir. SNF is not allowed to hold pt down for safety or meds so we are limited in interventions. If this behaivoir does not represent extension of tumor or other intracranial process or metabolic syndrome she will need psychiatry for antipsychotics. Psych won't see her until she is medically cleared so unfortunately the ED is the only place  for her to go. She did allow a shot which was Haldol 5 mg IM, the most we can give at SNF, and she was more manageable after that but of course we can not continue that for any length of time.  Astrocytoma brain tumor Would worry about extension   SAH (subarachnoid hemorrhage) Extension possible; pt is not however obtunded in any way.   Unstable pt;time spent at least 35 min  Hennie Duos, MD
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
## 2013-07-05 NOTE — ED Notes (Addendum)
Per EMS, pt is from [facility]. EMS report pt has been having delusions and aggressive behavior since yesterday and wanted to have her psychiatrically evaluated. Pt has hx of subarachnoid hemorrhage. Pt was given 5mg  Haldol at approximately 1145 today by nursing home staff. EMS reports pt was agitated en route to hospital. Pt is now sleeping.

## 2013-07-05 NOTE — Assessment & Plan Note (Signed)
This is a new problem, not like her hallucinations. Pt is paranoid, delusional and afraid that the staff, nurses, me are trying to kill her, rape her, hurt her. Onset yesterday all day and again this am. Pt has her meds changed on 1/23 per rehab at [facility] but temporally that doesn't fit. She has had moments of uncontrolled behaivoir. SNF is not allowed to hold pt down for safety or meds so we are limited in interventions. If this behaivoir does not represent extension of tumor or other intracranial process or metabolic syndrome she will need psychiatry for antipsychotics. Psych won't see her until she is medically cleared so unfortunately the ED is the only place for her to go. She did allow a shot which was Haldol 5 mg IM, the most we can give at SNF, and she was more manageable after that but of course we can not continue that for any length of time.

## 2013-07-05 NOTE — Progress Notes (Signed)
   CARE MANAGEMENT ED NOTE 07/05/2013  Patient:  [name], [name]   Account Number:  [id number]  Date Initiated:  07/05/2013  Documentation initiated by:  Jackelyn Poling  Subjective/Objective Assessment:   53 yr old medcost [facility]  [place] c/o aggressive behavior and delusions VSS Wbc 2.8 creat 1.18 hgb 9.7 chest xray without active disease CT head shows stable sytic lesion with a hemacrit level in right parietal lobe     Subjective/Objective Assessment Detail:   Stable layering right extra -axial fluid collection without evidence for acute hemorrhage or expansion  pcp listed as carol westermann but on snf sheets sent to ED with pt 07/05/13 Hollace Kinnier is listed as physician     Action/Plan:   Reviewed EPIC notes, labs Spoke with ED SW Refer to ED Sw notes indicating snf changes in pt medications EPIC updated   Action/Plan Detail:   Anticipated DC Date:  07/05/2013     Status Recommendation to Physician:   Result of Recommendation:    Other ED Services  Consult [facility] [place]  Other  Outpatient Services - Pt will follow up  PCP issues    Choice offered to / List presented to:            Status of service:  Completed, signed off  ED Comments:   ED Comments Detail:

## 2013-07-06 DIAGNOSIS — F0630 Mood disorder due to known physiological condition, unspecified: Secondary | ICD-10-CM

## 2013-07-06 DIAGNOSIS — N183 Chronic kidney disease, stage 3 unspecified: Secondary | ICD-10-CM

## 2013-07-06 DIAGNOSIS — F341 Dysthymic disorder: Secondary | ICD-10-CM

## 2013-07-06 DIAGNOSIS — D61818 Other pancytopenia: Secondary | ICD-10-CM

## 2013-07-06 DIAGNOSIS — F29 Unspecified psychosis not due to a substance or known physiological condition: Secondary | ICD-10-CM

## 2013-07-06 DIAGNOSIS — G9340 Encephalopathy, unspecified: Secondary | ICD-10-CM

## 2013-07-06 DIAGNOSIS — R4182 Altered mental status, unspecified: Secondary | ICD-10-CM
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
## 2013-07-06 NOTE — Progress Notes (Signed)
[facility] PROGRESS NOTE  [name] [id number] DOB: [birth year] DOA: 07/05/2013 PCP: No primary provider on file.  Assessment/Plan:   AMS/acute encephalopathy -MRI with sequela of right MCA infarct noted but no acute/new infarct -Will obtain EEG, UA unremarkable and chest x-ray of breath no acute infiltrates -Appreciate psych input, will follow and start by mouth Haldol once more alert and able to tolerate by mouth -Ammonia level within normal limits History of CVA and SAH Continue aspirin suppository. According Plavix as patient is n.p.o.  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  Seizures  Continue Keppra and vimpat ( switch to IV)  obtain EEG as above and follow Pancytopenia  -Unclear etiology, follow recheck. No gross bleeding CKD (chronic kidney disease) stage 3, GFR 30-59 ml/min  Seems at baseline. continue IV hydration   Code Status: Full Family Communication: Father by phone [phone] Disposition Plan: Pending clinical course   Consultants:  Psychiatry  Procedures:  None  Antibiotics:  None   HPI/Subjective: Patient sleeping but easily aroused, states 'does not know' in answer to all questions regarding orientation.  Objective: Filed Vitals:   07/06/13 1320  BP: 132/80  Pulse: 84  Temp: 98.9 F (37.2 C)  Resp: 17    Intake/Output Summary (Last 24 hours) at 07/06/13 1834 Last data filed at 07/06/13 1600  Gross per 24 hour  Intake 1452.5 ml  Output      0 ml  Net 1452.5 ml   Filed Weights   07/05/13 1840  Weight: 59.2 kg (130 lb 8.2 oz)    Exam:  General: Repeat but easily aroused, disoriented In NAD Cardiovascular: RRR, nl S1 s2 Respiratory: CTAB Abdomen: soft +BS NT/ND, no masses palpable Extremities: No cyanosis and no edema     Data Reviewed: Basic Metabolic Panel:  Recent Labs Lab 07/05/13 1310  NA 142  K  3.9  CL 107  CO2 24  GLUCOSE 120*  BUN 19  CREATININE 1.18*  CALCIUM 9.8   Liver Function Tests: No results found for this basename: AST, ALT, ALKPHOS, BILITOT, PROT, ALBUMIN,  in the last 168 hours No results found for this basename: LIPASE, AMYLASE,  in the last 168 hours  Recent Labs Lab 07/05/13 1519  AMMONIA 52   CBC:  Recent Labs Lab 07/05/13 1310  WBC 2.8*  NEUTROABS 1.6*  HGB 9.7*  HCT 27.1*  MCV 89.1  PLT 120*   Cardiac Enzymes: No results found for this basename: CKTOTAL, CKMB, CKMBINDEX, TROPONINI,  in the last 168 hours BNP (last 3 results)  Recent Labs  05/25/13 0320  PROBNP 212.1*   CBG: No results found for this basename: GLUCAP,  in the last 168 hours  No results found for this or any previous visit (from the past 240 hour(s)).   Studies: Ct Head [name] Contrast  07/05/2013   CLINICAL DATA:  Delusions an aggressive behavior. Recent parenchymal intracranial hemorrhage  EXAM: CT HEAD WITHOUT CONTRAST  TECHNIQUE: Contiguous axial images were obtained from the base of the skull through the vertex without intravenous contrast.  COMPARISON:  MRI Carla 05/25/2013  FINDINGS: A 4 cm cystic lesion within the right parietal lobe again demonstrates a fluid level. Surrounding encephalomalacia in the right parietal and temporal lobe is stable. Infarcted territory is more anteriorly in the right frontal lobe are also stable. Diffuse white matter changes similar prior exam. Ex vacuo dilation of the right lateral ventricle is noted. A  layering extra-axial collection is similar to the prior exam.  No new or expanding hemorrhage is present. Mild periventricular white matter changes are evident, as before. No significant extra-axial fluid collection is present on the left. A right-sided pipeline stent is in place.  The right parietal craniotomy is again noted. The paranasal sinuses and mastoid air cells are clear. The osseous skull is otherwise intact.  IMPRESSION: 1. No  significant interval change. 2. Stable cystic lesion with a hematocrit level in the right parietal lobe. 3. Stable encephalomalacia of the right frontal and temporal lobes. 4. Stable asymmetric white matter disease with ex vacuo dilation of the right lateral ventricle. 5. Right ICA stenting. 6. Stable layering right extra-axial fluid collection without evidence for acute hemorrhage or expansion.   Electronically Signed   By: Lawrence Santiago M.D.   On: 07/05/2013 13:31   Mr [name] [name] Contrast  07/05/2013   CLINICAL DATA:  53 year old female with altered mental status, confusion, agitation, acute encephalopathy. Initial encounter. History of astrocytoma resection and radiation therapy several years ago. More recently, the patient suffered right MCA infarct, and right subdural and subarachnoid hemorrhage (November 2014). The patient also underwent intracranial right ICA aneurysm endovascular treatment (pipeline stent) in December 2014.  EXAM: MRI HEAD WITHOUT CONTRAST  MRA HEAD WITHOUT CONTRAST  TECHNIQUE: Multiplanar, multiecho pulse sequences of the Carla and surrounding structures were obtained without intravenous contrast. Angiographic images of the head were obtained using MRA technique without contrast.  COMPARISON:  Head CT 07/05/2013.  Carla MRI 05/25/2013 and earlier.  FINDINGS: MRI HEAD FINDINGS  Major intracranial vascular flow voids are stable. Mild susceptibility artifact related to the supra clinoid right ICA stent. Persistent restricted diffusion along the gyrus superior to the chronic post treatment cavity in the posterior right hemisphere. The configuration of this diffusion abnormality is unchanged since 05/25/2013.  No associated leftward midline shift. Mildly increased ex vacuo enlargement of the ventricles since December.  Confluent abnormal FLAIR and T2 signal in the right hemisphere is stable since December. Superimposed evolving encephalomalacia in the right MCA territory with intrinsic T1  laminar necrosis intermittently noted. Stable small residual right side extra-axial collection. FLAIR signal in the high left parietal lobe has increased since the prior studies (series 7, image 19 today versus series 7, image 21 on 05/25/2013, series 11, image 20 on 04/1913). Diffusion here is facilitated. This is not contiguous with the abnormal right hemisphere signal; the corpus callosum signal remains normal.  The cystic resection cavity is stable in size and configuration since December. There is slightly less layering dark T2 fluid within the cavity (series 6, image 17). Decreased T2 * signal in keeping with hemosiderin is re- identified along the right hemisphere sulci, and along the margins of the resection cavity, and stable.  Negative pituitary, cervicomedullary junction and visualized cervical spine. Bone marrow signal is stable and within normal limits. Stable orbits soft tissues. Stable paranasal sinuses and mastoids, fairly well pneumatized. Visible internal auditory structures appear normal. No acute scalp soft tissue findings.  MRA HEAD FINDINGS  Improved antegrade flow in the posterior circulation since 04/20/13, resolved midbasilar vasospasm. Codominant distal vertebral arteries remain patent. Patent vertebrobasilar junction. Patent AICA vessels. No basilar stenosis. SCA and PCA origins are within normal limits. Significantly improved bilateral PCA branches, now appearing normal. Posterior communicating arteries are diminutive or absent.  Stable antegrade flow in both ICA siphons. On the left there is no ICA stenosis. The left ICA terminus, MCA and ACA origins are normal.  On the right there is persistent persistent increased MRA signal in the bilobed right ICA terminus aneurysm (series 9 images 90-95, and series 903, image 13). However, the aneurysm also demonstrates intrinsic T1 signal on axial series 11 (images 17 and 18). The size of each aneurysm lobe has mildly decreased since 04/20/13 (4 mm  anterior lobe, 6-7 mm posterior lobe - versus 6 and 8 mm respectively).  The right ICA terminus remains patent. The right MCA and ACA origins remain patent.  Anterior communicating artery and visualized ACA branches demonstrate improved appearance since November. Bilateral MCA branches demonstrate improved appearance since November. No major MCA or ACA branch occlusion is identified.  IMPRESSION: HEAD MRI IMPRESSION:  1. Sequelae of right MCA infarct, with persistent but unchanged diffusion abnormality in the gyrus superior to the chronic right hemisphere treatment cavity. Significance is unclear, but favor this is sequelae of the November ischemia, rather than new infarct. 2. 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 3. No significant change in the right hemisphere resection cavity itself, and the surrounding right hemisphere T2 and FLAIR hyperintensity is stable. 4. There is increased left parietal lobe white matter T2 and FLAIR hyperintensity, the etiology and significance unknown. The corpus callosum signal remains normal. HEAD MRA IMPRESSION:  1. Persistent signal in the lobulated right ICA terminus aneurysm, but most likely representing stasis/thrombosis within the aneurysm rather than persistent flow (discussed above). Overall aneurysm size has mildly diminished compared to the pre treatment study. 2. Otherwise significantly improved appearance of major intracranial arteries compared to the November study, at which time severe intracranial vasospasm was noted. MRA findings discussed by telephone with Dr. Kathyrn Sheriff (Neurosurgery) on 07/05/2013 at 21:26 .   Electronically Signed   By: Lars Pinks M.D.   On: 07/05/2013 21:32   Mr [name] Contrast  07/05/2013   CLINICAL DATA:  53 year old female with altered mental status, confusion, agitation, acute encephalopathy. Initial encounter. History of  astrocytoma resection and radiation therapy several years ago. More recently, the patient suffered right MCA infarct, and right subdural and subarachnoid hemorrhage (November 2014). The patient also underwent intracranial right ICA aneurysm endovascular treatment (pipeline stent) in December 2014.  EXAM: MRI HEAD WITHOUT CONTRAST  MRA HEAD WITHOUT CONTRAST  TECHNIQUE: Multiplanar, multiecho pulse sequences of the Carla and surrounding structures were obtained without intravenous contrast. Angiographic images of the head were obtained using MRA technique without contrast.  COMPARISON:  Head CT 07/05/2013.  Carla MRI 05/25/2013 and earlier.  FINDINGS: MRI HEAD FINDINGS  Major intracranial vascular flow voids are stable. Mild susceptibility artifact related to the supra clinoid right ICA stent. Persistent restricted diffusion along the gyrus superior to the chronic post treatment cavity in the posterior right hemisphere. The configuration of this diffusion abnormality is unchanged since 05/25/2013.  No associated leftward midline shift. Mildly increased ex vacuo enlargement of the ventricles since December.  Confluent abnormal FLAIR and T2 signal in the right hemisphere is stable since December. Superimposed evolving encephalomalacia in the right MCA territory with intrinsic T1 laminar necrosis intermittently noted. Stable small residual right side extra-axial collection. FLAIR signal in the high left parietal lobe has increased since the prior studies (series 7, image 19 today versus series 7, image 21 on 05/25/2013, series 11, image 20 on 04/1913). Diffusion here is facilitated. This is not contiguous with the abnormal right hemisphere signal; the corpus callosum signal remains normal.  The cystic resection  cavity is stable in size and configuration since December. There is slightly less layering dark T2 fluid within the cavity (series 6, image 17). Decreased T2 * signal in keeping with hemosiderin is re- identified  along the right hemisphere sulci, and along the margins of the resection cavity, and stable.  Negative pituitary, cervicomedullary junction and visualized cervical spine. Bone marrow signal is stable and within normal limits. Stable orbits soft tissues. Stable paranasal sinuses and mastoids, fairly well pneumatized. Visible internal auditory structures appear normal. No acute scalp soft tissue findings.  MRA HEAD FINDINGS  Improved antegrade flow in the posterior circulation since 04/20/13, resolved midbasilar vasospasm. Codominant distal vertebral arteries remain patent. Patent vertebrobasilar junction. Patent AICA vessels. No basilar stenosis. SCA and PCA origins are within normal limits. Significantly improved bilateral PCA branches, now appearing normal. Posterior communicating arteries are diminutive or absent.  Stable antegrade flow in both ICA siphons. On the left there is no ICA stenosis. The left ICA terminus, MCA and ACA origins are normal.  On the right there is persistent persistent increased MRA signal in the bilobed right ICA terminus aneurysm (series 9 images 90-95, and series 903, image 13). However, the aneurysm also demonstrates intrinsic T1 signal on axial series 11 (images 17 and 18). The size of each aneurysm lobe has mildly decreased since 04/20/13 (4 mm anterior lobe, 6-7 mm posterior lobe - versus 6 and 8 mm respectively).  The right ICA terminus remains patent. The right MCA and ACA origins remain patent.  Anterior communicating artery and visualized ACA branches demonstrate improved appearance since November. Bilateral MCA branches demonstrate improved appearance since November. No major MCA or ACA branch occlusion is identified.  IMPRESSION: HEAD MRI IMPRESSION:  1. Sequelae of right MCA infarct, with persistent but unchanged diffusion abnormality in the gyrus superior to the chronic right hemisphere treatment cavity. Significance is unclear, but favor this is sequelae of the November  ischemia, rather than new infarct. 2. 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 3. No significant change in the right hemisphere resection cavity itself, and the surrounding right hemisphere T2 and FLAIR hyperintensity is stable. 4. There is increased left parietal lobe white matter T2 and FLAIR hyperintensity, the etiology and significance unknown. The corpus callosum signal remains normal. HEAD MRA IMPRESSION:  1. Persistent signal in the lobulated right ICA terminus aneurysm, but most likely representing stasis/thrombosis within the aneurysm rather than persistent flow (discussed above). Overall aneurysm size has mildly diminished compared to the pre treatment study. 2. Otherwise significantly improved appearance of major intracranial arteries compared to the November study, at which time severe intracranial vasospasm was noted. MRA findings discussed by telephone with Dr. Kathyrn Sheriff (Neurosurgery) on 07/05/2013 at 21:26 .   Electronically Signed   By: Lars Pinks M.D.   On: 07/05/2013 21:32   Dg Chest Port 1 View  07/05/2013   CLINICAL DATA:  Patient unresponsive  EXAM: PORTABLE CHEST - 1 VIEW  COMPARISON:  05/24/2013  FINDINGS: The heart size and mediastinal contours are within normal limits. Both lungs are clear. The visualized skeletal structures are unremarkable.  IMPRESSION: No active disease.   Electronically Signed   By: Skipper Cliche M.D.   On: 07/05/2013 13:57    Scheduled Meds: . aspirin  300 mg Rectal Daily  . enoxaparin (LOVENOX) injection  40 mg Subcutaneous Q24H  . lacosamide (VIMPAT) IV  50 mg Intravenous Q12H  . levETIRAcetam  500 mg Intravenous Q12H  . pantoprazole (PROTONIX) IV  40 mg Intravenous Q24H  . sodium chloride  3 mL Intravenous Q12H   Continuous Infusions: . sodium chloride 75 mL/hr at 07/06/13 0944    Principal Problem:   Acute encephalopathy Active  Problems:   Astrocytoma Carla tumor   Protein-calorie malnutrition, severe   Seizures   Depression with anxiety   Dysphagia   Altered mental state   Pancytopenia   CKD (chronic kidney disease) stage 3, GFR 30-59 ml/min    Time spent: [facility] Pager [phone]. If 7PM-7AM, please contact night-coverage at [email or web address], password [email or web address] 07/06/2013, 6:34 PM  LOS: 1 day
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
## 2013-07-06 NOTE — Progress Notes (Signed)
UR completed. Patient changed to inpatient- NPO and requiring IVF @ 75cc/hr

## 2013-07-07 ENCOUNTER — Other Ambulatory Visit: Payer: Self-pay

## 2013-07-07 ENCOUNTER — Inpatient Hospital Stay (HOSPITAL_COMMUNITY)
Admit: 2013-07-07 | Discharge: 2013-07-07 | Disposition: A | Discharge status: Home / Self Care | Payer: PRIVATE HEALTH INSURANCE | Attending: Internal Medicine | Admitting: Internal Medicine

## 2013-07-07 DIAGNOSIS — R569 Unspecified convulsions: Secondary | ICD-10-CM

## 2013-07-07 MED ORDER — PANTOPRAZOLE SODIUM 40 MG PO TBEC: 40.0000 mg | DELAYED_RELEASE_TABLET | Freq: Every day | ORAL | Status: DC

## 2013-07-07 MED ORDER — CLOPIDOGREL BISULFATE 75 MG PO TABS
75.0000 mg | ORAL_TABLET | Freq: Every day | ORAL | Status: DC
Start: 2013-07-08 — End: 2013-07-12
  Administered 2013-07-08 – 2013-07-12 (×5): 75 mg via ORAL
  Filled 2013-07-07 (×6): qty 1

## 2013-07-07 MED ORDER — PANTOPRAZOLE SODIUM 40 MG PO TBEC
40.0000 mg | DELAYED_RELEASE_TABLET | Freq: Every day | ORAL | Status: DC
  Administered 2013-07-08 – 2013-07-12 (×4): 40 mg via ORAL
  Filled 2013-07-07 (×5): qty 1

## 2013-07-07 MED ORDER — FLUOXETINE HCL 20 MG PO CAPS
20.0000 mg | ORAL_CAPSULE | Freq: Every day | ORAL | Status: DC
  Administered 2013-07-08 – 2013-07-12 (×4): 20 mg via ORAL
  Filled 2013-07-07 (×5): qty 1

## 2013-07-07 MED ORDER — ASPIRIN EC 325 MG PO TBEC
325.0000 mg | DELAYED_RELEASE_TABLET | Freq: Every day | ORAL | Status: DC
  Administered 2013-07-08 – 2013-07-12 (×4): 325 mg via ORAL
  Filled 2013-07-07 (×5): qty 1

## 2013-07-07 NOTE — Progress Notes (Signed)
Clinical Social Work Progress Note PSYCHIATRY SERVICE LINE 07/07/2013  Patient:  [name]  Account:  [id number]  Admit Date:  07/05/2013  Clinical Social Worker:  Sindy Messing, LCSW  Date/Time:  07/07/2013 10:45 AM  Review of Patient  Overall Medical Condition:   Patient laying in bed and waiting for transfer for EEG to be performed.   Participation Level:  Active  Participation Quality  Resistant   Other Participation Quality:   Patient avoids eye contact and requires encouragement to remain engaged during session.   Affect  Flat   Cognitive  Appropriate   Reaction to Medications/Concerns:   Per chart review, psych MD made medication recommendations.   Modes of Intervention  Support   Summary of Progress/Plan at Discharge   Patient was discussed during progression meeting and RN reports that patient was drowsy yesterday but engaged with husband when he came to visit last night.    CSW met with patient at bedside. Patient laying in bed and reports that she is hungry and wants to be able to eat. RN explained that patient is NPO until procedure completed. Patient reports she does not remember coming to the hospital and is unsure why she is here. CSW explained that patient was hallucinating and paranoid at the SNF but patient reports she does not remember this occurring. Patient reports some hallucinations in the past but denies any visual or auditory hallucinations since being hospitalized. Patient denies any current paranoid thoughts.    CSW and patient discussed admission and patient's decline in mobility. Patient reports she was happy when she was at home and was not depressed. Patient reports after being hospitalized and having to go to SNF that she has felt depressed about 6 out of 7 days. Patient reports she wants to sleep and eat more. Patient reports lack of energy and feels lonely all the time. Patient reports she feels depression would decrease if she could return  back home but understands that is not an option at this time.    Patient avoids eye contact and answers with short responses. Patient has to be encouraged to talk about depression but is able to identify symptoms and agreeable to medication to assist with symptoms. Patient is not happy with current situation and feels that depression would improve if she was back at home.    Per chart review, psych MD made medication changes. CSW spoke with SNF who is agreeable to accept patient back to facility when medically stable. FL2 in chart and CSW will continue to follow.      [place], [place] [phone]

## 2013-07-07 NOTE — Progress Notes (Signed)
[facility] PROGRESS NOTE  [name] [id number] DOB: 1960-06-26 DOA: 07/05/2013 PCP: No primary provider on file.  Assessment/Plan:   AMS/acute encephalopathy -MRI with sequela of right MCA infarct noted but no acute/new infarct -Await obtain EEG, UA unremarkable and chest x-ray of breath no acute infiltrates -Ammonia level within normal limits -Clinically improving Psychotic disorder NOS -Patient more alert today still reports hallucinations -QTC is borderline at 495 at this time>> so we'll not start Haldol will discuss with psych for further recommendations -Although not listed on patient's Epic med rec, paper records from old and living indicate that patient was on Prozac will resume and also discuss with psychiatry. 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  Seizures  Continue Keppra and vimpat obtain EEG as above and follow Pancytopenia  -Unclear etiology, follow recheck. No gross bleeding -Follow and recheck CKD (chronic kidney disease) stage 3, GFR 30-59 ml/min  Seems at baseline. continue IV hydration   Code Status: Full Family Communication: Father by phone [phone] Disposition Plan: Pending clinical course   Consultants:  Psychiatry  Procedures:  None  Antibiotics:  None   HPI/Subjective: Patient alert today and oriented x3, feeding herself lunch. admits to hallucinations. The bedside  Objective: Filed Vitals:   07/07/13 1410  BP: 122/79  Pulse: 88  Temp: 98.7 F (37.1 C)  Resp: 18    Intake/Output Summary (Last 24 hours) at 07/07/13 1857 Last data filed at 07/07/13 1839  Gross per 24 hour  Intake   2595 ml  Output      0 ml  Net   2595 ml   Filed Weights   07/05/13 1840  Weight: 59.2 kg (130 lb 8.2 oz)    Exam:  General: Repeat but easily  aroused, disoriented In NAD Cardiovascular: RRR, nl S1 s2 Respiratory: CTAB Abdomen: soft +BS NT/ND, no masses palpable Extremities: No cyanosis and no edema     Data Reviewed: Basic Metabolic Panel:  Recent Labs Lab 07/05/13 1310  NA 142  K 3.9  CL 107  CO2 24  GLUCOSE 120*  BUN 19  CREATININE 1.18*  CALCIUM 9.8   Liver Function Tests: No results found for this basename: AST, ALT, ALKPHOS, BILITOT, PROT, ALBUMIN,  in the last 168 hours No results found for this basename: LIPASE, AMYLASE,  in the last 168 hours  Recent Labs Lab 07/05/13 1519  AMMONIA 52   CBC:  Recent Labs Lab 07/05/13 1310  WBC 2.8*  NEUTROABS 1.6*  HGB 9.7*  HCT 27.1*  MCV 89.1  PLT 120*   Cardiac Enzymes: No results found for this basename: CKTOTAL, CKMB, CKMBINDEX, TROPONINI,  in the last 168 hours BNP (last 3 results)  Recent Labs  05/25/13 0320  PROBNP 212.1*   CBG: No results found for this basename: GLUCAP,  in the last 168 hours  No results found for this or any previous visit (from the past 240 hour(s)).   Studies: Mr Virgel Paling Wo Contrast  07/05/2013   CLINICAL DATA:  53 year old female with altered mental status, confusion, agitation, acute encephalopathy. Initial encounter. History of astrocytoma resection and radiation therapy several years ago. More recently, the patient suffered right MCA infarct, and right subdural and subarachnoid hemorrhage (November 2014). The patient also underwent intracranial right ICA aneurysm endovascular treatment (pipeline stent) in December 2014.  EXAM: MRI HEAD WITHOUT CONTRAST  MRA HEAD WITHOUT CONTRAST  TECHNIQUE: Multiplanar, multiecho pulse sequences of the brain and surrounding structures were obtained without intravenous contrast. Angiographic images of the head were obtained using MRA technique without contrast.  COMPARISON:  Head CT 07/05/2013.  Brain MRI 05/25/2013 and earlier.  FINDINGS: MRI HEAD FINDINGS  Major intracranial vascular  flow voids are stable. Mild susceptibility artifact related to the supra clinoid right ICA stent. Persistent restricted diffusion along the gyrus superior to the chronic post treatment cavity in the posterior right hemisphere. The configuration of this diffusion abnormality is unchanged since 05/25/2013.  No associated leftward midline shift. Mildly increased ex vacuo enlargement of the ventricles since December.  Confluent abnormal FLAIR and T2 signal in the right hemisphere is stable since December. Superimposed evolving encephalomalacia in the right MCA territory with intrinsic T1 laminar necrosis intermittently noted. Stable small residual right side extra-axial collection. FLAIR signal in the high left parietal lobe has increased since the prior studies (series 7, image 19 today versus series 7, image 21 on 05/25/2013, series 11, image 20 on 04/1913). Diffusion here is facilitated. This is not contiguous with the abnormal right hemisphere signal; the corpus callosum signal remains normal.  The cystic resection cavity is stable in size and configuration since December. There is slightly less layering dark T2 fluid within the cavity (series 6, image 17). Decreased T2 * signal in keeping with hemosiderin is re- identified along the right hemisphere sulci, and along the margins of the resection cavity, and stable.  Negative pituitary, cervicomedullary junction and visualized cervical spine. Bone marrow signal is stable and within normal limits. Stable orbits soft tissues. Stable paranasal sinuses and mastoids, fairly well pneumatized. Visible internal auditory structures appear normal. No acute scalp soft tissue findings.  MRA HEAD FINDINGS  Improved antegrade flow in the posterior circulation since 04/20/13, resolved midbasilar vasospasm. Codominant distal vertebral arteries remain patent. Patent vertebrobasilar junction. Patent AICA vessels. No basilar stenosis. SCA and PCA origins are within normal limits.  Significantly improved bilateral PCA branches, now appearing normal. Posterior communicating arteries are diminutive or absent.  Stable antegrade flow in both ICA siphons. On the left there is no ICA stenosis. The left ICA terminus, MCA and ACA origins are normal.  On the right there is persistent persistent increased MRA signal in the bilobed right ICA terminus aneurysm (series 9 images 90-95, and series 903, image 13). However, the aneurysm also demonstrates intrinsic T1 signal on axial series 11 (images 17 and 18). The size of each aneurysm lobe has mildly decreased since 04/20/13 (4 mm anterior lobe, 6-7 mm posterior lobe - versus 6 and 8 mm respectively).  The right ICA terminus remains patent. The right MCA and ACA origins remain patent.  Anterior communicating artery and visualized ACA branches demonstrate improved appearance since November. Bilateral MCA branches demonstrate improved appearance since November. No major MCA or ACA branch occlusion is identified.  IMPRESSION: HEAD MRI IMPRESSION:  1. Sequelae of right MCA infarct, with persistent but unchanged diffusion abnormality in the gyrus superior to the chronic right hemisphere treatment cavity. Significance is unclear, but favor this is sequelae of the November ischemia, rather than new infarct. 2. 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 3. No significant change in the right hemisphere resection cavity itself, and the surrounding right hemisphere T2 and FLAIR hyperintensity is stable. 4. There is increased left parietal lobe white matter T2 and FLAIR hyperintensity, the etiology  and significance unknown. The corpus callosum signal remains normal. HEAD MRA IMPRESSION:  1. Persistent signal in the lobulated right ICA terminus aneurysm, but most likely representing stasis/thrombosis within the aneurysm rather than persistent flow  (discussed above). Overall aneurysm size has mildly diminished compared to the pre treatment study. 2. Otherwise significantly improved appearance of major intracranial arteries compared to the November study, at which time severe intracranial vasospasm was noted. MRA findings discussed by telephone with Dr. Kathyrn Sheriff (Neurosurgery) on 07/05/2013 at 21:26 .   Electronically Signed   By: Lars Pinks M.D.   On: 07/05/2013 21:32   Mr Brain Wo Contrast  07/05/2013   CLINICAL DATA:  53 year old female with altered mental status, confusion, agitation, acute encephalopathy. Initial encounter. History of astrocytoma resection and radiation therapy several years ago. More recently, the patient suffered right MCA infarct, and right subdural and subarachnoid hemorrhage (November 2014). The patient also underwent intracranial right ICA aneurysm endovascular treatment (pipeline stent) in December 2014.  EXAM: MRI HEAD WITHOUT CONTRAST  MRA HEAD WITHOUT CONTRAST  TECHNIQUE: Multiplanar, multiecho pulse sequences of the brain and surrounding structures were obtained without intravenous contrast. Angiographic images of the head were obtained using MRA technique without contrast.  COMPARISON:  Head CT 07/05/2013.  Brain MRI 05/25/2013 and earlier.  FINDINGS: MRI HEAD FINDINGS  Major intracranial vascular flow voids are stable. Mild susceptibility artifact related to the supra clinoid right ICA stent. Persistent restricted diffusion along the gyrus superior to the chronic post treatment cavity in the posterior right hemisphere. The configuration of this diffusion abnormality is unchanged since 05/25/2013.  No associated leftward midline shift. Mildly increased ex vacuo enlargement of the ventricles since December.  Confluent abnormal FLAIR and T2 signal in the right hemisphere is stable since December. Superimposed evolving encephalomalacia in the right MCA territory with intrinsic T1 laminar necrosis intermittently noted. Stable small  residual right side extra-axial collection. FLAIR signal in the high left parietal lobe has increased since the prior studies (series 7, image 19 today versus series 7, image 21 on 05/25/2013, series 11, image 20 on 04/1913). Diffusion here is facilitated. This is not contiguous with the abnormal right hemisphere signal; the corpus callosum signal remains normal.  The cystic resection cavity is stable in size and configuration since December. There is slightly less layering dark T2 fluid within the cavity (series 6, image 17). Decreased T2 * signal in keeping with hemosiderin is re- identified along the right hemisphere sulci, and along the margins of the resection cavity, and stable.  Negative pituitary, cervicomedullary junction and visualized cervical spine. Bone marrow signal is stable and within normal limits. Stable orbits soft tissues. Stable paranasal sinuses and mastoids, fairly well pneumatized. Visible internal auditory structures appear normal. No acute scalp soft tissue findings.  MRA HEAD FINDINGS  Improved antegrade flow in the posterior circulation since 04/20/13, resolved midbasilar vasospasm. Codominant distal vertebral arteries remain patent. Patent vertebrobasilar junction. Patent AICA vessels. No basilar stenosis. SCA and PCA origins are within normal limits. Significantly improved bilateral PCA branches, now appearing normal. Posterior communicating arteries are diminutive or absent.  Stable antegrade flow in both ICA siphons. On the left there is no ICA stenosis. The left ICA terminus, MCA and ACA origins are normal.  On the right there is persistent persistent increased MRA signal in the bilobed right ICA terminus aneurysm (series 9 images 90-95, and series 903, image 13). However, the aneurysm also demonstrates intrinsic T1 signal on axial series 11 (images 17 and 18). The size  of each aneurysm lobe has mildly decreased since 04/20/13 (4 mm anterior lobe, 6-7 mm posterior lobe - versus 6 and  8 mm respectively).  The right ICA terminus remains patent. The right MCA and ACA origins remain patent.  Anterior communicating artery and visualized ACA branches demonstrate improved appearance since November. Bilateral MCA branches demonstrate improved appearance since November. No major MCA or ACA branch occlusion is identified.  IMPRESSION: HEAD MRI IMPRESSION:  1. Sequelae of right MCA infarct, with persistent but unchanged diffusion abnormality in the gyrus superior to the chronic right hemisphere treatment cavity. Significance is unclear, but favor this is sequelae of the November ischemia, rather than new infarct. 2. 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 3. No significant change in the right hemisphere resection cavity itself, and the surrounding right hemisphere T2 and FLAIR hyperintensity is stable. 4. There is increased left parietal lobe white matter T2 and FLAIR hyperintensity, the etiology and significance unknown. The corpus callosum signal remains normal. HEAD MRA IMPRESSION:  1. Persistent signal in the lobulated right ICA terminus aneurysm, but most likely representing stasis/thrombosis within the aneurysm rather than persistent flow (discussed above). Overall aneurysm size has mildly diminished compared to the pre treatment study. 2. Otherwise significantly improved appearance of major intracranial arteries compared to the November study, at which time severe intracranial vasospasm was noted. MRA findings discussed by telephone with Dr. Kathyrn Sheriff (Neurosurgery) on 07/05/2013 at 21:26 .   Electronically Signed   By: Lars Pinks M.D.   On: 07/05/2013 21:32    Scheduled Meds: . aspirin  300 mg Rectal Daily  . enoxaparin (LOVENOX) injection  40 mg Subcutaneous Q24H  . lacosamide (VIMPAT) IV  50 mg Intravenous Q12H  . levETIRAcetam  500 mg Intravenous Q12H  . pantoprazole  (PROTONIX) IV  40 mg Intravenous Q24H  . sodium chloride  3 mL Intravenous Q12H   Continuous Infusions: . sodium chloride 75 mL/hr at 07/07/13 1655    Principal Problem:   Acute encephalopathy Active Problems:   Astrocytoma brain tumor   Protein-calorie malnutrition, severe   Seizures   Depression with anxiety   Dysphagia   Altered mental state   Pancytopenia   CKD (chronic kidney disease) stage 3, GFR 30-59 ml/min    Time spent: [facility] Pager [phone]. If 7PM-7AM, please contact night-coverage at [email or web address], password [facility] 07/07/2013, 6:57 PM  LOS: 2 days

## 2013-07-07 NOTE — Progress Notes (Signed)
Offsite adult EEG complete at [facility]

## 2013-07-08 DIAGNOSIS — E43 Unspecified severe protein-calorie malnutrition: Secondary | ICD-10-CM

## 2013-07-08 DIAGNOSIS — F323 Major depressive disorder, single episode, severe with psychotic features: Secondary | ICD-10-CM

## 2013-07-08 LAB — CBC
HCT: 23.4 % — ABNORMAL LOW (ref 36.0–46.0)
HEMOGLOBIN: 8 g/dL — AB (ref 12.0–15.0)
MCH: 30.8 pg (ref 26.0–34.0)
MCHC: 34.2 g/dL (ref 30.0–36.0)
MCV: 90 fL (ref 78.0–100.0)
Platelets: 114 10*3/uL — ABNORMAL LOW (ref 150–400)
RBC: 2.6 MIL/uL — ABNORMAL LOW (ref 3.87–5.11)
RDW: 14.6 % (ref 11.5–15.5)
WBC: 3 10*3/uL — AB (ref 4.0–10.5)

## 2013-07-08 LAB — BASIC METABOLIC PANEL
BUN: 19 mg/dL (ref 6–23)
CHLORIDE: 110 meq/L (ref 96–112)
CO2: 19 mEq/L (ref 19–32)
Calcium: 8.9 mg/dL (ref 8.4–10.5)
Creatinine, Ser: 1.17 mg/dL — ABNORMAL HIGH (ref 0.50–1.10)
GFR, EST AFRICAN AMERICAN: 60 mL/min — AB (ref >90)
GFR, EST NON AFRICAN AMERICAN: 51 mL/min — AB (ref >90)
Glucose, Bld: 105 mg/dL — ABNORMAL HIGH (ref 70–99)
POTASSIUM: 3.7 meq/L (ref 3.7–5.3)
SODIUM: 140 meq/L (ref 137–147)

## 2013-07-08 MED ORDER — RISPERIDONE 1 MG PO TABS
1.0000 mg | ORAL_TABLET | Freq: Every day | ORAL | Status: DC
  Administered 2013-07-08 – 2013-07-11 (×4): 1 mg via ORAL
  Filled 2013-07-08 (×5): qty 1

## 2013-07-08 NOTE — Progress Notes (Signed)
[facility] PROGRESS NOTE  [name] VOZ:[id number] DOB: Aug 01, 1960 DOA: 07/05/2013 PCP: No primary provider on file.  Assessment/Plan:   AMS/acute encephalopathy -MRI with sequela of right MCA infarct noted but no acute/new infarct -Await  EEG, UA unremarkable and chest x-ray of breath no acute infiltrates -Ammonia level within normal limits -Clinically improving Psychotic disorder NOS -Patient more alert today still reports hallucinations -QTC is borderline at 495 at this time>> Haldol not started, will place on risperdal as per psych recommendations -Although not listed on patient's Epic med rec, paper records from old and living indicate that patient was on Prozac-resumed on 2/5, will continue -Appreciate psychiatry assistance. 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  Seizures  Continue Keppra and vimpat Await EEG as above and follow Pancytopenia  -Unclear etiology, follow recheck. No gross bleeding -Follow and recheck CKD (chronic kidney disease) stage 3, GFR 30-59 ml/min  Seems at baseline. continue IV hydration   Code Status: Full Family Communication: Father by phone [phone] Disposition Plan: Pending clinical course   Consultants:  Psychiatry  Procedures:  None  Antibiotics:  None   HPI/Subjective: Denies any new complaints, father at the bedside  Objective: Filed Vitals:   07/08/13 1240  BP: 130/84  Pulse: 90  Temp: 99.8 F (37.7 C)  Resp: 16    Intake/Output Summary (Last 24 hours) at 07/08/13 1851 Last data filed at 07/08/13 0618  Gross per 24 hour  Intake 1132.5 ml  Output      0 ml  Net 1132.5 ml   Filed Weights   07/05/13 1840  Weight: 59.2 kg (130 lb 8.2 oz)    Exam:  General: Awake and orientedx3, in no apparent distress Cardiovascular:  RRR, nl S1 s2 Respiratory: CTAB Abdomen: soft +BS NT/ND, no masses palpable Extremities: No cyanosis and no edema     Data Reviewed: Basic Metabolic Panel:  Recent Labs Lab 07/05/13 1310 07/08/13 0453  NA 142 140  K 3.9 3.7  CL 107 110  CO2 24 19  GLUCOSE 120* 105*  BUN 19 19  CREATININE 1.18* 1.17*  CALCIUM 9.8 8.9   Liver Function Tests: No results found for this basename: AST, ALT, ALKPHOS, BILITOT, PROT, ALBUMIN,  in the last 168 hours No results found for this basename: LIPASE, AMYLASE,  in the last 168 hours  Recent Labs Lab 07/05/13 1519  AMMONIA 52   CBC:  Recent Labs Lab 07/05/13 1310 07/08/13 0453  WBC 2.8* 3.0*  NEUTROABS 1.6*  --   HGB 9.7* 8.0*  HCT 27.1* 23.4*  MCV 89.1 90.0  PLT 120* 114*   Cardiac Enzymes: No results found for this basename: CKTOTAL, CKMB, CKMBINDEX, TROPONINI,  in the last 168 hours BNP (last 3 results)  Recent Labs  05/25/13 0320  PROBNP 212.1*   CBG: No results found for this basename: GLUCAP,  in the last 168 hours  No results found for this or any previous visit (from the past 240 hour(s)).   Studies: No results found.  Scheduled Meds: . aspirin  325 mg Oral Daily  . clopidogrel  75 mg Oral Q breakfast  . enoxaparin (LOVENOX) injection  40 mg Subcutaneous Q24H  . FLUoxetine  20 mg Oral Daily  . lacosamide (VIMPAT) IV  50 mg Intravenous Q12H  . levETIRAcetam  500 mg Intravenous Q12H  . pantoprazole  40  mg Oral Daily  . risperiDONE  1 mg Oral QHS  . sodium chloride  3 mL Intravenous Q12H   Continuous Infusions:    Principal Problem:   Acute encephalopathy Active Problems:   Astrocytoma brain tumor   Protein-calorie malnutrition, severe   Seizures   Depression with anxiety   Dysphagia   Altered mental state   Pancytopenia   CKD (chronic kidney disease) stage 3, GFR 30-59 ml/min    Time spent: [facility] Pager [phone]. If 7PM-7AM, please contact  night-coverage at [email or web address], password [facility] 07/08/2013, 6:51 PM  LOS: 3 days

## 2013-07-08 NOTE — Progress Notes (Signed)
Clinical Social Work  CSW reviewed chart which stated that psych MD evaluated patient and made some medication changes. Patient reports that she is too hungry to participate in assessment at this time. CSW called nutrition services who verified that lunch was ordered and would be delivered shortly.   Patient's father present and reports he came to visit. Father reports that patient "looks much better" but states "I don't know why she just snaps sometimes." This appeared to upset patient and she mumbled for dad to "shut up." Patient reports she only wants to talk about her food and asked CSW follow up at later time.  CSW will continue to follow in order to assist with transfer back to SNF when medically stable.  [place], [place] [phone]

## 2013-07-08 NOTE — Consult Note (Signed)
Follow up Psychiatry Consult    Assessment: AXIS I: Maj. depressive disorder with psychotic features, mood disorder due to general medical condition AXIS II:  Deferred AXIS III:   Past Medical History  Diagnosis Date  . 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 Fracture, ankle 2012    right  . History of radiation therapy 09/23/10- 11/07/10    right parietal lobe  . Chronic headaches     uses oxycodone appropriately  . Anxiety   . Muscle spasticity   . Seizures   . Dysphagia    AXIS IV:  other psychosocial or environmental problems, problems related to social environment and problems with primary support group AXIS V:  31-40 impairment in reality testing  Plan:  No evidence of imminent risk to self or others at present.   Medication management  Subjective:   Patient seen chart reviewed.  The patient is more alert and cooperative.  I also spoke to her father who was present in the room.  Patient is less confused however she continues to have depression and she remains very withdrawn.  Her father endorse episodes of hallucinations, paranoia and talking to herself.  Patient did not provide much information but remains very guarded withdrawn but limited eye contact.  She was recommended to start Haldol however due to QT prolongation it has been not started.  Patient is taking Prozac.  Patient does not reported any side effects she denies any hallucination or any paranoia but remains very guarded.  Past Psychiatric History: Past Medical History  Diagnosis Date  . 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 Fracture, ankle 2012    right  . History of radiation therapy 09/23/10- 11/07/10    right parietal lobe  . Chronic headaches     uses oxycodone appropriately  . Anxiety   . Muscle spasticity   . Seizures   . Dysphagia     reports that she has never smoked. She has never used smokeless tobacco. She reports that she drinks alcohol. She reports  that she does not use illicit drugs. Family History  Problem Relation Age of Onset  . Breast cancer Mother          Abuse/Neglect [facility]) Physical Abuse: Denies Verbal Abuse: Denies Sexual Abuse: Denies Allergies:   Allergies  Allergen Reactions  . Vasotec Other (See Comments)    Causes angioedema    ACT Assessment Complete:  Yes:    Educational Status    Risk to Self: Risk to self Is patient at risk for suicide?: No Substance abuse history and/or treatment for substance abuse?: No  Risk to Others:    Abuse: Abuse/Neglect Assessment (Assessment to be complete while patient is alone) Physical Abuse: Denies Verbal Abuse: Denies Sexual Abuse: Denies Exploitation of patient/patient's resources: Denies Self-Neglect: Denies  Prior Inpatient Therapy:    Prior Outpatient Therapy:    Additional Information:                    Objective: Blood pressure 127/78, pulse 90, temperature 99.5 F (37.5 C), temperature source Oral, resp. rate 18, height _0  (1.676 m), weight 130 lb 8.2 oz (59.2 kg), last menstrual period 08/26/2010, SpO2 96.00%.Body mass index is 21.08 kg/(m^2). Results for orders placed during the hospital encounter of 07/05/13 (from the past 72 hour(s))  AMMONIA     Status: None   Collection Time    07/05/13  3:19 PM  Result Value Range   Ammonia 52  11 - 60 umol/L  BASIC METABOLIC PANEL     Status: Abnormal   Collection Time    07/08/13  4:53 AM      Result Value Range   Sodium 140  137 - 147 mEq/L   Potassium 3.7  3.7 - 5.3 mEq/L   Chloride 110  96 - 112 mEq/L   CO2 19  19 - 32 mEq/L   Glucose, Bld 105 (*) 70 - 99 mg/dL   BUN 19  6 - 23 mg/dL   Creatinine, Ser 1.17 (*) 0.50 - 1.10 mg/dL   Calcium 8.9  8.4 - 10.5 mg/dL   GFR calc non Af Amer 51 (*) >90 mL/min   GFR calc Af Amer 60 (*) >90 mL/min   Comment: (NOTE)     The eGFR has been calculated using the CKD EPI equation.     This calculation has not been validated in all clinical  situations.     eGFR's persistently <90 mL/min signify possible Chronic Kidney     Disease.  CBC     Status: Abnormal   Collection Time    07/08/13  4:53 AM      Result Value Range   WBC 3.0 (*) 4.0 - 10.5 K/uL   RBC 2.60 (*) 3.87 - 5.11 MIL/uL   Hemoglobin 8.0 (*) 12.0 - 15.0 g/dL   HCT 23.4 (*) 36.0 - 46.0 %   MCV 90.0  78.0 - 100.0 fL   MCH 30.8  26.0 - 34.0 pg   MCHC 34.2  30.0 - 36.0 g/dL   RDW 14.6  11.5 - 15.5 %   Platelets 114 (*) 150 - 400 K/uL   Comment: SPECIMEN CHECKED FOR CLOTS     PLATELET COUNT CONFIRMED BY SMEAR   Labs are reviewed and are pertinent for low WBC count .  Current Facility-Administered Medications  Medication Dose Route Frequency Provider Last Rate Last Dose  . acetaminophen (TYLENOL) tablet 650 mg  650 mg Oral Q6H PRN Nishant Dhungel, MD       Or  . acetaminophen (TYLENOL) suppository 650 mg  650 mg Rectal Q6H PRN Nishant Dhungel, MD      . aspirin EC tablet 325 mg  325 mg Oral Daily Adeline C Viyuoh, MD   325 mg at 07/08/13 1029  . clopidogrel (PLAVIX) tablet 75 mg  75 mg Oral Q breakfast Sheila Oats, MD   75 mg at 07/08/13 0852  . enoxaparin (LOVENOX) injection 40 mg  40 mg Subcutaneous Q24H Nishant Dhungel, MD   40 mg at 07/07/13 2209  . FLUoxetine (PROZAC) capsule 20 mg  20 mg Oral Daily Adeline C Viyuoh, MD   20 mg at 07/08/13 1030  . haloperidol lactate (HALDOL) injection 2 mg  2 mg Intravenous Q6H PRN Nishant Dhungel, MD   1 mg at 07/05/13 1956  . lacosamide (VIMPAT) 50 mg in sodium chloride 0.9 % 25 mL IVPB  50 mg Intravenous Q12H Nishant Dhungel, MD   50 mg at 07/08/13 1030  . levETIRAcetam (KEPPRA) 500 mg in sodium chloride 0.9 % 100 mL IVPB  500 mg Intravenous Q12H Nishant Dhungel, MD   500 mg at 07/08/13 0852  . ondansetron (ZOFRAN) tablet 4 mg  4 mg Oral Q6H PRN Nishant Dhungel, MD       Or  . ondansetron (ZOFRAN) injection 4 mg  4 mg Intravenous Q6H PRN Nishant Dhungel, MD      . pantoprazole (  PROTONIX) EC tablet 40 mg  40 mg Oral  Daily Adeline C Viyuoh, MD   40 mg at 07/08/13 1030  . sodium chloride 0.9 % injection 3 mL  3 mL Intravenous Q12H Nishant Dhungel, MD   3 mL at 07/06/13 2204    Psychiatric Specialty Exam:     Blood pressure 127/78, pulse 90, temperature 99.5 F (37.5 C), temperature source Oral, resp. rate 18, height _0  (1.676 m), weight 130 lb 8.2 oz (59.2 kg), last menstrual period 08/26/2010, SpO2 96.00%.Body mass index is 21.08 kg/(m^2).  General Appearance: Guarded  Eye Contact::  Poor  Speech:  Blocked  Volume:  Decreased  Mood:  Irritable  Affect:  Constricted and Labile  Thought Process:  Disorganized  Orientation:  NA  Thought Content:  Delusions and Paranoid Ideation  Suicidal Thoughts:  No  Homicidal Thoughts:  No  Memory:  poor  Judgement:  Impaired  Insight:  Lacking  Psychomotor Activity:  Increased  Concentration:  Poor  Recall:  Poor  Fund of Knowledge:Poor  Language: Poor  Akathisia:  No  Handed:  Right  AIMS (if indicated):     Assets:  Housing  Sleep:      Musculoskeletal: Strength & Muscle Tone: decreased Gait & Station: Patient lying on the bed Patient leans: N/A  Treatment Plan Summary: Medication management.  Consider Risperdal 1 mg at bedtime if not medically contraindicated .  Continue current antidepressant.  Monitor side effects especially EPS and tremors.  Please call [phone].  Consultation liaison services will follow the patient if needed.   Jeannelle Wiens T. 07/08/2013 1:41 PM

## 2013-07-09 DIAGNOSIS — IMO0002 Reserved for concepts with insufficient information to code with codable children: Secondary | ICD-10-CM

## 2013-07-09 DIAGNOSIS — F411 Generalized anxiety disorder: Secondary | ICD-10-CM

## 2013-07-09 DIAGNOSIS — C719 Malignant neoplasm of brain, unspecified: Secondary | ICD-10-CM

## 2013-07-09 LAB — CBC
HCT: 23.7 % — ABNORMAL LOW (ref 36.0–46.0)
HEMOGLOBIN: 8.2 g/dL — AB (ref 12.0–15.0)
MCH: 31.3 pg (ref 26.0–34.0)
MCHC: 34.6 g/dL (ref 30.0–36.0)
MCV: 90.5 fL (ref 78.0–100.0)
Platelets: 108 10*3/uL — ABNORMAL LOW (ref 150–400)
RBC: 2.62 MIL/uL — AB (ref 3.87–5.11)
RDW: 14.6 % (ref 11.5–15.5)
WBC: 2.5 10*3/uL — AB (ref 4.0–10.5)

## 2013-07-09 NOTE — Progress Notes (Signed)
[facility] PROGRESS NOTE  [name] [id number] DOB: [birth year] DOA: 07/05/2013 PCP: No primary provider on file.  Assessment/Plan:   AMS/acute encephalopathy -MRI with sequela of right MCA infarct noted but no acute/new infarct -He'll Await  EEG, UA unremarkable and chest x-ray - no acute infiltrates -Ammonia level within normal limits -Continues to improve clinically  Psychotic disorder NOS -QTC is borderline at 495 at this time>> Haldol not started, psych followed up and change to risperdal on 2/6  -Although not listed on patient's Epic med rec, paper records from old and living indicate that patient was on Prozac-resumed on 2/5, will continue -Appreciate psychiatry assistance. -No further hallucinations reported today History of CVA and SAH -Continue Plavix this patient tolerating by mouth's Astrocytoma brain tumor  Status post surgical resection and radiation therapy in 2012  Protein-calorie malnutrition, severe  Currently n.p.o.  Seizures  Continue Keppra and vimpat Still Await EEG as above and follow Pancytopenia  -Unclear etiology, follow recheck. No gross bleeding -Hemoglobin stable, platelet count continuing to trend down, will DC Lovenox and place on SCDs. CKD (chronic kidney disease) stage 3, GFR 30-59 ml/min  Stable   Code Status: Full Family Communication: Father by phone [phone] Disposition Plan: back To SNF when medically ready   Consultants:  Psychiatry  Procedures:  None  Antibiotics:  None   HPI/Subjective: States she did not feel like eating much earlier today, but denies any nausea or vomiting. Alert and oriented x3.  Objective: Filed Vitals:   07/09/13 1400  BP: 108/72  Pulse: 76  Temp: 99 F (37.2 C)  Resp: 18   No intake or output data in the 24 hours ending 07/09/13 1830 Filed Weights   07/05/13 1840  Weight: 59.2 kg (130 lb 8.2 oz)    Exam:  General: Awake and orientedx3, in no apparent  distress Cardiovascular: RRR, nl S1 s2 Respiratory: CTAB Abdomen: soft +BS NT/ND, no masses palpable Extremities: No cyanosis and no edema     Data Reviewed: Basic Metabolic Panel:  Recent Labs Lab 07/05/13 1310 07/08/13 0453  NA 142 140  K 3.9 3.7  CL 107 110  CO2 24 19  GLUCOSE 120* 105*  BUN 19 19  CREATININE 1.18* 1.17*  CALCIUM 9.8 8.9   Liver Function Tests: No results found for this basename: AST, ALT, ALKPHOS, BILITOT, PROT, ALBUMIN,  in the last 168 hours No results found for this basename: LIPASE, AMYLASE,  in the last 168 hours  Recent Labs Lab 07/05/13 1519  AMMONIA 52   CBC:  Recent Labs Lab 07/05/13 1310 07/08/13 0453 07/09/13 0524  WBC 2.8* 3.0* 2.5*  NEUTROABS 1.6*  --   --   HGB 9.7* 8.0* 8.2*  HCT 27.1* 23.4* 23.7*  MCV 89.1 90.0 90.5  PLT 120* 114* 108*   Cardiac Enzymes: No results found for this basename: CKTOTAL, CKMB, CKMBINDEX, TROPONINI,  in the last 168 hours BNP (last 3 results)  Recent Labs  05/25/13 0320  PROBNP 212.1*   CBG: No results found for this basename: GLUCAP,  in the last 168 hours  No results found for this or any previous visit (from the past 240 hour(s)).   Studies: No results found.  Scheduled Meds: . aspirin  325 mg Oral Daily  . clopidogrel  75 mg Oral Q breakfast  . enoxaparin (LOVENOX) injection  40 mg Subcutaneous Q24H  . FLUoxetine  20 mg Oral Daily  . lacosamide (VIMPAT) IV  50 mg Intravenous Q12H  . levETIRAcetam  500 mg Intravenous Q12H  . pantoprazole  40 mg Oral Daily  . risperiDONE  1 mg Oral QHS  . sodium chloride  3 mL Intravenous Q12H   Continuous Infusions:    Principal Problem:   Acute encephalopathy Active Problems:   Astrocytoma brain tumor   Protein-calorie malnutrition, severe   Seizures   Depression with anxiety   Dysphagia   Altered mental state   Pancytopenia   CKD (chronic kidney disease) stage 3, GFR 30-59 ml/min    Time spent: [facility] Pager [phone]. If 7PM-7AM, please contact night-coverage at [email or web address], password [facility] 07/09/2013, 6:30 PM  LOS: 4 days

## 2013-07-10 MED ORDER — LEVETIRACETAM 500 MG PO TABS
500.0000 mg | ORAL_TABLET | Freq: Two times a day (BID) | ORAL | Status: DC
  Administered 2013-07-10 – 2013-07-12 (×4): 500 mg via ORAL
  Filled 2013-07-10 (×6): qty 1

## 2013-07-10 MED ORDER — LACOSAMIDE 50 MG PO TABS
50.0000 mg | ORAL_TABLET | Freq: Two times a day (BID) | ORAL | Status: DC
  Administered 2013-07-10 – 2013-07-12 (×3): 50 mg via ORAL
  Filled 2013-07-10 (×4): qty 1

## 2013-07-10 MED ORDER — SODIUM CHLORIDE 0.9 % IV SOLN
INTRAVENOUS | Status: DC
  Administered 2013-07-10: 15:00:00 via INTRAVENOUS

## 2013-07-10 NOTE — Progress Notes (Signed)
[facility] PROGRESS NOTE  [name] TSV:[id number] DOB: [birth year] DOA: 07/05/2013 PCP: No primary provider on file.  Assessment/Plan:   AMS/acute encephalopathy -MRI with sequela of right MCA infarct noted but no acute/new infarct -He'll Await  EEG, UA unremarkable and chest x-ray - no acute infiltrates -Ammonia level within normal limits Psychotic disorder NOS -QTC is borderline at 495 at this time>> Haldol not started, psych followed up and change to risperdal on 2/6  -Although not listed on patient's Epic med rec, paper records from old and living indicate that patient was on Prozac-resumed on 2/5, will continue -Appreciate psychiatry assistance. -No further hallucinations reported today -Continue risperdal History of CVA and SAH -Continue Plavix this patient tolerating by mouth's Astrocytoma brain tumor  Status post surgical resection and radiation therapy in 2012  Protein-calorie malnutrition, severe  Currently n.p.o.  Seizures  Continue Keppra and vimpat Still Await EEG as above and follow Pancytopenia  -Unclear etiology, follow recheck. No gross bleeding -Hemoglobin stable, Lovenox DC'd on 2/7 and placed on SCDs,recheck platelet count in a.m.Marland Kitchen CKD (chronic kidney disease) stage 3, GFR 30-59 ml/min  -recheck in am   Code Status: Full Family Communication: Father by phone [phone] Disposition Plan: back To SNF when medically ready   Consultants:  Psychiatry  Procedures:  None  Antibiotics:  None   HPI/Subjective: Somnolent today and arousable. Denies any complaints. Father at the bedside  Objective: Filed Vitals:   07/10/13 0624  BP: 124/84  Pulse: 86  Temp: 98.9 F (37.2 C)  Resp: 18   No intake or output data in the 24 hours ending 07/10/13 1450 Filed Weights   07/05/13 1840  Weight: 59.2 kg (130 lb 8.2 oz)    Exam:  General: Somnolent but arousable and appropriate in no apparent distress Cardiovascular: RRR, nl S1  s2 Respiratory: CTAB Abdomen: soft +BS NT/ND, no masses palpable Extremities: No cyanosis and no edema     Data Reviewed: Basic Metabolic Panel:  Recent Labs Lab 07/05/13 1310 07/08/13 0453  NA 142 140  K 3.9 3.7  CL 107 110  CO2 24 19  GLUCOSE 120* 105*  BUN 19 19  CREATININE 1.18* 1.17*  CALCIUM 9.8 8.9   Liver Function Tests: No results found for this basename: AST, ALT, ALKPHOS, BILITOT, PROT, ALBUMIN,  in the last 168 hours No results found for this basename: LIPASE, AMYLASE,  in the last 168 hours  Recent Labs Lab 07/05/13 1519  AMMONIA 52   CBC:  Recent Labs Lab 07/05/13 1310 07/08/13 0453 07/09/13 0524  WBC 2.8* 3.0* 2.5*  NEUTROABS 1.6*  --   --   HGB 9.7* 8.0* 8.2*  HCT 27.1* 23.4* 23.7*  MCV 89.1 90.0 90.5  PLT 120* 114* 108*   Cardiac Enzymes: No results found for this basename: CKTOTAL, CKMB, CKMBINDEX, TROPONINI,  in the last 168 hours BNP (last 3 results)  Recent Labs  05/25/13 0320  PROBNP 212.1*   CBG: No results found for this basename: GLUCAP,  in the last 168 hours  No results found for this or any previous visit (from the past 240 hour(s)).   Studies: No results found.  Scheduled Meds: . aspirin  325 mg Oral Daily  . clopidogrel  75 mg Oral Q breakfast  . FLUoxetine  20 mg Oral Daily  . lacosamide  50 mg Oral BID  . levETIRAcetam  500 mg Oral BID  . pantoprazole  40 mg Oral Daily  . risperiDONE  1 mg Oral QHS  .  sodium chloride  3 mL Intravenous Q12H   Continuous Infusions:    Principal Problem:   Acute encephalopathy Active Problems:   Astrocytoma brain tumor   Protein-calorie malnutrition, severe   Seizures   Depression with anxiety   Dysphagia   Altered mental state   Pancytopenia   CKD (chronic kidney disease) stage 3, GFR 30-59 ml/min    Time spent: [facility] Pager [phone]. If 7PM-7AM, please contact night-coverage at [email or web address], password [facility] 07/10/2013, 2:50  PM  LOS: 5 days

## 2013-07-11 DIAGNOSIS — R509 Fever, unspecified: Secondary | ICD-10-CM

## 2013-07-11 LAB — CBC
HEMATOCRIT: 24.5 % — AB (ref 36.0–46.0)
HEMOGLOBIN: 8.3 g/dL — AB (ref 12.0–15.0)
MCH: 30.6 pg (ref 26.0–34.0)
MCHC: 33.9 g/dL (ref 30.0–36.0)
MCV: 90.4 fL (ref 78.0–100.0)
Platelets: 138 10*3/uL — ABNORMAL LOW (ref 150–400)
RBC: 2.71 MIL/uL — ABNORMAL LOW (ref 3.87–5.11)
RDW: 14.6 % (ref 11.5–15.5)
WBC: 3.3 10*3/uL — AB (ref 4.0–10.5)

## 2013-07-11 LAB — BASIC METABOLIC PANEL
BUN: 13 mg/dL (ref 6–23)
CO2: 22 mEq/L (ref 19–32)
CREATININE: 1.03 mg/dL (ref 0.50–1.10)
Calcium: 9.3 mg/dL (ref 8.4–10.5)
Chloride: 108 mEq/L (ref 96–112)
GFR calc non Af Amer: 60 mL/min — ABNORMAL LOW (ref >90)
GFR, EST AFRICAN AMERICAN: 70 mL/min — AB (ref >90)
GLUCOSE: 90 mg/dL (ref 70–99)
POTASSIUM: 4.1 meq/L (ref 3.7–5.3)
Sodium: 141 mEq/L (ref 137–147)

## 2013-07-11 LAB — URINALYSIS, ROUTINE W REFLEX MICROSCOPIC
GLUCOSE, UA: NEGATIVE mg/dL
KETONES UR: NEGATIVE mg/dL
NITRITE: NEGATIVE
Protein, ur: NEGATIVE mg/dL
Specific Gravity, Urine: 1.016 (ref 1.005–1.030)
Urobilinogen, UA: 2 mg/dL — ABNORMAL HIGH (ref 0.0–1.0)
pH: 6 (ref 5.0–8.0)

## 2013-07-11 LAB — URINE MICROSCOPIC-ADD ON

## 2013-07-11 MED ORDER — SALINE SPRAY 0.65 % NA SOLN
1.0000 | NASAL | Status: DC | PRN
  Filled 2013-07-11: qty 44

## 2013-07-11 NOTE — Progress Notes (Signed)
[facility] PROGRESS NOTE  [name] [id number] DOB: February 15, 1961 DOA: 07/05/2013 PCP: No primary provider on file.  Assessment/Plan:   AMS/acute encephalopathy -MRI with sequela of right MCA infarct noted but no acute/new infarct -He'll Await  EEG, UA unremarkable and chest x-ray - no acute infiltrates -Ammonia level within normal limits Psychotic disorder NOS -QTC is borderline at 495 at this time>> Haldol not started, psych followed up and change to risperdal on 2/6  -Although not listed on patient's Epic med rec, paper records from old and living indicate that patient was on Prozac-resumed on 2/5, will continue -Appreciate psychiatry assistance. -No further hallucinations, follow -Continue risperdal History of CVA and SAH -Continue Plavix this patient tolerating by mouth's Astrocytoma brain tumor  Status post surgical resection and radiation therapy in 2012  Protein-calorie malnutrition, severe  Currently n.p.o.  Seizures  Continue Keppra and vimpat Still Await EEG as above and follow Pancytopenia  -Unclear etiology, follow recheck. No gross bleeding -Hemoglobin stable, Lovenox DC'd on 2/7 and placed on SCDs,recheck platelet count in a.m.Marland Kitchen CKD (chronic kidney disease) stage 3, GFR 30-59 ml/min  -recheck in am Fever -Obtain UA, blood cultures -Follow and UA- will obtain chest x-ray  Code Status: Full Family Communication: Father at bedside Disposition Plan: back To SNF when medically ready   Consultants:  Psychiatry  Procedures:  None  Antibiotics:  None   HPI/Subjective: Patient is much more alert today, tolerating by mouth well. Fever noted this a.m.  Objective: Filed Vitals:   07/11/13 1343  BP: 122/84  Pulse: 80  Temp: 99.7 F (37.6 C)  Resp: 18    Intake/Output Summary (Last 24 hours) at 07/11/13 1852 Last data filed at 07/11/13 1850  Gross per 24 hour  Intake 1378.67 ml  Output      0 ml  Net 1378.67 ml   Filed Weights   07/05/13 1840  Weight: 59.2 kg (130 lb 8.2 oz)    Exam:  General: Alert and oriented x3 Cardiovascular: RRR, nl S1 s2 Respiratory: CTAB Abdomen: soft +BS NT/ND, no masses palpable Extremities: No cyanosis and no edema     Data Reviewed: Basic Metabolic Panel:  Recent Labs Lab 07/05/13 1310 07/08/13 0453 07/11/13 0420  NA 142 140 141  K 3.9 3.7 4.1  CL 107 110 108  CO2 24 19 22   GLUCOSE 120* 105* 90  BUN 19 19 13   CREATININE 1.18* 1.17* 1.03  CALCIUM 9.8 8.9 9.3   Liver Function Tests: No results found for this basename: AST, ALT, ALKPHOS, BILITOT, PROT, ALBUMIN,  in the last 168 hours No results found for this basename: LIPASE, AMYLASE,  in the last 168 hours  Recent Labs Lab 07/05/13 1519  AMMONIA 52   CBC:  Recent Labs Lab 07/05/13 1310 07/08/13 0453 07/09/13 0524 07/11/13 0420  WBC 2.8* 3.0* 2.5* 3.3*  NEUTROABS 1.6*  --   --   --   HGB 9.7* 8.0* 8.2* 8.3*  HCT 27.1* 23.4* 23.7* 24.5*  MCV 89.1 90.0 90.5 90.4  PLT 120* 114* 108* 138*   Cardiac Enzymes: No results found for this basename: CKTOTAL, CKMB, CKMBINDEX, TROPONINI,  in the last 168 hours BNP (last 3 results)  Recent Labs  05/25/13 0320  PROBNP 212.1*   CBG: No results found for this basename: GLUCAP,  in the last 168 hours  No results found for this or any previous visit (from the past 240 hour(s)).   Studies: No results found.  Scheduled Meds: . aspirin  325  mg Oral Daily  . clopidogrel  75 mg Oral Q breakfast  . FLUoxetine  20 mg Oral Daily  . lacosamide  50 mg Oral BID  . levETIRAcetam  500 mg Oral BID  . pantoprazole  40 mg Oral Daily  . risperiDONE  1 mg Oral QHS  . sodium chloride  3 mL Intravenous Q12H   Continuous Infusions: . sodium chloride 10 mL/hr at 07/11/13 1413    Principal Problem:   Acute encephalopathy Active Problems:   Astrocytoma brain tumor   Protein-calorie malnutrition, severe   Seizures   Depression with anxiety   Dysphagia   Altered  mental state   Pancytopenia   CKD (chronic kidney disease) stage 3, GFR 30-59 ml/min    Time spent: [place] [facility] Pager [phone]. If 7PM-7AM, please contact night-coverage at [email or web address], password [email or web address] [facility] 07/11/2013, 6:52 PM  LOS: 6 days

## 2013-07-11 NOTE — Progress Notes (Signed)
Clinical Social Work  CSW attempted to meet with patient at bedside but patient in the middle of a procedure. CSW will continue to follow and will assist with transfer back to SNF once medically stable.   [place], [place] [phone]

## 2013-07-12 DIAGNOSIS — G9341 Metabolic encephalopathy: Secondary | ICD-10-CM | POA: Diagnosis not present

## 2013-07-12 DIAGNOSIS — R569 Unspecified convulsions: Secondary | ICD-10-CM | POA: Diagnosis not present

## 2013-07-12 DIAGNOSIS — R4182 Altered mental status, unspecified: Secondary | ICD-10-CM | POA: Diagnosis not present

## 2013-07-12 DIAGNOSIS — N390 Urinary tract infection, site not specified: Secondary | ICD-10-CM

## 2013-07-12 DIAGNOSIS — C719 Malignant neoplasm of brain, unspecified: Secondary | ICD-10-CM | POA: Diagnosis not present

## 2013-07-12 DIAGNOSIS — G9340 Encephalopathy, unspecified: Secondary | ICD-10-CM | POA: Diagnosis not present

## 2013-07-12 MED ORDER — VITAMINS A & D EX OINT
TOPICAL_OINTMENT | CUTANEOUS | Status: AC
  Administered 2013-07-12: 1 via ORAL
  Filled 2013-07-12: qty 5

## 2013-07-12 MED ORDER — METHYLPHENIDATE HCL 5 MG PO TABS: ORAL_TABLET | ORAL | Status: DC

## 2013-07-12 MED ORDER — FLUOXETINE HCL 20 MG PO CAPS: 20.0000 mg | ORAL_CAPSULE | Freq: Every day | ORAL | Status: DC

## 2013-07-12 MED ORDER — DEXTROSE 5 % IV SOLN
1.0000 g | INTRAVENOUS | Status: AC
  Administered 2013-07-12: 1 g via INTRAVENOUS
  Filled 2013-07-12: qty 10

## 2013-07-12 MED ORDER — RISPERIDONE 1 MG PO TABS: 1.0000 mg | ORAL_TABLET | Freq: Every day | ORAL | Status: DC

## 2013-07-12 MED ORDER — CEFUROXIME AXETIL 500 MG PO TABS: 500.0000 mg | ORAL_TABLET | Freq: Two times a day (BID) | ORAL | Status: DC

## 2013-07-12 MED ORDER — DEXTROSE 5 % IV SOLN
1.0000 g | INTRAVENOUS | Status: DC
  Filled 2013-07-12: qty 10

## 2013-07-12 NOTE — Progress Notes (Signed)
Clinical Social Work  CSW faxed DC summary to [facility] who is agreeable to accept patient back today. CSW prepared DC packet with FL2 and hard scripts included. CSW informed patient, husband, and dad of DC plans and all parties agreeable. Patient prefers [facility] due to mobility status and finds it difficult to get in and out of the car. Patient is hopeful that family will come and visit her soon. CSW coordinated transportation via [facility]. Request #: [id number].  CSW is signing off but available if needed.  [place], [place] [phone]

## 2013-07-12 NOTE — Discharge Summary (Signed)
Physician Discharge Summary  [name] JJO:[id number] DOB: 1960/12/03 DOA: 07/05/2013  PCP: No primary provider on file.  Admit date: 07/05/2013 Discharge date: 07/12/2013  Time spent: >30 minutes  Recommendations for Outpatient Follow-up:  Follow-up Information   Please follow up. (SNF MD in 1-2days)       Please follow up. (Outpt psychiatrist, call for appt upon discharge)      Pending studies-nursing home to followup -Urine culture -2/5 EEG report  Discharge Diagnoses:  Principal Problem:   Acute encephalopathy Active Problems:   Astrocytoma brain tumor   Protein-calorie malnutrition, severe   Seizures   Depression with anxiety   Dysphagia   Altered mental state   Pancytopenia   CKD (chronic kidney disease) stage 3, GFR 30-59 ml/min   Discharge Condition: Improved/stable  Diet recommendation: Heart healthy diet  Filed Weights   07/05/13 1840  Weight: 59.2 kg (130 lb 8.2 oz)    History of present illness:  History obtained from patient's Nurse Nira Conn at [facility] and ED physician.  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 Patient was admitted to the hospital 6 weeks back for encephalopathy with increased lethargy and had a negative CVA workup and EEG. She was started on Keppra and vimpat for seizures as per neurology. She was also seen by psychiatry for severe depression and started on antidepressants. Patient was admitted prior to that in November 2014 for [facility] with findings of right posterior temporal stroke and had pipeline embolization of the RICA aneurysm.  The patient at baseline is pleasantly confused and is oriented mainly to  person and is able to carry out simple conversation.. Patient for last 2 days has been increasingly anxious, agitated, yelling at staffs, having delusions ( saying people were stealing things from her room, cutting her and raping her), yelling that everyone's conspiring and covering up the evidence. Patient did not have any fever, chills, SOB, nausea, vomiting, or witnessed seizures, lethargy or loss of consciousness. No history of fall, recent illness or change in her medications. Patient at baseline is incontinent of urine and bowel. She was given 5 mg IM haldol then went off to sleep.  In the ED her vitals were stable. Blood work showed mild pancytopenia with WBC of 2.8, hemoglobin of 9.7, platelets of 120. Chemistry was normal except for creatinine of 1.18. Blood glucose was 120. A chest x-ray done was unremarkable. Head CT done and showed stable encephalomalacia of the right frontal and temporal lobes, right ICA stenting and no acute findings. She was admitted for further evaluation and management.     Hospital Course:  AMS/acute encephalopathy  -As discussed above upon admission the patient had a CT scan of her head which showed stable encephalomalacia of the right frontal and temporal lobes, right ICA stenting in no acute findings. MRI was ordered and show with sequela of right MCA infarct noted but no acute/new infarct  -An EEG ordered the results pending at this time, she was maintained on her outpatient antiepileptics and did not have any further seizures the. UA on admission was unremarkable>> patient had temp spike. in the hospital and repeat urinalysis was consistent with a UTI. She was treated with antibiotics and has remained  afebrile. chest x-ray - no acute infiltrates  -Ammonia level within normal limits  Psychotic disorder NOS  -Psychiatry was consulted and saw patient in the hospital and the impression was that she had  psychotic disorder NOS -Haldol was initially recommended but QTC  is borderline at 495 at this time>> Haldol not started, psych followed up and changed to risperdal on 2/6  -Although not listed on patient's Epic med rec, paper records from [facility] indicate that patient was on Prozac-resumed on 2/5 and she is to continue this upon discharge  Patient was maintained on on these medications in the hospital and she did not have any further hallucinations or agitation -Heis to followup with nursing home M.D. and with psychiatry outpatient  History of CVA and [place]  -Patient was maintained on Plavix and she is to continue this upon discharge. I was done and the results as discussed above. Astrocytoma brain tumor  Status post surgical resection and radiation therapy in 2012  Protein-calorie malnutrition, severe  Currently n.p.o.  Seizures  She was maintained on Keppra and vimpat  -EEG report is pending at this time, she has remained seizure free in the hospital, nursing home MD to follow up results outpatient.  Pancytopenia  -Unclear etiology, follow recheck. No gross bleeding  -Hemoglobin stable, Lovenox DC'd on 2/7 and placed on SCDs,recheck platelet count improved to 138 on 2/9  CKD (chronic kidney disease) stage 3, GFR 30-59 ml/min  -Stable, last creatinine 1.03  Fever/probable UTI  Patient had fever in the hospital and repeat urinalysis as above was consistent with UTI. She was started on empiric antibiotics with Rocephin. Urine cultures and pending his to followup with nursing home M.D. She'll be discharged on Ceftin.   Procedures:  EEG pending  Consultations:  Psychiatry  Discharge Exam: Filed Vitals:   07/12/13 1414  BP: 99/65  Pulse: 62  Temp: 98 F (36.7 C)  Resp: 18   Exam:  General: Alert and oriented x3  Cardiovascular: RRR, nl S1 s2  Respiratory: CTAB  Abdomen: soft +BS NT/ND, no masses palpable  Extremities: No cyanosis and no edema     Discharge Instructions  Discharge Orders   Future Appointments Provider Department  Dept Phone   08/19/2013 12:20 PM Meredith Staggers, MD [facility] [phone]   Future Orders Complete By Expires   Diet - low sodium heart healthy  As directed    Increase activity slowly  As directed        Medication List         aspirin 325 MG EC tablet  Take 325 mg by mouth daily.     clopidogrel 75 MG tablet  Commonly known as:  PLAVIX  Take 75 mg by mouth daily with breakfast.     FLUoxetine 20 MG capsule  Commonly known as:  PROZAC  Take 1 capsule (20 mg total) by mouth daily.     food thickener Powd  Commonly known as:  THICK IT  Take 1 g by mouth as needed (dietary use).     lacosamide 50 MG Tabs tablet  Commonly known as:  VIMPAT  Take 1 tablet (50 mg total) by mouth 2 (two) times daily.     levETIRAcetam 500 MG tablet  Commonly known as:  KEPPRA  Take 1 tablet (500 mg total) by mouth every 12 (twelve) hours.     methylphenidate 5 MG tablet  Commonly known as:  RITALIN  Take one tablet by mouth twice daily  multivitamin tablet  Take 1 tablet by mouth daily.     pantoprazole 40 MG tablet  Commonly known as:  PROTONIX  Take 40 mg by mouth daily.     risperiDONE 1 MG tablet  Commonly known as:  RISPERDAL  Take 1 tablet (1 mg total) by mouth at bedtime.       Ceftin 500 mg 1 by mouth twice a day for 6 more days. Allergies  Allergen Reactions  . Vasotec Other (See Comments)    Causes angioedema       Follow-up Information   Please follow up. (SNF MD in 1-2days)       Please follow up. ([facility] psychiatrist, call for appt upon discharge)        The results of significant diagnostics from this hospitalization (including imaging, microbiology, ancillary and laboratory) are listed below for reference.    Significant Diagnostic Studies: Ct Head Wo Contrast  07/05/2013   CLINICAL DATA:  Delusions an aggressive behavior. Recent parenchymal intracranial hemorrhage  EXAM: CT HEAD WITHOUT CONTRAST  TECHNIQUE:  Contiguous axial images were obtained from the base of the skull through the vertex without intravenous contrast.  COMPARISON:  MRI brain 05/25/2013  FINDINGS: A 4 cm cystic lesion within the right parietal lobe again demonstrates a fluid level. Surrounding encephalomalacia in the right parietal and temporal lobe is stable. Infarcted territory is more anteriorly in the right frontal lobe are also stable. Diffuse white matter changes similar prior exam. Ex vacuo dilation of the right lateral ventricle is noted. A layering extra-axial collection is similar to the prior exam.  No new or expanding hemorrhage is present. Mild periventricular white matter changes are evident, as before. No significant extra-axial fluid collection is present on the left. A right-sided pipeline stent is in place.  The right parietal craniotomy is again noted. The paranasal sinuses and mastoid air cells are clear. The osseous skull is otherwise intact.  IMPRESSION: 1. No significant interval change. 2. Stable cystic lesion with a hematocrit level in the right parietal lobe. 3. Stable encephalomalacia of the right frontal and temporal lobes. 4. Stable asymmetric white matter disease with ex vacuo dilation of the right lateral ventricle. 5. Right ICA stenting. 6. Stable layering right extra-axial fluid collection without evidence for acute hemorrhage or expansion.   Electronically Signed   By: Lawrence Santiago M.D.   On: 07/05/2013 13:31   Mr Virgel Paling Wo Contrast  07/05/2013   CLINICAL DATA:  53 year old female with altered mental status, confusion, agitation, acute encephalopathy. Initial encounter. History of astrocytoma resection and radiation therapy several years ago. More recently, the patient suffered right MCA infarct, and right subdural and subarachnoid hemorrhage (November 2014). The patient also underwent intracranial right ICA aneurysm endovascular treatment (pipeline stent) in December 2014.  EXAM: MRI HEAD WITHOUT CONTRAST  MRA  HEAD WITHOUT CONTRAST  TECHNIQUE: Multiplanar, multiecho pulse sequences of the brain and surrounding structures were obtained without intravenous contrast. Angiographic images of the head were obtained using MRA technique without contrast.  COMPARISON:  Head CT 07/05/2013.  Brain MRI 05/25/2013 and earlier.  FINDINGS: MRI HEAD FINDINGS  Major intracranial vascular flow voids are stable. Mild susceptibility artifact related to the supra clinoid right ICA stent. Persistent restricted diffusion along the gyrus superior to the chronic post treatment cavity in the posterior right hemisphere. The configuration of this diffusion abnormality is unchanged since 05/25/2013.  No associated leftward midline shift. Mildly increased ex vacuo enlargement of the ventricles since December.  Confluent abnormal  FLAIR and T2 signal in the right hemisphere is stable since December. Superimposed evolving encephalomalacia in the right MCA territory with intrinsic T1 laminar necrosis intermittently noted. Stable small residual right side extra-axial collection. FLAIR signal in the high left parietal lobe has increased since the prior studies (series 7, image 19 today versus series 7, image 21 on 05/25/2013, series 11, image 20 on 04/1913). Diffusion here is facilitated. This is not contiguous with the abnormal right hemisphere signal; the corpus callosum signal remains normal.  The cystic resection cavity is stable in size and configuration since December. There is slightly less layering dark T2 fluid within the cavity (series 6, image 17). Decreased T2 * signal in keeping with hemosiderin is re- identified along the right hemisphere sulci, and along the margins of the resection cavity, and stable.  Negative pituitary, cervicomedullary junction and visualized cervical spine. Bone marrow signal is stable and within normal limits. Stable orbits soft tissues. Stable paranasal sinuses and mastoids, fairly well pneumatized. Visible internal  auditory structures appear normal. No acute scalp soft tissue findings.  MRA HEAD FINDINGS  Improved antegrade flow in the posterior circulation since 04/20/13, resolved midbasilar vasospasm. Codominant distal vertebral arteries remain patent. Patent vertebrobasilar junction. Patent AICA vessels. No basilar stenosis. SCA and PCA origins are within normal limits. Significantly improved bilateral PCA branches, now appearing normal. Posterior communicating arteries are diminutive or absent.  Stable antegrade flow in both ICA siphons. On the left there is no ICA stenosis. The left ICA terminus, MCA and ACA origins are normal.  On the right there is persistent persistent increased MRA signal in the bilobed right ICA terminus aneurysm (series 9 images 90-95, and series 903, image 13). However, the aneurysm also demonstrates intrinsic T1 signal on axial series 11 (images 17 and 18). The size of each aneurysm lobe has mildly decreased since 04/20/13 (4 mm anterior lobe, 6-7 mm posterior lobe - versus 6 and 8 mm respectively).  The right ICA terminus remains patent. The right MCA and ACA origins remain patent.  Anterior communicating artery and visualized ACA branches demonstrate improved appearance since November. Bilateral MCA branches demonstrate improved appearance since November. No major MCA or ACA branch occlusion is identified.  IMPRESSION: HEAD MRI IMPRESSION:  1. Sequelae of right MCA infarct, with persistent but unchanged diffusion abnormality in the gyrus superior to the chronic right hemisphere treatment cavity. Significance is unclear, but favor this is sequelae of the November ischemia, rather than new infarct. 2. 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 3. No significant change in the right hemisphere resection cavity itself, and the surrounding right hemisphere T2 and FLAIR hyperintensity  is stable. 4. There is increased left parietal lobe white matter T2 and FLAIR hyperintensity, the etiology and significance unknown. The corpus callosum signal remains normal. HEAD MRA IMPRESSION:  1. Persistent signal in the lobulated right ICA terminus aneurysm, but most likely representing stasis/thrombosis within the aneurysm rather than persistent flow (discussed above). Overall aneurysm size has mildly diminished compared to the pre treatment study. 2. Otherwise significantly improved appearance of major intracranial arteries compared to the November study, at which time severe intracranial vasospasm was noted. MRA findings discussed by telephone with Dr. Kathyrn Sheriff (Neurosurgery) on 07/05/2013 at 21:26 .   Electronically Signed   By: Lars Pinks M.D.   On: 07/05/2013 21:32   Mr Brain Wo Contrast  07/05/2013   CLINICAL DATA:  53 year old female  with altered mental status, confusion, agitation, acute encephalopathy. Initial encounter. History of astrocytoma resection and radiation therapy several years ago. More recently, the patient suffered right MCA infarct, and right subdural and subarachnoid hemorrhage (November 2014). The patient also underwent intracranial right ICA aneurysm endovascular treatment (pipeline stent) in December 2014.  EXAM: MRI HEAD WITHOUT CONTRAST  MRA HEAD WITHOUT CONTRAST  TECHNIQUE: Multiplanar, multiecho pulse sequences of the brain and surrounding structures were obtained without intravenous contrast. Angiographic images of the head were obtained using MRA technique without contrast.  COMPARISON:  Head CT 07/05/2013.  Brain MRI 05/25/2013 and earlier.  FINDINGS: MRI HEAD FINDINGS  Major intracranial vascular flow voids are stable. Mild susceptibility artifact related to the supra clinoid right ICA stent. Persistent restricted diffusion along the gyrus superior to the chronic post treatment cavity in the posterior right hemisphere. The configuration of this diffusion abnormality is  unchanged since 05/25/2013.  No associated leftward midline shift. Mildly increased ex vacuo enlargement of the ventricles since December.  Confluent abnormal FLAIR and T2 signal in the right hemisphere is stable since December. Superimposed evolving encephalomalacia in the right MCA territory with intrinsic T1 laminar necrosis intermittently noted. Stable small residual right side extra-axial collection. FLAIR signal in the high left parietal lobe has increased since the prior studies (series 7, image 19 today versus series 7, image 21 on 05/25/2013, series 11, image 20 on 04/1913). Diffusion here is facilitated. This is not contiguous with the abnormal right hemisphere signal; the corpus callosum signal remains normal.  The cystic resection cavity is stable in size and configuration since December. There is slightly less layering dark T2 fluid within the cavity (series 6, image 17). Decreased T2 * signal in keeping with hemosiderin is re- identified along the right hemisphere sulci, and along the margins of the resection cavity, and stable.  Negative pituitary, cervicomedullary junction and visualized cervical spine. Bone marrow signal is stable and within normal limits. Stable orbits soft tissues. Stable paranasal sinuses and mastoids, fairly well pneumatized. Visible internal auditory structures appear normal. No acute scalp soft tissue findings.  MRA HEAD FINDINGS  Improved antegrade flow in the posterior circulation since 04/20/13, resolved midbasilar vasospasm. Codominant distal vertebral arteries remain patent. Patent vertebrobasilar junction. Patent AICA vessels. No basilar stenosis. SCA and PCA origins are within normal limits. Significantly improved bilateral PCA branches, now appearing normal. Posterior communicating arteries are diminutive or absent.  Stable antegrade flow in both ICA siphons. On the left there is no ICA stenosis. The left ICA terminus, MCA and ACA origins are normal.  On the right  there is persistent persistent increased MRA signal in the bilobed right ICA terminus aneurysm (series 9 images 90-95, and series 903, image 13). However, the aneurysm also demonstrates intrinsic T1 signal on axial series 11 (images 17 and 18). The size of each aneurysm lobe has mildly decreased since 04/20/13 (4 mm anterior lobe, 6-7 mm posterior lobe - versus 6 and 8 mm respectively).  The right ICA terminus remains patent. The right MCA and ACA origins remain patent.  Anterior communicating artery and visualized ACA branches demonstrate improved appearance since November. Bilateral MCA branches demonstrate improved appearance since November. No major MCA or ACA branch occlusion is identified.  IMPRESSION: HEAD MRI IMPRESSION:  1. Sequelae of right MCA infarct, with persistent but unchanged diffusion abnormality in the gyrus superior to the chronic right hemisphere treatment cavity. Significance is unclear, but favor this is sequelae of the November ischemia, rather than new infarct. 2. 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 3. No significant change in the right hemisphere resection cavity itself, and the surrounding right hemisphere T2 and FLAIR hyperintensity is stable. 4. There is increased left parietal lobe white matter T2 and FLAIR hyperintensity, the etiology and significance unknown. The corpus callosum signal remains normal. HEAD MRA IMPRESSION:  1. Persistent signal in the lobulated right ICA terminus aneurysm, but most likely representing stasis/thrombosis within the aneurysm rather than persistent flow (discussed above). Overall aneurysm size has mildly diminished compared to the pre treatment study. 2. Otherwise significantly improved appearance of major intracranial arteries compared to the November study, at which time severe intracranial vasospasm was noted. MRA findings  discussed by telephone with Dr. Kathyrn Sheriff (Neurosurgery) on 07/05/2013 at 21:26 .   Electronically Signed   By: Lars Pinks M.D.   On: 07/05/2013 21:32   Dg Chest Port 1 View  07/05/2013   CLINICAL DATA:  Patient unresponsive  EXAM: PORTABLE CHEST - 1 VIEW  COMPARISON:  05/24/2013  FINDINGS: The heart size and mediastinal contours are within normal limits. Both lungs are clear. The visualized skeletal structures are unremarkable.  IMPRESSION: No active disease.   Electronically Signed   By: Skipper Cliche M.D.   On: 07/05/2013 13:57    Microbiology: Recent Results (from the past 240 hour(s))  CULTURE, BLOOD (ROUTINE X 2)     Status: None   Collection Time    07/11/13  2:00 PM      Result Value Range Status   Specimen Description BLOOD RIGHT HAND   Final   Special Requests BOTTLES DRAWN AEROBIC AND ANAEROBIC 10CC]   Final   Culture  Setup Time     Final   Value: 07/11/2013 20:02     Performed at [facility]   Culture     Final   Value:        BLOOD CULTURE RECEIVED NO GROWTH TO DATE CULTURE WILL BE HELD FOR 5 DAYS BEFORE ISSUING A FINAL NEGATIVE REPORT     Performed at [facility]   Report Status PENDING   Incomplete  CULTURE, BLOOD (ROUTINE X 2)     Status: None   Collection Time    07/11/13  2:20 PM      Result Value Range Status   Specimen Description BLOOD RIGHT ARM   Final   Special Requests BOTTLES DRAWN AEROBIC AND ANAEROBIC 10CC   Final   Culture  Setup Time     Final   Value: 07/11/2013 20:02     Performed at [facility]   Culture     Final   Value:        BLOOD CULTURE RECEIVED NO GROWTH TO DATE CULTURE WILL BE HELD FOR 5 DAYS BEFORE ISSUING A FINAL NEGATIVE REPORT     Performed at [facility]   Report Status PENDING   Incomplete     Labs: Basic Metabolic Panel:  Recent Labs Lab 07/08/13 0453 07/11/13 0420  NA 140 141  K 3.7 4.1  CL 110 108  CO2 19 22  GLUCOSE 105* 90  BUN 19 13  CREATININE 1.17* 1.03  CALCIUM 8.9 9.3    Liver Function Tests: No results found for this basename: AST, ALT, ALKPHOS, BILITOT, PROT, ALBUMIN,  in the last 168 hours No results found for this basename: LIPASE, AMYLASE,  in the last 168 hours  Recent Labs  Lab 07/05/13 1519  AMMONIA 52   CBC:  Recent Labs Lab 07/08/13 0453 07/09/13 0524 07/11/13 0420  WBC 3.0* 2.5* 3.3*  HGB 8.0* 8.2* 8.3*  HCT 23.4* 23.7* 24.5*  MCV 90.0 90.5 90.4  PLT 114* 108* 138*   Cardiac Enzymes: No results found for this basename: CKTOTAL, CKMB, CKMBINDEX, TROPONINI,  in the last 168 hours BNP: BNP (last 3 results)  Recent Labs  05/25/13 0320  PROBNP 212.1*   CBG: No results found for this basename: GLUCAP,  in the last 168 hours     Signed:  Sheila Oats  [facility] 07/12/2013, 2:46 PM

## 2013-07-12 NOTE — Progress Notes (Signed)
Patient alert and oriented, discharge to SNF, transferred by Medical Transportation, discharge package given to transporter for delivery to SNF, patient active member of My Chart, patient in stable condition at this time

## 2013-07-12 NOTE — Procedures (Signed)
ELECTROENCEPHALOGRAM REPORT  Patient: [name]       Room #: 1093 EEG No. ID: [id number] Age: 53 y.o.        Sex: female Referring Physician: Dillard Essex Report Date:  07/12/2013        Interpreting Physician: Anthony Sar  History: [name] is an 53 y.o. female history of right parietal tumor resection and radiation therapy as well as history of seizure disorder and aneurysm, admitted with mental status changes with increasing confusion and combativeness.  Indications for study:  Assess severity of encephalopathy; rule out vocal seizure activity.  Technique: This is an 18 channel routine scalp EEG performed at the bedside with bipolar and monopolar montages arranged in accordance to the international 10/20 system of electrode placement.   Description: This EEG recording was performed during wakefulness. The background activity consisted of diffuse low to moderate amplitude mixed irregular delta and theta activity continuously. They activity was slightly rhythmic posteriorly. Photic stimulation was not performed. Hyperventilation was not performed. No epileptiform discharges were recorded.  Interpretation: This EEG is abnormal with moderately severe generalized nonspecific continuous slowing of cerebral activity. This pattern of slowing can be seen with metabolic as well as degenerative and toxic encephalopathies. No epileptiform activity was seen during this recording period   Rush Farmer M.D. [facility] [phone]

## 2013-07-13 ENCOUNTER — Non-Acute Institutional Stay (SKILLED_NURSING_FACILITY): Payer: PRIVATE HEALTH INSURANCE | Admitting: Internal Medicine

## 2013-07-13 ENCOUNTER — Encounter: Payer: Self-pay | Admitting: Internal Medicine

## 2013-07-13 DIAGNOSIS — G8114 Spastic hemiplegia affecting left nondominant side: Secondary | ICD-10-CM

## 2013-07-13 DIAGNOSIS — F418 Other specified anxiety disorders: Secondary | ICD-10-CM

## 2013-07-13 DIAGNOSIS — C719 Malignant neoplasm of brain, unspecified: Secondary | ICD-10-CM

## 2013-07-13 DIAGNOSIS — E43 Unspecified severe protein-calorie malnutrition: Secondary | ICD-10-CM

## 2013-07-13 DIAGNOSIS — D61818 Other pancytopenia: Secondary | ICD-10-CM

## 2013-07-13 DIAGNOSIS — G8110 Spastic hemiplegia affecting unspecified side: Secondary | ICD-10-CM

## 2013-07-13 DIAGNOSIS — R1310 Dysphagia, unspecified: Secondary | ICD-10-CM

## 2013-07-13 DIAGNOSIS — N183 Chronic kidney disease, stage 3 unspecified: Secondary | ICD-10-CM

## 2013-07-13 DIAGNOSIS — I671 Cerebral aneurysm, nonruptured: Secondary | ICD-10-CM

## 2013-07-13 DIAGNOSIS — G9340 Encephalopathy, unspecified: Secondary | ICD-10-CM

## 2013-07-13 DIAGNOSIS — R569 Unspecified convulsions: Secondary | ICD-10-CM

## 2013-07-13 DIAGNOSIS — F29 Unspecified psychosis not due to a substance or known physiological condition: Secondary | ICD-10-CM

## 2013-07-13 DIAGNOSIS — F341 Dysthymic disorder: Secondary | ICD-10-CM

## 2013-07-13 DIAGNOSIS — I609 Nontraumatic subarachnoid hemorrhage, unspecified: Secondary | ICD-10-CM

## 2013-07-13 LAB — URINE CULTURE: Colony Count: >=100000

## 2013-07-13 NOTE — Progress Notes (Signed)
Patient ID: [name], female   DOB: Mar 30, 1961, 53 y.o.   MRN: [id number]  Provider:  Rexene Edison. Akio Hudnall, D.O. Location:  [facility]  PCP: Hollace Kinnier, DODr. Mariea Clonts while at [facility] Status: full code  Allergies  Allergen Reactions  . Vasotec Other (See Comments)    Causes angioedema    Chief Complaint  Patient presents with  . Hospitalization Follow-up    readmission s/p hospitalization    HPI: 53 y.o. 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  She is now here again for rehab s/p discharge from [facility] admission from 2/03-2/10/15 where she was admitted for acute mental status change and aggressive behavior with a diagnosis of Acute Encephalopathy.  Chest x-ray was negative. Head CT with stable encephalomalacia of right frontal and temporal lobes.  MRI of head with sequela of right MCA infarct, but no acute or new infarct. EEG abnormal showing moderately severe generalized nonspecific continuous slowing of cerebral activity, which can be seen with metabolic as well as degenerative and toxic encephalopathies; and no epileptiform activity seen per report.  Was negative for UTI on admission but developed one during stay so treament initiated with Ceftin antibiotic.  Psychiatry consulted with impression Psychotic Disorder [facility].  Started Risperdal on 2/06, and continued Prozac as already ordered.  Haldol not started due to QTc borderline at 495.  After starting Risperdal, pt did not have any more hallucinations or aggressive behaviors in hospital.  Discharged back to [facility] on 2/10 with the recommendation of outpatient Psychiatry follow-up.   When seen, she remained incredibly confused, was unable to really answer any questions appropriately  frequently saying things that had nothing to do with the question asked.  She did mention that her hemorrhoids were bothering her.  ROS: Review of Systems  Unable to perform ROS: medical condition     Past Medical History  Diagnosis Date  . 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 Fracture, ankle 2012    right  . History of radiation therapy 09/23/10- 11/07/10    right parietal lobe  . Chronic headaches     uses oxycodone appropriately  . Anxiety   . Muscle spasticity   . Seizures   . Dysphagia    Past Surgical History  Procedure Laterality Date  . Orif ankle fracture  08/22/2010    right  . Radiology with anesthesia N/A 05/02/2013    Procedure: RADIOLOGY WITH ANESTHESIA;  Surgeon: Consuella Lose, MD;  Location: [facility];  Service: Radiology;  Laterality: N/A;  . Brain surgery Right 2012    astrocytoma R parietal lobe   Social History:   reports that she has never smoked. She has never used smokeless tobacco. She reports that she drinks alcohol. She reports that she does not use illicit drugs.  Family History  Problem Relation Age of Onset  . Breast cancer Mother     Medications: Patient's Medications  New Prescriptions   No medications on file  Previous Medications   ASPIRIN 325 MG EC TABLET    Take 325 mg by mouth daily.   CEFUROXIME (CEFTIN) 500 MG TABLET    Take 1 tablet (500 mg total) by mouth 2 (two) times daily with a meal.   CLOPIDOGREL (PLAVIX) 75 MG TABLET  Take 75 mg by mouth daily with breakfast.   FLUOXETINE (PROZAC) 20 MG CAPSULE    Take 1 capsule (20 mg total) by mouth daily.   FOOD THICKENER (THICK IT) POWD    Take 1 g by mouth as needed (dietary use).   LACOSAMIDE (VIMPAT) 50 MG TABS TABLET    Take 1 tablet (50 mg total) by mouth 2 (two) times daily.   LEVETIRACETAM (KEPPRA) 500 MG TABLET    Take 1 tablet (500 mg total) by mouth every 12 (twelve) hours.   METHYLPHENIDATE (RITALIN) 5 MG TABLET    Take one tablet by mouth twice daily     MULTIPLE VITAMIN (MULTIVITAMIN) TABLET    Take 1 tablet by mouth daily.     PANTOPRAZOLE (PROTONIX) 40 MG TABLET    Take 40 mg by mouth daily.   RISPERIDONE (RISPERDAL) 1 MG TABLET    Take 1 tablet (1 mg total) by mouth at bedtime.  Modified Medications   No medications on file  Discontinued Medications   No medications on file     Physical Exam: Filed Vitals:   07/13/13 1012  BP: 129/72  Pulse: 68  Temp: 98.9 F (37.2 C)  Resp: 22  Height: 5\' 4"  (1.626 m)  Weight: 142 lb (64.411 kg)  SpO2: 98%  Physical Exam  Constitutional: No distress.  White female, nad resting in bed, only concern is getting cleaned up  HENT:  Right Ear: External ear normal.  Left Ear: External ear normal.  Nose: Nose normal.  Mouth/Throat: Oropharynx is clear and moist. No oropharyngeal exudate.  Eyes: Conjunctivae are normal. Pupils are equal, round, and reactive to light.  Neck: Normal range of motion. Neck supple. No JVD present.  Cardiovascular: Normal rate, regular rhythm, normal heart sounds and intact distal pulses.   Pulmonary/Chest: Effort normal and breath sounds normal. No respiratory distress.  Abdominal: Soft. Bowel sounds are normal. She exhibits distension. She exhibits no mass. There is no tenderness.  Musculoskeletal: She exhibits no edema and no tenderness.  Neurological: She is alert. She displays abnormal reflex. She exhibits abnormal muscle tone.  Spastic left hemiparesis;  Not able to answer appropriately to determine orientation  Skin: Skin is warm and dry.  Multiple small bruises cover her arms and legs  Psychiatric:  Very confused, perseverative, not speaking logically or answering questions appropriately, alert though    Labs reviewed: Basic Metabolic Panel:  Recent Labs  05/27/13 0505 05/28/13 0603 05/29/13 0728  07/05/13 1310 07/08/13 0453 07/11/13 0420  NA 144 142 139  < > 142 140 141  K 3.8 4.1 4.9  < > 3.9 3.7 4.1  CL 113* 113* 111  < > 107 110 108  CO2  23 22 18*  < > 24 19 22   GLUCOSE 137* 130* 134*  < > 120* 105* 90  BUN 21 19 18   < > 19 19 13   CREATININE 1.18* 1.17* 1.22*  < > 1.18* 1.17* 1.03  CALCIUM 10.2 10.2 9.7  < > 9.8 8.9 9.3  MG 1.7 1.7 1.8  --   --   --   --   < > = values in this interval not displayed. Liver Function Tests:  Recent Labs  05/27/13 0505 05/28/13 0603 05/29/13 0728  AST 28 32 30  ALT 34 38* 34  ALKPHOS 100 104 119*  BILITOT 0.7 0.6 0.5  PROT 5.7* 5.6* 5.5*  ALBUMIN 3.0* 2.9* 2.7*    Recent Labs  05/24/13 2010 07/05/13 1519  AMMONIA 63* 52   CBC:  Recent Labs  05/28/13 0603 05/29/13 0810 07/05/13 1310 07/08/13 0453 07/09/13 0524 07/11/13 0420  WBC 4.4 5.5 2.8* 3.0* 2.5* 3.3*  NEUTROABS 3.0 3.3 1.6*  --   --   --   HGB 10.0* 10.5* 9.7* 8.0* 8.2* 8.3*  HCT 29.5* 30.3* 27.1* 23.4* 23.7* 24.5*  MCV 92.2 90.7 89.1 90.0 90.5 90.4  PLT 182 178 120* 114* 108* 138*  Imaging and Procedures: 07/05/13:  CT brain:  1. No significant interval change.  2. Stable cystic lesion with a hematocrit level in the right  parietal lobe.  3. Stable encephalomalacia of the right frontal and temporal lobes.  4. Stable asymmetric white matter disease with ex vacuo dilation of  the right lateral ventricle.  5. Right ICA stenting.  6. Stable layering right extra-axial fluid collection without  evidence for acute hemorrhage or expansion.  07/05/13:  CXR:  No active disease.  07/05/13:  HEAD MRI IMPRESSION:  1. Sequelae of right MCA infarct, with persistent but unchanged  diffusion abnormality in the gyrus superior to the chronic right  hemisphere treatment cavity. Significance is unclear, but favor this  is sequelae of the November ischemia, rather than new infarct.  2. 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  3. No significant change in the right hemisphere resection cavity  itself, and  the surrounding right hemisphere T2 and FLAIR  hyperintensity is stable.  4. There is increased left parietal lobe white matter T2 and FLAIR  hyperintensity, the etiology and significance unknown. The corpus  callosum signal remains normal.   07/05/13:  HEAD MRA IMPRESSION:  1. Persistent signal in the lobulated right ICA terminus aneurysm,  but most likely representing stasis/thrombosis within the aneurysm  rather than persistent flow (discussed above). Overall aneurysm size  has mildly diminished compared to the pre treatment study.  2. Otherwise significantly improved appearance of major intracranial  arteries compared to the November study, at which time severe  intracranial vasospasm was noted.  MRA findings discussed by telephone with Dr. Kathyrn Sheriff  (Neurosurgery) on 07/05/2013 at 21:26 .  Assessment/Plan Cerebral aneurysm, nonruptured Again noted on her brain imaging during delirium workup, cont to monitor  Dysphagia To work with [place] while here.    Acute encephalopathy Seems this has not resolved, but I have not been the physician following her prior to this stay.  She remains perseverative and not at all appropriate with her responses.  Difficult to tell what her mental baseline is after several neurologic insults.  Seems her psychosis has resolved though with the use of risperdal.  I would question encephalitis in this lady if I knew for sure she was not back to her baseline.  ? If LP should be done if this persists.  Unfortunately, she has the tumor history and SAH history so this may be at least relatively contraindicated.  Is completing ceftin therapy.    Astrocytoma brain tumor No evidence of growth/recurrence on imaging.  Mentation quite poor.  Cont to monitor  Left spastic hemiparesis Not currently on any meds for her spasticity.  To work with PT, OT.  Will monitor for needs.    SAH (subarachnoid hemorrhage) In the past with right mca infarct on MRI. Here for  therapy.  CKD (chronic kidney disease) stage 3, GFR 30-59 ml/min F/u bmp next draw.  Pancytopenia F/u cbc next draw.  Depression with anxiety Cont risperdal, ritalin, and prozac.  To be followed by [facility] here.  Psychosis Cont risperdal right now.  Maybe able to taper off gradually.   Functional status:  Dependent in adls at this time  Family/ staff Communication: discussed with pt's nurse   Labs/tests ordered:  Cbc, bmp next draw

## 2013-07-13 NOTE — Progress Notes (Deleted)
Subjective:     Patient ID: [name], female   DOB: 24-Dec-1960, 53 y.o.   MRN: [id number]  HPI   Review of Systems     Objective:   Physical Exam     Assessment:     ***    Plan:     ***

## 2013-07-14 DIAGNOSIS — G8114 Spastic hemiplegia affecting left nondominant side: Secondary | ICD-10-CM | POA: Insufficient documentation

## 2013-07-14 NOTE — Assessment & Plan Note (Signed)
Again noted on her brain imaging during delirium workup, cont to monitor

## 2013-07-14 NOTE — Assessment & Plan Note (Signed)
In the past with right mca infarct on MRI. Here for therapy.

## 2013-07-14 NOTE — Assessment & Plan Note (Signed)
Cont risperdal, ritalin, and prozac.  To be followed by [facility] here.

## 2013-07-14 NOTE — Assessment & Plan Note (Signed)
To work with [place] while here.

## 2013-07-14 NOTE — Assessment & Plan Note (Signed)
F/u bmp next draw.

## 2013-07-14 NOTE — Assessment & Plan Note (Signed)
Cont risperdal right now.  Maybe able to taper off gradually.

## 2013-07-14 NOTE — Assessment & Plan Note (Signed)
Not currently on any meds for her spasticity.  To work with PT, OT.  Will monitor for needs.

## 2013-07-14 NOTE — Assessment & Plan Note (Signed)
No evidence of growth/recurrence on imaging.  Mentation quite poor.  Cont to monitor

## 2013-07-14 NOTE — Assessment & Plan Note (Addendum)
Seems this has not resolved, but I have not been the physician following her prior to this stay.  She remains perseverative and not at all appropriate with her responses.  Difficult to tell what her mental baseline is after several neurologic insults.  Seems her psychosis has resolved though with the use of risperdal.  I would question encephalitis in this lady if I knew for sure she was not back to her baseline.  ? If LP should be done if this persists.  Unfortunately, she has the tumor history and SAH history so this may be at least relatively contraindicated.  Is completing ceftin therapy.

## 2013-07-14 NOTE — Assessment & Plan Note (Signed)
F/u cbc next draw.

## 2013-07-17 LAB — CULTURE, BLOOD (ROUTINE X 2)
CULTURE: NO GROWTH
CULTURE: NO GROWTH

## 2013-08-10 ENCOUNTER — Non-Acute Institutional Stay (SKILLED_NURSING_FACILITY): Payer: PRIVATE HEALTH INSURANCE | Admitting: Internal Medicine

## 2013-08-10 ENCOUNTER — Encounter: Payer: Self-pay | Admitting: Internal Medicine

## 2013-08-10 DIAGNOSIS — G9340 Encephalopathy, unspecified: Secondary | ICD-10-CM

## 2013-08-10 DIAGNOSIS — I609 Nontraumatic subarachnoid hemorrhage, unspecified: Secondary | ICD-10-CM

## 2013-08-10 DIAGNOSIS — I671 Cerebral aneurysm, nonruptured: Secondary | ICD-10-CM

## 2013-08-10 DIAGNOSIS — H6120 Impacted cerumen, unspecified ear: Secondary | ICD-10-CM

## 2013-08-10 DIAGNOSIS — M7989 Other specified soft tissue disorders: Secondary | ICD-10-CM
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
## 2013-08-19 ENCOUNTER — Encounter: Payer: PRIVATE HEALTH INSURANCE | Admitting: Physical Medicine & Rehabilitation

## 2013-08-24 ENCOUNTER — Non-Acute Institutional Stay (SKILLED_NURSING_FACILITY): Payer: PRIVATE HEALTH INSURANCE | Admitting: Internal Medicine

## 2013-08-24 DIAGNOSIS — R569 Unspecified convulsions: Secondary | ICD-10-CM | POA: Diagnosis not present

## 2013-08-24 DIAGNOSIS — F05 Delirium due to known physiological condition: Principal | ICD-10-CM

## 2013-08-24 DIAGNOSIS — C719 Malignant neoplasm of brain, unspecified: Secondary | ICD-10-CM | POA: Diagnosis not present

## 2013-08-24 DIAGNOSIS — I671 Cerebral aneurysm, nonruptured: Secondary | ICD-10-CM

## 2013-08-24 DIAGNOSIS — R410 Disorientation, unspecified: Secondary | ICD-10-CM | POA: Diagnosis not present

## 2013-08-24 DIAGNOSIS — I609 Nontraumatic subarachnoid hemorrhage, unspecified: Secondary | ICD-10-CM | POA: Diagnosis not present
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
## 2013-08-25 ENCOUNTER — Other Ambulatory Visit: Payer: Self-pay | Admitting: Internal Medicine

## 2013-08-25 DIAGNOSIS — I609 Nontraumatic subarachnoid hemorrhage, unspecified: Secondary | ICD-10-CM

## 2013-08-25 DIAGNOSIS — I729 Aneurysm of unspecified site: Secondary | ICD-10-CM

## 2013-08-25 DIAGNOSIS — C719 Malignant neoplasm of brain, unspecified: Secondary | ICD-10-CM

## 2013-08-25 LAB — LIPID PANEL
CHOLESTEROL: 179 mg/dL (ref 0–200)
HDL: 48 mg/dL (ref 35–70)
LDL Cholesterol: 118 mg/dL
Triglycerides: 66 mg/dL (ref 40–160)

## 2013-08-30 ENCOUNTER — Encounter: Payer: Self-pay | Admitting: Internal Medicine

## 2013-08-31 ENCOUNTER — Ambulatory Visit
Admission: RE | Admit: 2013-08-31 | Discharge: 2013-08-31 | Disposition: A | Discharge status: Home / Self Care | Payer: No Typology Code available for payment source | Source: Ambulatory Visit | Attending: Internal Medicine | Admitting: Internal Medicine

## 2013-08-31 DIAGNOSIS — C719 Malignant neoplasm of brain, unspecified: Secondary | ICD-10-CM

## 2013-08-31 DIAGNOSIS — I729 Aneurysm of unspecified site: Secondary | ICD-10-CM

## 2013-08-31 DIAGNOSIS — I609 Nontraumatic subarachnoid hemorrhage, unspecified: Secondary | ICD-10-CM

## 2013-09-05 ENCOUNTER — Other Ambulatory Visit: Payer: Self-pay | Admitting: Radiation Therapy

## 2013-09-05 DIAGNOSIS — C719 Malignant neoplasm of brain, unspecified: Secondary | ICD-10-CM

## 2013-09-27 ENCOUNTER — Non-Acute Institutional Stay (SKILLED_NURSING_FACILITY): Payer: PRIVATE HEALTH INSURANCE | Admitting: Internal Medicine

## 2013-09-27 ENCOUNTER — Encounter: Payer: Self-pay | Admitting: Internal Medicine

## 2013-09-27 DIAGNOSIS — G8110 Spastic hemiplegia affecting unspecified side: Secondary | ICD-10-CM

## 2013-09-27 DIAGNOSIS — I609 Nontraumatic subarachnoid hemorrhage, unspecified: Secondary | ICD-10-CM

## 2013-09-27 DIAGNOSIS — Z86718 Personal history of other venous thrombosis and embolism: Secondary | ICD-10-CM

## 2013-09-27 DIAGNOSIS — I671 Cerebral aneurysm, nonruptured: Secondary | ICD-10-CM

## 2013-09-27 DIAGNOSIS — G8114 Spastic hemiplegia affecting left nondominant side: Secondary | ICD-10-CM

## 2013-09-27 NOTE — Assessment & Plan Note (Signed)
Pt's pain is interfering with rehab and definitely sounds neurologic. Neurontin is the drug of choice for sure but will need to be careful with the risperdal and sleepiness. Will start low with 100 mg q HS and if tolerating in 3 days to BID and monitor from there.

## 2013-09-27 NOTE — Assessment & Plan Note (Signed)
See above

## 2013-09-27 NOTE — Progress Notes (Signed)
MRN: [id number] Name: [name]  Sex: female Age: 53 y.o. DOB: [birth year]  [place] #: [facility] Facility/Room: 740C Level Of Care: SNF Provider: Hennie Duos Emergency Contacts: Extended Emergency Contact Information Primary Emergency Contact: Yassin,Tim Address: [address]          [place], [place] [address] [place] Phone: [phone] Mobile Phone: [phone] Relation: Spouse Secondary Emergency Contact: Kimberlee Nearing [place] Mobile Phone: [phone] Relation: Father  Code Status: FULL  Allergies: Vasotec  Chief Complaint  Patient presents with  . Medical Management of Chronic Issues    HPI: Patient is 53 y.o. female who is being seen for a routine visit.  Past Medical History  Diagnosis Date  . 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 Fracture, ankle 2012    right  . History of radiation therapy 09/23/10- 11/07/10    right parietal lobe  . Chronic headaches     uses oxycodone appropriately  . Anxiety   . Muscle spasticity   . Seizures   . Dysphagia     Past Surgical History  Procedure Laterality Date  . Orif ankle fracture  08/22/2010    right  . Radiology with anesthesia N/A 05/02/2013    Procedure: RADIOLOGY WITH ANESTHESIA;  Surgeon: Consuella Lose, MD;  Location: [facility];  Service: Radiology;  Laterality: N/A;  . Brain surgery Right 2012    astrocytoma R parietal lobe      Medication List       This list is accurate as of: 09/27/13 10:07 PM.  Always use your most recent med list.               aspirin 325 MG EC tablet  Take 325 mg by mouth daily.     clopidogrel 75 MG tablet  Commonly known as:  PLAVIX  Take 75 mg by mouth daily with breakfast.     FLUoxetine 20 MG capsule  Commonly known as:  PROZAC  Take 1 capsule (20 mg total) by mouth daily.     food thickener Powd  Commonly known as:  THICK IT  Take 1 g by mouth as needed (dietary use).     lacosamide 50 MG Tabs  tablet  Commonly known as:  VIMPAT  Take 1 tablet (50 mg total) by mouth 2 (two) times daily.     levETIRAcetam 500 MG tablet  Commonly known as:  KEPPRA  Take 1 tablet (500 mg total) by mouth every 12 (twelve) hours.     methylphenidate 5 MG tablet  Commonly known as:  RITALIN  Take one tablet by mouth twice daily     multivitamin tablet  Take 1 tablet by mouth daily.     pantoprazole 40 MG tablet  Commonly known as:  PROTONIX  Take 40 mg by mouth daily.     risperiDONE 1 MG tablet  Commonly known as:  RISPERDAL  Take 1 tablet (1 mg total) by mouth at bedtime.        No orders of the defined types were placed in this encounter.    Immunization History  Administered Date(s) Administered  . Influenza,inj,Quad PF,36+ Mos 05/26/2013  . Pneumococcal Polysaccharide-23 05/26/2013  . Tdap 08/30/2011    History  Substance Use Topics  . Smoking status: Never Smoker   . Smokeless tobacco: Never Used  . Alcohol Use: 0.0 oz/week    3-4 Cans of beer per week    Review of Systems  DATA OBTAINED: from patient, PT GENERAL: Feels well no fevers, fatigue, appetite changes SKIN: No itching, rash HEENT: No complaint RESPIRATORY: No cough, wheezing, SOB CARDIAC: No chest pain, palpitations, lower extremity edema  GI: No abdominal pain, No N/V/D or constipation, No heartburn or reflux  GU: No dysuria, frequency or urgency, or incontinence  MUSCULOSKELETAL:c/o shooting pain L arm and leg; per PT even a light touch will elicit pain NEUROLOGIC: No headache, dizziness PSYCHIATRIC: No overt anxiety or sadness. Has had some issues with being too sleepy in the past, presumably from meds   Filed Vitals:   09/27/13 2119  BP: 117/74  Pulse: 80  Temp: 97.7 F (36.5 C)  Resp: 20    Physical Exam  GENERAL APPEARANCE: Alert, conversant. Appropriately groomed. No acute distress  SKIN: No diaphoresis rash, or wounds HEENT: Unremarkable RESPIRATORY: Breathing is even, unlabored. Lung  sounds are clear   CARDIOVASCULAR: Heart RRR no murmurs, rubs or gallops. No peripheral edema  GASTROINTESTINAL: Abdomen is soft, non-tender, not distended w/ normal bowel sounds.  GENITOURINARY: Bladder non tender, not distended  MUSCULOSKELETAL: L hemiparesis and contractures NEUROLOGIC: Cranial nerves 2-12 grossly intact;L hemiparesis PSYCHIATRIC: Mood and affect appropriate to situation, no behavioral issues  Patient Active Problem List   Diagnosis Date Noted  . History of DVT (deep vein thrombosis) 09/27/2013  . Left spastic hemiparesis 07/14/2013  . Acute encephalopathy 07/05/2013  . Pancytopenia 07/05/2013  . CKD (chronic kidney disease) stage 3, GFR 30-59 ml/min 07/05/2013  . Psychosis 07/05/2013  . Involuntary movements 06/21/2013  . Dysphagia   . Depression with anxiety 05/31/2013  . Elevated alkaline phosphatase level 05/25/2013  . Prerenal azotemia 05/25/2013  . Mental status change 05/25/2013  . Anxiety   . Seizures   . SAH (subarachnoid hemorrhage) 04/21/2013  . Cerebral aneurysm, nonruptured 04/21/2013  . Protein-calorie malnutrition, severe 04/19/2013  . History of radiation therapy   . 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    CBC    Component Value Date/Time   WBC 3.3* 07/11/2013 0420   WBC 3.3* 12/07/2012 1552   RBC 2.71* 07/11/2013 0420   RBC 3.72 12/07/2012 1552   HGB 8.3* 07/11/2013 0420   HGB 11.9 12/07/2012 1552   HCT 24.5* 07/11/2013 0420   HCT 34.6* 12/07/2012 1552   PLT 138* 07/11/2013 0420   PLT 97* 12/07/2012 1552   MCV 90.4 07/11/2013 0420   MCV 92.9 12/07/2012 1552   LYMPHSABS 0.9 07/05/2013 1310   LYMPHSABS 0.7* 12/07/2012 1552   MONOABS 0.3 07/05/2013 1310   MONOABS 0.3 12/07/2012 1552   EOSABS 0.1 07/05/2013 1310   EOSABS 0.1 12/07/2012 1552   BASOSABS 0.0 07/05/2013 1310   BASOSABS 0.0 12/07/2012 1552    CMP     Component Value Date/Time   NA 141 07/11/2013 0420   NA 144 12/07/2012 1552   K 4.1 07/11/2013 0420   K 4.0 12/07/2012 1552   CL 108 07/11/2013 0420   CL 105  08/31/2012 1523   CO2 22 07/11/2013 0420   CO2 31* 12/07/2012 1552   GLUCOSE 90 07/11/2013 0420   GLUCOSE 112 12/07/2012 1552   GLUCOSE 97 08/31/2012 1523   BUN 13 07/11/2013 0420   BUN 12.2 12/07/2012 1552   CREATININE 1.03 07/11/2013 0420   CREATININE 1.0 12/07/2012 1552   CALCIUM 9.3 07/11/2013 0420   CALCIUM 10.4 12/07/2012 1552   PROT 5.5* 05/29/2013 0728   PROT 6.5 12/07/2012 1552   ALBUMIN 2.7* 05/29/2013 0728   ALBUMIN 3.2* 12/07/2012 1552  AST 30 05/29/2013 0728   AST 56* 12/07/2012 1552   ALT 34 05/29/2013 0728   ALT 27 12/07/2012 1552   ALKPHOS 119* 05/29/2013 0728   ALKPHOS 127 12/07/2012 1552   BILITOT 0.5 05/29/2013 0728   BILITOT 1.08 12/07/2012 1552   GFRNONAA 60* 07/11/2013 0420   GFRAA 70* 07/11/2013 0420    Assessment and Plan  Left spastic hemiparesis Pt's pain is interfering with rehab and definitely sounds neurologic. Neurontin is the drug of choice for sure but will need to be careful with the risperdal and sleepiness. Will start low with 100 mg q HS and if tolerating in 3 days to BID and monitor from there.  History of DVT (deep vein thrombosis) Review of pt's chart reveals she is on ASA and Plavix. I have reviewed back to 2013 to see why/when pt placed on ASA and Plavix. She was placed on it in 05/2013 but no good documentation why, the meds came over from [facility]. Was DVT part of malignancy syndrome? We definitely know she had a small brain infarct and SAH with her astrocytoma and still has astrocytoma. She could bleed, she could clot. Dr Mariea Clonts and I after much discussion have decided to stop plavix and keep only ASA as prophylaxis for DVT. There are pros and cons with either drug, either choice.  SAH (subarachnoid hemorrhage) See above  Cerebral aneurysm, nonruptured See above    Hennie Duos, MD

## 2013-09-27 NOTE — Assessment & Plan Note (Signed)
Review of pt's chart reveals she is on ASA and Plavix. I have reviewed back to 2013 to see why/when pt placed on ASA and Plavix. She was placed on it in 05/2013 but no good documentation why, the meds came over from [facility]. Was DVT part of malignancy syndrome? We definitely know she had a small brain infarct and SAH with her astrocytoma and still has astrocytoma. She could bleed, she could clot. Dr Mariea Clonts and I after much discussion have decided to stop plavix and keep only ASA as prophylaxis for DVT. There are pros and cons with either drug, either choice.

## 2013-10-07 ENCOUNTER — Encounter: Payer: Medicare Other | Admitting: Physical Medicine & Rehabilitation

## 2013-11-16 ENCOUNTER — Encounter: Payer: Self-pay | Admitting: Internal Medicine

## 2013-11-16 ENCOUNTER — Non-Acute Institutional Stay (SKILLED_NURSING_FACILITY): Payer: PRIVATE HEALTH INSURANCE | Admitting: Internal Medicine

## 2013-11-16 DIAGNOSIS — F3289 Other specified depressive episodes: Secondary | ICD-10-CM | POA: Diagnosis not present

## 2013-11-16 DIAGNOSIS — N183 Chronic kidney disease, stage 3 unspecified: Secondary | ICD-10-CM

## 2013-11-16 DIAGNOSIS — C719 Malignant neoplasm of brain, unspecified: Secondary | ICD-10-CM | POA: Diagnosis not present

## 2013-11-16 DIAGNOSIS — I609 Nontraumatic subarachnoid hemorrhage, unspecified: Secondary | ICD-10-CM

## 2013-11-16 DIAGNOSIS — F32A Depression, unspecified: Secondary | ICD-10-CM

## 2013-11-16 DIAGNOSIS — I671 Cerebral aneurysm, nonruptured: Secondary | ICD-10-CM

## 2013-11-16 DIAGNOSIS — R569 Unspecified convulsions: Secondary | ICD-10-CM | POA: Diagnosis not present

## 2013-11-16 DIAGNOSIS — Z86718 Personal history of other venous thrombosis and embolism: Secondary | ICD-10-CM

## 2013-11-16 DIAGNOSIS — F329 Major depressive disorder, single episode, unspecified: Secondary | ICD-10-CM

## 2013-11-16 DIAGNOSIS — G8114 Spastic hemiplegia affecting left nondominant side: Secondary | ICD-10-CM

## 2013-11-16 DIAGNOSIS — G8110 Spastic hemiplegia affecting unspecified side: Secondary | ICD-10-CM

## 2013-11-16 NOTE — Progress Notes (Signed)
Patient ID: [name], female   DOB: [birth year], 53 y.o.   MRN: [id number]  Location:  [facility] Provider:  Rexene Edison. Mariea Clonts, D.O., C.M.D.  Code Status:  Full code  Chief Complaint  Patient presents with  . Medical Management of Chronic Issues    HPI:  53 yo white female here for long term care seen for routine visit.  No new concerns  Review of Systems:  Review of Systems  Constitutional: Negative for fever and malaise/fatigue.  HENT: Negative for congestion and hearing loss.   Eyes: Negative for blurred vision.  Respiratory: Negative for shortness of breath.   Cardiovascular: Negative for chest pain.  Gastrointestinal: Negative for constipation, blood in stool and melena.  Genitourinary: Negative for dysuria.  Musculoskeletal: Negative for joint pain and falls.  Skin: Negative for rash.  Neurological: Negative for dizziness, weakness and headaches.  Endo/Heme/Allergies: Bruises/bleeds easily.  Psychiatric/Behavioral: Positive for depression and memory loss.    Medications: Patient's Medications  New Prescriptions   No medications on file  Previous Medications   ACETAMINOPHEN (TYLENOL) 325 MG TABLET    Take 650 mg by mouth every 4 (four) hours as needed.   ASPIRIN 325 MG EC TABLET    Take 325 mg by mouth daily.   FLUOXETINE (PROZAC) 20 MG CAPSULE    Take 1 capsule (20 mg total) by mouth daily.   GABAPENTIN (NEURONTIN) 100 MG CAPSULE    Take 100 mg by mouth 2 (two) times daily. Every morning and at bedtime for left side pain   HYDROCORTISONE (ANUSOL-HC) 2.5 % RECTAL CREAM    Place 1 application rectally 2 (two) times daily.   LACOSAMIDE (VIMPAT) 50 MG TABS TABLET    Take 1 tablet (50 mg total) by mouth 2 (two) times daily.   LAMOTRIGINE (LAMICTAL) 25 MG TABLET    Take 25 mg by mouth daily.   LEVETIRACETAM (KEPPRA) 500 MG TABLET    Take 1 tablet (500 mg total) by mouth every 12 (twelve) hours.   LORAZEPAM (ATIVAN) 0.5 MG TABLET    Take 0.5 mg by mouth  every 6 (six) hours as needed for anxiety (for anxiety and agitation).   MAGNESIUM HYDROXIDE (MILK OF MAGNESIA) 400 MG/5ML SUSPENSION    Take 30 mLs by mouth daily as needed for mild constipation.   MULTIPLE VITAMIN (MULTIVITAMIN) TABLET    Take 1 tablet by mouth daily.     PANTOPRAZOLE (PROTONIX) 40 MG TABLET    Take 40 mg by mouth daily.   RISPERIDONE (RISPERDAL) 1 MG TABLET    Take 1 tablet (1 mg total) by mouth at bedtime.  Modified Medications   No medications on file  Discontinued Medications   No medications on file    Physical Exam: Filed Vitals:   11/16/13 1252  BP: 118/84  Pulse: 78  Temp: 98.2 F (36.8 C)  Resp: 18  Height: 5\' 4"  (1.626 m)  Weight: 149 lb (67.586 kg)  SpO2: 98%  Physical Exam  Constitutional: No distress.  Pleasant white female seated in wheelchair  Cardiovascular: Normal rate, regular rhythm, normal heart sounds and intact distal pulses.   Pulmonary/Chest: Effort normal and breath sounds normal.  Abdominal: Soft. Bowel sounds are normal. She exhibits no distension and no mass. There is no tenderness.  Musculoskeletal: She exhibits no tenderness.  Neurological: She is alert.  Hemiparesis; facial droop  Skin: Skin is warm and dry. There is pallor.     Labs reviewed: Basic Metabolic Panel:  Recent  Labs  05/27/13 0505 05/28/13 0603 05/29/13 0728  07/05/13 1310 07/08/13 0453 07/11/13 0420  NA 144 142 139  < > 142 140 141  K 3.8 4.1 4.9  < > 3.9 3.7 4.1  CL 113* 113* 111  < > 107 110 108  CO2 23 22 18*  < > 24 19 22   GLUCOSE 137* 130* 134*  < > 120* 105* 90  BUN 21 19 18   < > 19 19 13   CREATININE 1.18* 1.17* 1.22*  < > 1.18* 1.17* 1.03  CALCIUM 10.2 10.2 9.7  < > 9.8 8.9 9.3  MG 1.7 1.7 1.8  --   --   --   --   < > = values in this interval not displayed.  Liver Function Tests:  Recent Labs  05/27/13 0505 05/28/13 0603 05/29/13 0728  AST 28 32 30  ALT 34 38* 34  ALKPHOS 100 104 119*  BILITOT 0.7 0.6 0.5  PROT 5.7* 5.6* 5.5*    ALBUMIN 3.0* 2.9* 2.7*    CBC:  Recent Labs  05/28/13 0603 05/29/13 0810 07/05/13 1310 07/08/13 0453 07/09/13 0524 07/11/13 0420  WBC 4.4 5.5 2.8* 3.0* 2.5* 3.3*  NEUTROABS 3.0 3.3 1.6*  --   --   --   HGB 10.0* 10.5* 9.7* 8.0* 8.2* 8.3*  HCT 29.5* 30.3* 27.1* 23.4* 23.7* 24.5*  MCV 92.2 90.7 89.1 90.0 90.5 90.4  PLT 182 178 120* 114* 108* 138*    Assessment/Plan 1. Left spastic hemiparesis -cont stroke preventive strategies (was hemorrhagic and has brain tumor also) -cont asa 325mg  at this stage only -cont gabapentin for neuropathic pain--pt noted benefit  2. SAH (subarachnoid hemorrhage) -cause of #1  3. Cerebral aneurysm, nonruptured -cont to monitor  4. Astrocytoma brain tumor -followed by rad onc -seen by palliative care at [facility]. Seizures -cont keppra, vimpat  6. CKD (chronic kidney disease) stage 3, GFR 30-59 ml/min -avoid nsaids, monitor renal function, maintain adequate hydration  7. History of DVT (deep vein thrombosis) -on asa only with h/o SAH causing stroke  8.  Depression:  Followed by [facility] -cont risperdal for psychosis related to this, prozac, lamictal for mood stabilization (?bipolar), ativan as needed for severe anxiety Family/ staff Communication: discussed with nurse and pt's father  Goals of care: remains full code  Labs/tests ordered:  Depakote, cbc, cmp

## 2013-11-23 ENCOUNTER — Encounter
Payer: PRIVATE HEALTH INSURANCE | Attending: Physical Medicine & Rehabilitation | Admitting: Physical Medicine & Rehabilitation

## 2013-12-26 ENCOUNTER — Ambulatory Visit
Admission: RE | Admit: 2013-12-26 | Discharge: 2013-12-26 | Disposition: A | Discharge status: Home / Self Care | Payer: PRIVATE HEALTH INSURANCE | Source: Ambulatory Visit | Attending: Radiation Oncology | Admitting: Radiation Oncology

## 2013-12-26 DIAGNOSIS — C719 Malignant neoplasm of brain, unspecified: Secondary | ICD-10-CM

## 2013-12-28 ENCOUNTER — Ambulatory Visit: Payer: Medicare Other | Admitting: Radiation Oncology

## 2014-01-05 ENCOUNTER — Ambulatory Visit (HOSPITAL_COMMUNITY): Admission: RE | Admit: 2014-01-05 | Payer: PRIVATE HEALTH INSURANCE | Source: Ambulatory Visit

## 2014-01-05 ENCOUNTER — Ambulatory Visit (HOSPITAL_COMMUNITY): Payer: No Typology Code available for payment source

## 2014-01-06 ENCOUNTER — Ambulatory Visit (HOSPITAL_COMMUNITY): Admission: RE | Admit: 2014-01-06 | Payer: PRIVATE HEALTH INSURANCE | Source: Ambulatory Visit

## 2014-01-06 ENCOUNTER — Ambulatory Visit (HOSPITAL_COMMUNITY)
Admission: RE | Admit: 2014-01-06 | Discharge: 2014-01-06 | Disposition: A | Discharge status: Home / Self Care | Payer: PRIVATE HEALTH INSURANCE | Source: Ambulatory Visit | Attending: Radiation Oncology | Admitting: Radiation Oncology

## 2014-01-06 ENCOUNTER — Other Ambulatory Visit (HOSPITAL_COMMUNITY): Payer: Self-pay | Admitting: Radiation Oncology

## 2014-01-06 DIAGNOSIS — G9389 Other specified disorders of brain: Secondary | ICD-10-CM | POA: Diagnosis not present

## 2014-01-06 DIAGNOSIS — C719 Malignant neoplasm of brain, unspecified: Secondary | ICD-10-CM

## 2014-01-06 DIAGNOSIS — Z87820 Personal history of traumatic brain injury: Secondary | ICD-10-CM | POA: Diagnosis not present

## 2014-01-06 DIAGNOSIS — Z8673 Personal history of transient ischemic attack (TIA), and cerebral infarction without residual deficits: Secondary | ICD-10-CM | POA: Diagnosis not present

## 2014-01-06 DIAGNOSIS — Z9889 Other specified postprocedural states: Secondary | ICD-10-CM | POA: Insufficient documentation

## 2014-01-06 LAB — POCT I-STAT CREATININE: CREATININE: 1.5 mg/dL — AB (ref 0.50–1.10)

## 2014-01-06 MED ORDER — GADOBENATE DIMEGLUMINE 529 MG/ML IV SOLN
7.0000 mL | Freq: Once | INTRAVENOUS | Status: AC | PRN
  Administered 2014-01-06: 7 mL via INTRAVENOUS

## 2014-01-16 ENCOUNTER — Encounter: Payer: Self-pay | Admitting: Radiation Oncology

## 2014-01-16 ENCOUNTER — Ambulatory Visit
Admission: RE | Admit: 2014-01-16 | Discharge: 2014-01-16 | Disposition: A | Discharge status: Home / Self Care | Payer: PRIVATE HEALTH INSURANCE | Source: Ambulatory Visit | Attending: Radiation Oncology | Admitting: Radiation Oncology

## 2014-01-16 VITALS — BP 100/67 | HR 74 | Temp 98.0°F | Resp 12

## 2014-01-16 DIAGNOSIS — Z515 Encounter for palliative care: Secondary | ICD-10-CM

## 2014-01-16 DIAGNOSIS — R5381 Other malaise: Secondary | ICD-10-CM

## 2014-01-16 DIAGNOSIS — F341 Dysthymic disorder: Secondary | ICD-10-CM

## 2014-01-16 DIAGNOSIS — R531 Weakness: Secondary | ICD-10-CM

## 2014-01-16 DIAGNOSIS — F418 Other specified anxiety disorders: Secondary | ICD-10-CM

## 2014-01-16 DIAGNOSIS — C719 Malignant neoplasm of brain, unspecified: Secondary | ICD-10-CM

## 2014-01-16 DIAGNOSIS — R5383 Other fatigue: Secondary | ICD-10-CM

## 2014-01-16 DIAGNOSIS — R1310 Dysphagia, unspecified: Secondary | ICD-10-CM

## 2014-01-16 NOTE — Progress Notes (Signed)
She rates her pain as a 5 on a scale of 0-10. Pt complains of, Fatigue, Generalized Weakness, Pain  Stabbing and Pain Occurs  Intermittently.  Pt is alert and oriented x2, (stated it was November) she has contractures over her left arm. She reports she had a stroke last September 2014. Pt reports positive for blurred, double vision, reports she can't see out of her left eye. PERRL, PERRLA, constant "echo" sound in ears. Pt presenting appropriate quality, quantity and organization of sentences. Pt reports she doesn't experience headaches. Reports she has a difficult time swallowing solids and liquids.  Oral exam reveals a moderate amount white exudate on her tongue.

## 2014-01-16 NOTE — Progress Notes (Signed)
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  The recent films were presented in our multidisciplinary conference with neuroradiology just prior to the clinic.  She has suffered a CVA and is now a resident at [facility] with limited capabilities.  Pt complains of, Fatigue, Generalized Weakness.  Pt reports positive for blurred, double vision, reports she can't see out of her left eye. PERRL, PERRLA, constant "echo" sound in ears. Pt presenting appropriate quality, quantity and organization of sentences. Pt reports she doesn't experience headaches.  Reports she has a difficult time swallowing solids and liquids.                              ALLERGIES:  is allergic to vasotec.  Meds: Current Outpatient Prescriptions  Medication Sig Dispense Refill  . acetaminophen (TYLENOL) 325 MG tablet Take 650 mg by mouth every 4 (four) hours as needed.      Marland Kitchen FLUoxetine (PROZAC) 20 MG capsule Take 60 mg by mouth daily.      Marland Kitchen gabapentin (NEURONTIN) 100 MG capsule Take 100 mg by mouth 2 (two) times daily. Every morning and at bedtime for left side pain      . lacosamide (VIMPAT) 50 MG TABS tablet Take 1 tablet (50 mg total) by mouth 2 (two) times daily.  60 tablet  5  . lamoTRIgine (LAMICTAL) 25 MG tablet Take 100 mg by mouth daily.       . magnesium hydroxide (MILK OF MAGNESIA) 400 MG/5ML suspension Take 30 mLs by mouth daily as needed for mild  constipation.      . Multiple Vitamin (MULTIVITAMIN) tablet Take 1 tablet by mouth daily.        . pantoprazole (PROTONIX) 40 MG tablet Take 40 mg by mouth daily.      . risperiDONE (RISPERDAL) 1 MG tablet Take 1 tablet (1 mg total) by mouth at bedtime.      Marland Kitchen aspirin 325 MG EC tablet Take 325 mg by mouth daily.      . hydrocortisone (ANUSOL-HC) 2.5 % rectal cream Place 1 application rectally 2 (two) times daily.      Marland Kitchen levETIRAcetam (KEPPRA) 500 MG tablet Take 1 tablet (500 mg total) by mouth every 12 (twelve) hours.  60 tablet  5  . LORazepam (ATIVAN) 0.5 MG tablet Take 0.5 mg by mouth every 6 (six) hours as needed for anxiety (for anxiety and agitation).       No current facility-administered medications for this encounter.    Physical Findings: The patient is in no acute distress. Patient is alert and oriented.  oral temperature is 98 F (36.7 C). Her blood pressure is 100/67 and her pulse is 74. Her respiration is 12 and oxygen saturation is 100%. .Pt is alert and oriented x2, (stated it was November) she has contractures over her left arm.  Oral exam reveals a moderate amount  white exudate on her tongue  No significant changes.  Lab Findings: Lab Results  Component Value Date   WBC 3.3* 07/11/2013   HGB 8.3* 07/11/2013   HCT 24.5* 07/11/2013   MCV 90.4 07/11/2013   PLT 138* 07/11/2013    @LASTCHEM @  Radiographic Findings: Mr [name] Contrast  01/06/2014   CLINICAL DATA:  53 year old female with remote history of astrocytoma resection and radiation therapy several years ago, subsequent intracranial hemorrhage, right MCA infarct, traumatic brain injury. 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 Restaging brain tumor. Subsequent encounter.  EXAM: MRI HEAD WITHOUT AND WITH CONTRAST  TECHNIQUE: Multiplanar, multiecho pulse sequences of the brain and surrounding structures were obtained without and with intravenous contrast.  CONTRAST:  72mL MULTIHANCE  GADOBENATE DIMEGLUMINE 529 MG/ML IV SOLN  COMPARISON:  Head CT 08/31/2013 and earlier. Brain MRI and MRA 07/05/2013 and earlier  FINDINGS: Major intracranial vascular flow voids are stable. Stable ventricle size and configuration. No midline shift. No new or increased intracranial mass effect.  Small persistent right side extra-axial collection or dural thickening (series 6, image 14). Chronic right parietal lobe tumor treatment cavity is stable in size and configuration, measuring about 4 cm diameter. Previously-seen hematocrit level, which occurred simultaneously with the prior intracranial hemorrhage, has resolved. Surrounding right hemisphere white matter T2 and FLAIR hyperintensity is stable. Superimposed posterior right MCA territory laminar necrosis and superficial siderosis is stable. No progression of siderosis.  Following contrast, minimal if any residual right parietal lobe hyper enhancement is noted (note intrinsic T1 hyperintensity on series 2). The nodular enhancement seen along the superior medial aspect of the resection cavity in December 2014 has largely resolved (series 12, image 39 on 05/25/2013 versus series 12, image 77 today).  I note that the right side extra-axial collection appears to homogeneously enhance, suggesting (at least a component of) dural thickening (series 12, image 67 versus series 7, image 60).  At the same time, the posterior left hemisphere white matter T2 and FLAIR hyperintensity which had increased at the time of the prior study now has decreased (series 6, image 16 today versus series 7, image 18 on 07/05/2013). Stable splenium of the corpus callosum. No abnormal enhancement in the left hemisphere.  Restricted diffusion in the high right parietal lobe superior to the treatment cavity is stable to regressed (series 100, images 23 and 24). No corresponding enhancement. No new diffusion abnormality elsewhere.  Stable and negative pituitary, basilar cisterns, cervicomedullary  junction, and visualized cervical spine. Stable bone marrow signal. Visualized scalp soft tissues are within normal limits. Visible internal auditory structures appear normal. Mastoids are clear. Negative orbits soft tissues. Maxillary and right sphenoid sinus fluid or mucosal thickening has mildly increased.  IMPRESSION: 1. Sequelae of right MCA infarct and right hemisphere hemorrhage, with stable superficial siderosis and laminar necrosis. Residual right subdural signal more resembles chronic dural thickening/granulation than residual subdural blood. 2. Stable right parietal lobe resection/treatment cavity, with stable to regressed associated diffusion signal along its superior margin, and resolved nodular enhancement along its superior medial margin since December 2014. Stable surrounding T2 and FLAIR signal abnormality. 3. Regression of the posterior left hemisphere white matter T2 and FLAIR hyperintensity noted on 07/05/2013. 4. No new intracranial abnormality or progression of diseased suspected.   Electronically Signed   By: Lars Pinks M.D.   On: 01/06/2014 13:25   Impression:  The patient is recovering from CVA with profound deficits, but, no evidence of recurrence on MRI.  Plan:  MRI in 6 months then follow-up.  _____________________________________  Sheral Apley. [name], M.D.
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
## 2014-01-17 ENCOUNTER — Telehealth: Payer: Self-pay | Admitting: Radiation Oncology

## 2014-01-17 NOTE — Telephone Encounter (Signed)
Patient seen yesterday in the clinic for a follow up with Dr. [name] and introduction to Wadie Lessen. Several needs identified. Stanton Kidney verbalized her intention to reach out to the [employer] [facility]. Determined a follow up appointment was needed with Stanton Kidney to address DNR, MOST form and HPOA. Appointment scheduled for Sept. 21 at 1000. Phoned patient's father/primary support system and Thayer Headings at [facility] making them aware of this appointment.

## 2014-01-18 ENCOUNTER — Encounter: Payer: Self-pay | Admitting: Internal Medicine

## 2014-01-18 ENCOUNTER — Non-Acute Institutional Stay (SKILLED_NURSING_FACILITY): Payer: PRIVATE HEALTH INSURANCE | Admitting: Internal Medicine

## 2014-01-18 DIAGNOSIS — N183 Chronic kidney disease, stage 3 unspecified: Secondary | ICD-10-CM

## 2014-01-18 DIAGNOSIS — G8110 Spastic hemiplegia affecting unspecified side: Secondary | ICD-10-CM

## 2014-01-18 DIAGNOSIS — R1310 Dysphagia, unspecified: Secondary | ICD-10-CM

## 2014-01-18 DIAGNOSIS — I609 Nontraumatic subarachnoid hemorrhage, unspecified: Secondary | ICD-10-CM

## 2014-01-18 DIAGNOSIS — C719 Malignant neoplasm of brain, unspecified: Secondary | ICD-10-CM

## 2014-01-18 DIAGNOSIS — R569 Unspecified convulsions: Secondary | ICD-10-CM

## 2014-01-18 DIAGNOSIS — G8114 Spastic hemiplegia affecting left nondominant side: Secondary | ICD-10-CM

## 2014-01-18 DIAGNOSIS — F29 Unspecified psychosis not due to a substance or known physiological condition: Secondary | ICD-10-CM

## 2014-01-21 ENCOUNTER — Encounter: Payer: Self-pay | Admitting: Internal Medicine

## 2014-01-21 NOTE — Progress Notes (Signed)
Patient ID: [name], female   DOB: 1960/10/18, 53 y.o.   MRN: [id number]   Location: [facility] Provider:  Rexene Edison. Naquan Garman, D.O.  Code Status: full code  Allergies  Allergen Reactions  . Vasotec Other (See Comments)    Causes angioedema    Chief Complaint  Patient presents with  . Medical Management of Chronic Issues  . Acute Visit    left arm and leg with stabbing and burning pains    HPI: Patient is a 53 y.o. white female long term care resident seen today for med management of chronic diseases and she c/o left arm stabbing and left leg burning pains that come and go.  She is working with occupational therapy on this some.    Review of Systems:  Review of Systems  Constitutional: Negative for fever and malaise/fatigue.  HENT: Negative for congestion.   Eyes: Negative for blurred vision.  Respiratory: Negative for shortness of breath.   Cardiovascular: Negative for chest pain and leg swelling.  Gastrointestinal: Negative for abdominal pain, constipation, blood in stool and melena.  Genitourinary: Negative for dysuria.  Musculoskeletal: Negative for falls.       Left arm and leg pain  Skin: Negative for rash.  Neurological: Negative for dizziness, seizures, loss of consciousness and headaches.  Endo/Heme/Allergies: Bruises/bleeds easily.  Psychiatric/Behavioral: Positive for depression and memory loss. The patient is nervous/anxious and has insomnia.        Does sometimes sleep well now     Past Medical History  Diagnosis Date  . 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 Fracture, ankle 2012    right  . History of radiation therapy 09/23/10- 11/07/10    right parietal lobe  . Chronic headaches     uses oxycodone appropriately  . Anxiety   . Muscle spasticity   . Seizures   . Dysphagia     Past Surgical History  Procedure Laterality Date  . Orif ankle fracture  08/22/2010    right  . Radiology with anesthesia N/A  05/02/2013    Procedure: RADIOLOGY WITH ANESTHESIA;  Surgeon: Consuella Lose, MD;  Location: [facility];  Service: Radiology;  Laterality: N/A;  . Brain surgery Right 2012    astrocytoma R parietal lobe    Social History:   reports that she has never smoked. She has never used smokeless tobacco. She reports that she drinks alcohol. She reports that she does not use illicit drugs.  Family History  Problem Relation Age of Onset  . Breast cancer Mother     Medications: Patient's Medications  New Prescriptions   No medications on file  Previous Medications   ACETAMINOPHEN (TYLENOL) 325 MG TABLET    Take 650 mg by mouth every 4 (four) hours as needed.   ASPIRIN 325 MG EC TABLET    Take 325 mg by mouth daily.   FLUOXETINE (PROZAC) 20 MG CAPSULE    Take 60 mg by mouth daily.   GABAPENTIN (NEURONTIN) 100 MG CAPSULE    Take 100 mg by mouth 2 (two) times daily. Every morning and at bedtime for left side pain   HYDROCORTISONE (ANUSOL-HC) 2.5 % RECTAL CREAM    Place 1 application rectally 2 (two) times daily.   LACOSAMIDE (VIMPAT) 50 MG TABS TABLET    Take 1 tablet (50 mg total) by mouth 2 (two) times daily.   LAMOTRIGINE (LAMICTAL) 25 MG TABLET    Take 100 mg by mouth daily.  LEVETIRACETAM (KEPPRA) 500 MG TABLET    Take 1 tablet (500 mg total) by mouth every 12 (twelve) hours.   LORAZEPAM (ATIVAN) 0.5 MG TABLET    Take 0.5 mg by mouth every 6 (six) hours as needed for anxiety (for anxiety and agitation).   MAGNESIUM HYDROXIDE (MILK OF MAGNESIA) 400 MG/5ML SUSPENSION    Take 30 mLs by mouth daily as needed for mild constipation.   MULTIPLE VITAMIN (MULTIVITAMIN) TABLET    Take 1 tablet by mouth daily.     PANTOPRAZOLE (PROTONIX) 40 MG TABLET    Take 40 mg by mouth daily.   RISPERIDONE (RISPERDAL) 1 MG TABLET    Take 1 tablet (1 mg total) by mouth at bedtime.  Modified Medications   No medications on file  Discontinued Medications   No medications on file     Physical Exam: Filed Vitals:    01/18/14 1344  BP: 118/84  Pulse: 78  Temp: 98.2 F (36.8 C)  Resp: 18  Height: 5\' 4"  (1.626 m)  Weight: 152 lb (68.947 kg)  SpO2: 98%  Physical Exam  Constitutional: She appears well-developed and well-nourished. No distress.  Cardiovascular: Normal rate, regular rhythm, normal heart sounds and intact distal pulses.   Pulmonary/Chest: Effort normal and breath sounds normal. No respiratory distress.  Abdominal: Soft. Bowel sounds are normal. She exhibits no distension and no mass. There is no tenderness.  Neurological: She is alert. She exhibits abnormal muscle tone.  Oriented to person and place, much more alert than she was a couple of months ago, answering appropriately;  Left hemiparesis, has braces in place to prevent contractures  Skin: Skin is warm.  Psychiatric: She has a normal mood and affect.  Pleasant, not tearful and affect no longer so flat    Labs reviewed: Basic Metabolic Panel:  Recent Labs  05/25/13 0320  05/27/13 0505 05/28/13 0603 05/29/13 0728  07/05/13 1310 07/08/13 0453 07/11/13 0420 01/06/14 0840  NA  --   < > 144 142 139  < > 142 140 141  --   K  --   < > 3.8 4.1 4.9  < > 3.9 3.7 4.1  --   CL  --   < > 113* 113* 111  < > 107 110 108  --   CO2  --   < > 23 22 18*  < > 24 19 22   --   GLUCOSE  --   < > 137* 130* 134*  < > 120* 105* 90  --   BUN  --   < > 21 19 18   < > 19 19 13   --   CREATININE  --   < > 1.18* 1.17* 1.22*  < > 1.18* 1.17* 1.03 1.50*  CALCIUM  --   < > 10.2 10.2 9.7  < > 9.8 8.9 9.3  --   MG  --   < > 1.7 1.7 1.8  --   --   --   --   --   TSH 0.862  --   --   --   --   --   --   --   --   --   < > = values in this interval not displayed. Liver Function Tests:  Recent Labs  05/27/13 0505 05/28/13 0603 05/29/13 0728  AST 28 32 30  ALT 34 38* 34  ALKPHOS 100 104 119*  BILITOT 0.7 0.6 0.5  PROT 5.7* 5.6* 5.5*  ALBUMIN 3.0* 2.9* 2.7*  Recent Labs  05/24/13 2010 07/05/13 1519  AMMONIA 63* 52   CBC:  Recent Labs   05/28/13 0603 05/29/13 0810 07/05/13 1310 07/08/13 0453 07/09/13 0524 07/11/13 0420  WBC 4.4 5.5 2.8* 3.0* 2.5* 3.3*  NEUTROABS 3.0 3.3 1.6*  --   --   --   HGB 10.0* 10.5* 9.7* 8.0* 8.2* 8.3*  HCT 29.5* 30.3* 27.1* 23.4* 23.7* 24.5*  MCV 92.2 90.7 89.1 90.0 90.5 90.4  PLT 182 178 120* 114* 108* 138*   Lab Results  Component Value Date   HGBA1C 4.8 05/25/2013   Assessment/Plan 1. Left spastic hemiparesis -with chronic pain related to this since stroke -will increase her gabapentin to 300mg  po bid and monitor for sedation  2. Astrocytoma brain tumor -fortunately, latest imaging 8/17 with Dr. [name] showed no recurrence on MRI, has palliative care appt with Wadie Lessen at the cancer center 9/21 at noon  3. SAH (subarachnoid hemorrhage) -with stroke and left hemiparesis -here getting therapy  4. Dysphagia -cont on mechanical soft regular diet with aspiration precautions  5. CKD (chronic kidney disease) stage 3, GFR 30-59 ml/min -f/u bmp, avoid nsaids  6. Seizures -stable on keppra and vimpat now  7. Psychosis, unspecified psychosis type -remains on risperdal, is much improved lately--seems this is associated with depression for her, but her baseline psychiatric diagnosis is not easily found in epic  Discussed with nursing staff, spoke with her father last week when she was in [place]

## 2014-01-23 ENCOUNTER — Other Ambulatory Visit: Payer: Self-pay | Admitting: *Deleted

## 2014-01-23 DIAGNOSIS — Z515 Encounter for palliative care: Secondary | ICD-10-CM | POA: Insufficient documentation

## 2014-01-23 DIAGNOSIS — R531 Weakness: Secondary | ICD-10-CM | POA: Insufficient documentation

## 2014-01-23 MED ORDER — LACOSAMIDE 50 MG PO TABS: ORAL_TABLET | ORAL | Status: DC

## 2014-01-23 MED ORDER — LORAZEPAM 0.5 MG PO TABS: 0.5000 mg | ORAL_TABLET | Freq: Four times a day (QID) | ORAL | Status: DC | PRN

## 2014-01-23 NOTE — Telephone Encounter (Signed)
[facility] 

## 2014-02-20 ENCOUNTER — Ambulatory Visit
Admission: RE | Admit: 2014-02-20 | Discharge: 2014-02-20 | Disposition: A | Discharge status: Home / Self Care | Payer: PRIVATE HEALTH INSURANCE | Source: Ambulatory Visit | Attending: Radiation Oncology | Admitting: Radiation Oncology

## 2014-03-13 ENCOUNTER — Non-Acute Institutional Stay (SKILLED_NURSING_FACILITY): Payer: Medicare Other | Admitting: Nurse Practitioner

## 2014-03-13 DIAGNOSIS — F418 Other specified anxiety disorders: Secondary | ICD-10-CM | POA: Diagnosis not present

## 2014-03-13 DIAGNOSIS — R569 Unspecified convulsions: Secondary | ICD-10-CM | POA: Diagnosis not present

## 2014-03-13 DIAGNOSIS — G8110 Spastic hemiplegia affecting unspecified side: Secondary | ICD-10-CM | POA: Diagnosis not present

## 2014-03-13 DIAGNOSIS — N183 Chronic kidney disease, stage 3 unspecified: Secondary | ICD-10-CM

## 2014-03-13 DIAGNOSIS — E7800 Pure hypercholesterolemia, unspecified: Secondary | ICD-10-CM

## 2014-03-13 DIAGNOSIS — G8114 Spastic hemiplegia affecting left nondominant side: Secondary | ICD-10-CM

## 2014-03-13 NOTE — Progress Notes (Signed)
Patient ID: [name], female   DOB: 11/28/1960, 53 y.o.   MRN: [id number]    Nursing Home Location:  [facility] of Service: SNF (31)  PCP: REED, TIFFANY, DO  Allergies  Allergen Reactions  . Vasotec Other (See Comments)    Causes angioedema    Chief Complaint  Patient presents with  . Medical Management of Chronic Issues    HPI:  53 year old female with a pmh of astrocytoma brain tumor, anxiety, seizures, dysphagia who is being seen for a routine follow up. Pt does not offer any ROS or HPI today. Nursing reports pain is better controlled with increase in gabapentin. Nursing does not have any concerns at this time. Reports chronic conditions have been without changes.    Review of Systems:  Review of Systems  Unable to perform ROS: Other  does not offer information for ROS  Past Medical History  Diagnosis Date  . 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 Fracture, ankle 2012    right  . History of radiation therapy 09/23/10- 11/07/10    right parietal lobe  . Chronic headaches     uses oxycodone appropriately  . Anxiety   . Muscle spasticity   . Seizures   . Dysphagia    Past Surgical History  Procedure Laterality Date  . Orif ankle fracture  08/22/2010    right  . Radiology with anesthesia N/A 05/02/2013    Procedure: RADIOLOGY WITH ANESTHESIA;  Surgeon: Consuella Lose, MD;  Location: [facility];  Service: Radiology;  Laterality: N/A;  . Brain surgery Right 2012    astrocytoma R parietal lobe   Social History:   reports that she has never smoked. She has never used smokeless tobacco. She reports that she drinks alcohol. She reports that she does not use illicit drugs.  Family History  Problem Relation Age of Onset  . Breast cancer Mother     Medications: Patient's Medications  New Prescriptions   No medications on file  Previous Medications   ACETAMINOPHEN (TYLENOL) 325 MG TABLET    Take 650 mg by mouth every  4 (four) hours as needed.   ASPIRIN 325 MG EC TABLET    Take 325 mg by mouth daily.   FLUOXETINE (PROZAC) 20 MG CAPSULE    Take 60 mg by mouth daily.   GABAPENTIN (NEURONTIN) 100 MG CAPSULE    Take 300 mg by mouth 2 (two) times daily. Every morning and at bedtime for left side pain   HYDROCORTISONE (ANUSOL-HC) 2.5 % RECTAL CREAM    Place 1 application rectally 2 (two) times daily.   LACOSAMIDE (VIMPAT) 50 MG TABS TABLET    Take one tablet by mouth twice daily   LAMOTRIGINE (LAMICTAL) 25 MG TABLET    Take 100 mg by mouth daily.    LEVETIRACETAM (KEPPRA) 500 MG TABLET    Take 1 tablet (500 mg total) by mouth every 12 (twelve) hours.   LORAZEPAM (ATIVAN) 0.5 MG TABLET    Take 1 tablet (0.5 mg total) by mouth every 6 (six) hours as needed for anxiety (for anxiety and agitation).   MAGNESIUM HYDROXIDE (MILK OF MAGNESIA) 400 MG/5ML SUSPENSION    Take 30 mLs by mouth daily as needed for mild constipation.   MULTIPLE VITAMIN (MULTIVITAMIN) TABLET    Take 1 tablet by mouth daily.     PANTOPRAZOLE (PROTONIX) 40 MG TABLET    Take 40 mg by mouth daily.  RISPERIDONE (RISPERDAL) 1 MG TABLET    Take 1 tablet (1 mg total) by mouth at bedtime.  Modified Medications   No medications on file  Discontinued Medications   No medications on file     Physical Exam: Filed Vitals:   03/13/14 1133  BP: 122/74  Pulse: 68  Temp: 97 F (36.1 C)  Resp: 20    Physical Exam  Constitutional: She appears well-developed and well-nourished. No distress.  HENT:  Head: Normocephalic and atraumatic.  Cardiovascular: Normal rate, regular rhythm, normal heart sounds and intact distal pulses.   Pulmonary/Chest: Effort normal and breath sounds normal. No respiratory distress.  Abdominal: Soft. Bowel sounds are normal. She exhibits no distension and no mass. There is no tenderness.  Musculoskeletal: She exhibits no edema and no tenderness.  Neurological: She is alert. She exhibits abnormal muscle tone.   Left hemiparesis   Skin: Skin is warm.  Psychiatric:  Flat affect    Labs reviewed: Basic Metabolic Panel:  Recent Labs  05/27/13 0505 05/28/13 0603 05/29/13 0728  07/05/13 1310 07/08/13 0453 07/11/13 0420 01/06/14 0840  NA 144 142 139  < > 142 140 141  --   K 3.8 4.1 4.9  < > 3.9 3.7 4.1  --   CL 113* 113* 111  < > 107 110 108  --   CO2 23 22 18*  < > 24 19 22   --   GLUCOSE 137* 130* 134*  < > 120* 105* 90  --   BUN 21 19 18   < > 19 19 13   --   CREATININE 1.18* 1.17* 1.22*  < > 1.18* 1.17* 1.03 1.50*  CALCIUM 10.2 10.2 9.7  < > 9.8 8.9 9.3  --   MG 1.7 1.7 1.8  --   --   --   --   --   < > = values in this interval not displayed. Liver Function Tests:  Recent Labs  05/27/13 0505 05/28/13 0603 05/29/13 0728  AST 28 32 30  ALT 34 38* 34  ALKPHOS 100 104 119*  BILITOT 0.7 0.6 0.5  PROT 5.7* 5.6* 5.5*  ALBUMIN 3.0* 2.9* 2.7*   No results found for this basename: LIPASE, AMYLASE,  in the last 8760 hours  Recent Labs  05/24/13 2010 07/05/13 1519  AMMONIA 63* 52   CBC:  Recent Labs  05/28/13 0603 05/29/13 0810 07/05/13 1310 07/08/13 0453 07/09/13 0524 07/11/13 0420  WBC 4.4 5.5 2.8* 3.0* 2.5* 3.3*  NEUTROABS 3.0 3.3 1.6*  --   --   --   HGB 10.0* 10.5* 9.7* 8.0* 8.2* 8.3*  HCT 29.5* 30.3* 27.1* 23.4* 23.7* 24.5*  MCV 92.2 90.7 89.1 90.0 90.5 90.4  PLT 182 178 120* 114* 108* 138*   TSH:  Recent Labs  05/25/13 0320  TSH 0.862   A1C: Lab Results  Component Value Date   HGBA1C 4.8 05/25/2013   Lipid Panel:  Recent Labs  08/25/13  CHOL 179  HDL 48  LDLCALC 118  TRIG 66     Assessment/Plan 1. CKD (chronic kidney disease) stage 3, GFR 30-59 ml/min -avoding NSADS, follow up bmp   2. Depression with anxiety Following with psych services. conts on Lamictal, ativan, Risperdal and prozac   -will get A1c  3. Seizures No noted seizures, cont on vimpat and keppra   4. Left spastic hemiparesis Increase in gabapentin has been beneficial per staff, will  cont current medications  5. Elevated LDL -will follow up lipids

## 2014-05-02 ENCOUNTER — Non-Acute Institutional Stay (SKILLED_NURSING_FACILITY): Payer: PRIVATE HEALTH INSURANCE | Admitting: Adult Health

## 2014-05-02 DIAGNOSIS — F418 Other specified anxiety disorders: Secondary | ICD-10-CM

## 2014-05-02 DIAGNOSIS — E785 Hyperlipidemia, unspecified: Secondary | ICD-10-CM

## 2014-05-02 DIAGNOSIS — F29 Unspecified psychosis not due to a substance or known physiological condition: Secondary | ICD-10-CM

## 2014-05-02 DIAGNOSIS — G8110 Spastic hemiplegia affecting unspecified side: Secondary | ICD-10-CM | POA: Diagnosis not present

## 2014-05-02 DIAGNOSIS — R569 Unspecified convulsions: Secondary | ICD-10-CM | POA: Diagnosis not present

## 2014-05-02 DIAGNOSIS — G8114 Spastic hemiplegia affecting left nondominant side: Secondary | ICD-10-CM

## 2014-05-06 ENCOUNTER — Encounter: Payer: Self-pay | Admitting: Adult Health

## 2014-05-06 DIAGNOSIS — E785 Hyperlipidemia, unspecified: Secondary | ICD-10-CM | POA: Insufficient documentation

## 2014-05-06 NOTE — Progress Notes (Signed)
Patient ID: [name], female   DOB: 30-Jul-1960, 53 y.o.   MRN: [id number]  [facility]     Allergies  Allergen Reactions  . Vasotec Other (See Comments)    Causes angioedema       Chief Complaint  Patient presents with  . Medical Management of Chronic Issues    HPI:  She is a long term resident of this facility being seen for the management of her chronic illnesses. Her status remains without change. She is unable to participate in the hpi or ros. There are no nursing concerns being voiced at this time.    Past Medical History  Diagnosis Date  . 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 Fracture, ankle 2012    right  . History of radiation therapy 09/23/10- 11/07/10    right parietal lobe  . Chronic headaches     uses oxycodone appropriately  . Anxiety   . Muscle spasticity   . Seizures   . Dysphagia     Past Surgical History  Procedure Laterality Date  . Orif ankle fracture  08/22/2010    right  . Radiology with anesthesia N/A 05/02/2013    Procedure: RADIOLOGY WITH ANESTHESIA;  Surgeon: Consuella Lose, MD;  Location: [place];  Service: Radiology;  Laterality: N/A;  . Brain surgery Right 2012    astrocytoma R parietal lobe    VITAL SIGNS BP 118/84 mmHg  Pulse 78  Ht 5\' 4"  (1.626 m)  Wt 152 lb (68.947 kg)  BMI 26.08 kg/m2  SpO2 98%  LMP 08/26/2010   Outpatient Encounter Prescriptions as of 05/02/2014  Medication Sig  . acetaminophen (TYLENOL) 325 MG tablet Take 650 mg by mouth every 4 (four) hours as needed.  Marland Kitchen aspirin 325 MG EC tablet Take 325 mg by mouth daily.  Marland Kitchen FLUoxetine (PROZAC) 20 MG capsule Take 60 mg by mouth daily.  Marland Kitchen gabapentin (NEURONTIN) 100 MG capsule Take 300 mg by mouth 2 (two) times daily. Every morning and at bedtime for left side pain  . hydrocortisone (ANUSOL-HC) 2.5 % rectal cream Place 1 application rectally 2 (two) times daily.  Marland Kitchen lacosamide (VIMPAT) 50 MG TABS tablet Take one tablet by mouth twice daily  .  lamoTRIgine (LAMICTAL) 25 MG tablet Take 150 mg by mouth daily.   Marland Kitchen levETIRAcetam (KEPPRA) 500 MG tablet Take 1 tablet (500 mg total) by mouth every 12 (twelve) hours.  . magnesium hydroxide (MILK OF MAGNESIA) 400 MG/5ML suspension Take 30 mLs by mouth daily as needed for mild constipation.  . Multiple Vitamin (MULTIVITAMIN) tablet Take 1 tablet by mouth daily.    . pantoprazole (PROTONIX) 40 MG tablet Take 40 mg by mouth daily.  . risperiDONE (RISPERDAL) 1 MG tablet Take 1 tablet (1 mg total) by mouth at bedtime.  . [DISCONTINUED] LORazepam (ATIVAN) 0.5 MG tablet Take 1 tablet (0.5 mg total) by mouth every 6 (six) hours as needed for anxiety (for anxiety and agitation). (Patient not taking: Reported on 05/06/2014)     SIGNIFICANT DIAGNOSTIC EXAMS  LABS REVIEWED:  03-14-14: wbc 5.0; hgb 10.6; hct 34.2; mcv 83.5; plt 130; glucose 83; bun 27; creat 1.48; k+4.4; na++145; ca++ 10.6; chol 196; ldl 122; trig 129; hgb a1c 5.0    Review of Systems  Unable to perform ROS    Physical Exam  Constitutional: She appears well-developed and well-nourished. No distress.  Neck: Neck supple. No JVD present. No thyromegaly present.  Cardiovascular: Normal rate, regular rhythm and intact  distal pulses.   Respiratory: Effort normal and breath sounds normal. No respiratory distress. She has no wheezes.  GI: Soft. Bowel sounds are normal. She exhibits no distension. There is no tenderness.  Musculoskeletal: She exhibits no edema.  Left side is flaccid Left facial drooping  Neurological: She is alert.  Skin: Skin is warm and dry. She is not diaphoretic.       ASSESSMENT/ PLAN:  1. Seizure; no reports of seizure activity present; will continue keppra 500 mg twice daily and vimpat 50 mg twice daily will monitor   2. Left side hemiparesis: is without change will continue asa 325 mg daily; and will continue neurontin 300 mg twice daily for pain management   3. Psychosis: no reports of behavioral  issues present; will continue risperdal 1 mg nightly and will continue lamictal 150 mg daily to help stabilize mood  4. Depression with anxiety: will continue prozac 60 mg daily will not make changes will monitor her status.   5. Dyslipidemia: her ldl is 122; will being zocor 20 mg daily and will check ;lipids and liver function in 6 weeks will monitor      Ok Edwards NP [facility]  Contact [phone] Monday through Friday 8am- 5pm  After hours call [phone]

## 2014-06-09 ENCOUNTER — Non-Acute Institutional Stay (SKILLED_NURSING_FACILITY): Payer: Medicare Other | Admitting: Adult Health

## 2014-06-09 DIAGNOSIS — E785 Hyperlipidemia, unspecified: Secondary | ICD-10-CM | POA: Diagnosis not present

## 2014-06-09 DIAGNOSIS — R569 Unspecified convulsions: Secondary | ICD-10-CM | POA: Diagnosis not present

## 2014-06-09 DIAGNOSIS — N183 Chronic kidney disease, stage 3 unspecified: Secondary | ICD-10-CM

## 2014-06-09 DIAGNOSIS — F29 Unspecified psychosis not due to a substance or known physiological condition: Secondary | ICD-10-CM

## 2014-06-09 DIAGNOSIS — G8110 Spastic hemiplegia affecting unspecified side: Secondary | ICD-10-CM

## 2014-06-09 DIAGNOSIS — F418 Other specified anxiety disorders: Secondary | ICD-10-CM

## 2014-06-09 DIAGNOSIS — C719 Malignant neoplasm of brain, unspecified: Secondary | ICD-10-CM

## 2014-06-09 DIAGNOSIS — G8114 Spastic hemiplegia affecting left nondominant side: Secondary | ICD-10-CM

## 2014-06-30 ENCOUNTER — Encounter: Payer: Self-pay | Admitting: Adult Health

## 2014-06-30 NOTE — Progress Notes (Signed)
Patient ID: [name], female   DOB: 1960/06/26, 54 y.o.   MRN: [id number]  [facility]     Allergies  Allergen Reactions  . Vasotec Other (See Comments)    Causes angioedema       Chief Complaint  Patient presents with  . Medical Management of Chronic Issues    HPI:  She is a long term resident of this facility being seen for the management of his chronic illnesses. She has had her left great toe nail traumatically nearly removed.  I have trimmed her nails and removed as much of the left great toe nail as possible. She is unable to fully participate in the hpi or ros; she is denying pain at this time.    Past Medical History  Diagnosis Date  . 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 Fracture, ankle 2012    right  . History of radiation therapy 09/23/10- 11/07/10    right parietal lobe  . Chronic headaches     uses oxycodone appropriately  . Anxiety   . Muscle spasticity   . Seizures   . Dysphagia     Past Surgical History  Procedure Laterality Date  . Orif ankle fracture  08/22/2010    right  . Radiology with anesthesia N/A 05/02/2013    Procedure: RADIOLOGY WITH ANESTHESIA;  Surgeon: Consuella Lose, MD;  Location: [place];  Service: Radiology;  Laterality: N/A;  . Brain surgery Right 2012    astrocytoma R parietal lobe    VITAL SIGNS BP 128/75 mmHg  Pulse 70  Ht 5\' 4"  (1.626 m)  Wt 153 lb (69.4 kg)  BMI 26.25 kg/m2  SpO2 94%  LMP 08/26/2010   Outpatient Encounter Prescriptions as of 06/09/2014  Medication Sig  . acetaminophen (TYLENOL) 325 MG tablet Take 650 mg by mouth every 4 (four) hours as needed.  Marland Kitchen aspirin 325 MG EC tablet Take 325 mg by mouth daily.  Marland Kitchen FLUoxetine (PROZAC) 20 MG capsule Take 60 mg by mouth daily.  Marland Kitchen gabapentin (NEURONTIN) 100 MG capsule Take 300 mg by mouth 2 (two) times daily. Every morning and at bedtime for left side pain  . hydrocortisone (ANUSOL-HC) 2.5 % rectal cream Place 1 application rectally 2 (two)  times daily.  Marland Kitchen lacosamide (VIMPAT) 50 MG TABS tablet Take one tablet by mouth twice daily  . lamoTRIgine (LAMICTAL) 25 MG tablet Take 50-100 mg by mouth 2 (two) times daily. Takes 50 mg in the AM and 100 mg in the PM  . levETIRAcetam (KEPPRA) 500 MG tablet Take 1 tablet (500 mg total) by mouth every 12 (twelve) hours.  . magnesium hydroxide (MILK OF MAGNESIA) 400 MG/5ML suspension Take 30 mLs by mouth daily as needed for mild constipation.  . Multiple Vitamin (MULTIVITAMIN) tablet Take 1 tablet by mouth daily.    . pantoprazole (PROTONIX) 40 MG tablet Take 40 mg by mouth daily.  . risperiDONE (RISPERDAL) 1 MG tablet Take 1 tablet (1 mg total) by mouth at bedtime.  . simvastatin (ZOCOR) 20 MG tablet Take 20 mg by mouth daily at 6 PM.     SIGNIFICANT DIAGNOSTIC EXAMS   LABS REVIEWED:  03-14-14: wbc 5.0; hgb 10.6; hct 34.2; mcv 83.5; plt 130; glucose 83; bun 27; creat 1.48; k+4.4; na++145; ca++ 10.6; chol 196; ldl 122; trig 129; hgb a1c 5.0       Review of Systems  Unable to perform ROS    Physical Exam Constitutional: She appears well-developed and  well-nourished. No distress.  Neck: Neck supple. No JVD present. No thyromegaly present.  Cardiovascular: Normal rate, regular rhythm and intact distal pulses.   Respiratory: Effort normal and breath sounds normal. No respiratory distress. She has no wheezes.  GI: Soft. Bowel sounds are normal. She exhibits no distension. There is no tenderness.  Musculoskeletal: She exhibits no edema.  Left side is flaccid Left facial drooping  Neurological: She is alert.  Skin: Skin is warm and dry. She is not diaphoretic.  Left great toenail trimmed as far as possible; bleeding has stopped; will use abt oint per facility protocol and will monitor     ASSESSMENT/ PLAN:  1. Seizure; no reports of seizure activity present; will continue keppra 500 mg twice daily and vimpat 50 mg twice daily will monitor   2. Left side spastic  hemiparesis: is  without change will continue asa 325 mg daily; and will continue neurontin 300 mg twice daily for pain management   3. Psychosis: no reports of behavioral issues present; will continue risperdal 1 mg nightly and will continue lamictal 50 mg in the am and 100 mg in the pm to help stabilize mood; is followed by psych services   4. Depression with anxiety: she does continue to receive benefit from prozac 60 mg daily  will not make changes will monitor her status.   5. Dyslipidemia: her ldl is 122; will continue zocor 20 mg daily   6. Astrocytoma brain tumor: is status post radiation therapy; is without change in status; will monitor  7. CKD stage III: no change in status; creat is 1.48; will monitor     Ok Edwards NP [facility]  Contact [phone] Monday through Friday 8am- 5pm  After hours call [phone]

## 2014-07-06 ENCOUNTER — Other Ambulatory Visit: Payer: Self-pay | Admitting: Radiation Therapy

## 2014-07-06 DIAGNOSIS — C719 Malignant neoplasm of brain, unspecified: Secondary | ICD-10-CM

## 2014-07-12 ENCOUNTER — Other Ambulatory Visit: Payer: Self-pay | Admitting: *Deleted

## 2014-07-12 MED ORDER — LACOSAMIDE 50 MG PO TABS: ORAL_TABLET | ORAL | Status: DC

## 2014-07-12 NOTE — Telephone Encounter (Signed)
[facility] 

## 2014-07-17 ENCOUNTER — Other Ambulatory Visit: Payer: Self-pay | Admitting: *Deleted

## 2014-07-17 MED ORDER — LACOSAMIDE 50 MG PO TABS: ORAL_TABLET | ORAL | Status: DC

## 2014-07-17 NOTE — Telephone Encounter (Signed)
[facility] 

## 2014-07-20 ENCOUNTER — Encounter: Payer: Self-pay | Admitting: Adult Health

## 2014-07-20 ENCOUNTER — Non-Acute Institutional Stay (SKILLED_NURSING_FACILITY): Payer: Medicare Other | Admitting: Adult Health

## 2014-07-20 DIAGNOSIS — N183 Chronic kidney disease, stage 3 unspecified: Secondary | ICD-10-CM

## 2014-07-20 DIAGNOSIS — F418 Other specified anxiety disorders: Secondary | ICD-10-CM | POA: Diagnosis not present

## 2014-07-20 DIAGNOSIS — R569 Unspecified convulsions: Secondary | ICD-10-CM | POA: Diagnosis not present

## 2014-07-20 DIAGNOSIS — E785 Hyperlipidemia, unspecified: Secondary | ICD-10-CM

## 2014-07-20 DIAGNOSIS — G8110 Spastic hemiplegia affecting unspecified side: Secondary | ICD-10-CM | POA: Diagnosis not present

## 2014-07-20 DIAGNOSIS — G8114 Spastic hemiplegia affecting left nondominant side: Secondary | ICD-10-CM

## 2014-07-20 DIAGNOSIS — C719 Malignant neoplasm of brain, unspecified: Secondary | ICD-10-CM

## 2014-07-20 DIAGNOSIS — F29 Unspecified psychosis not due to a substance or known physiological condition: Secondary | ICD-10-CM

## 2014-07-20 NOTE — Progress Notes (Signed)
Patient ID: [name], female   DOB: [birth year], 54 y.o.   MRN: [id number]  [facility]     Allergies  Allergen Reactions  . Vasotec Other (See Comments)    Causes angioedema       Chief Complaint  Patient presents with  . Annual Exam    HPI:  She is a long term resident of this facility being seen for her annual exam. Her status remains stable. It has been several months since she has been hospitalized. She is unable to full participate in the hpi or ros; but does complain of pain in her left leg and left arm. She is interested in a [place]; female exam. There are no nursing concerns at this time.    Past Medical History  Diagnosis Date  . 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 Fracture, ankle 2012    right  . History of radiation therapy 09/23/10- 11/07/10    right parietal lobe  . Chronic headaches     uses oxycodone appropriately  . Anxiety   . Muscle spasticity   . Seizures   . Dysphagia     Past Surgical History  Procedure Laterality Date  . Orif ankle fracture  08/22/2010    right  . Radiology with anesthesia N/A 05/02/2013    Procedure: RADIOLOGY WITH ANESTHESIA;  Surgeon: Consuella Lose, MD;  Location: [place];  Service: Radiology;  Laterality: N/A;  . Brain surgery Right 2012    astrocytoma R parietal lobe    VITAL SIGNS BP 118/84 mmHg  Pulse 78  Ht 5\' 3"  (1.6 m)  Wt 156 lb (70.761 kg)  BMI 27.64 kg/m2  SpO2 98%  LMP 08/26/2010   Outpatient Encounter Prescriptions as of 07/20/2014  Medication Sig  . acetaminophen (TYLENOL) 325 MG tablet Take 650 mg by mouth every 4 (four) hours as needed.  . ARIPiprazole (ABILIFY) 2 MG tablet Take 2 mg by mouth daily.  Marland Kitchen aspirin 325 MG EC tablet Take 325 mg by mouth daily.  Marland Kitchen atorvastatin (LIPITOR) 10 MG tablet Take 10 mg by mouth daily.  Marland Kitchen FLUoxetine (PROZAC) 20 MG capsule Take 60 mg by mouth daily.  Marland Kitchen gabapentin (NEURONTIN) 100 MG capsule Take 300 mg by mouth 2 (two) times daily. Every  morning and at bedtime for left side pain  . hydrocortisone (ANUSOL-HC) 2.5 % rectal cream Place 1 application rectally 2 (two) times daily.  Marland Kitchen lacosamide (VIMPAT) 50 MG TABS tablet Take one tablet by mouth twice daily  . lamoTRIgine (LAMICTAL) 25 MG tablet Take 150 mg by mouth daily.   Marland Kitchen levETIRAcetam (KEPPRA) 500 MG tablet Take 1 tablet (500 mg total) by mouth every 12 (twelve) hours.  . magnesium hydroxide (MILK OF MAGNESIA) 400 MG/5ML suspension Take 30 mLs by mouth daily as needed for mild constipation.  . Multiple Vitamin (MULTIVITAMIN) tablet Take 1 tablet by mouth daily.    . pantoprazole (PROTONIX) 40 MG tablet Take 40 mg by mouth daily.  . risperiDONE (RISPERDAL) 1 MG tablet Take 1 tablet (1 mg total) by mouth at bedtime. (Patient taking differently: Take 0.75 mg by mouth at bedtime. )     SIGNIFICANT DIAGNOSTIC EXAMS    LABS REVIEWED:  03-14-14: wbc 5.0; hgb 10.6; hct 34.2; mcv 83.5; plt 130; glucose 83; bun 27; creat 1.48; k+4.4; na++145; ca++ 10.6; chol 196; ldl 122; trig 129; hgb a1c 5.0     Review of Systems  Unable to perform ROS    Physical  Exam  Constitutional: She appears well-developed and well-nourished. No distress.  Eyes: Pupils are equal, round, and reactive to light.  Neck: Neck supple. No JVD present. No thyromegaly present.  Cardiovascular: Normal rate, regular rhythm and intact distal pulses.   Respiratory: Effort normal and breath sounds normal. No respiratory distress.  GI: Soft. Bowel sounds are normal. She exhibits no distension.  Musculoskeletal: She exhibits no edema.  Left facial drooping  Left side flaccid Left hand in a splint    Neurological: She is alert.  Skin: Skin is warm and dry. She is not diaphoretic.       ASSESSMENT/ PLAN:  1. Seizure; no reports of seizure activity present; will continue keppra 500 mg twice daily and vimpat 50 mg twice daily will monitor   2. Left side spastic  hemiparesis: is without change will  continue asa 325 mg daily; will increase her neurontin to 300 mg three times daily for pain management and will monitor   3. Psychosis: no reports of behavioral issues present; she is being weaned off the risperdal is presently on 0.75 mg daily is also on abilify 2 mg daily; is on lamictal 150 mg daily to stabilize mood;  is followed by psych services   4. Depression with anxiety: she does continue to receive benefit from prozac 60 mg daily  will not make changes will monitor her status.   5. Dyslipidemia: her ldl is 122; will continue lipitor 10 mg daily   6. Astrocytoma brain tumor: is status post radiation therapy; is without change in status; will monitor  7. CKD stage III: no change in status; creat is 1.48; will monitor    will check hgb a1c Will setup screening mammogram;  gyn exam; and will guaiac her stool      Ok Edwards NP [facility]  Contact [phone] Monday through Friday 8am- 5pm  After hours call [phone]

## 2014-07-21 ENCOUNTER — Other Ambulatory Visit: Payer: Self-pay | Admitting: Internal Medicine

## 2014-07-21 DIAGNOSIS — Z1231 Encounter for screening mammogram for malignant neoplasm of breast: Secondary | ICD-10-CM

## 2014-07-24 ENCOUNTER — Ambulatory Visit (HOSPITAL_COMMUNITY): Admission: RE | Admit: 2014-07-24 | Payer: Medicare Other | Source: Ambulatory Visit

## 2014-07-26 ENCOUNTER — Ambulatory Visit: Payer: PRIVATE HEALTH INSURANCE | Admitting: Radiation Oncology

## 2014-07-28 ENCOUNTER — Ambulatory Visit (HOSPITAL_COMMUNITY)
Admission: RE | Admit: 2014-07-28 | Discharge: 2014-07-28 | Disposition: A | Discharge status: Home / Self Care | Payer: Medicare Other | Source: Ambulatory Visit | Attending: Radiation Oncology | Admitting: Radiation Oncology

## 2014-07-28 DIAGNOSIS — C719 Malignant neoplasm of brain, unspecified: Secondary | ICD-10-CM | POA: Diagnosis not present

## 2014-07-28 DIAGNOSIS — Z923 Personal history of irradiation: Secondary | ICD-10-CM | POA: Diagnosis not present

## 2014-07-28 DIAGNOSIS — Z8673 Personal history of transient ischemic attack (TIA), and cerebral infarction without residual deficits: Secondary | ICD-10-CM | POA: Insufficient documentation

## 2014-07-28 MED ORDER — GADOBENATE DIMEGLUMINE 529 MG/ML IV SOLN
15.0000 mL | Freq: Once | INTRAVENOUS | Status: AC | PRN
  Administered 2014-07-28: 15 mL via INTRAVENOUS

## 2014-07-31 ENCOUNTER — Encounter: Payer: Self-pay | Admitting: Radiation Oncology

## 2014-07-31 ENCOUNTER — Ambulatory Visit
Admission: RE | Admit: 2014-07-31 | Discharge: 2014-07-31 | Disposition: A | Discharge status: Home / Self Care | Payer: Medicare Other | Source: Ambulatory Visit | Attending: Radiation Oncology | Admitting: Radiation Oncology

## 2014-07-31 VITALS — BP 106/63 | HR 67 | Resp 16

## 2014-07-31 DIAGNOSIS — C719 Malignant neoplasm of brain, unspecified: Secondary | ICD-10-CM

## 2014-07-31 NOTE — Progress Notes (Signed)
Received patient via medical transport. Patient resting supine on stretcher. Patient seen briefly by Wadie Lessen, NP. Vitals stable. Reports headaches and ringing in the ears. Reports her left leg and hand hurt often. Unable to describe frequency or intensity of pain or headaches. Denies nausea, vomiting, dizziness, diplopia or floaters. Not taking decadron. Continues Keppra 500 mg bid.

## 2014-07-31 NOTE — Progress Notes (Signed)
Radiation Oncology         [phone]   Name: [name]   Date: 07/31/2014   MRN: [id number]  DOB: 05-Aug-1960    Multidisciplinary Brain and Spine Oncology Clinic Follow-Up Visit Note  CC: [name], DO  [name] [facility]    ICD-9-CM ICD-10-CM   1. 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  The recent films were presented in our multidisciplinary conference with neuroradiology just prior to the clinic.  Patient arrived via medical transport. Patient resting supine on stretcher. Patient seen briefly by Wadie Lessen, NP. Vitals stable. Reports headaches and ringing in the ears. Reports her left leg and hand hurt often. Unable to describe frequency or intensity of pain or headaches. Denies nausea, vomiting, dizziness, diplopia or floaters. Not taking decadron. Continues Keppra 500 mg bid                              ALLERGIES:  is allergic to vasotec.  Meds: Current Outpatient Prescriptions  Medication Sig Dispense Refill  . acetaminophen (TYLENOL) 325 MG tablet Take 650 mg by mouth every 4 (four) hours as needed.    . ARIPiprazole (ABILIFY) 2 MG tablet Take 2 mg by mouth daily.    Marland Kitchen aspirin 325 MG EC tablet Take 325 mg by mouth daily.    Marland Kitchen atorvastatin (LIPITOR) 10 MG tablet Take 10 mg by mouth daily.    Marland Kitchen FLUoxetine (PROZAC) 20 MG capsule Take 60 mg by mouth daily.    Marland Kitchen gabapentin (NEURONTIN) 100 MG capsule Take 300 mg by mouth 2 (two) times daily. Every morning and at bedtime for left side pain    . hydrocortisone (ANUSOL-HC) 2.5 % rectal cream Place 1 application rectally 2 (two) times daily.    Marland Kitchen lacosamide (VIMPAT)  50 MG TABS tablet Take one tablet by mouth twice daily 60 tablet 5  . lamoTRIgine (LAMICTAL) 25 MG tablet Take 150 mg by mouth daily.     Marland Kitchen levETIRAcetam (KEPPRA) 500 MG tablet Take 1 tablet (500 mg total) by mouth every 12 (twelve) hours. 60 tablet 5  . magnesium hydroxide (MILK OF MAGNESIA) 400 MG/5ML suspension Take 30 mLs by mouth daily as needed for mild constipation.    . Multiple Vitamin (MULTIVITAMIN) tablet Take 1 tablet by mouth daily.      . pantoprazole (PROTONIX) 40 MG tablet Take 40 mg by mouth daily.    . risperiDONE (RISPERDAL) 1 MG tablet Take 1 tablet (1 mg total) by mouth at bedtime. (Patient taking differently: Take 0.75 mg by mouth at bedtime. )     No current facility-administered medications for this encounter.    Physical Findings: The patient is in no acute distress. Patient has flat affect and appears chronically ill.   blood pressure is 106/63 and her pulse is 67. Her respiration is 16. Marland Kitchen  Her left upper extremity is MS is 0/5.  Right UE MS is 4/5, and bilat LE's 2-4/5.  Light touch intact in limbs.  Lab Findings: Lab Results  Component Value Date   WBC 3.3* 07/11/2013  HGB 8.3* 07/11/2013   HCT 24.5* 07/11/2013   MCV 90.4 07/11/2013   PLT 138* 07/11/2013    @LASTCHEM @  Radiographic Findings: Mr Jeri Cos [place] Contrast  07/28/2014   CLINICAL DATA:  Astrocytoma.  Status post radiation therapy.  EXAM: MRI HEAD WITHOUT AND WITH CONTRAST  TECHNIQUE: Multiplanar, multiecho pulse sequences of the brain and surrounding structures were obtained without and with intravenous contrast.  CONTRAST:  73mL MULTIHANCE GADOBENATE DIMEGLUMINE 529 MG/ML IV SOLN  COMPARISON:  MRI brain multiple prior MRIs the brain, most recently 01/06/2014.  FINDINGS: The right parietal resection cavity is similar in size the prior studies. Restricted diffusion at the superior aspect of the cavity continues to diminished. A remote right MCA territory infarct is stable.  Dural thickening over the  right convexity with enhancement is stable. Extensive T2 changes in the right hemisphere are similar to the prior study as well.  Asymmetric enlargement of the right lateral ventricle is secondary to expected dilation. Slight left-to-right midline shift is stable.  Mild periventricular white matter changes on the left are unchanged.  There remains a hematocrit level within the surgical cavity. 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  Midline structures are within normal limits. Flow is present in the major intracranial arteries.  The globes and orbits are intact. Mild mucosal thickening is present in the left maxillary sinus. The mastoid air cells are clear.  The postcontrast images demonstrate stable enhancement of the dural thickening over the right convexity. There is stable enhancement along the superior aspect of the resection cavity is well.  IMPRESSION: 1. Stable postoperative changes the right parietal lobe with a persistent hematocrit level in some enhancement along the superior margin of the surgical cavity without significant interval change. The areas of enhancement correspond to the infarct of 2014. There is persistent enhancement along the more inferior areas of the right MCA territory infarct is well. 2. 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 3. Diffuse dural enhancement over the right convexity is stable. 4. Significant signal loss in the right hemisphere is unchanged.   Electronically Signed   By: San Morelle M.D.   On: 07/28/2014 19:12    Impression:  The patient has no evidence of recurrent brain tumor.  She is very debilitated by her right MCA infarct.  Plan:  MRI and follow-up in 6 months.  _____________________________________  Sheral Apley. [name], M.D.

## 2014-08-04 ENCOUNTER — Ambulatory Visit
Admission: RE | Admit: 2014-08-04 | Discharge: 2014-08-04 | Disposition: A | Discharge status: Home / Self Care | Payer: PRIVATE HEALTH INSURANCE | Source: Ambulatory Visit | Attending: Internal Medicine | Admitting: Internal Medicine

## 2014-08-04 DIAGNOSIS — Z1231 Encounter for screening mammogram for malignant neoplasm of breast: Secondary | ICD-10-CM

## 2014-08-07 ENCOUNTER — Ambulatory Visit: Payer: Medicare Other | Admitting: Radiation Oncology

## 2014-08-28 ENCOUNTER — Other Ambulatory Visit: Payer: Self-pay | Admitting: Radiation Therapy

## 2014-08-28 DIAGNOSIS — C719 Malignant neoplasm of brain, unspecified: Secondary | ICD-10-CM

## 2014-09-12 ENCOUNTER — Non-Acute Institutional Stay (SKILLED_NURSING_FACILITY): Payer: Medicare Other | Admitting: Adult Health

## 2014-09-12 DIAGNOSIS — F418 Other specified anxiety disorders: Secondary | ICD-10-CM

## 2014-09-12 DIAGNOSIS — F29 Unspecified psychosis not due to a substance or known physiological condition: Secondary | ICD-10-CM

## 2014-09-12 DIAGNOSIS — C719 Malignant neoplasm of brain, unspecified: Secondary | ICD-10-CM

## 2014-09-12 DIAGNOSIS — G8110 Spastic hemiplegia affecting unspecified side: Secondary | ICD-10-CM | POA: Diagnosis not present

## 2014-09-12 DIAGNOSIS — R569 Unspecified convulsions: Secondary | ICD-10-CM

## 2014-09-12 DIAGNOSIS — G8114 Spastic hemiplegia affecting left nondominant side: Secondary | ICD-10-CM

## 2014-09-27 ENCOUNTER — Encounter: Payer: Self-pay | Admitting: *Deleted

## 2014-10-05 ENCOUNTER — Non-Acute Institutional Stay (SKILLED_NURSING_FACILITY): Payer: Medicare Other | Admitting: Internal Medicine

## 2014-10-05 DIAGNOSIS — E43 Unspecified severe protein-calorie malnutrition: Secondary | ICD-10-CM | POA: Diagnosis not present

## 2014-10-05 DIAGNOSIS — I69359 Hemiplegia and hemiparesis following cerebral infarction affecting unspecified side: Secondary | ICD-10-CM

## 2014-10-05 DIAGNOSIS — F29 Unspecified psychosis not due to a substance or known physiological condition: Secondary | ICD-10-CM

## 2014-10-05 DIAGNOSIS — I69959 Hemiplegia and hemiparesis following unspecified cerebrovascular disease affecting unspecified side: Secondary | ICD-10-CM | POA: Diagnosis not present

## 2014-10-05 DIAGNOSIS — F418 Other specified anxiety disorders: Secondary | ICD-10-CM

## 2014-10-05 DIAGNOSIS — N183 Chronic kidney disease, stage 3 unspecified: Secondary | ICD-10-CM

## 2014-10-05 DIAGNOSIS — Z85841 Personal history of malignant neoplasm of brain: Secondary | ICD-10-CM | POA: Diagnosis not present

## 2014-10-05 DIAGNOSIS — E785 Hyperlipidemia, unspecified: Secondary | ICD-10-CM | POA: Diagnosis not present

## 2014-10-05 DIAGNOSIS — R569 Unspecified convulsions: Secondary | ICD-10-CM

## 2014-10-05 DIAGNOSIS — F482 Pseudobulbar affect: Secondary | ICD-10-CM | POA: Diagnosis not present

## 2014-10-05 DIAGNOSIS — K219 Gastro-esophageal reflux disease without esophagitis: Secondary | ICD-10-CM

## 2014-10-10 ENCOUNTER — Encounter: Payer: Self-pay | Admitting: Internal Medicine

## 2014-10-10 NOTE — Progress Notes (Signed)
Patient ID: [name], female   DOB: [birth year], 54 y.o.   MRN: [id number]    DATE:  10/05/14  Location:  [facility]    Place of Service: [phone])   Extended Emergency Contact Information Primary Emergency Contact: Mullenbach,Tim Address: [address]          [place], [place] [address] [place] Phone: [phone] Mobile Phone: [phone] Relation: Spouse Secondary Emergency Contact: Kimberlee [place] Mobile Phone: [phone] Relation: Father  Advanced Directive information  FULL CODE  Chief Complaint  Patient presents with  . Medical Management of Chronic Issues    HPI:  54 yo female long term resident seen today for f/u. She c/o feeling depressed x several weeks. No crying spells. Denies SI/HI. Nursing c/a pt's mental status and would like her to be considered for neudexta tx. No other concerns. Appetite is ok. Sleeping well. No recent falls. She is a poor historian due to psych issues. Hx obtained from chart and nursing  She has dysphagia and is followed by ST. She has a hx SAH with left hemiparesis. No anticoagulation except ASA due to [facility]. She has appt for MRI in August to eval brain anatomy  She takes abilify, prozac and vimpat for her mood.  No seizures on lamictal and keppra.   Cholesterol controlled on lipitor. No myalgias  Acid reflux stable on protonix  Past Medical History  Diagnosis Date  . 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 Fracture, ankle 2012    right  . History of radiation therapy 09/23/10- 11/07/10    right parietal lobe  . Chronic headaches     uses oxycodone appropriately  . Anxiety   . Muscle spasticity   . Seizures   . Dysphagia     Past Surgical History  Procedure Laterality Date  . Orif ankle fracture  08/22/2010    right  . Radiology with anesthesia N/A 05/02/2013    Procedure: RADIOLOGY WITH ANESTHESIA;  Surgeon: Consuella Lose, MD;  Location: [place];  Service: Radiology;  Laterality: N/A;  . Brain surgery Right 2012    astrocytoma R parietal lobe    Patient Care Team: [name], DO as PCP - General (Internal Medicine) Gerlene Fee, NP as Nurse Practitioner (Nurse Practitioner)  History   Social History  . Marital Status: Married    Spouse Name: N/A  . Number of Children: 1  . Years of Education: N/A   Occupational History  .      unemployed   Social History Main Topics  . Smoking status: Never Smoker   . Smokeless tobacco: Never Used  . Alcohol Use: 0.0 oz/week    3-4 Cans of beer per week  . Drug Use: No  . Sexual Activity: No   Other Topics Concern  . Not on file   Social History Narrative     reports that she has never smoked. She has never used smokeless tobacco. She reports that she drinks alcohol. She reports that she does not use illicit drugs.  Immunization History  Administered Date(s) Administered  . Influenza,inj,Quad PF,36+ Mos 05/26/2013  . Pneumococcal Polysaccharide-23 05/26/2013  . Tdap 08/30/2011    Allergies  Allergen Reactions  . Vasotec Other (See Comments)    Causes angioedema    Medications: Patient's Medications  New Prescriptions   No medications on file  Previous Medications   ACETAMINOPHEN (TYLENOL) 325 MG TABLET  Take 650 mg by mouth every 4 (four) hours as needed. For pain   ARIPIPRAZOLE (ABILIFY) 5 MG TABLET    Take 7 mg by mouth daily. For psychosis   ASPIRIN 325 MG EC TABLET    Take 325 mg by mouth daily. For cerebral aneurysm   ATORVASTATIN (LIPITOR) 10 MG TABLET    Take 10 mg by mouth daily. For lipoproteins   FLUOXETINE (PROZAC) 20 MG CAPSULE    Take 60 mg by mouth daily.   GABAPENTIN (NEURONTIN) 100 MG CAPSULE    Take 300 mg by mouth 2 (two) times daily. Every morning and at bedtime for left side pain   GABAPENTIN (NEURONTIN) 300 MG CAPSULE    Take 300 mg by mouth 3 (three) times daily.   HYDROCORTISONE (ANUSOL-HC) 2.5 % RECTAL CREAM    Place 1  application rectally 2 (two) times daily.   LACOSAMIDE (VIMPAT) 50 MG TABS TABLET    Take one tablet by mouth twice daily   LAMOTRIGINE (LAMICTAL) 100 MG TABLET    Take 100 mg by mouth daily. For mood disorder   LEVETIRACETAM (KEPPRA) 500 MG TABLET    Take 1 tablet (500 mg total) by mouth every 12 (twelve) hours.   MAGNESIUM HYDROXIDE (MILK OF MAGNESIA) 400 MG/5ML SUSPENSION    Take 30 mLs by mouth daily as needed for mild constipation.   MULTIPLE VITAMIN (MULTIVITAMIN) TABLET    Take 1 tablet by mouth daily.     PANTOPRAZOLE (PROTONIX) 40 MG TABLET    Take 40 mg by mouth daily.  Modified Medications   No medications on file  Discontinued Medications   No medications on file    Review of Systems  Unable to perform ROS: Psychiatric disorder    Filed Vitals:   10/05/14 1600  BP: 122/74  Pulse: 68  Temp: 97.7 F (36.5 C)  Weight: 154 lb (69.854 kg)  SpO2: 97%   Body mass index is 27.29 kg/(m^2).  Physical Exam  Constitutional: She appears well-developed and well-nourished. No distress.  Frail appearing sitting in w/c in NAD  HENT:  Mouth/Throat: Oropharynx is clear and moist. No oropharyngeal exudate.  Eyes: Pupils are equal, round, and reactive to light. No scleral icterus.  Neck: Neck supple. Carotid bruit is not present. No tracheal deviation present. No thyromegaly present.  Cardiovascular: Normal rate, regular rhythm, normal heart sounds and intact distal pulses.  Exam reveals no gallop and no friction rub.   No murmur heard. No LE edema b/l. no calf TTP.   Pulmonary/Chest: Effort normal and breath sounds normal. No stridor. No respiratory distress. She has no wheezes. She has no rales.  Abdominal: Soft. Bowel sounds are normal. She exhibits no distension and no mass. There is no hepatomegaly. There is no tenderness. There is no rebound and no guarding.  Lymphadenopathy:    She has no cervical adenopathy.  Neurological: She is alert.  Left hemiparesis  Skin: Skin is warm  and dry. No rash noted.  Psychiatric: Her behavior is normal. She exhibits a depressed mood.     Labs reviewed: None  No results found.   Assessment/Plan   ICD-9-CM ICD-10-CM   1. PBA (pseudobulbar affect) - new 310.81 F48.2   2. Depression with anxiety - failing to change as expected 300.4 F41.8   3. Psychosis, unspecified psychosis type - stable 298.9 F29   4. Protein-calorie malnutrition, severe 262 E43   5. Seizures - stable 780.39 R56.9   6. History of hemorrhagic stroke with residual  hemiparesis - stable 438.20 I69.959   7. Gastroesophageal reflux disease without esophagitis - controlled 530.81 K21.9   8. CKD (chronic kidney disease) stage 3, GFR 30-59 ml/min - stable 585.3 N18.3   9. Dyslipidemia - stable 272.4 E78.5   10. History of astrocytoma  V10.85 Z85.841    s/p excision and XRT in 2011    --start nuedexta 10/20 daily x 1 wk then increase to q12h for PBA  --check CMP and CBC wdiff  --cont other meds as ordered  --ST/PT/OT as indicated  --MRI as scheduled in 01/2015  --f/u with specialists as scheduled  --will follow  Vedha Tercero S. Perlie Gold  [facility] [address] [place], [place] [address] [phone] Cell (Monday-Friday 8 AM - 5 PM) [phone] After 5 PM and follow prompts

## 2014-10-12 ENCOUNTER — Non-Acute Institutional Stay (SKILLED_NURSING_FACILITY): Payer: Medicare Other | Admitting: Adult Health

## 2014-10-12 DIAGNOSIS — G8114 Spastic hemiplegia affecting left nondominant side: Secondary | ICD-10-CM

## 2014-10-12 DIAGNOSIS — G8110 Spastic hemiplegia affecting unspecified side: Secondary | ICD-10-CM | POA: Diagnosis not present

## 2014-10-26 NOTE — Progress Notes (Signed)
Patient ID: [name], female   DOB: 10-26-1960, 54 y.o.   MRN: [id number]  [facility]     Allergies  Allergen Reactions  . Vasotec Other (See Comments)    Causes angioedema       Chief Complaint  Patient presents with  . Medical Management of Chronic Issues    HPI:  She is a long term resident of this facility being seen for the management of her chronic illnesses. Overall her status is without significant change. She cannot fully participate in the hpi or ros but states that she is feeling good. There are no nursing concerns at this time.    Past Medical History  Diagnosis Date  . 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 Fracture, ankle 2012    right  . History of radiation therapy 09/23/10- 11/07/10    right parietal lobe  . Chronic headaches     uses oxycodone appropriately  . Anxiety   . Muscle spasticity   . Seizures   . Dysphagia     Past Surgical History  Procedure Laterality Date  . Orif ankle fracture  08/22/2010    right  . Radiology with anesthesia N/A 05/02/2013    Procedure: RADIOLOGY WITH ANESTHESIA;  Surgeon: Consuella Lose, MD;  Location: [place];  Service: Radiology;  Laterality: N/A;  . Brain surgery Right 2012    astrocytoma R parietal lobe    VITAL SIGNS BP 90/62 mmHg  Pulse 64  Ht 5\' 3"  (1.6 m)  Wt 156 lb (70.761 kg)  BMI 27.64 kg/m2  LMP 08/26/2010   Outpatient Encounter Prescriptions as of 09/12/2014  Medication Sig   . acetaminophen (TYLENOL) 325 MG tablet Take 650 mg by mouth every 4 (four) hours as needed.  . ARIPiprazole (ABILIFY) 2 MG tablet Take 2 mg by mouth daily.  Marland Kitchen aspirin 325 MG EC tablet Take 325 mg by mouth daily.  Marland Kitchen atorvastatin (LIPITOR) 10 MG tablet Take 10 mg by mouth daily.  Marland Kitchen FLUoxetine (PROZAC) 20 MG capsule Take 60 mg by mouth daily.  Marland Kitchen gabapentin (NEURONTIN) 100 MG capsule Take 300 mg by mouth 2 (two) times daily. Every morning and at bedtime for left side pain  . hydrocortisone  (ANUSOL-HC) 2.5 % rectal cream Place 1 application rectally 2 (two) times daily.  Marland Kitchen lacosamide (VIMPAT) 50 MG TABS tablet Take one tablet by mouth twice daily  . lamoTRIgine (LAMICTAL) 25 MG tablet Take 150 mg by mouth daily.   Marland Kitchen levETIRAcetam (KEPPRA) 500 MG tablet Take 1 tablet (500 mg total) by mouth every 12 (twelve) hours.  . magnesium hydroxide (MILK OF MAGNESIA) 400 MG/5ML suspension Take 30 mLs by mouth daily as needed for mild constipation.  . Multiple Vitamin (MULTIVITAMIN) tablet Take 1 tablet by mouth daily.    . pantoprazole (PROTONIX) 40 MG tablet Take 40 mg by mouth daily.           SIGNIFICANT DIAGNOSTIC EXAMS  07-28-14: mri of brain: 1. Stable postoperative changes the right parietal lobe with a persistent hematocrit level in some enhancement along the superior margin of the surgical cavity without significant interval change. The areas of enhancement correspond to the infarct of 2014. There is persistent enhancement along the more inferior areas of the right MCA territory infarct is well. 2. 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 3. Diffuse dural enhancement over the right convexity is stable. 4. Significant signal loss in the  right hemisphere is unchanged.   08-04-14: mammogram: No mammographic evidence of malignancy. A result letter of this screening mammogram will be mailed directly to the patient. RECOMMENDATION: Screening mammogram in one year. (Code:[id number]-B-01Y)      LABS REVIEWED:  03-14-14: wbc 5.0; hgb 10.6; hct 34.2; mcv 83.5; plt 130; glucose 83; bun 27; creat 1.48; k+4.4; na++145; ca++ 10.6; chol 196; ldl 122; trig 129; hgb a1c 5.0 07-24-14: hgb a1c 5.3    ROS Unable to perform ROS    Physical Exam Constitutional: She appears well-developed and well-nourished. No distress.  Eyes: Pupils are equal, round, and reactive to light.  Neck: Neck supple. No JVD present. No thyromegaly  present.  Cardiovascular: Normal rate, regular rhythm and intact distal pulses.   Respiratory: Effort normal and breath sounds normal. No respiratory distress.  GI: Soft. Bowel sounds are normal. She exhibits no distension.  Musculoskeletal: She exhibits no edema.  Left facial drooping  Left side flaccid Left hand in a splint    Neurological: She is alert.  Skin: Skin is warm and dry. She is not diaphoretic.     ASSESSMENT/ PLAN:   1. Seizure; no reports of seizure activity present; will continue keppra 500 mg twice daily and vimpat 50 mg twice daily will monitor   2. Left side spastic  Hemiparesis status post cva: is without change will continue asa 325 mg daily; will increase her neurontin to 300 mg three times daily for pain management and will monitor   3. Psychosis: no reports of behavioral issues present;  on abilify 2 mg daily; is on lamictal 150 mg daily to stabilize mood;  is followed by psych services   4. Depression with anxiety: she does continue to receive benefit from prozac 60 mg daily  will not make changes will monitor her status.   5. Dyslipidemia: her ldl is 122; will continue lipitor 10 mg daily   6. Astrocytoma brain tumor: is status post radiation therapy; is without change in status;is followed by neurology  will monitor  7. CKD stage III: no change in status; creat is 1.48; will monitor      Ok Edwards NP [facility]  Contact [phone] Monday through Friday 8am- 5pm  After hours call [phone]

## 2014-11-02 ENCOUNTER — Non-Acute Institutional Stay (SKILLED_NURSING_FACILITY): Payer: Medicare Other | Admitting: Adult Health

## 2014-11-02 DIAGNOSIS — K2100 Gastro-esophageal reflux disease with esophagitis, without bleeding: Secondary | ICD-10-CM

## 2014-11-02 DIAGNOSIS — R569 Unspecified convulsions: Secondary | ICD-10-CM

## 2014-11-02 DIAGNOSIS — F418 Other specified anxiety disorders: Secondary | ICD-10-CM

## 2014-11-02 DIAGNOSIS — G8110 Spastic hemiplegia affecting unspecified side: Secondary | ICD-10-CM | POA: Diagnosis not present

## 2014-11-02 DIAGNOSIS — E785 Hyperlipidemia, unspecified: Secondary | ICD-10-CM

## 2014-11-02 DIAGNOSIS — C719 Malignant neoplasm of brain, unspecified: Secondary | ICD-10-CM | POA: Diagnosis not present

## 2014-11-02 DIAGNOSIS — G8114 Spastic hemiplegia affecting left nondominant side: Secondary | ICD-10-CM

## 2014-11-05 ENCOUNTER — Inpatient Hospital Stay (HOSPITAL_COMMUNITY)
Admission: EM | Admit: 2014-11-05 | Discharge: 2014-11-16 | DRG: 689 | Disposition: A | Discharge status: Skilled Nursing Facility | Payer: Medicare Other | Attending: Internal Medicine | Admitting: Internal Medicine

## 2014-11-05 ENCOUNTER — Emergency Department (HOSPITAL_COMMUNITY): Payer: Medicare Other

## 2014-11-05 ENCOUNTER — Encounter (HOSPITAL_COMMUNITY): Payer: Self-pay | Admitting: Emergency Medicine

## 2014-11-05 DIAGNOSIS — N12 Tubulo-interstitial nephritis, not specified as acute or chronic: Principal | ICD-10-CM | POA: Diagnosis present

## 2014-11-05 DIAGNOSIS — C7931 Secondary malignant neoplasm of brain: Secondary | ICD-10-CM | POA: Diagnosis present

## 2014-11-05 DIAGNOSIS — I609 Nontraumatic subarachnoid hemorrhage, unspecified: Secondary | ICD-10-CM

## 2014-11-05 DIAGNOSIS — Z7401 Bed confinement status: Secondary | ICD-10-CM

## 2014-11-05 DIAGNOSIS — N390 Urinary tract infection, site not specified: Secondary | ICD-10-CM | POA: Diagnosis present

## 2014-11-05 DIAGNOSIS — I639 Cerebral infarction, unspecified: Secondary | ICD-10-CM | POA: Diagnosis present

## 2014-11-05 DIAGNOSIS — Z7189 Other specified counseling: Secondary | ICD-10-CM | POA: Insufficient documentation

## 2014-11-05 DIAGNOSIS — G40802 Other epilepsy, not intractable, without status epilepticus: Secondary | ICD-10-CM | POA: Diagnosis present

## 2014-11-05 DIAGNOSIS — Z7982 Long term (current) use of aspirin: Secondary | ICD-10-CM

## 2014-11-05 DIAGNOSIS — D61818 Other pancytopenia: Secondary | ICD-10-CM | POA: Diagnosis present

## 2014-11-05 DIAGNOSIS — G8114 Spastic hemiplegia affecting left nondominant side: Secondary | ICD-10-CM | POA: Diagnosis present

## 2014-11-05 DIAGNOSIS — Z888 Allergy status to other drugs, medicaments and biological substances status: Secondary | ICD-10-CM

## 2014-11-05 DIAGNOSIS — G92 Toxic encephalopathy: Secondary | ICD-10-CM | POA: Diagnosis present

## 2014-11-05 DIAGNOSIS — F32A Depression, unspecified: Secondary | ICD-10-CM | POA: Diagnosis present

## 2014-11-05 DIAGNOSIS — G9389 Other specified disorders of brain: Secondary | ICD-10-CM | POA: Diagnosis present

## 2014-11-05 DIAGNOSIS — K219 Gastro-esophageal reflux disease without esophagitis: Secondary | ICD-10-CM | POA: Diagnosis present

## 2014-11-05 DIAGNOSIS — G9340 Encephalopathy, unspecified: Secondary | ICD-10-CM | POA: Diagnosis present

## 2014-11-05 DIAGNOSIS — Z86718 Personal history of other venous thrombosis and embolism: Secondary | ICD-10-CM

## 2014-11-05 DIAGNOSIS — N189 Chronic kidney disease, unspecified: Secondary | ICD-10-CM

## 2014-11-05 DIAGNOSIS — B9620 Unspecified Escherichia coli [E. coli] as the cause of diseases classified elsewhere: Secondary | ICD-10-CM | POA: Diagnosis present

## 2014-11-05 DIAGNOSIS — R140 Abdominal distension (gaseous): Secondary | ICD-10-CM

## 2014-11-05 DIAGNOSIS — F419 Anxiety disorder, unspecified: Secondary | ICD-10-CM | POA: Diagnosis present

## 2014-11-05 DIAGNOSIS — M62838 Other muscle spasm: Secondary | ICD-10-CM | POA: Diagnosis present

## 2014-11-05 DIAGNOSIS — K2100 Gastro-esophageal reflux disease with esophagitis, without bleeding: Secondary | ICD-10-CM

## 2014-11-05 DIAGNOSIS — Z79899 Other long term (current) drug therapy: Secondary | ICD-10-CM

## 2014-11-05 DIAGNOSIS — C719 Malignant neoplasm of brain, unspecified: Secondary | ICD-10-CM | POA: Diagnosis present

## 2014-11-05 DIAGNOSIS — Z515 Encounter for palliative care: Secondary | ICD-10-CM | POA: Insufficient documentation

## 2014-11-05 DIAGNOSIS — F418 Other specified anxiety disorders: Secondary | ICD-10-CM | POA: Diagnosis present

## 2014-11-05 DIAGNOSIS — E860 Dehydration: Secondary | ICD-10-CM | POA: Diagnosis present

## 2014-11-05 DIAGNOSIS — E785 Hyperlipidemia, unspecified: Secondary | ICD-10-CM | POA: Diagnosis present

## 2014-11-05 DIAGNOSIS — Z923 Personal history of irradiation: Secondary | ICD-10-CM

## 2014-11-05 DIAGNOSIS — N183 Chronic kidney disease, stage 3 unspecified: Secondary | ICD-10-CM | POA: Diagnosis present

## 2014-11-05 DIAGNOSIS — G939 Disorder of brain, unspecified: Secondary | ICD-10-CM | POA: Diagnosis present

## 2014-11-05 DIAGNOSIS — Z85841 Personal history of malignant neoplasm of brain: Secondary | ICD-10-CM

## 2014-11-05 DIAGNOSIS — I671 Cerebral aneurysm, nonruptured: Secondary | ICD-10-CM | POA: Diagnosis present

## 2014-11-05 DIAGNOSIS — R51 Headache: Secondary | ICD-10-CM | POA: Diagnosis present

## 2014-11-05 DIAGNOSIS — R4182 Altered mental status, unspecified: Secondary | ICD-10-CM | POA: Diagnosis present

## 2014-11-05 DIAGNOSIS — R1310 Dysphagia, unspecified: Secondary | ICD-10-CM

## 2014-11-05 DIAGNOSIS — R569 Unspecified convulsions: Secondary | ICD-10-CM

## 2014-11-05 DIAGNOSIS — F329 Major depressive disorder, single episode, unspecified: Secondary | ICD-10-CM | POA: Diagnosis present

## 2014-11-05 DIAGNOSIS — N289 Disorder of kidney and ureter, unspecified: Secondary | ICD-10-CM

## 2014-11-05 DIAGNOSIS — R600 Localized edema: Secondary | ICD-10-CM | POA: Diagnosis present

## 2014-11-05 DIAGNOSIS — Z8673 Personal history of transient ischemic attack (TIA), and cerebral infarction without residual deficits: Secondary | ICD-10-CM

## 2014-11-05 DIAGNOSIS — K567 Ileus, unspecified: Secondary | ICD-10-CM

## 2014-11-05 DIAGNOSIS — N179 Acute kidney failure, unspecified: Secondary | ICD-10-CM | POA: Diagnosis present

## 2014-11-05 HISTORY — DX: Gastro-esophageal reflux disease without esophagitis: K21.9

## 2014-11-05 LAB — I-STAT CHEM 8, ED
BUN: 29 mg/dL — AB (ref 6–20)
CREATININE: 1.8 mg/dL — AB (ref 0.44–1.00)
Calcium, Ion: 1.29 mmol/L — ABNORMAL HIGH (ref 1.12–1.23)
Chloride: 112 mmol/L — ABNORMAL HIGH (ref 101–111)
GLUCOSE: 101 mg/dL — AB (ref 65–99)
HCT: 30 % — ABNORMAL LOW (ref 36.0–46.0)
Hemoglobin: 10.2 g/dL — ABNORMAL LOW (ref 12.0–15.0)
Potassium: 3.5 mmol/L (ref 3.5–5.1)
Sodium: 144 mmol/L (ref 135–145)
TCO2: 16 mmol/L (ref 0–100)

## 2014-11-05 LAB — COMPREHENSIVE METABOLIC PANEL
ALT: 24 U/L (ref 14–54)
AST: 32 U/L (ref 15–41)
Albumin: 3.4 g/dL — ABNORMAL LOW (ref 3.5–5.0)
Alkaline Phosphatase: 80 U/L (ref 38–126)
Anion gap: 10 (ref 5–15)
BILIRUBIN TOTAL: 0.8 mg/dL (ref 0.3–1.2)
BUN: 28 mg/dL — ABNORMAL HIGH (ref 6–20)
CO2: 19 mmol/L — ABNORMAL LOW (ref 22–32)
Calcium: 10.1 mg/dL (ref 8.9–10.3)
Chloride: 112 mmol/L — ABNORMAL HIGH (ref 101–111)
Creatinine, Ser: 1.73 mg/dL — ABNORMAL HIGH (ref 0.44–1.00)
GFR calc Af Amer: 37 mL/min — ABNORMAL LOW (ref >60)
GFR calc non Af Amer: 32 mL/min — ABNORMAL LOW (ref >60)
Glucose, Bld: 104 mg/dL — ABNORMAL HIGH (ref 65–99)
Potassium: 3.5 mmol/L (ref 3.5–5.1)
Sodium: 141 mmol/L (ref 135–145)
Total Protein: 5.9 g/dL — ABNORMAL LOW (ref 6.5–8.1)

## 2014-11-05 LAB — DIFFERENTIAL
BASOS PCT: 0 % (ref 0–1)
Basophils Absolute: 0 10*3/uL (ref 0.0–0.1)
Eosinophils Absolute: 0.1 10*3/uL (ref 0.0–0.7)
Eosinophils Relative: 2 % (ref 0–5)
LYMPHS PCT: 28 % (ref 12–46)
Lymphs Abs: 1.2 10*3/uL (ref 0.7–4.0)
Monocytes Absolute: 0.3 10*3/uL (ref 0.1–1.0)
Monocytes Relative: 6 % (ref 3–12)
NEUTROS ABS: 2.8 10*3/uL (ref 1.7–7.7)
Neutrophils Relative %: 64 % (ref 43–77)

## 2014-11-05 LAB — CBC
HEMATOCRIT: 32.7 % — AB (ref 36.0–46.0)
Hemoglobin: 10.5 g/dL — ABNORMAL LOW (ref 12.0–15.0)
MCH: 25.9 pg — ABNORMAL LOW (ref 26.0–34.0)
MCHC: 32.1 g/dL (ref 30.0–36.0)
MCV: 80.5 fL (ref 78.0–100.0)
PLATELETS: 108 10*3/uL — AB (ref 150–400)
RBC: 4.06 MIL/uL (ref 3.87–5.11)
RDW: 14.9 % (ref 11.5–15.5)
WBC: 4.3 10*3/uL (ref 4.0–10.5)

## 2014-11-05 LAB — ETHANOL: Alcohol, Ethyl (B): <5 mg/dL (ref <5)

## 2014-11-05 LAB — PROTIME-INR
INR: 1.2 (ref 0.00–1.49)
Prothrombin Time: 15.3 seconds — ABNORMAL HIGH (ref 11.6–15.2)

## 2014-11-05 LAB — I-STAT TROPONIN, ED: Troponin i, poc: 0.01 ng/mL (ref 0.00–0.08)

## 2014-11-05 LAB — APTT: aPTT: 29 seconds (ref 24–37)

## 2014-11-05 NOTE — ED Provider Notes (Signed)
CSN: [id number]     Arrival date & time 11/05/14  2149 History   First MD Initiated Contact with Patient 11/05/14 2155     Chief Complaint  Patient presents with  . Altered Mental Status     (Consider location/radiation/quality/duration/timing/severity/associated sxs/prior Treatment) The history is provided by the patient.  [name] is a 54 y.o. female hx of R parietal brain tumor, seizures, depression, previous stroke with L sided contractures, who presenting with altered mental status. She is from [facility] and per the nursing home she has not been acting herself today. No definite time of onset. She is usually able to answer yes or no but not speaking today. EMS noticed some right-sided jerking movements that resolved upon arrival to the ED. Patient is currently on [place].   Level V caveat- AMS    Past Medical History  Diagnosis Date  . 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 Fracture, ankle 2012    right  . History of radiation therapy 09/23/10- 11/07/10    right parietal lobe  . Chronic headaches     uses oxycodone appropriately  . Anxiety   . Muscle spasticity   . Seizures   . Dysphagia    Past Surgical History  Procedure Laterality Date  . Orif ankle fracture  08/22/2010    right  . Radiology with anesthesia N/A 05/02/2013    Procedure: RADIOLOGY WITH ANESTHESIA;  Surgeon: Consuella Lose, MD;  Location: [place];  Service: Radiology;  Laterality: N/A;  . Brain surgery Right 2012    astrocytoma R parietal lobe   Family History  Problem Relation Age of Onset  . Breast cancer Mother    History  Substance Use Topics  . Smoking status: Never Smoker   . Smokeless tobacco: Never Used  . Alcohol Use: 0.0 oz/week    3-4 Cans of beer per week   OB History    No data available     Review of Systems  Unable to perform ROS: Mental status change      Allergies  Vasotec  Home Medications   Prior to Admission medications   Medication  Sig Start Date End Date Taking? Authorizing Provider  acetaminophen (TYLENOL) 325 MG tablet Take 650 mg by mouth every 4 (four) hours as needed. For pain    Historical Provider, MD  ARIPiprazole (ABILIFY) 5 MG tablet Take 7 mg by mouth daily. For psychosis    Historical Provider, MD  aspirin 325 MG EC tablet Take 325 mg by mouth daily. For cerebral aneurysm    Historical Provider, MD  atorvastatin (LIPITOR) 10 MG tablet Take 10 mg by mouth daily. For lipoproteins    Historical Provider, MD  FLUoxetine (PROZAC) 20 MG capsule Take 60 mg by mouth daily. 07/12/13   Sheila Oats, MD  gabapentin (NEURONTIN) 100 MG capsule Take 300 mg by mouth 2 (two) times daily. Every morning and at bedtime for left side pain    Historical Provider, MD  gabapentin (NEURONTIN) 300 MG capsule Take 300 mg by mouth 3 (three) times daily.    Historical Provider, MD  hydrocortisone (ANUSOL-HC) 2.5 % rectal cream Place 1 application rectally 2 (two) times daily.    Historical Provider, MD  lacosamide (VIMPAT) 50 MG TABS tablet Take one tablet by mouth twice daily 07/17/14   Blanchie Serve, MD  lamoTRIgine (LAMICTAL) 100 MG tablet Take 100 mg by mouth daily. For mood disorder    Historical Provider, MD  levETIRAcetam (KEPPRA) 500 MG tablet Take 1 tablet (500 mg total) by mouth every 12 (twelve) hours. 01/25/13   Chauncey Cruel, MD  magnesium hydroxide (MILK OF MAGNESIA) 400 MG/5ML suspension Take 30 mLs by mouth daily as needed for mild constipation.    Historical Provider, MD  Multiple Vitamin (MULTIVITAMIN) tablet Take 1 tablet by mouth daily.      Historical Provider, MD  pantoprazole (PROTONIX) 40 MG tablet Take 40 mg by mouth daily.    Historical Provider, MD   BP 134/80 mmHg  Pulse 76  Temp(Src) 99.7 F (37.6 C) (Oral)  Resp 12  SpO2 94%  LMP 08/26/2010 Physical Exam  Constitutional:  Chronically ill   HENT:  Head: Normocephalic.  Mouth/Throat: Oropharynx is clear and moist.  Eyes: Conjunctivae and EOM are  normal. Pupils are equal, round, and reactive to light.  No obvious eye deviation   Neck: Normal range of motion. Neck supple.  Cardiovascular: Normal rate and regular rhythm.   2/6 systolic murmur   Pulmonary/Chest: Effort normal and breath sounds normal. No respiratory distress. She has no wheezes. She has no rales.  Abdominal: Soft. Bowel sounds are normal. She exhibits no distension. There is no tenderness. There is no rebound.  Musculoskeletal: Normal range of motion.  Neurological:  Lethargic. Contracted on L side. No obvious seizure activities. Moving R side   Skin: Skin is warm and dry.  Psychiatric:  Unable   Nursing note and vitals reviewed.   ED Course  Procedures (including critical care time) Labs Review Labs Reviewed  PROTIME-INR - Abnormal; Notable for the following:    Prothrombin Time 15.3 (*)    All other components within normal limits  CBC - Abnormal; Notable for the following:    Hemoglobin 10.5 (*)    HCT 32.7 (*)    MCH 25.9 (*)    Platelets 108 (*)    All other components within normal limits  COMPREHENSIVE METABOLIC PANEL - Abnormal; Notable for the following:    Chloride 112 (*)    CO2 19 (*)    Glucose, Bld 104 (*)    BUN 28 (*)    Creatinine, Ser 1.73 (*)    Total Protein 5.9 (*)    Albumin 3.4 (*)    GFR calc non Af Amer 32 (*)    GFR calc Af Amer 37 (*)    All other components within normal limits  I-STAT CHEM 8, ED - Abnormal; Notable for the following:    Chloride 112 (*)    BUN 29 (*)    Creatinine, Ser 1.80 (*)    Glucose, Bld 101 (*)    Calcium, Ion 1.29 (*)    Hemoglobin 10.2 (*)    HCT 30.0 (*)    All other components within normal limits  ETHANOL  APTT  DIFFERENTIAL  URINE RAPID DRUG SCREEN (HOSP PERFORMED) NOT AT [facility]  URINALYSIS, ROUTINE W REFLEX MICROSCOPIC (NOT AT [facility])  I-STAT TROPOININ, ED    Imaging Review Ct Head Wo Contrast  11/05/2014   CLINICAL DATA:  Altered mental status.  EXAM: CT HEAD WITHOUT CONTRAST   TECHNIQUE: Contiguous axial images were obtained from the base of the skull through the vertex without intravenous contrast.  COMPARISON:  Multiple priors.  Most recent MR 07/28/2014.  FINDINGS: Surgical encephalomalacia RIGHT parietal lobe. Post treatment changes with hypoattenuation of the cortex and white matter throughout the RIGHT hemisphere. Slight thickening of the posterior aspect to the resection cavity, may be slightly involuted compared  with priors.  Slight hyperattenuation of a 5 mm area of medial LEFT occipital lobe gyrus as seen on 18 and 19, not clearly present on priors. There may be slight associated occipital vasogenic surrounding edema. Recurrent disease not excluded on this noncontrast scan.  Calvarium intact. No acute findings in the orbits, sinuses, or mastoids.  IMPRESSION: Stable to slightly improved changes throughout the RIGHT hemisphere, reflecting previous surgery and radiation.  Possible new finding medial LEFT occipital lobe, slight hyperattenuation and possible associated vasogenic edema. Recurrent disease not excluded. Further evaluation recommended with MRI brain without and with contrast when the patient is stable.   Electronically Signed   By: Rolla Flatten M.D.   On: 11/05/2014 23:12   Dg Chest Port 1 View  11/06/2014   CLINICAL DATA:  Altered mental status.  EXAM: PORTABLE CHEST - 1 VIEW  COMPARISON:  07/05/2013  FINDINGS: The heart is at the upper limits of normal in size. Pulmonary vasculature is normal. No consolidation, pleural effusion, or pneumothorax. No acute osseous abnormalities are seen. Unchanged scoliotic curvature of the spine.  IMPRESSION: No acute pulmonary process.   Electronically Signed   By: Jeb Levering M.D.   On: 11/06/2014 00:04     EKG Interpretation   Date/Time:  Sunday November 05 2014 21:50:18 EDT Ventricular Rate:  78 PR Interval:  147 QRS Duration: 151 QT Interval:  523 QTC Calculation: 596 R Axis:   -49 Text Interpretation:  Sinus  rhythm Probable left atrial enlargement RBBB  and LAFB No significant change since last tracing Confirmed by YAO  MD,  DAVID ([id number]) on 11/05/2014 10:04:14 PM      MDM   Final diagnoses:  Altered mental state    YARIELYS BEED is a 54 y.o. female here with AMS. Consider bleed vs stroke vs sepsis vs electrolyte abnormalities. Unknown onset so will not activate code stroke. Will do stroke workup, get labs, CXR, UA .   12:15 AM CT showed new L occipital lobe attenuation with edema. Labs at baseline. Consulted Dr. Leonel Ramsay from neurology, who recommend stroke workup and loading with keppra 1 g IV. Will admit to stepdown.   Wandra Arthurs, MD 11/06/14 [phone]

## 2014-11-05 NOTE — ED Notes (Signed)
From [facility].  Per EMS staff stated patient not herself today.  Only answering yes or no and is usually verbal.  GL also reported right pupil being dilated and left being normal.  Per EMS normal upon their arrival.  Hx of dysphagia, mood disorder, psychosis, depression and periods of not speaking.  Answering yes or no questions in triage.  Does not know why she is here.

## 2014-11-06 ENCOUNTER — Encounter (HOSPITAL_COMMUNITY): Payer: Self-pay | Admitting: Internal Medicine

## 2014-11-06 ENCOUNTER — Inpatient Hospital Stay (HOSPITAL_COMMUNITY): Payer: Medicare Other

## 2014-11-06 DIAGNOSIS — Z7189 Other specified counseling: Secondary | ICD-10-CM | POA: Diagnosis not present

## 2014-11-06 DIAGNOSIS — R1310 Dysphagia, unspecified: Secondary | ICD-10-CM

## 2014-11-06 DIAGNOSIS — G92 Toxic encephalopathy: Secondary | ICD-10-CM | POA: Diagnosis present

## 2014-11-06 DIAGNOSIS — F329 Major depressive disorder, single episode, unspecified: Secondary | ICD-10-CM | POA: Diagnosis present

## 2014-11-06 DIAGNOSIS — N390 Urinary tract infection, site not specified: Secondary | ICD-10-CM | POA: Diagnosis present

## 2014-11-06 DIAGNOSIS — F419 Anxiety disorder, unspecified: Secondary | ICD-10-CM | POA: Diagnosis present

## 2014-11-06 DIAGNOSIS — Z7401 Bed confinement status: Secondary | ICD-10-CM | POA: Diagnosis not present

## 2014-11-06 DIAGNOSIS — Z85841 Personal history of malignant neoplasm of brain: Secondary | ICD-10-CM | POA: Diagnosis not present

## 2014-11-06 DIAGNOSIS — G8110 Spastic hemiplegia affecting unspecified side: Secondary | ICD-10-CM

## 2014-11-06 DIAGNOSIS — G40802 Other epilepsy, not intractable, without status epilepticus: Secondary | ICD-10-CM | POA: Diagnosis present

## 2014-11-06 DIAGNOSIS — I639 Cerebral infarction, unspecified: Secondary | ICD-10-CM

## 2014-11-06 DIAGNOSIS — Z515 Encounter for palliative care: Secondary | ICD-10-CM | POA: Diagnosis not present

## 2014-11-06 DIAGNOSIS — Z86718 Personal history of other venous thrombosis and embolism: Secondary | ICD-10-CM | POA: Diagnosis not present

## 2014-11-06 DIAGNOSIS — R4182 Altered mental status, unspecified: Secondary | ICD-10-CM | POA: Diagnosis not present

## 2014-11-06 DIAGNOSIS — B9620 Unspecified Escherichia coli [E. coli] as the cause of diseases classified elsewhere: Secondary | ICD-10-CM | POA: Diagnosis present

## 2014-11-06 DIAGNOSIS — R51 Headache: Secondary | ICD-10-CM | POA: Diagnosis present

## 2014-11-06 DIAGNOSIS — E785 Hyperlipidemia, unspecified: Secondary | ICD-10-CM

## 2014-11-06 DIAGNOSIS — C719 Malignant neoplasm of brain, unspecified: Secondary | ICD-10-CM

## 2014-11-06 DIAGNOSIS — N12 Tubulo-interstitial nephritis, not specified as acute or chronic: Secondary | ICD-10-CM | POA: Diagnosis present

## 2014-11-06 DIAGNOSIS — G9389 Other specified disorders of brain: Secondary | ICD-10-CM | POA: Diagnosis present

## 2014-11-06 DIAGNOSIS — D61818 Other pancytopenia: Secondary | ICD-10-CM | POA: Diagnosis present

## 2014-11-06 DIAGNOSIS — Z8673 Personal history of transient ischemic attack (TIA), and cerebral infarction without residual deficits: Secondary | ICD-10-CM | POA: Diagnosis not present

## 2014-11-06 DIAGNOSIS — K567 Ileus, unspecified: Secondary | ICD-10-CM | POA: Diagnosis not present

## 2014-11-06 DIAGNOSIS — Z79899 Other long term (current) drug therapy: Secondary | ICD-10-CM | POA: Diagnosis not present

## 2014-11-06 DIAGNOSIS — K219 Gastro-esophageal reflux disease without esophagitis: Secondary | ICD-10-CM | POA: Diagnosis present

## 2014-11-06 DIAGNOSIS — E860 Dehydration: Secondary | ICD-10-CM | POA: Diagnosis present

## 2014-11-06 DIAGNOSIS — G939 Disorder of brain, unspecified: Secondary | ICD-10-CM | POA: Diagnosis not present

## 2014-11-06 DIAGNOSIS — F418 Other specified anxiety disorders: Secondary | ICD-10-CM

## 2014-11-06 DIAGNOSIS — R600 Localized edema: Secondary | ICD-10-CM | POA: Diagnosis present

## 2014-11-06 DIAGNOSIS — G9340 Encephalopathy, unspecified: Secondary | ICD-10-CM | POA: Diagnosis present

## 2014-11-06 DIAGNOSIS — N179 Acute kidney failure, unspecified: Secondary | ICD-10-CM

## 2014-11-06 DIAGNOSIS — Z923 Personal history of irradiation: Secondary | ICD-10-CM | POA: Diagnosis not present

## 2014-11-06 DIAGNOSIS — K210 Gastro-esophageal reflux disease with esophagitis: Secondary | ICD-10-CM

## 2014-11-06 DIAGNOSIS — R569 Unspecified convulsions: Secondary | ICD-10-CM | POA: Diagnosis not present

## 2014-11-06 DIAGNOSIS — Z888 Allergy status to other drugs, medicaments and biological substances status: Secondary | ICD-10-CM | POA: Diagnosis not present

## 2014-11-06 DIAGNOSIS — G8114 Spastic hemiplegia affecting left nondominant side: Secondary | ICD-10-CM | POA: Diagnosis present

## 2014-11-06 DIAGNOSIS — N183 Chronic kidney disease, stage 3 (moderate): Secondary | ICD-10-CM | POA: Diagnosis present

## 2014-11-06 DIAGNOSIS — R400 Somnolence: Secondary | ICD-10-CM | POA: Diagnosis not present

## 2014-11-06 DIAGNOSIS — Z7982 Long term (current) use of aspirin: Secondary | ICD-10-CM | POA: Diagnosis not present

## 2014-11-06 DIAGNOSIS — M62838 Other muscle spasm: Secondary | ICD-10-CM | POA: Diagnosis present

## 2014-11-06 LAB — GLUCOSE, CAPILLARY: Glucose-Capillary: 75 mg/dL (ref 65–99)

## 2014-11-06 LAB — CBC
HEMATOCRIT: 29.3 % — AB (ref 36.0–46.0)
Hemoglobin: 9.3 g/dL — ABNORMAL LOW (ref 12.0–15.0)
MCH: 26.1 pg (ref 26.0–34.0)
MCHC: 31.7 g/dL (ref 30.0–36.0)
MCV: 82.3 fL (ref 78.0–100.0)
PLATELETS: 98 10*3/uL — AB (ref 150–400)
RBC: 3.56 MIL/uL — AB (ref 3.87–5.11)
RDW: 15 % (ref 11.5–15.5)
WBC: 3.3 10*3/uL — ABNORMAL LOW (ref 4.0–10.5)

## 2014-11-06 LAB — URINALYSIS, ROUTINE W REFLEX MICROSCOPIC
BILIRUBIN URINE: NEGATIVE
GLUCOSE, UA: NEGATIVE mg/dL
Hgb urine dipstick: NEGATIVE
Ketones, ur: NEGATIVE mg/dL
NITRITE: POSITIVE — AB
Protein, ur: NEGATIVE mg/dL
Specific Gravity, Urine: 1.018 (ref 1.005–1.030)
UROBILINOGEN UA: 1 mg/dL (ref 0.0–1.0)
pH: 5.5 (ref 5.0–8.0)

## 2014-11-06 LAB — URINE MICROSCOPIC-ADD ON

## 2014-11-06 LAB — COMPREHENSIVE METABOLIC PANEL
ALT: 25 U/L (ref 14–54)
ANION GAP: 7 (ref 5–15)
AST: 34 U/L (ref 15–41)
Albumin: 3.1 g/dL — ABNORMAL LOW (ref 3.5–5.0)
Alkaline Phosphatase: 65 U/L (ref 38–126)
BILIRUBIN TOTAL: 0.7 mg/dL (ref 0.3–1.2)
BUN: 26 mg/dL — ABNORMAL HIGH (ref 6–20)
CALCIUM: 9.7 mg/dL (ref 8.9–10.3)
CO2: 20 mmol/L — ABNORMAL LOW (ref 22–32)
Chloride: 117 mmol/L — ABNORMAL HIGH (ref 101–111)
Creatinine, Ser: 1.51 mg/dL — ABNORMAL HIGH (ref 0.44–1.00)
GFR calc non Af Amer: 38 mL/min — ABNORMAL LOW (ref >60)
GFR, EST AFRICAN AMERICAN: 44 mL/min — AB (ref >60)
GLUCOSE: 96 mg/dL (ref 65–99)
Potassium: 3.5 mmol/L (ref 3.5–5.1)
Sodium: 144 mmol/L (ref 135–145)
TOTAL PROTEIN: 5.4 g/dL — AB (ref 6.5–8.1)

## 2014-11-06 LAB — RAPID URINE DRUG SCREEN, HOSP PERFORMED
Amphetamines: NOT DETECTED
BENZODIAZEPINES: NOT DETECTED
Barbiturates: NOT DETECTED
Cocaine: NOT DETECTED
Opiates: NOT DETECTED
Tetrahydrocannabinol: NOT DETECTED

## 2014-11-06 LAB — CREATININE, URINE, RANDOM: CREATININE, URINE: 136.99 mg/dL

## 2014-11-06 LAB — MRSA PCR SCREENING: MRSA by PCR: NEGATIVE

## 2014-11-06 LAB — LACTIC ACID, PLASMA
LACTIC ACID, VENOUS: 0.9 mmol/L (ref 0.5–2.0)
Lactic Acid, Venous: 1.3 mmol/L (ref 0.5–2.0)

## 2014-11-06 LAB — PROCALCITONIN: Procalcitonin: <0.1 ng/mL

## 2014-11-06 LAB — CBG MONITORING, ED: Glucose-Capillary: 79 mg/dL (ref 65–99)

## 2014-11-06 MED ORDER — LAMOTRIGINE 100 MG PO TABS
100.0000 mg | ORAL_TABLET | Freq: Every day | ORAL | Status: DC
  Administered 2014-11-06 – 2014-11-16 (×10): 100 mg via ORAL
  Filled 2014-11-06 (×11): qty 1

## 2014-11-06 MED ORDER — GABAPENTIN 300 MG PO CAPS
300.0000 mg | ORAL_CAPSULE | Freq: Two times a day (BID) | ORAL | Status: DC
  Filled 2014-11-06 (×2): qty 1

## 2014-11-06 MED ORDER — ONE-DAILY MULTI VITAMINS PO TABS
1.0000 | ORAL_TABLET | Freq: Every day | ORAL | Status: DC
  Administered 2014-11-06: 1 via ORAL
  Filled 2014-11-06: qty 1

## 2014-11-06 MED ORDER — MORPHINE SULFATE 2 MG/ML IJ SOLN
1.0000 mg | INTRAMUSCULAR | Status: DC | PRN
  Administered 2014-11-09 – 2014-11-11 (×3): 1 mg via INTRAVENOUS
  Filled 2014-11-06 (×3): qty 1

## 2014-11-06 MED ORDER — FLUOXETINE HCL 20 MG PO CAPS
60.0000 mg | ORAL_CAPSULE | Freq: Every day | ORAL | Status: DC
  Administered 2014-11-06: 60 mg via ORAL
  Filled 2014-11-06: qty 3

## 2014-11-06 MED ORDER — SODIUM CHLORIDE 0.9 % IV BOLUS (SEPSIS)
1000.0000 mL | Freq: Once | INTRAVENOUS | Status: AC
  Administered 2014-11-06: 1000 mL via INTRAVENOUS

## 2014-11-06 MED ORDER — ATORVASTATIN CALCIUM 10 MG PO TABS
10.0000 mg | ORAL_TABLET | Freq: Every day | ORAL | Status: DC
  Administered 2014-11-06: 10 mg via ORAL
  Filled 2014-11-06: qty 1

## 2014-11-06 MED ORDER — SODIUM CHLORIDE 0.9 % IV SOLN
INTRAVENOUS | Status: DC
  Administered 2014-11-06: 18:00:00 via INTRAVENOUS
  Administered 2014-11-07: 100 mL/h via INTRAVENOUS
  Administered 2014-11-08 – 2014-11-10 (×2): via INTRAVENOUS

## 2014-11-06 MED ORDER — LEVETIRACETAM 500 MG/5ML IV SOLN
500.0000 mg | Freq: Two times a day (BID) | INTRAVENOUS | Status: DC
  Administered 2014-11-06 – 2014-11-12 (×12): 500 mg via INTRAVENOUS
  Filled 2014-11-06 (×14): qty 5

## 2014-11-06 MED ORDER — ARIPIPRAZOLE 5 MG PO TABS
7.0000 mg | ORAL_TABLET | Freq: Every day | ORAL | Status: DC
  Administered 2014-11-06 – 2014-11-16 (×10): 7 mg via ORAL
  Filled 2014-11-06 (×12): qty 1

## 2014-11-06 MED ORDER — LEVETIRACETAM 500 MG PO TABS
500.0000 mg | ORAL_TABLET | Freq: Two times a day (BID) | ORAL | Status: DC
  Administered 2014-11-06: 500 mg via ORAL
  Filled 2014-11-06 (×2): qty 1

## 2014-11-06 MED ORDER — ACETAMINOPHEN 325 MG PO TABS: 650.0000 mg | ORAL_TABLET | ORAL | Status: DC | PRN

## 2014-11-06 MED ORDER — PANTOPRAZOLE SODIUM 40 MG PO TBEC
40.0000 mg | DELAYED_RELEASE_TABLET | Freq: Every day | ORAL | Status: DC
  Administered 2014-11-06: 40 mg via ORAL
  Filled 2014-11-06: qty 1

## 2014-11-06 MED ORDER — ASPIRIN EC 325 MG PO TBEC
325.0000 mg | DELAYED_RELEASE_TABLET | Freq: Every day | ORAL | Status: DC
  Administered 2014-11-06: 325 mg via ORAL
  Filled 2014-11-06: qty 1

## 2014-11-06 MED ORDER — DEXAMETHASONE SODIUM PHOSPHATE 10 MG/ML IJ SOLN
10.0000 mg | INTRAMUSCULAR | Status: AC
  Administered 2014-11-06: 10 mg via INTRAVENOUS
  Filled 2014-11-06: qty 1

## 2014-11-06 MED ORDER — LACOSAMIDE 50 MG PO TABS
50.0000 mg | ORAL_TABLET | Freq: Two times a day (BID) | ORAL | Status: DC
  Administered 2014-11-06 – 2014-11-16 (×20): 50 mg via ORAL
  Filled 2014-11-06 (×21): qty 1

## 2014-11-06 MED ORDER — CHLORHEXIDINE GLUCONATE 0.12 % MT SOLN
15.0000 mL | Freq: Two times a day (BID) | OROMUCOSAL | Status: DC
  Administered 2014-11-06 – 2014-11-16 (×20): 15 mL via OROMUCOSAL
  Filled 2014-11-06 (×19): qty 15

## 2014-11-06 MED ORDER — ACETAMINOPHEN 325 MG PO TABS: 650.0000 mg | ORAL_TABLET | Freq: Four times a day (QID) | ORAL | Status: DC | PRN

## 2014-11-06 MED ORDER — CETYLPYRIDINIUM CHLORIDE 0.05 % MT LIQD
7.0000 mL | Freq: Two times a day (BID) | OROMUCOSAL | Status: DC
  Administered 2014-11-06 – 2014-11-16 (×20): 7 mL via OROMUCOSAL

## 2014-11-06 MED ORDER — SODIUM CHLORIDE 0.9 % IV SOLN
INTRAVENOUS | Status: DC
  Administered 2014-11-06: 07:00:00 via INTRAVENOUS

## 2014-11-06 MED ORDER — HEPARIN SODIUM (PORCINE) 5000 UNIT/ML IJ SOLN
5000.0000 [IU] | Freq: Three times a day (TID) | INTRAMUSCULAR | Status: DC
  Administered 2014-11-06 – 2014-11-07 (×4): 5000 [IU] via SUBCUTANEOUS
  Filled 2014-11-06 (×7): qty 1

## 2014-11-06 MED ORDER — MAGNESIUM HYDROXIDE 400 MG/5ML PO SUSP: 30.0000 mL | Freq: Every day | ORAL | Status: DC | PRN

## 2014-11-06 MED ORDER — PANTOPRAZOLE SODIUM 40 MG IV SOLR
40.0000 mg | INTRAVENOUS | Status: DC
  Administered 2014-11-06 – 2014-11-11 (×6): 40 mg via INTRAVENOUS
  Filled 2014-11-06 (×4): qty 40

## 2014-11-06 MED ORDER — DEXTROSE 5 % IV SOLN
1.0000 g | INTRAVENOUS | Status: DC
  Administered 2014-11-06 – 2014-11-12 (×7): 1 g via INTRAVENOUS
  Filled 2014-11-06 (×8): qty 10

## 2014-11-06 MED ORDER — SODIUM CHLORIDE 0.9 % IJ SOLN: 3.0000 mL | Freq: Two times a day (BID) | INTRAMUSCULAR | Status: DC

## 2014-11-06 MED ORDER — HYDROCORTISONE 2.5 % RE CREA
1.0000 "application " | TOPICAL_CREAM | Freq: Two times a day (BID) | RECTAL | Status: DC
  Administered 2014-11-07 – 2014-11-13 (×14): 1 via RECTAL
  Filled 2014-11-06 (×2): qty 28.35

## 2014-11-06 MED ORDER — GADOBENATE DIMEGLUMINE 529 MG/ML IV SOLN
10.0000 mL | Freq: Once | INTRAVENOUS | Status: AC | PRN
  Administered 2014-11-06: 10 mL via INTRAVENOUS

## 2014-11-06 MED ORDER — DEXAMETHASONE SODIUM PHOSPHATE 4 MG/ML IJ SOLN
4.0000 mg | Freq: Three times a day (TID) | INTRAMUSCULAR | Status: DC
  Administered 2014-11-06 – 2014-11-12 (×17): 4 mg via INTRAVENOUS
  Filled 2014-11-06 (×18): qty 1

## 2014-11-06 MED ORDER — GABAPENTIN 300 MG PO CAPS
300.0000 mg | ORAL_CAPSULE | Freq: Three times a day (TID) | ORAL | Status: DC
  Administered 2014-11-06: 300 mg via ORAL
  Filled 2014-11-06 (×2): qty 1

## 2014-11-06 MED ORDER — ACETAMINOPHEN 650 MG RE SUPP
650.0000 mg | Freq: Four times a day (QID) | RECTAL | Status: DC | PRN
  Administered 2014-11-16: 650 mg via RECTAL
  Filled 2014-11-06: qty 1

## 2014-11-06 MED ORDER — LEVETIRACETAM 500 MG/5ML IV SOLN
1000.0000 mg | Freq: Once | INTRAVENOUS | Status: AC
  Administered 2014-11-06: 1000 mg via INTRAVENOUS
  Filled 2014-11-06: qty 10

## 2014-11-06 NOTE — ED Notes (Signed)
Pharmacy called, to send home medications to ED.

## 2014-11-06 NOTE — Consult Note (Signed)
Neurology Consultation Reason for Consult: Altered Mental Status Referring Physician: Darl Householder, D  CC: Altered Mental Status  History is obtained from:Chart  HPI: [name] is a 54 y.o. 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  The subarachnoid was focal localized around her cyst from her previous astrocytoma and her subdural hematoma was also right-sided as well.  She was brought in via EMS from her nursing home due to altered mental status over the course of the day. Apparently she was just not acting like herself. CT scan shows new lesion in the left occipitoparietal region.   ROS: Unable to obtain due to altered mental status.   Past Medical History  Diagnosis Date  . 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 Fracture, ankle 2012    right  . History of radiation therapy 09/23/10- 11/07/10    right parietal lobe  . Chronic headaches     uses oxycodone appropriately  . Anxiety   . Muscle spasticity   . Seizures   . Dysphagia    epistatic Leonel Ramsay so she is agitated what is what she agitated about  Family History: Unable to obtain due to altered mental status.  Social History: Unable to obtain due to altered mental status.  Exam: Current vital signs: BP 134/80 mmHg  Pulse 76  Temp(Src) 99.7 F (37.6 C) (Oral)  Resp 12  SpO2 94%  LMP 08/26/2010 Vital signs in last 24 hours: Temp:  [99.7 F (37.6 C)] 99.7 F (37.6 C) (06/05 2155) Pulse Rate:  [76] 76 (06/05 2155) Resp:  [12] 12 (06/05 2155) BP: (134)/(80) 134/80 mmHg (06/05 2155) SpO2:  [94 %] 94 % (06/05 2155)   Physical Exam  Constitutional: Appears older than stated age Psych: Affect appropriate to situation Eyes: She has some rheum dried on her eyelids HENT: No OP obstrucion Head:  Normocephalic.  Cardiovascular: Normal rate and regular rhythm.  Respiratory: Effort normal  GI: Soft.   Skin: WDI  Neuro: Mental Status: Patient opens eyes to voice, she will follow simple commands to show me her thumb and answer simple questions such as tunnel her name. She perseverates as well as answering nonsensical at times. (he apparently does not speak much even at baseline, it is not clear to me if she actually has an aphasia or just is not very talkative.) Cranial Nerves: II: She does not clearly blink to threat from either direction Pupils are equal, round, and reactive to light.   III,IV, VI: Eyes are midline, I do not see are clearly the past midline to the left V: Facial sensation is decreased on the left VII: Facial movement is notable for left facial droop VIII, X, XI, XII: Unable to assess secondary to patient's altered mental status.  Motor: Tone is increased in all 4 extremities, left much greater than right. I do not see any movement in her left arm, minimal movement in her left leg. She has contractures. She is able to lift her right arm against gravity, and wiggle toes on her right leg. Sensory: She denies sensation in her right leg Cerebellar: Unable to obtain due to altered mental status.  I have reviewed labs in epic and the results pertinent to this consultation are: CMP elevated creatinine UA-positive for UTI  I have reviewed the images obtained:  CT head- evidence of her previous right-sided cervical pathology, also has significant atrophy of the left side greater than would be expected for her age. She also has a hypodensity in the left parieto-occipital area area  Impression: 54 year old female with altered mental status in the setting of urinary tract infection and previous rib pathology. I suspect that her mental status is related to her infection and predisposition. The CT scan does raise concern for possible new lesion, but this would be contralateral to  her previous astrocytoma which is a tumor that tends to recur locally. Further characterization with MRI will be needed.  Recommendations: 1) Keppra 500 mg twice a day 2) treatment of urinary tract infection per internal medicine 3) MRI with/without contrast 4) EEG   Roland Rack, MD [facility] [phone]  If 7pm- 7am, please page neurology on call as listed in [facility] [place].

## 2014-11-06 NOTE — Progress Notes (Signed)
EEG Completed; Results Pending  

## 2014-11-06 NOTE — ED Notes (Signed)
Arrived back from MRI/US.  Linen changed and patient repositioned for comfort.  Saying a few more words than earlier in the shift.  Lab aware of need to draw labs including second set of blood cultures.  Waiting to hang antibiotic.

## 2014-11-06 NOTE — Progress Notes (Signed)
MRI reviewed and shows new heterogeneous enhancing lesion in left occipital lobe 11X9X11 mm with localized edema without mass effect.  EEG shows no seizure activity. Will start patient on Dexamethasone loading dose of 10 mg followed by 4 mg Q8 hours.   Recommend: 1) Further evaluation for possible primary source by primary team.   Will continue to follow with you  Etta Quill PA-C [facility] [phone]  11/06/2014, 5:14 PM

## 2014-11-06 NOTE — ED Notes (Signed)
Checked patient blood sugar it was 79 notified RN of blood sugar

## 2014-11-06 NOTE — ED Notes (Signed)
Pt is alert, able to answer simple questions, but confused about situation. Pt denies pain at the time. Skin dry. Respirations unlabored. Remains on cardiac monitor. Pt updated on plan of care and bed placement.

## 2014-11-06 NOTE — H&P (Signed)
[facility] History and Physical  [name] YKZ:[id number] DOB: 1961-04-24 DOA: 11/05/2014  Referring physician: ED physician PCP: [name], DO  Specialists:   Chief Complaint: Worsening mental status and jerking  HPI: [name] is a 54 y.o. female with PMH of Hyperlipidemia, GERD, depression, anxiety, history of astrocytoma of brain (s/p excision and radiation therapy 2011), seizure, history of SDH and stroke with Left-sided muscle spasm, chronic kidney disease-III, hx of DVT, who presents with worsening mental status and jerking.  She was brought in via EMS from her nursing home due to worsening mental status. At baseline, she could say ' yes or no" to questioning. But since Saturday, she could not interact with staff as she normally did. She seems to be more confused.   In ED, patient was found to have WBC 4.3, temperature 99.7, no tachycardia, INR 1.2, PTT 29, positive urinalysis, troponin negative, AoCKD-III. Patient is admitted to inpatient for further evaluation and treatment. Neurology was consulted.  Where does patient live?   At home    Can patient participate in ADLs?  Yes     Review of Systems: could not be done due to AMS  Allergy:  Allergies  Allergen Reactions  . Vasotec Other (See Comments)    Causes angioedema    Past Medical History  Diagnosis Date  . 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 Fracture, ankle 2012    right  . History of radiation therapy 09/23/10- 11/07/10    right parietal lobe  . Chronic headaches     uses oxycodone appropriately  . Anxiety   . Muscle spasticity   . Seizures   . Dysphagia   . GERD (gastroesophageal reflux disease)     Past Surgical History  Procedure Laterality Date  . Orif ankle fracture  08/22/2010    right  . Radiology with anesthesia N/A 05/02/2013    Procedure: RADIOLOGY WITH ANESTHESIA;  Surgeon: Consuella Lose, MD;  Location: [facility];  Service: Radiology;  Laterality: N/A;   . Brain surgery Right 2012    astrocytoma R parietal lobe    Social History:  reports that she has never smoked. She has never used smokeless tobacco. She reports that she drinks alcohol. She reports that she does not use illicit drugs.  Family History:  Family History  Problem Relation Age of Onset  . Breast cancer Mother      Prior to Admission medications   Medication Sig Start Date End Date Taking? Authorizing Provider  acetaminophen (TYLENOL) 325 MG tablet Take 650 mg by mouth every 4 (four) hours as needed. For pain    Historical Provider, MD  ARIPiprazole (ABILIFY) 5 MG tablet Take 7 mg by mouth daily. For psychosis    Historical Provider, MD  aspirin 325 MG EC tablet Take 325 mg by mouth daily. For cerebral aneurysm    Historical Provider, MD  atorvastatin (LIPITOR) 10 MG tablet Take 10 mg by mouth daily. For lipoproteins    Historical Provider, MD  FLUoxetine (PROZAC) 20 MG capsule Take 60 mg by mouth daily. 07/12/13   Sheila Oats, MD  gabapentin (NEURONTIN) 100 MG capsule Take 300 mg by mouth 2 (two) times daily. Every morning and at bedtime for left side pain    Historical Provider, MD  gabapentin (NEURONTIN) 300 MG capsule Take 300 mg by mouth 3 (three) times daily.    Historical Provider, MD  hydrocortisone (ANUSOL-HC) 2.5 % rectal cream Place 1 application rectally  2 (two) times daily.    Historical Provider, MD  lacosamide (VIMPAT) 50 MG TABS tablet Take one tablet by mouth twice daily 07/17/14   Blanchie Serve, MD  lamoTRIgine (LAMICTAL) 100 MG tablet Take 100 mg by mouth daily. For mood disorder    Historical Provider, MD  levETIRAcetam (KEPPRA) 500 MG tablet Take 1 tablet (500 mg total) by mouth every 12 (twelve) hours. 01/25/13   Chauncey Cruel, MD  magnesium hydroxide (MILK OF MAGNESIA) 400 MG/5ML suspension Take 30 mLs by mouth daily as needed for mild constipation.    Historical Provider, MD  Multiple Vitamin (MULTIVITAMIN) tablet Take 1 tablet by mouth daily.       Historical Provider, MD  pantoprazole (PROTONIX) 40 MG tablet Take 40 mg by mouth daily.    Historical Provider, MD    Physical Exam: Filed Vitals:   11/06/14 0415 11/06/14 0430 11/06/14 0718 11/06/14 0743  BP: 118/71 113/67 104/62 132/69  Pulse: 66 65 56 55  Temp:   98.3 F (36.8 C)   TempSrc:   Oral   Resp: 15 13 18 18   SpO2: 99% 99% 100% 100%   General: drowsy, unable to talk. HEENT:       Eyes: PERRL, EOMI, no scleral icterus.       ENT: No discharge from the ears and nose, no pharynx injection, no tonsillar enlargement.        Neck: No JVD, no bruit, no mass felt. Heme: No neck lymph node enlargement. Cardiac: U2/G2, RRR, 2/6 systolic murmurs, No gallops or rubs. Pulm: No rales, wheezing, rhonchi or rubs. Abd: Soft, nondistended, nontender, no rebound pain, no organomegaly, BS present. Ext: No pitting leg edema bilaterally. 2+DP/PT pulse bilaterally. Musculoskeletal: No joint deformities, No joint redness or warmth, no limitation of ROM in spin. Skin: No rashes.  Neuro: not alert, not oriented X3, both arms has spasm and as well as left leg.  Psych: could not be reviewed.  Labs on Admission:  Basic Metabolic Panel:  Recent Labs Lab 11/05/14 2212 11/05/14 2221 11/06/14 0729  NA 141 144 144  K 3.5 3.5 3.5  CL 112* 112* 117*  CO2 19*  --  20*  GLUCOSE 104* 101* 96  BUN 28* 29* 26*  CREATININE 1.73* 1.80* 1.51*  CALCIUM 10.1  --  9.7   Liver Function Tests:  Recent Labs Lab 11/05/14 2212 11/06/14 0729  AST 32 34  ALT 24 25  ALKPHOS 80 65  BILITOT 0.8 0.7  PROT 5.9* 5.4*  ALBUMIN 3.4* 3.1*   No results for input(s): LIPASE, AMYLASE in the last 168 hours. No results for input(s): AMMONIA in the last 168 hours. CBC:  Recent Labs Lab 11/05/14 2212 11/05/14 2221 11/06/14 0729  WBC 4.3  --  3.3*  NEUTROABS 2.8  --   --   HGB 10.5* 10.2* 9.3*  HCT 32.7* 30.0* 29.3*  MCV 80.5  --  82.3  PLT 108*  --  98*   Cardiac Enzymes: No results for  input(s): CKTOTAL, CKMB, CKMBINDEX, TROPONINI in the last 168 hours.  BNP (last 3 results) No results for input(s): BNP in the last 8760 hours.  ProBNP (last 3 results) No results for input(s): PROBNP in the last 8760 hours.  CBG: No results for input(s): GLUCAP in the last 168 hours.  Radiological Exams on Admission: Ct Head Wo Contrast  11/05/2014   CLINICAL DATA:  Altered mental status.  EXAM: CT HEAD WITHOUT CONTRAST  TECHNIQUE: Contiguous axial images were obtained  from the base of the skull through the vertex without intravenous contrast.  COMPARISON:  Multiple priors.  Most recent MR 07/28/2014.  FINDINGS: Surgical encephalomalacia RIGHT parietal lobe. Post treatment changes with hypoattenuation of the cortex and white matter throughout the RIGHT hemisphere. Slight thickening of the posterior aspect to the resection cavity, may be slightly involuted compared with priors.  Slight hyperattenuation of a 5 mm area of medial LEFT occipital lobe gyrus as seen on 18 and 19, not clearly present on priors. There may be slight associated occipital vasogenic surrounding edema. Recurrent disease not excluded on this noncontrast scan.  Calvarium intact. No acute findings in the orbits, sinuses, or mastoids.  IMPRESSION: Stable to slightly improved changes throughout the RIGHT hemisphere, reflecting previous surgery and radiation.  Possible new finding medial LEFT occipital lobe, slight hyperattenuation and possible associated vasogenic edema. Recurrent disease not excluded. Further evaluation recommended with MRI brain without and with contrast when the patient is stable.   Electronically Signed   By: Rolla Flatten M.D.   On: 11/05/2014 23:12   Mr Jeri Cos OE Contrast  11/06/2014   CLINICAL DATA:  Initial evaluation for altered mental status. History of prior right parietal brain tumor.  EXAM: MRI HEAD WITHOUT AND WITH CONTRAST  TECHNIQUE: Multiplanar, multiecho pulse sequences of the brain and surrounding  structures were obtained without and with intravenous contrast.  CONTRAST:  5mL MULTIHANCE GADOBENATE DIMEGLUMINE 529 MG/ML IV SOLN  COMPARISON:  Prior CT from 11/05/2014 as well as previous MRI from 07/28/2014.  FINDINGS: Study is moderately degraded by motion artifact.  The right parietal resection cavity is similar in size relative to previous study. Restricted diffusion at the superior aspect of the cavity continues to diminish. Layering hematocrit level within the resection cavity again noted. Irregular enhancement along the superior margin of the resection cavity is grossly stable. Additionally, 7 mm focus of nodular enhancement within the periventricular white matter adjacent to the dilated body of the right lateral ventricles also grossly similar (series 16, image 32).  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  Dural thickening with enhancement overlying the right cerebral convexity is similar. Extensive T2 changes throughout the right hemisphere are similar to previous study as well.  Ex vacuo dilatation of the right lateral ventricle again noted, stable. Slight left-to-right midline shift also unchanged. Mild periventricular white matter changes on the left are similar.  Normal intravascular flow voids are maintained.  No acute infarct.  There is a new heterogeneous Truman Hayward enhancing lesion within the medial left occipital lobe measuring 11 x 9 x 11 mm (AP by transverse by craniocaudad). There is localized vasogenic edema without significant mass effect.  Craniocervical junction within normal limits. Pituitary gland normal.  No acute abnormality about the orbits.  Mild mucosal thickening present within the max O sinuses and ethmoidal air cells.  Mastoid air cells are clear.  Inner ear structures normal.  IMPRESSION: 1. New 11 x 9 x 11 mm rim enhancing lesion within the medial left occipital lobe with associated localized vasogenic edema. Finding is worrisome for possible  recurrent disease, although this would be somewhat unusual to reoccur in the contralateral cerebral hemisphere for a tumor that usually demonstrates local recurrence. Metastatic disease is also in the differential, although no other new lesions are identified. There is a 7 mm focus of nodular enhancement adjacent to the dilated body of the right lateral ventricle which is similar to previous exam. 2. Grossly stable postoperative changes within the  right parietal lobe with persistent hematocrit level and some enhancement along the superior margin of the resection cavity. 3. Remote right MCA territory infarcts surrounding the resection cavity with associated laminar necrosis.   Electronically Signed   By: Jeannine Boga M.D.   On: 11/06/2014 06:52   US Renal  11/06/2014   CLINICAL DATA:  Acute kidney injury.  EXAM: RENAL / URINARY TRACT ULTRASOUND COMPLETE  COMPARISON:  None.  FINDINGS: Right Kidney:  Length: 8.0 cm. Increased parenchymal echogenicity. No mass or hydronephrosis visualized. Technically limited evaluation.  Left Kidney:  Length: 7.7 cm. Increased parenchymal echogenicity. No mass or hydronephrosis visualized. Technically limited evaluation.  Bladder:  Appears normal for degree of bladder distention.  IMPRESSION: Increased renal echogenicity consistent with chronic medical renal disease. No obstructive uropathy. Technically limited evaluation of the renal parenchyma for fine detail.   Electronically Signed   By: Jeb Levering M.D.   On: 11/06/2014 06:44   Dg Chest Port 1 View  11/06/2014   CLINICAL DATA:  Altered mental status.  EXAM: PORTABLE CHEST - 1 VIEW  COMPARISON:  07/05/2013  FINDINGS: The heart is at the upper limits of normal in size. Pulmonary vasculature is normal. No consolidation, pleural effusion, or pneumothorax. No acute osseous abnormalities are seen. Unchanged scoliotic curvature of the spine.  IMPRESSION: No acute pulmonary process.   Electronically Signed   By: Jeb Levering M.D.   On: 11/06/2014 00:04    EKG: Independently reviewed.  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  Assessment/Plan Principal Problem:   Acute encephalopathy Active Problems:   Astrocytoma brain tumor   SAH (subarachnoid hemorrhage)   Cerebral aneurysm, nonruptured   Seizures   Depression with anxiety   Dysphagia   Acute renal failure superimposed on stage 3 chronic kidney disease   Left spastic hemiparesis   History of DVT (deep vein thrombosis)   Dyslipidemia   Stroke   GERD (gastroesophageal reflux disease)   UTI (lower urinary tract infection)   Acute encephalopath: Etiology is not completely clear. May be partially due to UTI. CT scan shows new lesion in the left occipitoparietal region. Neurology was consulted. Dr. Leonel Ramsay so patient. -will admit to sdu -Appreciate Dr. Cecil Cobbs consultation, with follow-up recommendations as follows: 1) Keppra 500 mg twice a day 2) treatment of urinary tract infection 3) MRI with/without contrast 4) EEG -Frequency neuro check  UTI: Patient has positive urinalysis, not septic. -Rocephin IV --will get Procalcitonin and trend lactic acid level  -IVF: 1L of NS bolus in ED, followed by 100 cc/h -blood and urine culture  Seizure:  - Keppra 500 mg twice a day IV -continue Vimpat and lamictal when pt can take it  Hx of astrocytoma: The CT scan findings raise concern for possible new lesion, but this would be contralateral to her previous astrocytoma which is a tumor that tends to recur locally per Dr. Leonel Ramsay. -will get MRI per Dr. Leonel Ramsay  HLD: Last LDL was 118 on 08/25/13. -Continue home medications: Lipitor  History of stroke: -Aspirin, Lipitor  AoCKD-III: Baseline Cre is 1.1-1.5. Her creatinine is 1.8, BUN 29 on admission. Likely due to prerenal secondary to dehydration  -IVF as above -Check FeUrea -US-renal -Follow up renal function by BMP  GERD: -Protonix   Depression:  -Continue  Abilify, Prozac   DVT ppx: SQ Heparin      Code Status: Full code Family Communication: None at bed side.      Disposition Plan: Admit to inpatient   Date  of Service 11/06/2014    Ivor Costa [facility] Pager [phone]  If 7PM-7AM, please contact night-coverage [email or web address] Password [email or web address] 11/06/2014, 9:04 AM

## 2014-11-06 NOTE — Progress Notes (Signed)
PT Cancellation Note  Patient Details Name: [name] MRN: [id number] DOB: 07-04-1960   Cancelled Treatment:    Reason Eval/Treat Not Completed: Patient not medically ready   Noted pt on bedrest (has 2 bedrest orders--one for 12 hours and also strict bedrest with no end time). Noted pt to be admitted. Will follow-up 6/7.   Trany Chernick 11/06/2014, 8:25 AM  Pager [phone]

## 2014-11-06 NOTE — ED Notes (Signed)
Pt in EEG at the time.

## 2014-11-06 NOTE — Progress Notes (Signed)
[facility] TEAM 1 - Stepdown/ICU TEAM Progress Note  TAYGAN CONNELL WVP:[id number] DOB: Jun 27, 1960 DOA: 11/05/2014 PCP: [name], DO  Admit HPI / Brief Narrative: 54 y.o. female with hx of Hyperlipidemia, GERD, depression, anxiety, astrocytoma of brain (s/p excision and radiation therapy 2011), seizure, SDH, CVA, chronic kidney disease III, and DVT who presented with worsening mental status and jerking.  She was brought in via EMS from her nursing home due to worsening mental status. At baseline, she could say ' yes or no" to questioning.  Acutely she could not interact with staff as she normally did. .   In ED patient was found to have WBC 4.3, temperature 99.7, no tachycardia, INR 1.2, PTT 29, positive urinalysis, troponin negative, AoCKD-III.   HPI/Subjective: Patient is seen for follow-up exam.  Assessment/Plan:  Acute encephalopathy  New 11 x 9 x 11 mm rim enhancing lesion within the medial left occipital lobe - Hx of astrocytoma  UTI  Seizure  HLD  History of stroke  Acute on CKD stage 3  GERD  Depression  Code Status: FULL Family Communication: Spoke with father/POA at bedside Disposition Plan: SDU  Consultants: Neurology  Procedures: None  Antibiotics: Rocephin 6/6 >  DVT prophylaxis: SCDs  Objective: Blood pressure 141/74, pulse 62, temperature 98.3 F (36.8 C), temperature source Oral, resp. rate 10, weight 66.1 kg (145 lb 11.6 oz), last menstrual period 08/26/2010, SpO2 100 %.  Intake/Output Summary (Last 24 hours) at 11/06/14 1539 Last data filed at 11/06/14 0019  Gross per 24 hour  Intake      0 ml  Output     25 ml  Net    -25 ml   Exam: Follow-up exam completed  Data Reviewed: Basic Metabolic Panel:  Recent Labs Lab 11/05/14 2212 11/05/14 2221 11/06/14 0729  NA 141 144 144  K 3.5 3.5 3.5  CL 112* 112* 117*  CO2 19*  --  20*  GLUCOSE 104* 101* 96  BUN 28* 29* 26*  CREATININE 1.73* 1.80* 1.51*  CALCIUM 10.1  --  9.7     CBC:  Recent Labs Lab 11/05/14 2212 11/05/14 2221 11/06/14 0729  WBC 4.3  --  3.3*  NEUTROABS 2.8  --   --   HGB 10.5* 10.2* 9.3*  HCT 32.7* 30.0* 29.3*  MCV 80.5  --  82.3  PLT 108*  --  98*    Liver Function Tests:  Recent Labs Lab 11/05/14 2212 11/06/14 0729  AST 32 34  ALT 24 25  ALKPHOS 80 65  BILITOT 0.8 0.7  PROT 5.9* 5.4*  ALBUMIN 3.4* 3.1*    Coags:  Recent Labs Lab 11/05/14 2212  INR 1.20    Recent Labs Lab 11/05/14 2212  APTT 29   CBG:  Recent Labs Lab 11/06/14 1105 11/06/14 1527  GLUCAP 79 75    Studies:   Recent x-ray studies have been reviewed in detail by the Attending Physician  Scheduled Meds:  Scheduled Meds: . sodium chloride   Intravenous STAT  . ARIPiprazole  7 mg Oral Daily  . aspirin  325 mg Oral Daily  . atorvastatin  10 mg Oral Daily  . cefTRIAXone (ROCEPHIN)  IV  1 g Intravenous Q24H  . FLUoxetine  60 mg Oral Daily  . gabapentin  300 mg Oral TID  . gabapentin  300 mg Oral BID  . heparin  5,000 Units Subcutaneous 3 times per day  . hydrocortisone  1 application Rectal BID  . lacosamide  50  mg Oral BID  . lamoTRIgine  100 mg Oral Daily  . levETIRAcetam  500 mg Intravenous Q12H  . levETIRAcetam  500 mg Oral Q12H  . multivitamin  1 tablet Oral Daily  . pantoprazole  40 mg Oral Daily  . sodium chloride  3 mL Intravenous Q12H    Time spent on care of this patient: No charge   Cherene Altes , MD   [facility] Office  [phone] Pager - Text Page per Amion as per below:  On-Call/Text Page:      [email or web address]      password [email or web address]  If 7PM-7AM, please contact night-coverage [email or web address] Password [email or web address] 11/06/2014, 3:39 PM   LOS: 0 days

## 2014-11-06 NOTE — ED Notes (Signed)
Taken for MRI then U/S at this time.

## 2014-11-06 NOTE — ED Notes (Signed)
Pt resting quietly at the time. Vital signs stable. Lab called to draw.

## 2014-11-06 NOTE — Procedures (Signed)
ELECTROENCEPHALOGRAM REPORT   Patient: [name]      Room #: J82 Age: 54 y.o.        Sex: female Referring Physician: Dr Blaine Hamper Report Date:  11/06/2014        Interpreting Physician: Hulen Luster  History: [name] is an 54 y.o. female hx of prior brain tumor, CVA and SAH presenting with altered mental status.   Medications:  Scheduled: . ARIPiprazole  7 mg Oral Daily  . aspirin  325 mg Oral Daily  . atorvastatin  10 mg Oral Daily  . FLUoxetine  60 mg Oral Daily  . gabapentin  300 mg Oral TID  . gabapentin  300 mg Oral BID  . heparin  5,000 Units Subcutaneous 3 times per day  . hydrocortisone  1 application Rectal BID  . lacosamide  50 mg Oral BID  . lamoTRIgine  100 mg Oral Daily  . levETIRAcetam  500 mg Oral Q12H  . multivitamin  1 tablet Oral Daily  . pantoprazole  40 mg Oral Daily  . sodium chloride  3 mL Intravenous Q12H    Conditions of Recording:  This is a 16 channel EEG carried out with the patient in the drowsy state.  Description:  The waking background activity consists of a low voltage, symmetrical, fairly well organized, theta activity, seen from the parieto-occipital and posterior temporal regions.  Low voltage fast activity, poorly organized, is seen anteriorly and is at times superimposed on more posterior regions.  A mixture of theta and alpha rhythms are seen from the central and temporal regions. Intermittent focal slowing is noted in the left posterior region. No clear evolution is noted. No epileptiform activity is noted.   Normal sleep architecture is not observed. Intermittent photic stimulation and hyperventilation was not performed.    IMPRESSION: Abnormal EEG due to generalized slowing indicating a mild to moderate cerebral disturbance (encephalopathy). Intermittent focal slowing is noted in the left posterior region indicating a potential seizure foci. No epileptiform activity noted.    Jim Like,  DO [facility] [phone]  If 7pm- 7am, please page neurology on call as listed in AMION. 11/06/2014, 10:54 AM

## 2014-11-06 NOTE — ED Notes (Signed)
Patient has not returned to the unit yet.  To start iv fluid and antibiotic on her arrival back to the unit.

## 2014-11-06 NOTE — Clinical Social Work Note (Signed)
Clinical Social Work Assessment  Patient Details  Name: [name] MRN: [id number] Date of Birth: 03/23/1961  Date of referral:  11/06/14               Reason for consult:  Other (Comment Required) (Patient is from a facility)                Permission sought to share information with:  Case Manager, Customer service manager, Family Supports Permission granted to share information::  Yes, Verbal Permission Granted  Name::     [name] (spouse) [phone]  Agency::  [facility]  Relationship::     Contact Information:     Housing/Transportation Living arrangements for the past 2 months:  [place] of Information:  Spouse, Medical Team Patient Interpreter Needed:  None Criminal Activity/Legal Involvement Pertinent to Current Situation/Hospitalization:  No - Comment as needed Significant Relationships:  Parents, Spouse Lives with:  Facility Resident Do you feel safe going back to the place where you live?  Yes Need for family participation in patient care:  Yes (Comment) (due to AMS)  Care giving concerns:  No family at the bedside. Patient has AMS and unable to give accurate history. Chart reviewed and patient is from [facility].  Has been a long term care resident at [facility] for over 2-3 years after she suffered stroke per her husband.   Social Worker assessment / plan:  LCSW received consult that patient is from a facility. Patient is a long term care patient at [facility] [place].  This was confirmed with RN liason Tammy who is aware of patient and aware patient being admitted. Patient is able to return once medically stable. LCSW made contact with patient's husband. He is currently on his way to hospital from work to see patient. Patient's father is also involved, no contact made, but will be coming to see patient from [place] per husband. Husband reports he is happy with care at SNF, reports she just has not been able to come  home after the Stroke she suffered.  Plan will be for patient to return to SNF once medically cleared. FL2 updated for patient.  Will need to be updated again prior to DC.  Employment status:  Retired, Disabled (Comment on whether or not currently receiving Disability) Insurance information:  Medicare, Futures trader, Medicaid In [place] PT Recommendations:  [place] / Referral to community resources:  [place]  Patient/Family's Response to care: Agreeable to plan to admit per husband  Patient/Family's Understanding of and Emotional Response to Diagnosis, Current Treatment, and Prognosis:  Husband reports he is coming from work, agreeable with plan to admit to rule out another brain bleed/stroke per his report. He reports no current needs or problems at this time.  On the phone husband sounds frazzled and anxious to get to the hospital to be with patient.  Emotional Assessment Appearance:  Appears older than stated age Attitude/Demeanor/Rapport:  Lethargic Affect (typically observed):  Stable Orientation:  Oriented to Self Alcohol / Substance use:  Not Applicable Psych involvement (Current and /or in the community):  No (Comment)  Discharge Needs  Concerns to be addressed:  Denies Needs/Concerns at this time Readmission within the last 30 days:  No Current discharge risk:  None Barriers to Discharge:  Continued Medical Work up (Patient being admitted to medical unit)   Lilly [place], LCSW 11/06/2014, 10:50 AM

## 2014-11-07 DIAGNOSIS — N189 Chronic kidney disease, unspecified: Secondary | ICD-10-CM | POA: Diagnosis present

## 2014-11-07 DIAGNOSIS — E785 Hyperlipidemia, unspecified: Secondary | ICD-10-CM | POA: Diagnosis present

## 2014-11-07 DIAGNOSIS — F32A Depression, unspecified: Secondary | ICD-10-CM | POA: Diagnosis present

## 2014-11-07 DIAGNOSIS — D61818 Other pancytopenia: Secondary | ICD-10-CM

## 2014-11-07 DIAGNOSIS — N179 Acute kidney failure, unspecified: Secondary | ICD-10-CM | POA: Diagnosis present

## 2014-11-07 DIAGNOSIS — F329 Major depressive disorder, single episode, unspecified: Secondary | ICD-10-CM | POA: Diagnosis present

## 2014-11-07 DIAGNOSIS — G939 Disorder of brain, unspecified: Secondary | ICD-10-CM | POA: Diagnosis present

## 2014-11-07 DIAGNOSIS — N390 Urinary tract infection, site not specified: Secondary | ICD-10-CM | POA: Diagnosis present

## 2014-11-07 LAB — COMPREHENSIVE METABOLIC PANEL
ALK PHOS: 75 U/L (ref 38–126)
ALT: 43 U/L (ref 14–54)
ANION GAP: 9 (ref 5–15)
AST: 59 U/L — AB (ref 15–41)
Albumin: 3.2 g/dL — ABNORMAL LOW (ref 3.5–5.0)
BUN: 22 mg/dL — AB (ref 6–20)
CO2: 21 mmol/L — ABNORMAL LOW (ref 22–32)
Calcium: 9.9 mg/dL (ref 8.9–10.3)
Chloride: 113 mmol/L — ABNORMAL HIGH (ref 101–111)
Creatinine, Ser: 1.24 mg/dL — ABNORMAL HIGH (ref 0.44–1.00)
GFR calc non Af Amer: 48 mL/min — ABNORMAL LOW (ref >60)
GFR, EST AFRICAN AMERICAN: 55 mL/min — AB (ref >60)
GLUCOSE: 122 mg/dL — AB (ref 65–99)
Potassium: 3.9 mmol/L (ref 3.5–5.1)
Sodium: 143 mmol/L (ref 135–145)
Total Bilirubin: 0.6 mg/dL (ref 0.3–1.2)
Total Protein: 6.1 g/dL — ABNORMAL LOW (ref 6.5–8.1)

## 2014-11-07 LAB — GLUCOSE, CAPILLARY
Glucose-Capillary: 113 mg/dL — ABNORMAL HIGH (ref 65–99)
Glucose-Capillary: 116 mg/dL — ABNORMAL HIGH (ref 65–99)
Glucose-Capillary: 103 mg/dL — ABNORMAL HIGH (ref 65–99)

## 2014-11-07 LAB — CBC
HEMATOCRIT: 32.2 % — AB (ref 36.0–46.0)
HEMOGLOBIN: 10.1 g/dL — AB (ref 12.0–15.0)
MCH: 25.8 pg — AB (ref 26.0–34.0)
MCHC: 31.4 g/dL (ref 30.0–36.0)
MCV: 82.1 fL (ref 78.0–100.0)
Platelets: 111 10*3/uL — ABNORMAL LOW (ref 150–400)
RBC: 3.92 MIL/uL (ref 3.87–5.11)
RDW: 14.9 % (ref 11.5–15.5)
WBC: 2.7 10*3/uL — ABNORMAL LOW (ref 4.0–10.5)

## 2014-11-07 LAB — UREA NITROGEN, URINE: UREA NITROGEN UR: 670 mg/dL

## 2014-11-07 NOTE — Progress Notes (Signed)
OT Cancellation Note  Patient Details Name: [name] MRN: [id number] DOB: [birth year]   Cancelled Treatment:    Reason Eval/Treat Not Completed: OT screened, no needs identified, will sign off. Pt is Medicare current D/C plan is SNF (pt from SNF and was total A with ADLs). No apparent immediate acute care OT needs, therefore will defer OT to SNF. If OT eval is needed please call Acute Rehab Dept. at [phone] or text page OT at [phone].    Benito Mccreedy OTR/L [phone] 11/07/2014, 1:53 PM

## 2014-11-07 NOTE — Progress Notes (Signed)
Patient ID: [name], female   DOB: [birth year], 54 y.o.   MRN: [id number]  [facility]     Allergies  Allergen Reactions  . Vasotec Other (See Comments)    Causes angioedema       Chief Complaint  Patient presents with  . Acute Visit    pain management     HPI:  Therapy reports that she is having increased tone in her left arm and hand which is causing her a great deal of pain present. She is unable to fully participate in the hpi or ros; but states that she is having pain present.   Past Medical History  Diagnosis Date  . 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 Fracture, ankle 2012    right  . History of radiation therapy 09/23/10- 11/07/10    right parietal lobe  . Chronic headaches     uses oxycodone appropriately  . Anxiety   . Muscle spasticity   . Seizures   . Dysphagia   . GERD (gastroesophageal reflux disease)     Past Surgical History  Procedure Laterality Date  . Orif ankle fracture  08/22/2010    right  . Radiology with anesthesia N/A 05/02/2013    Procedure: RADIOLOGY WITH ANESTHESIA;  Surgeon: Consuella Lose, MD;  Location: [facility];  Service: Radiology;  Laterality: N/A;  . Brain surgery Right 2012    astrocytoma R parietal lobe    VITAL SIGNS BP 135/66 mmHg  Pulse 76  Ht 5\' 3"  (1.6 m)  Wt 154 lb (69.854 kg)  BMI 27.29 kg/m2  SpO2 99%  LMP 08/26/2010    Outpatient Encounter Prescriptions as of 10/12/2014  Medication Sig  . ARIPiprazole (ABILIFY) 5 MG tablet Take 7 mg by mouth daily. For psychosis  . aspirin 325 MG EC tablet Take 325 mg by mouth daily. For cerebral aneurysm  . atorvastatin (LIPITOR) 10 MG tablet Take 10 mg by mouth daily at 6 PM. For lipoproteins  . FLUoxetine (PROZAC) 20 MG capsule Take 60 mg by mouth daily.  Marland Kitchen gabapentin (NEURONTIN) 300 MG capsule Take 300 mg by mouth 3 (three) times daily.  Marland Kitchen lacosamide (VIMPAT) 50 MG TABS tablet Take one tablet by mouth twice daily  . lamoTRIgine (LAMICTAL) 100  MG tablet Take 150 mg by mouth daily. For mood disorder  . levETIRAcetam (KEPPRA) 500 MG tablet Take 1 tablet (500 mg total) by mouth every 12 (twelve) hours.  . Multiple Vitamin (MULTIVITAMIN) tablet Take 1 tablet by mouth daily.       SIGNIFICANT DIAGNOSTIC EXAMS  07-28-14: mri of brain: 1. Stable postoperative changes the right parietal lobe with a persistent hematocrit level in some enhancement along the superior margin of the surgical cavity without significant interval change. The areas of enhancement correspond to the infarct of 2014. There is persistent enhancement along the more inferior areas of the right MCA territory infarct is well. 2. 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 3. Diffuse dural enhancement over the right convexity is stable. 4. Significant signal loss in the right hemisphere is unchanged.   08-04-14: mammogram: No mammographic evidence of malignancy. A result letter of this screening mammogram will be mailed directly to the patient. RECOMMENDATION: Screening mammogram in one year. (Code:SM-B-01Y)      LABS REVIEWED:  03-14-14: wbc 5.0; hgb 10.6; hct 34.2; mcv 83.5; plt 130; glucose 83; bun 27; creat 1.48; k+4.4; na++145; ca++ 10.6; chol 196;  ldl 122; trig 129; hgb a1c 5.0 07-24-14: hgb a1c 5.3    Review of Systems  Unable to perform ROS    Physical Exam Constitutional: She appears well-developed and well-nourished. No distress.  Neck: Neck supple. No JVD present. No thyromegaly present.  Cardiovascular: Normal rate, regular rhythm and intact distal pulses.   Respiratory: Effort normal and breath sounds normal. No respiratory distress.  GI: Soft. Bowel sounds are normal. She exhibits no distension.  Musculoskeletal: She exhibits no edema.  Left facial drooping  Left side flaccid Left hand in a splint is having pain in left had an arm has increased tone in left arm.     Neurological:  She is alert.  Skin: Skin is warm and dry. She is not diaphoretic.       ASSESSMENT/ PLAN:  1. Left side spastic  Hemiparesis status post cva: is without change will continue asa 325 mg daily; will continue neurontin  300 mg three times daily will begin baclofen 5 mg three times daily and will being tylenol 650 mg three times daily and will monitor      Ok Edwards NP [facility]  Contact [phone] Monday through Friday 8am- 5pm  After hours call [phone]

## 2014-11-07 NOTE — Progress Notes (Signed)
[facility] TEAM 1 - Stepdown/ICU TEAM Progress Note  [name] WYO:[id number] DOB: Feb 04, 1961 DOA: 11/05/2014 PCP: [name], DO  Admit HPI / Brief Narrative: 54 y.o. 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  Presents with worsening mental status and jerking. She was brought in via EMS from her nursing home due to worsening mental status. At baseline, she could say ' yes or no" to questioning. But since Saturday, she could not interact with staff as she normally did. She seems to be more confused.   In ED, patient was found to have WBC 4.3, temperature 99.7, no tachycardia, INR 1.2, PTT 29, positive urinalysis, troponin negative, AoCKD-III. Patient is admitted to inpatient for further evaluation and treatment. Neurology was consulted.   HPI/Subjective: 6/7 patient with eyes closed but will follow all commands, word salad/expressive aphasia when answering questions.  Assessment/Plan: Acute encephalopathy  New 11 x 9 x 11 mm rim enhancing lesion within the medial left occipital lobe/ Hx of astrocytoma -Dexamethasone per neuro hospitalist  UTI -Continue Rocephin and complete 5 day course of antibiotics.  Seizure -EEG not consistent with seizure -Continue Vimpat 50 mg BID; -Continue Lamictal 100 mg daily; level pending -Continue Keppra 500 mg BID; level pending  HLD -The panel pending  History of stroke  Acute on CKD stage 3 -Improving -Continue normal saline 168m/hr  GERD  Depression -Continue Abilify 7 mg daily  Pancytopenia -Fairly recent, anemia panel pending, LDH, haptoglobin, TNA, RNP, ESR, CRP -Dependent upon findings hematology may need to be consulted   Code Status: FULL Family Communication: no family present at time of exam Disposition Plan: per neurology   Consultants: DBarry Dienes(neuro hospitalist)  Procedure/Significant Events: 6/6 EEG;-Abnormal EEG due to generalized slowing indicating a mild to moderate cerebral disturbance (encephalopathy). Intermittent focal slowing is noted in the left posterior region indicating a potential seizure foci. 6/6 MRI brain with and without contrast;-New 11 x 9 x 11 mm rim enhancing lesion within the medial left occipital lobe with associated localized vasogenic edema.  -7 mm focus of nodular enhancement adjacent to the dilated body of the right lateral ventricle which is similar to previous exam. -Remote right MCA territory infarcts   Culture   Antibiotics: Rocephin 6/6>>  DVT prophylaxis: SCD   Devices    LINES / TUBES:      Continuous Infusions: . sodium chloride 100 mL/hr (11/07/14 0513)    Objective: VITAL SIGNS: Temp: 97.9 F (36.6 C) (06/07 1559) Temp Source: Axillary (06/07 1559) BP: 125/81 mmHg (06/07 1700) Pulse Rate: 73 (06/07 1700) SPO2; FIO2:   Intake/Output Summary (Last 24 hours) at 11/07/14 1916 Last data filed at 11/07/14 1500  Gross per 24 hour  Intake   2310 ml  Output      0 ml  Net   2310 ml     Exam: General:patient with eyes closed but will follow all commands, word salad/expressive aphasia when answering questions, No acute respiratory distress Eyes: , negative retinal hemorrhage ENT: Negative Runny nose, negative gingival bleeding, Neck:  Negative scars, masses, torticollis, lymphadenopathy, JVD Lungs: Clear to auscultation bilaterally without wheezes or crackles Cardiovascular: Regular rate and rhythm without murmur gallop or rub normal S1 and S2 Abdomen:negative abdominal pain, negative dysphagia, Nontender, nondistended, soft, bowel sounds positive, no rebound, no ascites, no appreciable mass, PEG tube in place negative sign of infection around site Extremities:  No significant cyanosis, clubbing, or edema bilateral lower extremities Neurologic:  Patient able  to move bilateral lower extremities to command, right upper extremity to command, unable to move left upper extremity to command. When asked questions word salad/expressive aphasia. Did not attempt to ambulate patient    Data Reviewed: Basic Metabolic Panel:  Recent Labs Lab 11/05/14 2212 11/05/14 2221 11/06/14 0729 11/07/14 0217  NA 141 144 144 143  K 3.5 3.5 3.5 3.9  CL 112* 112* 117* 113*  CO2 19*  --  20* 21*  GLUCOSE 104* 101* 96 122*  BUN 28* 29* 26* 22*  CREATININE 1.73* 1.80* 1.51* 1.24*  CALCIUM 10.1  --  9.7 9.9   Liver Function Tests:  Recent Labs Lab 11/05/14 2212 11/06/14 0729 11/07/14 0217  AST 32 34 59*  ALT 24 25 43  ALKPHOS 80 65 75  BILITOT 0.8 0.7 0.6  PROT 5.9* 5.4* 6.1*  ALBUMIN 3.4* 3.1* 3.2*   No results for input(s): LIPASE, AMYLASE in the last 168 hours. No results for input(s): AMMONIA in the last 168 hours. CBC:  Recent Labs Lab 11/05/14 2212 11/05/14 2221 11/06/14 0729 11/07/14 0217  WBC 4.3  --  3.3* 2.7*  NEUTROABS 2.8  --   --   --   HGB 10.5* 10.2* 9.3* 10.1*  HCT 32.7* 30.0* 29.3* 32.2*  MCV 80.5  --  82.3 82.1  PLT 108*  --  98* 111*   Cardiac Enzymes: No results for input(s): CKTOTAL, CKMB, CKMBINDEX, TROPONINI in the last 168 hours. BNP (last 3 results) No results for input(s): BNP in the last 8760 hours.  ProBNP (last 3 results) No results for input(s): PROBNP in the last 8760 hours.  CBG:  Recent Labs Lab 11/06/14 1105 11/06/14 1527 11/07/14 0014 11/07/14 0811 11/07/14 1558  GLUCAP 79 75 113* 116* 103*    Recent Results (from the past 240 hour(s))  Culture, blood (x 2)     Status: None (Preliminary result)   Collection Time: 11/06/14  4:35 AM  Result Value Ref Range Status   Specimen Description BLOOD LEFT ARM  Final   Special Requests BAA 5ML  Final   Culture   Final           BLOOD CULTURE RECEIVED NO GROWTH TO DATE CULTURE WILL BE HELD FOR 5 DAYS BEFORE ISSUING A FINAL NEGATIVE REPORT Performed at  [facility]    Report Status PENDING  Incomplete  Culture, blood (x 2)     Status: None (Preliminary result)   Collection Time: 11/06/14  7:30 AM  Result Value Ref Range Status   Specimen Description BLOOD LEFT ASSIST CONTROL  Final   Special Requests BAA 10ML  Final   Culture   Final           BLOOD CULTURE RECEIVED NO GROWTH TO DATE CULTURE WILL BE HELD FOR 5 DAYS BEFORE ISSUING A FINAL NEGATIVE REPORT Performed at [facility]    Report Status PENDING  Incomplete  Urine culture     Status: None (Preliminary result)   Collection Time: 11/06/14  7:50 AM  Result Value Ref Range Status   Specimen Description URINE, CLEAN CATCH  Final   Special Requests NONE  Final   Colony Count   Final    >=100,000 COLONIES/ML Performed at [facility]    Culture   Final    ESCHERICHIA COLI Performed at [facility]    Report Status PENDING  Incomplete  MRSA PCR  Screening     Status: None   Collection Time: 11/06/14  3:45 PM  Result Value Ref Range Status   MRSA by PCR NEGATIVE NEGATIVE Final    Comment:        The GeneXpert MRSA Assay (FDA approved for NASAL specimens only), is one component of a comprehensive MRSA colonization surveillance program. It is not intended to diagnose MRSA infection nor to guide or monitor treatment for MRSA infections.      Studies:  Recent x-ray studies have been reviewed in detail by the Attending Physician  Scheduled Meds:  Scheduled Meds: . antiseptic oral rinse  7 mL Mouth Rinse q12n4p  . ARIPiprazole  7 mg Oral Daily  . cefTRIAXone (ROCEPHIN)  IV  1 g Intravenous Q24H  . chlorhexidine  15 mL Mouth Rinse BID  . dexamethasone  4 mg Intravenous 3 times per day  . hydrocortisone  1 application Rectal BID  . lacosamide  50 mg Oral BID  . lamoTRIgine  100 mg Oral Daily  . levETIRAcetam  500 mg Intravenous Q12H  . pantoprazole (PROTONIX) IV  40 mg Intravenous Q24H    Time spent on care of this patient: 40  mins   Gilberto Streck, Geraldo Docker , MD  [facility] Office  [phone] Pager - [phone]  On-Call/Text Page:      [email or web address]      password [email or web address]  If 7PM-7AM, please contact night-coverage [email or web address] Password [email or web address] 11/07/2014, 7:16 PM   LOS: 1 day   Care during the described time interval was provided by me .  I have reviewed this patient's available data, including medical history, events of note, physical examination, and all test results as part of my evaluation. I have personally reviewed and interpreted all radiology studies.   Dia Crawford, MD [phone] Pager

## 2014-11-07 NOTE — Clinical Social Work Note (Signed)
Patient is from [facility] and plans to return once medically stable. Psychosocial assessment and FL-2 completed by ED CSW.  CSW will continue to follow patient and pt's family for continued support and to facilitate pt's discharge to [facility] once medically stable.   Glendon Axe, MSW, LCSWA [phone] 11/07/2014 9:42 AM

## 2014-11-07 NOTE — Progress Notes (Addendum)
Subjective: Patient awakens to voice.  Repeats "there is a black cat and a white cat".  Will not answer questions approprietly.   Objective: Current vital signs: BP 133/76 mmHg  Pulse 70  Temp(Src) 98 F (36.7 C) (Oral)  Resp 12  Ht 5\' 9"  (1.753 m)  Wt 66.1 kg (145 lb 11.6 oz)  BMI 21.51 kg/m2  SpO2 98%  LMP 08/26/2010 Vital signs in last 24 hours: Temp:  [97.9 F (36.6 C)-98.7 F (37.1 C)] 98 F (36.7 C) (06/07 0813) Pulse Rate:  [57-78] 70 (06/07 0800) Resp:  [9-19] 12 (06/07 0800) BP: (111-147)/(63-87) 133/76 mmHg (06/07 0800) SpO2:  [98 %-100 %] 98 % (06/07 0800) Weight:  [66.1 kg (145 lb 11.6 oz)] 66.1 kg (145 lb 11.6 oz) (06/06 1522)  Intake/Output from previous day: 06/06 0701 - 06/07 0700 In: 2615 [I.V.:2405; IV Piggyback:210] Out: -  Intake/Output this shift: Total I/O In: 100 [I.V.:100] Out: -  Nutritional status: Diet NPO time specified Except for: Sips with Meds  Neurologic Exam: General: Mental Status: Drowsy, keeps eyes closed, repeating " I see a black cat and white cat" clearly.  Other conversation is not appropriate.  Does not follow verbal commands but when asked how many fingers she see--she will say "multple fingers". . Cranial Nerves: II: no blink to threat, pupils equal, round, reactive to light and accommodation III,IV, VI: keeps eyes closed tightly,  Doll's maneuver intact when eyelids held open.  V,VII: face symmetric, states ouch to periocular pain VIII: hearing normal bilaterally IX,X: uvula rises symmetrically XI: bilateral shoulder shrug XII: midline tongue extension   Motor: Right : Upper extremity   4/5    Left:     Upper extremity   0/5 fist held clenched shut and arm in flexion  Lower extremity   3/5     Lower extremity   0/5 --resists movement in right arm and states ouch when arm moved.  Tone and bulk:normal tone throughout; no atrophy noted Sensory: Pinprick and light touch intact throughout, bilaterally Deep Tendon Reflexes:   1+ right bicep and brachioradialis. 2+ in left bicep and brachioradialis. No KJ or AJ.   Plantars: Mute bilaterally Cerebellar: Unable to test due to mental status    Lab Results: Basic Metabolic Panel:  Recent Labs Lab 11/05/14 2212 11/05/14 2221 11/06/14 0729 11/07/14 0217  NA 141 144 144 143  K 3.5 3.5 3.5 3.9  CL 112* 112* 117* 113*  CO2 19*  --  20* 21*  GLUCOSE 104* 101* 96 122*  BUN 28* 29* 26* 22*  CREATININE 1.73* 1.80* 1.51* 1.24*  CALCIUM 10.1  --  9.7 9.9    Liver Function Tests:  Recent Labs Lab 11/05/14 2212 11/06/14 0729 11/07/14 0217  AST 32 34 59*  ALT 24 25 43  ALKPHOS 80 65 75  BILITOT 0.8 0.7 0.6  PROT 5.9* 5.4* 6.1*  ALBUMIN 3.4* 3.1* 3.2*   No results for input(s): LIPASE, AMYLASE in the last 168 hours. No results for input(s): AMMONIA in the last 168 hours.  CBC:  Recent Labs Lab 11/05/14 2212 11/05/14 2221 11/06/14 0729 11/07/14 0217  WBC 4.3  --  3.3* 2.7*  NEUTROABS 2.8  --   --   --   HGB 10.5* 10.2* 9.3* 10.1*  HCT 32.7* 30.0* 29.3* 32.2*  MCV 80.5  --  82.3 82.1  PLT 108*  --  98* 111*    Cardiac Enzymes: No results for input(s): CKTOTAL, CKMB, CKMBINDEX, TROPONINI in the last 168  hours.  Lipid Panel: No results for input(s): CHOL, TRIG, HDL, CHOLHDL, VLDL, LDLCALC in the last 168 hours.  CBG:  Recent Labs Lab 11/06/14 1105 11/06/14 1527 11/07/14 0014  GLUCAP 79 75 113*    Microbiology: Results for orders placed or performed during the hospital encounter of 11/05/14  Culture, blood (x 2)     Status: None (Preliminary result)   Collection Time: 11/06/14  4:35 AM  Result Value Ref Range Status   Specimen Description BLOOD LEFT ARM  Final   Special Requests BAA 5ML  Final   Culture   Final           BLOOD CULTURE RECEIVED NO GROWTH TO DATE CULTURE WILL BE HELD FOR 5 DAYS BEFORE ISSUING A FINAL NEGATIVE REPORT Performed at [facility]    Report Status PENDING  Incomplete  Culture, blood (x 2)      Status: None (Preliminary result)   Collection Time: 11/06/14  7:30 AM  Result Value Ref Range Status   Specimen Description BLOOD LEFT ASSIST CONTROL  Final   Special Requests BAA 10ML  Final   Culture   Final           BLOOD CULTURE RECEIVED NO GROWTH TO DATE CULTURE WILL BE HELD FOR 5 DAYS BEFORE ISSUING A FINAL NEGATIVE REPORT Performed at [facility]    Report Status PENDING  Incomplete  MRSA PCR Screening     Status: None   Collection Time: 11/06/14  3:45 PM  Result Value Ref Range Status   MRSA by PCR NEGATIVE NEGATIVE Final    Comment:        The GeneXpert MRSA Assay (FDA approved for NASAL specimens only), is one component of a comprehensive MRSA colonization surveillance program. It is not intended to diagnose MRSA infection nor to guide or monitor treatment for MRSA infections.     Coagulation Studies:  Recent Labs  11/05/14 2212  LABPROT 15.3*  INR 1.20    Imaging: Ct Head Wo Contrast  11/05/2014   CLINICAL DATA:  Altered mental status.  EXAM: CT HEAD WITHOUT CONTRAST  TECHNIQUE: Contiguous axial images were obtained from the base of the skull through the vertex without intravenous contrast.  COMPARISON:  Multiple priors.  Most recent MR 07/28/2014.  FINDINGS: Surgical encephalomalacia RIGHT parietal lobe. Post treatment changes with hypoattenuation of the cortex and white matter throughout the RIGHT hemisphere. Slight thickening of the posterior aspect to the resection cavity, may be slightly involuted compared with priors.  Slight hyperattenuation of a 5 mm area of medial LEFT occipital lobe gyrus as seen on 18 and 19, not clearly present on priors. There may be slight associated occipital vasogenic surrounding edema. Recurrent disease not excluded on this noncontrast scan.  Calvarium intact. No acute findings in the orbits, sinuses, or mastoids.  IMPRESSION: Stable to slightly improved changes throughout the RIGHT hemisphere, reflecting previous surgery  and radiation.  Possible new finding medial LEFT occipital lobe, slight hyperattenuation and possible associated vasogenic edema. Recurrent disease not excluded. Further evaluation recommended with MRI brain without and with contrast when the patient is stable.   Electronically Signed   By: Rolla Flatten M.D.   On: 11/05/2014 23:12   Mr Jeri Cos SA Contrast  11/06/2014   CLINICAL DATA:  Initial evaluation for altered mental status. History of prior right parietal brain tumor.  EXAM: MRI HEAD WITHOUT AND WITH CONTRAST  TECHNIQUE: Multiplanar, multiecho pulse sequences of the brain and surrounding structures were obtained  without and with intravenous contrast.  CONTRAST:  58mL MULTIHANCE GADOBENATE DIMEGLUMINE 529 MG/ML IV SOLN  COMPARISON:  Prior CT from 11/05/2014 as well as previous MRI from 07/28/2014.  FINDINGS: Study is moderately degraded by motion artifact.  The right parietal resection cavity is similar in size relative to previous study. Restricted diffusion at the superior aspect of the cavity continues to diminish. Layering hematocrit level within the resection cavity again noted. Irregular enhancement along the superior margin of the resection cavity is grossly stable. Additionally, 7 mm focus of nodular enhancement within the periventricular white matter adjacent to the dilated body of the right lateral ventricles also grossly similar (series 16, image 32).  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  Dural thickening with enhancement overlying the right cerebral convexity is similar. Extensive T2 changes throughout the right hemisphere are similar to previous study as well.  Ex vacuo dilatation of the right lateral ventricle again noted, stable. Slight left-to-right midline shift also unchanged. Mild periventricular white matter changes on the left are similar.  Normal intravascular flow voids are maintained.  No acute infarct.  There is a new heterogeneous [place]  enhancing lesion within the medial left occipital lobe measuring 11 x 9 x 11 mm (AP by transverse by craniocaudad). There is localized vasogenic edema without significant mass effect.  Craniocervical junction within normal limits. Pituitary gland normal.  No acute abnormality about the orbits.  Mild mucosal thickening present within the max O sinuses and ethmoidal air cells.  Mastoid air cells are clear.  Inner ear structures normal.  IMPRESSION: 1. New 11 x 9 x 11 mm rim enhancing lesion within the medial left occipital lobe with associated localized vasogenic edema. Finding is worrisome for possible recurrent disease, although this would be somewhat unusual to reoccur in the contralateral cerebral hemisphere for a tumor that usually demonstrates local recurrence. Metastatic disease is also in the differential, although no other new lesions are identified. There is a 7 mm focus of nodular enhancement adjacent to the dilated body of the right lateral ventricle which is similar to previous exam. 2. Grossly stable postoperative changes within the right parietal lobe with persistent hematocrit level and some enhancement along the superior margin of the resection cavity. 3. Remote right MCA territory infarcts surrounding the resection cavity with associated laminar necrosis.   Electronically Signed   By: Jeannine Boga M.D.   On: 11/06/2014 06:52   US Renal  11/06/2014   CLINICAL DATA:  Acute kidney injury.  EXAM: RENAL / URINARY TRACT ULTRASOUND COMPLETE  COMPARISON:  None.  FINDINGS: Right Kidney:  Length: 8.0 cm. Increased parenchymal echogenicity. No mass or hydronephrosis visualized. Technically limited evaluation.  Left Kidney:  Length: 7.7 cm. Increased parenchymal echogenicity. No mass or hydronephrosis visualized. Technically limited evaluation.  Bladder:  Appears normal for degree of bladder distention.  IMPRESSION: Increased renal echogenicity consistent with chronic medical renal disease. No  obstructive uropathy. Technically limited evaluation of the renal parenchyma for fine detail.   Electronically Signed   By: Jeb Levering M.D.   On: 11/06/2014 06:44   Dg Chest Port 1 View  11/06/2014   CLINICAL DATA:  Altered mental status.  EXAM: PORTABLE CHEST - 1 VIEW  COMPARISON:  07/05/2013  FINDINGS: The heart is at the upper limits of normal in size. Pulmonary vasculature is normal. No consolidation, pleural effusion, or pneumothorax. No acute osseous abnormalities are seen. Unchanged scoliotic curvature of the spine.  IMPRESSION: No acute pulmonary process.  Electronically Signed   By: Jeb Levering M.D.   On: 11/06/2014 00:04    Medications:  Scheduled: . antiseptic oral rinse  7 mL Mouth Rinse q12n4p  . ARIPiprazole  7 mg Oral Daily  . cefTRIAXone (ROCEPHIN)  IV  1 g Intravenous Q24H  . chlorhexidine  15 mL Mouth Rinse BID  . dexamethasone  4 mg Intravenous 3 times per day  . heparin  5,000 Units Subcutaneous 3 times per day  . hydrocortisone  1 application Rectal BID  . lacosamide  50 mg Oral BID  . lamoTRIgine  100 mg Oral Daily  . levETIRAcetam  500 mg Intravenous Q12H  . pantoprazole (PROTONIX) IV  40 mg Intravenous Q24H   Etta Quill PA-C [facility] [phone]  11/07/2014, 9:43 AM  Patient seen and examined.  Clinical course and management discussed.  Necessary edits performed.  I agree with the above.  Assessment and plan of care developed and discussed below.     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  Admitted with altered mental status.  Currently on Rocephin for UTI.  MRI brain personally reviewed and shows a new enhancing lesion in left occipital lobe.with localized edema.  Dexamethasone initiated.   EEG shows no seizure activity.  Recommendations: 1.  Agree with continued search for primary lesion   Alexis Goodell, MD [facility] [phone]  11/07/2014  11:32  AM

## 2014-11-07 NOTE — Evaluation (Signed)
Physical Therapy Evaluation and Discharge Patient Details Name: [name] MRN: [id number] DOB: 27-Aug-1960 Today's Date: 11/07/2014   History of Present Illness  Pt is a 54 y/o female with a PMH of astrocytoma of the brain (s/p excision and radiation therapy 2011), seizures, SDH and stroke with L-sided muscle spasm, and CKD-III. Pt presents with worsening mental status and jerking. Per H&P, at baseline she could say "yes or no" to questioning, but since saturday PTA she could not interact with staff as she normally did. She was found to have a new lesion within the medial L occipital lobe.   Clinical Impression  Patient evaluated by Physical Therapy with no further acute PT needs identified. All education has been completed and the patient has no further questions. At the time of PT eval pt was following minimal commands and requiring +2 total assist for all mobility. This therapist called [facility] and spoke with the RN that has been caring for her. Per nursing, the pt was requiring the use of a lift for OOB, assist for feeding and total assist for ADL's. It appears that pt is at baseline of function. Recommending that pt return to SNF for continued care. See below for any follow-up Physial Therapy or equipment needs. PT is signing off. Thank you for this referral.     Follow Up Recommendations SNF;Supervision/Assistance - 24 hour    Equipment Recommendations  None recommended by PT (TBD by next venue of care)    Recommendations for Other Services       Precautions / Restrictions Precautions Precautions: Fall Restrictions Weight Bearing Restrictions: No      Mobility  Bed Mobility Overal bed mobility: Needs Assistance;+2 for physical assistance Bed Mobility: Rolling Rolling: Total assist         General bed mobility comments: Pt requires total assist for all aspects of bed mobility. +2 was utilized for scooting up in bed and positioning.   Transfers                     Ambulation/Gait                Stairs            Wheelchair Mobility    Modified Rankin (Stroke Patients Only)       Balance Overall balance assessment:  (NT)                                           Pertinent Vitals/Pain Pain Assessment: Faces Faces Pain Scale: No hurt    Home Living Family/patient expects to be discharged to:: Skilled nursing facility                      Prior Function Level of Independence: Needs assistance               Hand Dominance        Extremity/Trunk Assessment   Upper Extremity Assessment: RUE deficits/detail;LUE deficits/detail RUE Deficits / Details: Decreased strength and AROM - weakness? Was able to perform active shoulder flexion with limited AROM.      LUE Deficits / Details: Pt with hand possibly contracted in a fist with wrist flexion. No active movement noted.    Lower Extremity Assessment: RLE deficits/detail;LLE deficits/detail RLE Deficits / Details: Pt showed increased AROM with hip flexion/SLR but grossly limited and  weak. LLE Deficits / Details: Very minimal AROM noted. Was able to initiate quad contraction to raise foot up (inches) for sock to be donned.      Communication   Communication: Expressive difficulties;Receptive difficulties  Cognition Arousal/Alertness: Lethargic Behavior During Therapy: Flat affect Overall Cognitive Status: History of cognitive impairments - at baseline                      General Comments General comments (skin integrity, edema, etc.): Pt kept eyes closed during session. Did not answer questions appropriately and was fixated on dogs and cats. Was able to tell me her first name.    Exercises        Assessment/Plan    PT Assessment Patient needs continued PT services  PT Diagnosis Generalized weakness;Hemiplegia dominant side   PT Problem List Decreased strength;Decreased range of motion;Decreased activity  tolerance;Decreased balance;Decreased mobility;Decreased knowledge of use of DME;Decreased safety awareness;Decreased knowledge of precautions;Decreased cognition  PT Treatment Interventions DME instruction;Functional mobility training;Therapeutic activities;Therapeutic exercise;Neuromuscular re-education;Patient/family education;Wheelchair mobility training   PT Goals (Current goals can be found in the Care Plan section) Acute Rehab PT Goals PT Goal Formulation: All assessment and education complete, DC therapy    Frequency Min 2X/week   Barriers to discharge        Co-evaluation               End of Session   Activity Tolerance: Other (comment) (Limited by cognition/rehab effort) Patient left: in bed;with bed alarm set;with call bell/phone within reach Nurse Communication: Mobility status;Need for lift equipment         Time: 1040-1053 PT Time Calculation (min) (ACUTE ONLY): 13 min   Charges:   PT Evaluation $Initial PT Evaluation Tier I: 1 Procedure     PT G Codes:        Rolinda Roan 2014-11-28, 1:16 PM   Rolinda Roan, PT, DPT Acute Rehabilitation Services Pager: [phone]

## 2014-11-07 NOTE — Clinical Documentation Improvement (Signed)
"  MRI reviewed and shows new heterogeneous enhancing lesion in left occipital lobe 11X9X11 mm with localized edema without mass effect."  "Will start patient on Dexamethasone loading dose of 10 mg followed by 4 mg Q8 hours." is documented in a progress note by Etta Quill, PA-C 11/05/13.  "MRI brain personally reviewed and shows a new enhancing lesion in left occipital lobe.with localized edema. Dexamethasone initiated. EEG shows no seizure activity" is documented by Dr. Doy Mince in a progress note dated 11/07/14.  Please document if a condition below provides greater specificity regarding the "localized edema without mass effect":   - Vasogenic Edema   - Cerebral Edema  - Other Condition  - Unable to clinically determine  Thank You, Erling Conte ,RN Clinical Documentation Specialist:  [phone] [place] [employer]

## 2014-11-08 ENCOUNTER — Inpatient Hospital Stay (HOSPITAL_COMMUNITY): Payer: Medicare Other

## 2014-11-08 LAB — BASIC METABOLIC PANEL
Anion gap: 7 (ref 5–15)
BUN: 28 mg/dL — ABNORMAL HIGH (ref 6–20)
CHLORIDE: 117 mmol/L — AB (ref 101–111)
CO2: 19 mmol/L — ABNORMAL LOW (ref 22–32)
CREATININE: 1.18 mg/dL — AB (ref 0.44–1.00)
Calcium: 10.4 mg/dL — ABNORMAL HIGH (ref 8.9–10.3)
GFR calc Af Amer: 59 mL/min — ABNORMAL LOW (ref >60)
GFR, EST NON AFRICAN AMERICAN: 51 mL/min — AB (ref >60)
GLUCOSE: 117 mg/dL — AB (ref 65–99)
Potassium: 3.9 mmol/L (ref 3.5–5.1)
SODIUM: 143 mmol/L (ref 135–145)

## 2014-11-08 LAB — URINE CULTURE

## 2014-11-08 LAB — CBC WITH DIFFERENTIAL/PLATELET
BASOS ABS: 0 10*3/uL (ref 0.0–0.1)
Basophils Relative: 0 % (ref 0–1)
EOS ABS: 0 10*3/uL (ref 0.0–0.7)
EOS PCT: 0 % (ref 0–5)
HCT: 29.3 % — ABNORMAL LOW (ref 36.0–46.0)
Hemoglobin: 9.2 g/dL — ABNORMAL LOW (ref 12.0–15.0)
Lymphocytes Relative: 18 % (ref 12–46)
Lymphs Abs: 0.8 10*3/uL (ref 0.7–4.0)
MCH: 25.4 pg — AB (ref 26.0–34.0)
MCHC: 31.4 g/dL (ref 30.0–36.0)
MCV: 80.9 fL (ref 78.0–100.0)
MONOS PCT: 3 % (ref 3–12)
Monocytes Absolute: 0.1 10*3/uL (ref 0.1–1.0)
NEUTROS ABS: 3.3 10*3/uL (ref 1.7–7.7)
Neutrophils Relative %: 79 % — ABNORMAL HIGH (ref 43–77)
Platelets: 124 10*3/uL — ABNORMAL LOW (ref 150–400)
RBC: 3.62 MIL/uL — AB (ref 3.87–5.11)
RDW: 14.8 % (ref 11.5–15.5)
WBC: 4.2 10*3/uL (ref 4.0–10.5)

## 2014-11-08 LAB — RETICULOCYTES
RBC.: 3.59 MIL/uL — AB (ref 3.87–5.11)
RETIC COUNT ABSOLUTE: 43.1 10*3/uL (ref 19.0–186.0)
RETIC CT PCT: 1.2 % (ref 0.4–3.1)

## 2014-11-08 LAB — GLUCOSE, CAPILLARY
GLUCOSE-CAPILLARY: 105 mg/dL — AB (ref 65–99)
Glucose-Capillary: 103 mg/dL — ABNORMAL HIGH (ref 65–99)
Glucose-Capillary: 115 mg/dL — ABNORMAL HIGH (ref 65–99)
Glucose-Capillary: 117 mg/dL — ABNORMAL HIGH (ref 65–99)

## 2014-11-08 LAB — LAMOTRIGINE LEVEL: Lamotrigine Lvl: 6 ug/mL (ref 2.0–20.0)

## 2014-11-08 LAB — FERRITIN: Ferritin: 29 ng/mL (ref 11–307)

## 2014-11-08 LAB — IRON AND TIBC
Iron: 17 ug/dL — ABNORMAL LOW (ref 28–170)
Saturation Ratios: 6 % — ABNORMAL LOW (ref 10.4–31.8)
TIBC: 291 ug/dL (ref 250–450)
UIBC: 274 ug/dL

## 2014-11-08 LAB — LIPID PANEL
Cholesterol: 125 mg/dL (ref 0–200)
HDL: 54 mg/dL (ref >40)
LDL Cholesterol: 63 mg/dL (ref 0–99)
TRIGLYCERIDES: 40 mg/dL (ref <150)
Total CHOL/HDL Ratio: 2.3 RATIO
VLDL: 8 mg/dL (ref 0–40)

## 2014-11-08 LAB — ANTI-RIBONUCLEIC ACID ANTIBODY: Sm/rnp: <1

## 2014-11-08 LAB — LACTATE DEHYDROGENASE: LDH: 199 U/L — AB (ref 98–192)

## 2014-11-08 LAB — VITAMIN B12: Vitamin B-12: 734 pg/mL (ref 180–914)

## 2014-11-08 LAB — MAGNESIUM: Magnesium: 1.8 mg/dL (ref 1.7–2.4)

## 2014-11-08 LAB — FOLATE: Folate: 9.6 ng/mL (ref >5.9)

## 2014-11-08 LAB — C-REACTIVE PROTEIN: CRP: 2 mg/dL — ABNORMAL HIGH (ref <1.0)

## 2014-11-08 LAB — SEDIMENTATION RATE: Sed Rate: 13 mm/hr (ref 0–22)

## 2014-11-08 MED ORDER — IOHEXOL 300 MG/ML  SOLN
25.0000 mL | INTRAMUSCULAR | Status: AC
  Administered 2014-11-08 (×2): 25 mL via ORAL

## 2014-11-08 MED ORDER — IOHEXOL 300 MG/ML  SOLN
80.0000 mL | Freq: Once | INTRAMUSCULAR | Status: AC | PRN
  Administered 2014-11-08: 65 mL via INTRAVENOUS

## 2014-11-08 NOTE — Progress Notes (Signed)
Pts husband Shalae Belmonte would like updates from pts MD. Dr Thereasa Solo made aware and given husbands number [phone].

## 2014-11-08 NOTE — Progress Notes (Signed)
[place] TEAM 1 - Stepdown/ICU TEAM Progress Note  [name] OFB:[id number] DOB: [birth year] DOA: 11/05/2014 PCP: [name], DO  Admit HPI / Brief Narrative: 54 y.o. female with hx of Hyperlipidemia, GERD, depression, anxiety, L parietal astrocytoma of brain (s/p excision and radiation therapy 2011), seizure, SDH, CVA, chronic kidney disease III, and DVT who presented with worsening mental status and jerking.  She was brought in via EMS from her nursing home due to worsening mental status. At baseline she could answer "yes or no" to questioning.  Acutely she could not interact with staff as she normally did. .   In ED patient was found to have WBC 4.3, temperature 99.7, no tachycardia, INR 1.2, PTT 29, positive urinalysis, troponin negative, AoCKD-III.   HPI/Subjective: The patient is significantly more alert today than she was when I saw her 48 hours ago.  She will open her eyes to voice.  When she attempts to speak however she does not answer the questions that I pose to her and her sentence structure is garbled and nonsensical.  She is therefore not able to provide a reliable review of systems.  Assessment/Plan:  Acute encephalopathy Likely toxic metabolic encephalopathy related to Escherichia coli pyelonephritis +/- contribution from brain mass - appears to be improving with treatment for acute infection - LP does not appear to be clearly indicated therefore I have discussed this with neurology and we have agreed to discontinue plans for LP  New 11 x 9 x 11 mm rim enhancing lesion within the medial left occipital lobe w/ vasogenic edema - Hx of astrocytoma With improvement in renal function we will begin a search for primary which would hopefully be more amenable to biopsy - CT scan chest abdomen and pelvis today - continue Decadron per neurology  E coli UTI Continue focused antibiotics therapy - clinically the patient appears to be slowly improving  Seizure Continue seizure  medications as per Neurology   HLD  History of stroke  Acute on CKD stage 3 Renal function is improving w/ volume resuscitation - continue to follow trend  Pancytopenia Resolving spontaneously - likely due to gram-negative serious infection  GERD  Depression  Code Status: FULL Family Communication: Spoke with father/POA at bedside again today Disposition Plan: SDU  Consultants: Neurology  Procedures: None  Antibiotics: Rocephin 6/6 >  DVT prophylaxis: SCDs  Objective: Blood pressure 144/80, pulse 65, temperature 98.2 F (36.8 C), temperature source Oral, resp. rate 16, height 5\' 9"  (1.753 m), weight 66.1 kg (145 lb 11.6 oz), last menstrual period 08/26/2010, SpO2 97 %.  Intake/Output Summary (Last 24 hours) at 11/08/14 0821 Last data filed at 11/08/14 0800  Gross per 24 hour  Intake   2305 ml  Output      0 ml  Net   2305 ml   Exam: General: No acute respiratory distress - awake but confused/delirious Lungs: Clear to auscultation bilaterally without wheezes or crackles Cardiovascular: Regular rate and rhythm without murmur gallop or rub normal S1 and S2 Abdomen: Nontender, nondistended, soft, bowel sounds positive, no rebound, no ascites, no appreciable mass Extremities: No significant cyanosis, clubbing, or edema bilateral lower extremities  Data Reviewed: Basic Metabolic Panel:  Recent Labs Lab 11/05/14 2212 11/05/14 2221 11/06/14 0729 11/07/14 0217 11/08/14 0240  NA 141 144 144 143  --   K 3.5 3.5 3.5 3.9  --   CL 112* 112* 117* 113*  --   CO2 19*  --  20* 21*  --  GLUCOSE 104* 101* 96 122*  --   BUN 28* 29* 26* 22*  --   CREATININE 1.73* 1.80* 1.51* 1.24*  --   CALCIUM 10.1  --  9.7 9.9  --   MG  --   --   --   --  1.8    CBC:  Recent Labs Lab 11/05/14 2212 11/05/14 2221 11/06/14 0729 11/07/14 0217 11/08/14 0240  WBC 4.3  --  3.3* 2.7* 4.2  NEUTROABS 2.8  --   --   --  3.3  HGB 10.5* 10.2* 9.3* 10.1* 9.2*  HCT 32.7* 30.0* 29.3*  32.2* 29.3*  MCV 80.5  --  82.3 82.1 80.9  PLT 108*  --  98* 111* 124*    Liver Function Tests:  Recent Labs Lab 11/05/14 2212 11/06/14 0729 11/07/14 0217  AST 32 34 59*  ALT 24 25 43  ALKPHOS 80 65 75  BILITOT 0.8 0.7 0.6  PROT 5.9* 5.4* 6.1*  ALBUMIN 3.4* 3.1* 3.2*    Coags:  Recent Labs Lab 11/05/14 2212  INR 1.20    Recent Labs Lab 11/05/14 2212  APTT 29   CBG:  Recent Labs Lab 11/07/14 0014 11/07/14 0811 11/07/14 1558 11/08/14 0008 11/08/14 0352  GLUCAP 113* 116* 103* 117* 115*    Studies:   Recent x-ray studies have been reviewed in detail by the Attending Physician  Scheduled Meds:  Scheduled Meds: . antiseptic oral rinse  7 mL Mouth Rinse q12n4p  . ARIPiprazole  7 mg Oral Daily  . cefTRIAXone (ROCEPHIN)  IV  1 g Intravenous Q24H  . chlorhexidine  15 mL Mouth Rinse BID  . dexamethasone  4 mg Intravenous 3 times per day  . hydrocortisone  1 application Rectal BID  . lacosamide  50 mg Oral BID  . lamoTRIgine  100 mg Oral Daily  . levETIRAcetam  500 mg Intravenous Q12H  . pantoprazole (PROTONIX) IV  40 mg Intravenous Q24H    Time spent on care of this patient: 35 mins   [name] , MD   [facility] Office  [phone] Pager - Text Page per Shea Evans as per below:  On-Call/Text Page:      [email or web address]      password [email or web address]  If 7PM-7AM, please contact night-coverage [email or web address] Password [email or web address] 11/08/2014, 8:21 AM   LOS: 2 days

## 2014-11-08 NOTE — Progress Notes (Signed)
Subjective: Patient continues to remain altered. She repeat "things are changing color and cats are here". Does not follow commands but will open eyes to her name being called.   Objective: Current vital signs: BP 143/79 mmHg  Pulse 67  Temp(Src) 98.2 F (36.8 C) (Oral)  Resp 16  Ht 5\' 9"  (1.753 m)  Wt 66.1 kg (145 lb 11.6 oz)  BMI 21.51 kg/m2  SpO2 97%  LMP 08/26/2010 Vital signs in last 24 hours: Temp:  [97.6 F (36.4 C)-98.3 F (36.8 C)] 98.2 F (36.8 C) (06/08 0806) Pulse Rate:  [65-88] 67 (06/08 0900) Resp:  [10-17] 16 (06/08 0900) BP: (116-144)/(61-87) 143/79 mmHg (06/08 0900) SpO2:  [97 %-100 %] 97 % (06/08 0900)  Intake/Output from previous day: 06/07 0701 - 06/08 0700 In: 2305 [I.V.:2000; IV Piggyback:305] Out: -  Intake/Output this shift: Total I/O In: 200 [I.V.:200] Out: -  Nutritional status: Diet NPO time specified Except for: Sips with Meds  Neurologic Exam:  Mental Status: Drowsy, repeat "things keep changing color" and "cats ar in the room".  Will not follow verbal commands.   Cranial Nerves: II: No blink to threat bilaterally, pupils equal, round, reactive to light and accommodation III,IV, VI: keeps eyes closed, when eyelids held open will look toward voice on either side, corneal reflex intact.  V,VII: left facial droop, facial light touch sensation normal bilaterally VIII: hearing normal bilaterally IX,X: uvula rises symmetrically XI: bilateral shoulder shrug XII: midline tongue extension without atrophy or fasciculations  Motor: Right : Upper extremity   4/5    Left:     Upper extremity   0/5 held in flexion contracture  Lower extremity   3/5     Lower extremity   2/5 flexes knee to pain Right arm and leg tremor noted but will stop when I place my hand on arm or leg.  Sensory: Pinprick and light touch intact throughout, bilaterally Deep Tendon Reflexes:  1+ right bicep and brachioradialis. 2+ in left bicep and brachioradialis. No KJ or AJ.    Plantars: Mute bilaterally    Lab Results: Basic Metabolic Panel:  Recent Labs Lab 11/05/14 2212 11/05/14 2221 11/06/14 0729 11/07/14 0217 11/08/14 0240  NA 141 144 144 143  --   K 3.5 3.5 3.5 3.9  --   CL 112* 112* 117* 113*  --   CO2 19*  --  20* 21*  --   GLUCOSE 104* 101* 96 122*  --   BUN 28* 29* 26* 22*  --   CREATININE 1.73* 1.80* 1.51* 1.24*  --   CALCIUM 10.1  --  9.7 9.9  --   MG  --   --   --   --  1.8    Liver Function Tests:  Recent Labs Lab 11/05/14 2212 11/06/14 0729 11/07/14 0217  AST 32 34 59*  ALT 24 25 43  ALKPHOS 80 65 75  BILITOT 0.8 0.7 0.6  PROT 5.9* 5.4* 6.1*  ALBUMIN 3.4* 3.1* 3.2*   No results for input(s): LIPASE, AMYLASE in the last 168 hours. No results for input(s): AMMONIA in the last 168 hours.  CBC:  Recent Labs Lab 11/05/14 2212 11/05/14 2221 11/06/14 0729 11/07/14 0217 11/08/14 0240  WBC 4.3  --  3.3* 2.7* 4.2  NEUTROABS 2.8  --   --   --  3.3  HGB 10.5* 10.2* 9.3* 10.1* 9.2*  HCT 32.7* 30.0* 29.3* 32.2* 29.3*  MCV 80.5  --  82.3 82.1 80.9  PLT 108*  --  98* 111* 124*    Cardiac Enzymes: No results for input(s): CKTOTAL, CKMB, CKMBINDEX, TROPONINI in the last 168 hours.  Lipid Panel:  Recent Labs Lab 11/08/14 0240  CHOL 125  TRIG 40  HDL 54  CHOLHDL 2.3  VLDL 8  LDLCALC 63    CBG:  Recent Labs Lab 11/07/14 0811 11/07/14 1558 11/08/14 0008 11/08/14 0352 11/08/14 0804  GLUCAP 116* 103* 117* 115* 105*    Microbiology: Results for orders placed or performed during the hospital encounter of 11/05/14  Culture, blood (x 2)     Status: None (Preliminary result)   Collection Time: 11/06/14  4:35 AM  Result Value Ref Range Status   Specimen Description BLOOD LEFT ARM  Final   Special Requests BAA 5ML  Final   Culture   Final           BLOOD CULTURE RECEIVED NO GROWTH TO DATE CULTURE WILL BE HELD FOR 5 DAYS BEFORE ISSUING A FINAL NEGATIVE REPORT Performed at [facility]    Report  Status PENDING  Incomplete  Culture, blood (x 2)     Status: None (Preliminary result)   Collection Time: 11/06/14  7:30 AM  Result Value Ref Range Status   Specimen Description BLOOD LEFT ASSIST CONTROL  Final   Special Requests BAA 10ML  Final   Culture   Final           BLOOD CULTURE RECEIVED NO GROWTH TO DATE CULTURE WILL BE HELD FOR 5 DAYS BEFORE ISSUING A FINAL NEGATIVE REPORT Performed at [facility]    Report Status PENDING  Incomplete  Urine culture     Status: None   Collection Time: 11/06/14  7:50 AM  Result Value Ref Range Status   Specimen Description URINE, CLEAN CATCH  Final   Special Requests NONE  Final   Colony Count   Final    >=100,000 COLONIES/ML Performed at [facility]    Culture   Final    ESCHERICHIA COLI Performed at [facility]    Report Status 11/08/2014 FINAL  Final   Organism ID, Bacteria ESCHERICHIA COLI  Final      Susceptibility   Escherichia coli - MIC*    AMPICILLIN >=32 RESISTANT Resistant     CEFAZOLIN <=4 SENSITIVE Sensitive     CEFTRIAXONE <=1 SENSITIVE Sensitive     CIPROFLOXACIN <=0.25 SENSITIVE Sensitive     GENTAMICIN <=1 SENSITIVE Sensitive     LEVOFLOXACIN <=0.12 SENSITIVE Sensitive     NITROFURANTOIN <=16 SENSITIVE Sensitive     TOBRAMYCIN <=1 SENSITIVE Sensitive     TRIMETH/SULFA <=20 SENSITIVE Sensitive     PIP/TAZO <=4 SENSITIVE Sensitive     * ESCHERICHIA COLI  MRSA PCR Screening     Status: None   Collection Time: 11/06/14  3:45 PM  Result Value Ref Range Status   MRSA by PCR NEGATIVE NEGATIVE Final    Comment:        The GeneXpert MRSA Assay (FDA approved for NASAL specimens only), is one component of a comprehensive MRSA colonization surveillance program. It is not intended to diagnose MRSA infection nor to guide or monitor treatment for MRSA infections.     Coagulation Studies:  Recent Labs  11/05/14 2212  LABPROT 15.3*  INR 1.20    Imaging: No results  found.  Medications:  Scheduled: . antiseptic oral rinse  7 mL Mouth Rinse q12n4p  . ARIPiprazole  7 mg Oral Daily  . cefTRIAXone (ROCEPHIN)  IV  1 g Intravenous Q24H  . chlorhexidine  15 mL Mouth Rinse BID  . dexamethasone  4 mg Intravenous 3 times per day  . hydrocortisone  1 application Rectal BID  . lacosamide  50 mg Oral BID  . lamoTRIgine  100 mg Oral Daily  . levETIRAcetam  500 mg Intravenous Q12H  . pantoprazole (PROTONIX) IV  40 mg Intravenous Q24H   Etta Quill PA-C [facility] [phone]  11/08/2014, 9:49 AM  Patient seen and examined.  Clinical course and management discussed.  Necessary edits performed.  I agree with the above.  Assessment and plan of care developed and discussed below.     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 Admitted with altered mental status. Currently on Rocephin for UTI. MRI shows a new enhancing lesion in left occipital lobe.  Primary team planning to get CT chest/abdomen and pelvis once Cr improves.   Recommendations: 1.   Will continue to follow with you   Alexis Goodell, MD [facility] [phone]  11/08/2014  10:15 AM

## 2014-11-08 NOTE — Progress Notes (Signed)
Pt transferred to 2C09, pt is alert with stimulation. Pt followed command when asked to stick out tongue but no other commands were followed. No family members present at this time. Will monitor.

## 2014-11-09 ENCOUNTER — Inpatient Hospital Stay (HOSPITAL_COMMUNITY): Payer: Medicare Other

## 2014-11-09 DIAGNOSIS — C7931 Secondary malignant neoplasm of brain: Secondary | ICD-10-CM

## 2014-11-09 DIAGNOSIS — R4182 Altered mental status, unspecified: Secondary | ICD-10-CM | POA: Diagnosis present

## 2014-11-09 DIAGNOSIS — R400 Somnolence: Secondary | ICD-10-CM

## 2014-11-09 LAB — GLUCOSE, CAPILLARY
GLUCOSE-CAPILLARY: 111 mg/dL — AB (ref 65–99)
GLUCOSE-CAPILLARY: 119 mg/dL — AB (ref 65–99)
Glucose-Capillary: 122 mg/dL — ABNORMAL HIGH (ref 65–99)
Glucose-Capillary: 103 mg/dL — ABNORMAL HIGH (ref 65–99)

## 2014-11-09 LAB — CBC
HEMATOCRIT: 27.5 % — AB (ref 36.0–46.0)
HEMOGLOBIN: 8.8 g/dL — AB (ref 12.0–15.0)
MCH: 26.3 pg (ref 26.0–34.0)
MCHC: 32 g/dL (ref 30.0–36.0)
MCV: 82.1 fL (ref 78.0–100.0)
Platelets: 115 10*3/uL — ABNORMAL LOW (ref 150–400)
RBC: 3.35 MIL/uL — AB (ref 3.87–5.11)
RDW: 15.3 % (ref 11.5–15.5)
WBC: 5 10*3/uL (ref 4.0–10.5)

## 2014-11-09 LAB — BASIC METABOLIC PANEL
Anion gap: 10 (ref 5–15)
BUN: 27 mg/dL — ABNORMAL HIGH (ref 6–20)
CHLORIDE: 115 mmol/L — AB (ref 101–111)
CO2: 15 mmol/L — AB (ref 22–32)
Calcium: 9.7 mg/dL (ref 8.9–10.3)
Creatinine, Ser: 1.18 mg/dL — ABNORMAL HIGH (ref 0.44–1.00)
GFR, EST AFRICAN AMERICAN: 59 mL/min — AB (ref >60)
GFR, EST NON AFRICAN AMERICAN: 51 mL/min — AB (ref >60)
GLUCOSE: 109 mg/dL — AB (ref 65–99)
Potassium: 3.7 mmol/L (ref 3.5–5.1)
Sodium: 140 mmol/L (ref 135–145)

## 2014-11-09 LAB — HAPTOGLOBIN: Haptoglobin: 147 mg/dL (ref 34–200)

## 2014-11-09 LAB — LEVETIRACETAM LEVEL: LEVETIRACETAM: 43.5 ug/mL — AB (ref 10.0–40.0)

## 2014-11-09 NOTE — Progress Notes (Signed)
[place] TEAM 1 - Stepdown/ICU TEAM Progress Note  [name] MQK:[id number] DOB: 22-Dec-1960 DOA: 11/05/2014 PCP: [name], DO  Admit HPI / Brief Narrative: 54 y.o. 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  Presents with worsening mental status and jerking. She was brought in via EMS from her nursing home due to worsening mental status. At baseline, she could say ' yes or no" to questioning. But since Saturday, she could not interact with staff as she normally did. She seems to be more confused.   In ED, patient was found to have WBC 4.3, temperature 99.7, no tachycardia, INR 1.2, PTT 29, positive urinalysis, troponin negative, AoCKD-III. Patient is admitted to inpatient for further evaluation and treatment. Neurology was consulted.   HPI/Subjective: 6/9 patient with eyes closed did not follow commands, did not attempt to answer questions.   Assessment/Plan: Acute encephalopathy -Likely toxic metabolic encephalopathy related to Escherichia coli pyelonephritis +/- contribution from brain mass; may have waxing and waning course as patient appeared worse today than reported from yesterday note.  - LP does not appear to be clearly indicated; Dr. Thereasa Solo discussed this with neurology and a agreed to discontinue plans for LP -Patient continues to have poor improvement will need to discuss LTAC vs SNF  New 11 x 9 x 11 mm rim enhancing lesion within the medial left occipital lobe/ Hx of astrocytoma -Dexamethasone per neuro hospitalist -Consult neurosurgery in the a.m  Left spastic hemiparesis -Secondary to initial astrocytoma.  UTI -Continue Rocephin and complete 5 day course of antibiotics.  Seizure -EEG not consistent with seizure -Continue Vimpat 50 mg BID; -Continue Lamictal 100 mg daily; 6/7 within therapeutic  level  -Continue Keppra 500 mg BID; 6/7 level = 43.5 high   HLD -Lipid panel within NCEP guidelines  History of stroke  Acute on CKD stage 3 -Improving -Continue normal saline 136m/hr  GERD  Depression -Continue Abilify 7 mg daily  Pancytopenia -Fairly recent, anemia panel pending, LDH, haptoglobin, TNA, RNP, ESR, CRP -Resolving spontaneously; likely due to gram-negative serious infection   Code Status: FULL Family Communication: no family present at time of exam Disposition Plan: per neurology   Consultants: DBarry Dienes(neuro hospitalist)  Procedure/Significant Events: 6/6 EEG;-Abnormal EEG due to generalized slowing indicating a mild to moderate cerebral disturbance (encephalopathy). Intermittent focal slowing is noted in the left posterior region indicating a potential seizure foci. 6/6 MRI brain with and without contrast;-New 11 x 9 x 11 mm rim enhancing lesion within the medial left occipital lobe with associated localized vasogenic edema.  -7 mm focus of nodular enhancement adjacent to the dilated body of the right lateral ventricle which is similar to previous exam. -Remote right MCA territory infarcts  6/9 EEG;abnormal EEG secondary to general background slowing  Culture   Antibiotics: Rocephin 6/6>>  DVT prophylaxis: SCD   Devices    LINES / TUBES:      Continuous Infusions: . sodium chloride 100 mL/hr at 11/08/14 1410    Objective: VITAL SIGNS: Temp: 98.7 F (37.1 C) (06/09 1700) Temp Source: Axillary (06/09 1700) BP: 141/72 mmHg (06/09 1700) Pulse Rate: 67 (06/09 1800) SPO2; FIO2:   Intake/Output Summary (Last 24 hours) at 11/09/14 2003 Last data filed at 11/09/14 0611  Gross per 24 hour  Intake 1018.33 ml  Output      0 ml  Net 1018.33 ml  Exam: General:patient with eyes closed, does not follow commands, does not communicate verbally,No acute respiratory distress Eyes: , negative retinal hemorrhage ENT:  Negative Runny nose, negative gingival bleeding, Neck:  Negative scars, masses, torticollis, lymphadenopathy, JVD Lungs: Clear to auscultation bilaterally without wheezes or crackles Cardiovascular: Regular rate and rhythm without murmur gallop or rub normal S1 and S2 Abdomen:negative abdominal pain, negative dysphagia, Nontender, nondistended, soft, bowel sounds positive, no rebound, no ascites, no appreciable mass, PEG tube in place negative sign of infection around site Extremities: No significant cyanosis, clubbing, or edema bilateral lower extremities Neurologic:  Unable to assess as patient could not follow commands or participate in exam.    Data Reviewed: Basic Metabolic Panel:  Recent Labs Lab 11/05/14 2212 11/05/14 2221 11/06/14 0729 11/07/14 0217 11/08/14 0240 11/08/14 1140 11/09/14 0247  NA 141 144 144 143  --  143 140  K 3.5 3.5 3.5 3.9  --  3.9 3.7  CL 112* 112* 117* 113*  --  117* 115*  CO2 19*  --  20* 21*  --  19* 15*  GLUCOSE 104* 101* 96 122*  --  117* 109*  BUN 28* 29* 26* 22*  --  28* 27*  CREATININE 1.73* 1.80* 1.51* 1.24*  --  1.18* 1.18*  CALCIUM 10.1  --  9.7 9.9  --  10.4* 9.7  MG  --   --   --   --  1.8  --   --    Liver Function Tests:  Recent Labs Lab 11/05/14 2212 11/06/14 0729 11/07/14 0217  AST 32 34 59*  ALT 24 25 43  ALKPHOS 80 65 75  BILITOT 0.8 0.7 0.6  PROT 5.9* 5.4* 6.1*  ALBUMIN 3.4* 3.1* 3.2*   No results for input(s): LIPASE, AMYLASE in the last 168 hours. No results for input(s): AMMONIA in the last 168 hours. CBC:  Recent Labs Lab 11/05/14 2212 11/05/14 2221 11/06/14 0729 11/07/14 0217 11/08/14 0240 11/09/14 0247  WBC 4.3  --  3.3* 2.7* 4.2 5.0  NEUTROABS 2.8  --   --   --  3.3  --   HGB 10.5* 10.2* 9.3* 10.1* 9.2* 8.8*  HCT 32.7* 30.0* 29.3* 32.2* 29.3* 27.5*  MCV 80.5  --  82.3 82.1 80.9 82.1  PLT 108*  --  98* 111* 124* 115*   Cardiac Enzymes: No results for input(s): CKTOTAL, CKMB, CKMBINDEX, TROPONINI in  the last 168 hours. BNP (last 3 results) No results for input(s): BNP in the last 8760 hours.  ProBNP (last 3 results) No results for input(s): PROBNP in the last 8760 hours.  CBG:  Recent Labs Lab 11/08/14 1644 11/09/14 0009 11/09/14 0435 11/09/14 0810 11/09/14 1820  GLUCAP 103* 111* 119* 122* 103*    Recent Results (from the past 240 hour(s))  Culture, blood (x 2)     Status: None (Preliminary result)   Collection Time: 11/06/14  4:35 AM  Result Value Ref Range Status   Specimen Description BLOOD LEFT ARM  Final   Special Requests BAA 5ML  Final   Culture   Final           BLOOD CULTURE RECEIVED NO GROWTH TO DATE CULTURE WILL BE HELD FOR 5 DAYS BEFORE ISSUING A FINAL NEGATIVE REPORT Performed at [facility]    Report Status PENDING  Incomplete  Culture, blood (x 2)     Status: None (Preliminary result)   Collection Time: 11/06/14  7:30 AM  Result Value Ref Range Status  Specimen Description BLOOD LEFT ASSIST CONTROL  Final   Special Requests BAA 10ML  Final   Culture   Final           BLOOD CULTURE RECEIVED NO GROWTH TO DATE CULTURE WILL BE HELD FOR 5 DAYS BEFORE ISSUING A FINAL NEGATIVE REPORT Performed at [facility]    Report Status PENDING  Incomplete  Urine culture     Status: None   Collection Time: 11/06/14  7:50 AM  Result Value Ref Range Status   Specimen Description URINE, CLEAN CATCH  Final   Special Requests NONE  Final   Colony Count   Final    >=100,000 COLONIES/ML Performed at [facility]    Culture   Final    ESCHERICHIA COLI Performed at [facility]    Report Status 11/08/2014 FINAL  Final   Organism ID, Bacteria ESCHERICHIA COLI  Final      Susceptibility   Escherichia coli - MIC*    AMPICILLIN >=32 RESISTANT Resistant     CEFAZOLIN <=4 SENSITIVE Sensitive     CEFTRIAXONE <=1 SENSITIVE Sensitive     CIPROFLOXACIN <=0.25 SENSITIVE Sensitive     GENTAMICIN <=1 SENSITIVE Sensitive     LEVOFLOXACIN  <=0.12 SENSITIVE Sensitive     NITROFURANTOIN <=16 SENSITIVE Sensitive     TOBRAMYCIN <=1 SENSITIVE Sensitive     TRIMETH/SULFA <=20 SENSITIVE Sensitive     PIP/TAZO <=4 SENSITIVE Sensitive     * ESCHERICHIA COLI  MRSA PCR Screening     Status: None   Collection Time: 11/06/14  3:45 PM  Result Value Ref Range Status   MRSA by PCR NEGATIVE NEGATIVE Final    Comment:        The GeneXpert MRSA Assay (FDA approved for NASAL specimens only), is one component of a comprehensive MRSA colonization surveillance program. It is not intended to diagnose MRSA infection nor to guide or monitor treatment for MRSA infections.      Studies:  Recent x-ray studies have been reviewed in detail by the Attending Physician  Scheduled Meds:  Scheduled Meds: . antiseptic oral rinse  7 mL Mouth Rinse q12n4p  . ARIPiprazole  7 mg Oral Daily  . cefTRIAXone (ROCEPHIN)  IV  1 g Intravenous Q24H  . chlorhexidine  15 mL Mouth Rinse BID  . dexamethasone  4 mg Intravenous 3 times per day  . hydrocortisone  1 application Rectal BID  . lacosamide  50 mg Oral BID  . lamoTRIgine  100 mg Oral Daily  . levETIRAcetam  500 mg Intravenous Q12H  . pantoprazole (PROTONIX) IV  40 mg Intravenous Q24H    Time spent on care of this patient: 40 mins   Jarmel Linhardt, Geraldo Docker , MD  [facility] Office  [phone] Pager - [phone]  On-Call/Text Page:      [email or web address]      password [email or web address]  If 7PM-7AM, please contact night-coverage [email or web address] Password [email or web address] 11/09/2014, 8:03 PM   LOS: 3 days   Care during the described time interval was provided by me .  I have reviewed this patient's available data, including medical history, events of note, physical examination, and all test results as part of my evaluation. I have personally reviewed and interpreted all radiology studies.   Dia Crawford, MD [phone] Pager

## 2014-11-09 NOTE — Procedures (Signed)
ELECTROENCEPHALOGRAM REPORT   Patient: [name]      Room #: K02 Age: 54 y.o.        Sex: female Referring Physician: Dr Blaine Hamper Report Date:  11/09/2014        Interpreting Physician: Hulen Luster  History: [name] is an 54 y.o. female hx of prior brain tumor, CVA and SAH presenting with altered mental status.   Medications:  Scheduled: . antiseptic oral rinse  7 mL Mouth Rinse q12n4p  . ARIPiprazole  7 mg Oral Daily  . cefTRIAXone (ROCEPHIN)  IV  1 g Intravenous Q24H  . chlorhexidine  15 mL Mouth Rinse BID  . dexamethasone  4 mg Intravenous 3 times per day  . hydrocortisone  1 application Rectal BID  . lacosamide  50 mg Oral BID  . lamoTRIgine  100 mg Oral Daily  . levETIRAcetam  500 mg Intravenous Q12H  . pantoprazole (PROTONIX) IV  40 mg Intravenous Q24H    Conditions of Recording:  This is a 16 channel EEG carried out with the patient in the drowsy state.  Description:  The waking background activity consists predominantly of a mixture of low voltage, symmetrical, fairly well organized, theta and delta activity, seen from the parieto-occipital and posterior temporal regions. A mixture of theta and alpha rhythms are seen from the central and temporal regions. No clear evolution is noted. No epileptiform activity is noted.   Normal sleep architecture is not observed. Intermittent photic stimulation and hyperventilation was not performed.    IMPRESSION: This is an abnormal EEG secondary to general background slowing. This finding may be seen with a diffuse disturbance that is etiologically nonspecific.  No epileptiform activity was noted.     Jim Like, DO [facility] [phone]  If 7pm- 7am, please page neurology on call as listed in AMION. 11/09/2014, 4:56 PM

## 2014-11-09 NOTE — Clinical Social Work Note (Signed)
Patient transferred to [facility]. CSW has verbally notified assigned CSW. Psychosocial assessment and FL-2 completed. CSW to provide detailed handoff report as well.   This CSW signing off.  Glendon Axe, MSW, LCSWA [phone] 11/09/2014 11:41 AM

## 2014-11-09 NOTE — Progress Notes (Signed)
Pt arrived to 4N11 from [place] transported by nurse.  Patient alert and oriented x 1. In not acute distress.  Telemetry box applied. Vitals signs stable.  Call-bell within reach. Will continue to monitor.

## 2014-11-09 NOTE — Progress Notes (Addendum)
Initial Nutrition Assessment  DOCUMENTATION CODES:  Obesity unspecified  INTERVENTION:   (Recommend Speech Path consult for swallow evaluation prior to PO diet advancement)  NUTRITION DIAGNOSIS:  Inadequate oral intake related to inability to eat as evidenced by NPO status  GOAL:  Patient will meet greater than or equal to 90% of their needs  MONITOR:  Diet advancement, PO intake, Supplement acceptance, Labs, Weight trends, I & O's  REASON FOR ASSESSMENT:  Low Braden  ASSESSMENT: 54 y.o. female with hx of GERD, depression, anxiety, L parietal astrocytoma of brain (s/p excision and radiation therapy 2011), CVA, CKD III, and DVT who presented with worsening mental status and jerking. She was brought in via EMS from her nursing home due to worsening mental status.  MRI showed a new enhancing lesion in left occipital lobe.   RD unable to obtain nutrition hx.  Pt seen per Clinical Nutrition during previous hospital admissions.  + hx of dysphagia.  In the past pt has enjoyed Ensure oral nutrition supplements.  Currently NPO.  Would benefit from Speech Path consult for swallow evaluation prior to diet advancement.  Limited nutrition focused physical exam completed.  No muscle or subcutaneous fat depletion noticed.  Height:  Ht Readings from Last 1 Encounters:  11/06/14 5\' 9"  (1.753 m)    Weight:  Wt Readings from Last 1 Encounters:  11/08/14 158 lb 3.2 oz (71.759 kg)    Ideal Body Weight:  65.9 kg  Wt Readings from Last 10 Encounters:  11/08/14 158 lb 3.2 oz (71.759 kg)  10/12/14 154 lb (69.854 kg)  10/10/14 154 lb (69.854 kg)  09/12/14 156 lb (70.761 kg)  07/20/14 156 lb (70.761 kg)  06/09/14 153 lb (69.4 kg)  05/02/14 152 lb (68.947 kg)  01/18/14 152 lb (68.947 kg)  11/16/13 149 lb (67.586 kg)  07/13/13 142 lb (64.411 kg)    BMI:  Body mass index is 23.35 kg/(m^2).  Estimated Nutritional Needs:  Kcal:  1800-2000  Protein:  90-100 gm  Fluid:  1.8-2.0  L  Skin:  Reviewed, no issues  Diet Order:  Diet NPO time specified Except for: Sips with Meds  EDUCATION NEEDS:  No education needs identified at this time   Intake/Output Summary (Last 24 hours) at 11/09/14 1158 Last data filed at 11/09/14 0611  Gross per 24 hour  Intake 1818.33 ml  Output      0 ml  Net 1818.33 ml    Last BM:  6/5  Arthur Holms, RD, LDN Pager #: [phone] After-Hours Pager #: [phone]

## 2014-11-09 NOTE — Progress Notes (Signed)
EEG Completed; Results Pending  

## 2014-11-09 NOTE — Progress Notes (Signed)
Subjective: Mental status remains altered.  No seizure activity noted.    Objective: Current vital signs: BP 134/78 mmHg  Pulse 67  Temp(Src) 98.8 F (37.1 C) (Oral)  Resp 15  Ht 5\' 9"  (1.753 m)  Wt 71.759 kg (158 lb 3.2 oz)  BMI 23.35 kg/m2  SpO2 95%  LMP 08/26/2010 Vital signs in last 24 hours: Temp:  [98.2 F (36.8 C)-99.8 F (37.7 C)] 98.8 F (37.1 C) (06/09 0811) Pulse Rate:  [66-89] 67 (06/09 0811) Resp:  [14-19] 15 (06/09 0811) BP: (119-139)/(64-86) 134/78 mmHg (06/09 0811) SpO2:  [91 %-99 %] 95 % (06/09 0811) Weight:  [71.759 kg (158 lb 3.2 oz)] 71.759 kg (158 lb 3.2 oz) (06/08 1703)  Intake/Output from previous day: 06/08 0701 - 06/09 0700 In: 2218.3 [I.V.:2218.3] Out: -  Intake/Output this shift:   Nutritional status: Diet NPO time specified Except for: Sips with Meds  Neurologic Exam: Mental Status: Drowsy, follows some simple commands.  Not speaking for me today.  Cranial Nerves: II: No blink to threat bilaterally, pupils equal, round, reactive to light and accommodation III,IV, VI: keeps eyes closed, when eyelids held open will look toward voice on either side, corneal reflex intact.  V,VII: left facial droop, facial light touch sensation normal bilaterally VIII: hearing normal bilaterally IX,X: uvula rises symmetrically XI: bilateral shoulder shrug XII: midline tongue extension without atrophy or fasciculations Motor: Right :Upper extremity 4/5Left: Upper extremity 0/5 held in flexion contracture Lower extremity 3/5Lower extremity 2/5 flexes knee to pain Right arm and leg tremor noted but will stop when I place my hand on arm or leg.  Sensory: Pinprick and light touch intact throughout, bilaterally Deep Tendon Reflexes:  1+ right bicep and brachioradialis. 2+ in left bicep and brachioradialis. No KJ or AJ.   Plantars: Mute  bilaterally   Lab Results: Basic Metabolic Panel:  Recent Labs Lab 11/05/14 2212 11/05/14 2221 11/06/14 0729 11/07/14 0217 11/08/14 0240 11/08/14 1140 11/09/14 0247  NA 141 144 144 143  --  143 140  K 3.5 3.5 3.5 3.9  --  3.9 3.7  CL 112* 112* 117* 113*  --  117* 115*  CO2 19*  --  20* 21*  --  19* 15*  GLUCOSE 104* 101* 96 122*  --  117* 109*  BUN 28* 29* 26* 22*  --  28* 27*  CREATININE 1.73* 1.80* 1.51* 1.24*  --  1.18* 1.18*  CALCIUM 10.1  --  9.7 9.9  --  10.4* 9.7  MG  --   --   --   --  1.8  --   --     Liver Function Tests:  Recent Labs Lab 11/05/14 2212 11/06/14 0729 11/07/14 0217  AST 32 34 59*  ALT 24 25 43  ALKPHOS 80 65 75  BILITOT 0.8 0.7 0.6  PROT 5.9* 5.4* 6.1*  ALBUMIN 3.4* 3.1* 3.2*   No results for input(s): LIPASE, AMYLASE in the last 168 hours. No results for input(s): AMMONIA in the last 168 hours.  CBC:  Recent Labs Lab 11/05/14 2212 11/05/14 2221 11/06/14 0729 11/07/14 0217 11/08/14 0240 11/09/14 0247  WBC 4.3  --  3.3* 2.7* 4.2 5.0  NEUTROABS 2.8  --   --   --  3.3  --   HGB 10.5* 10.2* 9.3* 10.1* 9.2* 8.8*  HCT 32.7* 30.0* 29.3* 32.2* 29.3* 27.5*  MCV 80.5  --  82.3 82.1 80.9 82.1  PLT 108*  --  98* 111* 124* 115*  Cardiac Enzymes: No results for input(s): CKTOTAL, CKMB, CKMBINDEX, TROPONINI in the last 168 hours.  Lipid Panel:  Recent Labs Lab 11/08/14 0240  CHOL 125  TRIG 40  HDL 54  CHOLHDL 2.3  VLDL 8  LDLCALC 63    CBG:  Recent Labs Lab 11/08/14 0804 11/08/14 1644 11/09/14 0009 11/09/14 0435 11/09/14 0810  GLUCAP 105* 103* 111* 119* 122*    Microbiology: Results for orders placed or performed during the hospital encounter of 11/05/14  Culture, blood (x 2)     Status: None (Preliminary result)   Collection Time: 11/06/14  4:35 AM  Result Value Ref Range Status   Specimen Description BLOOD LEFT ARM  Final   Special Requests BAA 5ML  Final   Culture   Final           BLOOD CULTURE RECEIVED  NO GROWTH TO DATE CULTURE WILL BE HELD FOR 5 DAYS BEFORE ISSUING A FINAL NEGATIVE REPORT Performed at [facility]    Report Status PENDING  Incomplete  Culture, blood (x 2)     Status: None (Preliminary result)   Collection Time: 11/06/14  7:30 AM  Result Value Ref Range Status   Specimen Description BLOOD LEFT ASSIST CONTROL  Final   Special Requests BAA 10ML  Final   Culture   Final           BLOOD CULTURE RECEIVED NO GROWTH TO DATE CULTURE WILL BE HELD FOR 5 DAYS BEFORE ISSUING A FINAL NEGATIVE REPORT Performed at [facility]    Report Status PENDING  Incomplete  Urine culture     Status: None   Collection Time: 11/06/14  7:50 AM  Result Value Ref Range Status   Specimen Description URINE, CLEAN CATCH  Final   Special Requests NONE  Final   Colony Count   Final    >=100,000 COLONIES/ML Performed at [facility]    Culture   Final    ESCHERICHIA COLI Performed at [facility]    Report Status 11/08/2014 FINAL  Final   Organism ID, Bacteria ESCHERICHIA COLI  Final      Susceptibility   Escherichia coli - MIC*    AMPICILLIN >=32 RESISTANT Resistant     CEFAZOLIN <=4 SENSITIVE Sensitive     CEFTRIAXONE <=1 SENSITIVE Sensitive     CIPROFLOXACIN <=0.25 SENSITIVE Sensitive     GENTAMICIN <=1 SENSITIVE Sensitive     LEVOFLOXACIN <=0.12 SENSITIVE Sensitive     NITROFURANTOIN <=16 SENSITIVE Sensitive     TOBRAMYCIN <=1 SENSITIVE Sensitive     TRIMETH/SULFA <=20 SENSITIVE Sensitive     PIP/TAZO <=4 SENSITIVE Sensitive     * ESCHERICHIA COLI  MRSA PCR Screening     Status: None   Collection Time: 11/06/14  3:45 PM  Result Value Ref Range Status   MRSA by PCR NEGATIVE NEGATIVE Final    Comment:        The GeneXpert MRSA Assay (FDA approved for NASAL specimens only), is one component of a comprehensive MRSA colonization surveillance program. It is not intended to diagnose MRSA infection nor to guide or monitor treatment for MRSA  infections.     Coagulation Studies: No results for input(s): LABPROT, INR in the last 72 hours.  Imaging: Ct Chest W Contrast  11/08/2014   CLINICAL DATA:  New brain lesion. Evaluation for metastatic disease. Abdominal distention.  EXAM: CT CHEST, ABDOMEN, AND PELVIS WITH CONTRAST  TECHNIQUE: Multidetector CT imaging of the chest, abdomen and pelvis  was performed following the standard protocol during bolus administration of intravenous contrast.  CONTRAST:  42mL OMNIPAQUE IOHEXOL 300 MG/ML  SOLN  COMPARISON:  08/15/2010  FINDINGS: CT CHEST FINDINGS  No enlarged axillary, mediastinal, or hilar lymph nodes are identified. Pulmonary arterial enlargement is similar to the prior study, with the main pulmonary artery measuring approximately 3.7 cm in diameter and suggestive of underlying pulmonary arterial hypertension. The heart is mildly enlarged. There is a small right pleural effusion.  Evaluation of the lung parenchyma is mildly limited by motion artifact. Opacities in the dependent portions of the lower greater than upper lobes bilaterally are favored to represent atelectasis. Major airways are patent. No lung mass or nodules are identified. No lytic or blastic osseous lesions are identified.  CT ABDOMEN AND PELVIS FINDINGS  Evaluation of the upper abdomen is mildly limited by motion artifact. The liver, gallbladder, spleen, adrenal glands, and pancreas are grossly unremarkable. No renal mass or hydronephrosis is identified. A few punctate foci of hyperattenuation in the renal sinuses bilaterally may represent tiny nonobstructing calculi.  Oral contrast is present in multiple loops of nondilated small bowel without evidence of obstruction. There is a moderate amount of stool in the ascending and descending colon. There is mild gaseous distension of the transverse colon to approximately 5.5 cm in diameter, with mild gaseous distention of a portion of the sigmoid colon as well. There is a moderate amount of  liquid stool in the distal sigmoid colon and rectum. No gross bowel wall thickening is identified.  Bladder is moderately distended. Uterus is identified. No pelvic mass is seen. Mild atherosclerotic vascular calcification is noted. No free fluid or enlarged lymph nodes are identified. No suspicious lytic or blastic osseous lesions are identified. Facet arthrosis is noted in the lower lumbar spine, severe on the right at L5-S1 with facet joint ankylosis.  IMPRESSION: 1. No evidence of malignancy in the chest, abdomen, or pelvis. 2. Trace right pleural effusion. 3. Dependent opacities in both lungs, favored to represent atelectasis. 4. No evidence of bowel obstruction. Moderate amount of colonic stool, including liquid stool in the distal colon and rectum. Mild gaseous distention of the transverse colon.   Electronically Signed   By: Logan Bores   On: 11/08/2014 17:07   Ct Abdomen Pelvis W Contrast  11/08/2014   CLINICAL DATA:  New brain lesion. Evaluation for metastatic disease. Abdominal distention.  EXAM: CT CHEST, ABDOMEN, AND PELVIS WITH CONTRAST  TECHNIQUE: Multidetector CT imaging of the chest, abdomen and pelvis was performed following the standard protocol during bolus administration of intravenous contrast.  CONTRAST:  6mL OMNIPAQUE IOHEXOL 300 MG/ML  SOLN  COMPARISON:  08/15/2010  FINDINGS: CT CHEST FINDINGS  No enlarged axillary, mediastinal, or hilar lymph nodes are identified. Pulmonary arterial enlargement is similar to the prior study, with the main pulmonary artery measuring approximately 3.7 cm in diameter and suggestive of underlying pulmonary arterial hypertension. The heart is mildly enlarged. There is a small right pleural effusion.  Evaluation of the lung parenchyma is mildly limited by motion artifact. Opacities in the dependent portions of the lower greater than upper lobes bilaterally are favored to represent atelectasis. Major airways are patent. No lung mass or nodules are identified.  No lytic or blastic osseous lesions are identified.  CT ABDOMEN AND PELVIS FINDINGS  Evaluation of the upper abdomen is mildly limited by motion artifact. The liver, gallbladder, spleen, adrenal glands, and pancreas are grossly unremarkable. No renal mass or hydronephrosis is identified. A few  punctate foci of hyperattenuation in the renal sinuses bilaterally may represent tiny nonobstructing calculi.  Oral contrast is present in multiple loops of nondilated small bowel without evidence of obstruction. There is a moderate amount of stool in the ascending and descending colon. There is mild gaseous distension of the transverse colon to approximately 5.5 cm in diameter, with mild gaseous distention of a portion of the sigmoid colon as well. There is a moderate amount of liquid stool in the distal sigmoid colon and rectum. No gross bowel wall thickening is identified.  Bladder is moderately distended. Uterus is identified. No pelvic mass is seen. Mild atherosclerotic vascular calcification is noted. No free fluid or enlarged lymph nodes are identified. No suspicious lytic or blastic osseous lesions are identified. Facet arthrosis is noted in the lower lumbar spine, severe on the right at L5-S1 with facet joint ankylosis.  IMPRESSION: 1. No evidence of malignancy in the chest, abdomen, or pelvis. 2. Trace right pleural effusion. 3. Dependent opacities in both lungs, favored to represent atelectasis. 4. No evidence of bowel obstruction. Moderate amount of colonic stool, including liquid stool in the distal colon and rectum. Mild gaseous distention of the transverse colon.   Electronically Signed   By: Logan Bores   On: 11/08/2014 17:07    Medications:  I have reviewed the patient's current medications. Scheduled: . antiseptic oral rinse  7 mL Mouth Rinse q12n4p  . ARIPiprazole  7 mg Oral Daily  . cefTRIAXone (ROCEPHIN)  IV  1 g Intravenous Q24H  . chlorhexidine  15 mL Mouth Rinse BID  . dexamethasone  4 mg  Intravenous 3 times per day  . hydrocortisone  1 application Rectal BID  . lacosamide  50 mg Oral BID  . lamoTRIgine  100 mg Oral Daily  . levETIRAcetam  500 mg Intravenous Q12H  . pantoprazole (PROTONIX) IV  40 mg Intravenous Q24H    Assessment/Plan: Patient remains altered.  On Keppra, Vimpat and Lamictal.  No seizure activity noted.  On Rocephin for UTI.  New enhancing left occipital lesion noted on MRI of the brain.  CT performed of the chest/abdomen and pelvis with no primary identified.    Recommendations: 1.  Follow up MRI will need to be performed at a later date to follow occipital lesion 2.  Repeat EEG.     LOS: 3 days   Alexis Goodell, MD [facility] [phone] 11/09/2014  12:25 PM

## 2014-11-10 LAB — GLUCOSE, CAPILLARY
GLUCOSE-CAPILLARY: 107 mg/dL — AB (ref 65–99)
GLUCOSE-CAPILLARY: 113 mg/dL — AB (ref 65–99)

## 2014-11-10 NOTE — Progress Notes (Addendum)
Progress Note  [name] WLS:[id number] DOB: 04/01/1961 DOA: 11/05/2014 PCP: [name], DO  Admit HPI / Brief Narrative: 54 y.o. 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  Presents with worsening mental status and jerking. She was brought in via EMS from her nursing home due to worsening mental status. At baseline, she could say ' yes or no" to questioning. But since Saturday, she could not interact with staff as she normally did. She seems to be more confused.   In ED, patient was found to have WBC 4.3, temperature 99.7, no tachycardia, INR 1.2, PTT 29, positive urinalysis, troponin negative, AoCKD-III. Patient is admitted to inpatient for further evaluation and treatment. Neurology was consulted.   HPI/Subjective: Awake, c/o left knee pain (bruise) I asked patient if she would like ice for it and she said "no, ibuprofen"  Assessment/Plan: Acute encephalopathy -Likely toxic metabolic encephalopathy related to Escherichia coli pyelonephritis +/- contribution from brain mass - LP does not appear to be clearly indicated; Dr. 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  UTI -Continue Rocephin and complete 5 day course of antibiotics.  Seizure -EEG not consistent with seizure -Continue Vimpat 50 mg BID; -Continue Lamictal 100 mg daily; 6/7 within therapeutic level  -Continue Keppra 500 mg BID; 6/7 level = 43.5 high   HLD -Lipid panel within NCEP guidelines  History of stroke  Acute on CKD stage  3 -Improving -Continue normal saline 132m/hr  GERD  Depression -Continue Abilify 7 mg daily  Pancytopenia -Fairly recent, anemia panel pending, LDH, haptoglobin, TNA, RNP, ESR, CRP -Resolving spontaneously; likely due to gram-negative serious infection   Code Status: FULL Family Communication: no family present at time of exam [place]  Consultants: DBarry Dienes(neuro hospitalist)  Procedure/Significant Events: 6/6 EEG;-Abnormal EEG due to generalized slowing indicating a mild to moderate cerebral disturbance (encephalopathy). Intermittent focal slowing is noted in the left posterior region indicating a potential seizure foci. 6/6 MRI brain with and without contrast;-New 11 x 9 x 11 mm rim enhancing lesion within the medial left occipital lobe with associated localized vasogenic edema.  -7 mm focus of nodular enhancement adjacent to the dilated body of the right lateral ventricle which is similar to previous exam. -Remote right MCA territory infarcts  6/9 EEG;abnormal EEG secondary to general background slowing  Culture   Antibiotics: Rocephin 6/6>>  DVT prophylaxis: SCD   Devices    LINES / TUBES:      Continuous Infusions: . sodium chloride 100 mL/hr at 11/10/14 0436    Objective: VITAL SIGNS: Temp: 98.7 F (37.1 C) (06/10 1044) Temp Source: Oral (06/10 1044) BP: 148/77 mmHg (06/10 1044) Pulse Rate: 71 (06/10 1044) SPO2; FIO2:   Intake/Output Summary (Last 24 hours) at 11/10/14 1304 Last data filed at 11/09/14 1900  Gross per 24 hour  Intake    120 ml  Output      0 ml  Net    120 ml     Exam: General:will awaken and engage in conversation not aware of where she is "[place]" Lungs: Clear to  auscultation bilaterally without wheezes or crackles Cardiovascular: Regular rate and rhythm without murmur gallop or rub normal S1 and S2 Abdomen:negative abdominal pain, negative dysphagia, Nontender,  nondistended, soft, bowel sounds positive, no rebound, no ascites, no appreciable mass Extremities: No significant cyanosis, clubbing, or edema bilateral lower extremities Neurologic:  Not following commands   Data Reviewed: Basic Metabolic Panel:  Recent Labs Lab 11/05/14 2212 11/05/14 2221 11/06/14 0729 11/07/14 0217 11/08/14 0240 11/08/14 1140 11/09/14 0247  NA 141 144 144 143  --  143 140  K 3.5 3.5 3.5 3.9  --  3.9 3.7  CL 112* 112* 117* 113*  --  117* 115*  CO2 19*  --  20* 21*  --  19* 15*  GLUCOSE 104* 101* 96 122*  --  117* 109*  BUN 28* 29* 26* 22*  --  28* 27*  CREATININE 1.73* 1.80* 1.51* 1.24*  --  1.18* 1.18*  CALCIUM 10.1  --  9.7 9.9  --  10.4* 9.7  MG  --   --   --   --  1.8  --   --    Liver Function Tests:  Recent Labs Lab 11/05/14 2212 11/06/14 0729 11/07/14 0217  AST 32 34 59*  ALT 24 25 43  ALKPHOS 80 65 75  BILITOT 0.8 0.7 0.6  PROT 5.9* 5.4* 6.1*  ALBUMIN 3.4* 3.1* 3.2*   No results for input(s): LIPASE, AMYLASE in the last 168 hours. No results for input(s): AMMONIA in the last 168 hours. CBC:  Recent Labs Lab 11/05/14 2212 11/05/14 2221 11/06/14 0729 11/07/14 0217 11/08/14 0240 11/09/14 0247  WBC 4.3  --  3.3* 2.7* 4.2 5.0  NEUTROABS 2.8  --   --   --  3.3  --   HGB 10.5* 10.2* 9.3* 10.1* 9.2* 8.8*  HCT 32.7* 30.0* 29.3* 32.2* 29.3* 27.5*  MCV 80.5  --  82.3 82.1 80.9 82.1  PLT 108*  --  98* 111* 124* 115*   Cardiac Enzymes: No results for input(s): CKTOTAL, CKMB, CKMBINDEX, TROPONINI in the last 168 hours. BNP (last 3 results) No results for input(s): BNP in the last 8760 hours.  ProBNP (last 3 results) No results for input(s): PROBNP in the last 8760 hours.  CBG:  Recent Labs Lab 11/09/14 0435 11/09/14 0810 11/09/14 1820 11/10/14 0059 11/10/14 0828  GLUCAP 119* 122* 103* 113* 107*    Recent Results (from the past 240 hour(s))  Culture, blood (x 2)     Status: None (Preliminary result)   Collection Time:  11/06/14  4:35 AM  Result Value Ref Range Status   Specimen Description BLOOD LEFT ARM  Final   Special Requests BAA 5ML  Final   Culture   Final           BLOOD CULTURE RECEIVED NO GROWTH TO DATE CULTURE WILL BE HELD FOR 5 DAYS BEFORE ISSUING A FINAL NEGATIVE REPORT Performed at [facility]    Report Status PENDING  Incomplete  Culture, blood (x 2)     Status: None (Preliminary result)   Collection Time: 11/06/14  7:30 AM  Result Value Ref Range Status   Specimen Description BLOOD LEFT ASSIST CONTROL  Final   Special Requests BAA 10ML  Final   Culture   Final           BLOOD CULTURE RECEIVED NO GROWTH TO DATE CULTURE WILL BE HELD FOR 5 DAYS BEFORE ISSUING A FINAL NEGATIVE REPORT Performed at [facility]  Report Status PENDING  Incomplete  Urine culture     Status: None   Collection Time: 11/06/14  7:50 AM  Result Value Ref Range Status   Specimen Description URINE, CLEAN CATCH  Final   Special Requests NONE  Final   Colony Count   Final    >=100,000 COLONIES/ML Performed at [facility]    Culture   Final    ESCHERICHIA COLI Performed at [facility]    Report Status 11/08/2014 FINAL  Final   Organism ID, Bacteria ESCHERICHIA COLI  Final      Susceptibility   Escherichia coli - MIC*    AMPICILLIN >=32 RESISTANT Resistant     CEFAZOLIN <=4 SENSITIVE Sensitive     CEFTRIAXONE <=1 SENSITIVE Sensitive     CIPROFLOXACIN <=0.25 SENSITIVE Sensitive     GENTAMICIN <=1 SENSITIVE Sensitive     LEVOFLOXACIN <=0.12 SENSITIVE Sensitive     NITROFURANTOIN <=16 SENSITIVE Sensitive     TOBRAMYCIN <=1 SENSITIVE Sensitive     TRIMETH/SULFA <=20 SENSITIVE Sensitive     PIP/TAZO <=4 SENSITIVE Sensitive     * ESCHERICHIA COLI  MRSA PCR Screening     Status: None   Collection Time: 11/06/14  3:45 PM  Result Value Ref Range Status   MRSA by PCR NEGATIVE NEGATIVE Final    Comment:        The GeneXpert MRSA Assay (FDA approved for NASAL  specimens only), is one component of a comprehensive MRSA colonization surveillance program. It is not intended to diagnose MRSA infection nor to guide or monitor treatment for MRSA infections.      Studies:  Recent x-ray studies have been reviewed in detail by the Attending Physician  Scheduled Meds:  Scheduled Meds: . antiseptic oral rinse  7 mL Mouth Rinse q12n4p  . ARIPiprazole  7 mg Oral Daily  . cefTRIAXone (ROCEPHIN)  IV  1 g Intravenous Q24H  . chlorhexidine  15 mL Mouth Rinse BID  . dexamethasone  4 mg Intravenous 3 times per day  . hydrocortisone  1 application Rectal BID  . lacosamide  50 mg Oral BID  . lamoTRIgine  100 mg Oral Daily  . levETIRAcetam  500 mg Intravenous Q12H  . pantoprazole (PROTONIX) IV  40 mg Intravenous Q24H    Time spent on care of this patient: 35 mins   Eulogio Bear , DO  [facility] Office  [phone] Pager - [phone]  On-Call/Text Page:      [email or web address]      password [email or web address]  If 7PM-7AM, please contact night-coverage [email or web address] Password [email or web address] 11/10/2014, 1:04 PM   LOS: 4 days

## 2014-11-10 NOTE — Progress Notes (Signed)
Pt's father was able to answer some questions related to pt.

## 2014-11-10 NOTE — Clinical Social Work Note (Signed)
Patient is a long-term resident of [facility] and plans to return once medically stable for discharge. Patient's FATHER, BOB NELSON is HEALTH CARE POWER OF ATTORNEY.  FL-2 and clinical updates faxed to [facility].   CSW will continue to follow pt and pt's family for continued support and to facilitate pt's discharge needs once medically stable.   FL-2 on chart for MD signature.   Glendon Axe, MSW, LCSWA [phone] 11/10/2014 2:54 PM

## 2014-11-10 NOTE — Consult Note (Signed)
Reason for Consult:brain tumor Referring Physician: hospitalist  [name] is an 54 y.o. female.  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 admitted to [facility] 5 days ago with changes in her mental status . Work up including a Primary school teacher showed a new lesion in the left occipital area and postop changes in the right side with an old nodule.  Past Medical History  Diagnosis Date  . 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 Fracture, ankle 2012    right  . History of radiation therapy 09/23/10- 11/07/10    right parietal lobe  . Chronic headaches     uses oxycodone appropriately  . Anxiety   . Muscle spasticity   . Seizures   . Dysphagia   . GERD (gastroesophageal reflux disease)     Past Surgical History  Procedure Laterality Date  . Orif ankle fracture  08/22/2010    right  . Radiology with anesthesia N/A 05/02/2013    Procedure: RADIOLOGY WITH ANESTHESIA;  Surgeon: Consuella Lose, MD;  Location: [facility];  Service: Radiology;  Laterality: N/A;  . Brain surgery Right 2012    astrocytoma R parietal lobe    Family History  Problem Relation Age of Onset  . Breast cancer Mother     Social History:  reports that she has never smoked. She has never used smokeless tobacco. She reports that she drinks alcohol. She reports that she does not use illicit drugs.  Allergies:  Allergies  Allergen Reactions  . Vasotec Other (See Comments)    Causes angioedema    Medications: see HP  Results for orders placed or performed during the hospital encounter of 11/05/14 (from the past 48 hour(s))  Glucose, capillary     Status: Abnormal   Collection Time: 11/09/14 12:09 AM  Result Value Ref Range   Glucose-Capillary 111 (H) 65 - 99 mg/dL  Basic metabolic panel     Status: Abnormal   Collection Time: 11/09/14  2:47 AM  Result Value Ref Range   Sodium 140 135 - 145 mmol/L   Potassium 3.7  3.5 - 5.1 mmol/L   Chloride 115 (H) 101 - 111 mmol/L   CO2 15 (L) 22 - 32 mmol/L   Glucose, Bld 109 (H) 65 - 99 mg/dL   BUN 27 (H) 6 - 20 mg/dL   Creatinine, Ser 1.18 (H) 0.44 - 1.00 mg/dL   Calcium 9.7 8.9 - 10.3 mg/dL   GFR calc non Af Amer 51 (L) >60 mL/min   GFR calc Af Amer 59 (L) >60 mL/min    Comment: (NOTE) The eGFR has been calculated using the CKD EPI equation. This calculation has not been validated in all clinical situations. eGFR's persistently <60 mL/min signify possible Chronic Kidney Disease.    Anion gap 10 5 - 15  CBC     Status: Abnormal   Collection Time: 11/09/14  2:47 AM  Result Value Ref Range   WBC 5.0 4.0 - 10.5 K/uL   RBC 3.35 (L) 3.87 - 5.11 MIL/uL   Hemoglobin 8.8 (L) 12.0 - 15.0 g/dL   HCT 27.5 (L) 36.0 - 46.0 %   MCV 82.1 78.0 - 100.0 fL   MCH 26.3 26.0 - 34.0 pg   MCHC 32.0 30.0 - 36.0 g/dL   RDW 15.3 11.5 - 15.5 %   Platelets 115 (L) 150 - 400 K/uL  Comment: PLATELET COUNT CONFIRMED BY SMEAR  Glucose, capillary     Status: Abnormal   Collection Time: 11/09/14  4:35 AM  Result Value Ref Range   Glucose-Capillary 119 (H) 65 - 99 mg/dL  Glucose, capillary     Status: Abnormal   Collection Time: 11/09/14  8:10 AM  Result Value Ref Range   Glucose-Capillary 122 (H) 65 - 99 mg/dL  Glucose, capillary     Status: Abnormal   Collection Time: 11/09/14  6:20 PM  Result Value Ref Range   Glucose-Capillary 103 (H) 65 - 99 mg/dL  Glucose, capillary     Status: Abnormal   Collection Time: 11/10/14 12:59 AM  Result Value Ref Range   Glucose-Capillary 113 (H) 65 - 99 mg/dL  Glucose, capillary     Status: Abnormal   Collection Time: 11/10/14  8:28 AM  Result Value Ref Range   Glucose-Capillary 107 (H) 65 - 99 mg/dL    No results found.  Review of Systems  Unable to perform ROS: mental acuity   Blood pressure 138/68, pulse 72, temperature 98.1 F (36.7 C), temperature source Oral, resp. rate 20, height 5' 9"  (1.753 m), weight 71.759 kg (158 lb  3.2 oz), last menstrual period 08/26/2010, SpO2 98 %. Physical Exam Neuro, able to say yes and no. No comunication whatsoever. Left spastic  Hemiplegia ; left 7 central. Mri show  A new tumor in the left occipital lobe with vasogenic edema. Assessment/Plan: Doubt that this new tumor is the source of her neuro condition, nevertheless a consult with radiation oncology is adviseble to see if she is a candidate for radiosurgery Dr Kathyrn Sheriff will ger involved if that's the case. Will follow  Timya Trimmer M 11/10/2014, 5:36 PM

## 2014-11-10 NOTE — Progress Notes (Signed)
Subjective: Patient sitting up in bed awake.  Remains confused.  It seems that at baseline patient is not able to hold a fully fluent conversation but can at least stay on topic.  This is far from her baseline.   No further seizure activity noted.      Objective: Current vital signs: BP 142/83 mmHg  Pulse 67  Temp(Src) 98.2 F (36.8 C) (Oral)  Resp 18  Ht 5\' 9"  (1.753 m)  Wt 71.759 kg (158 lb 3.2 oz)  BMI 23.35 kg/m2  SpO2 95%  LMP 08/26/2010 Vital signs in last 24 hours: Temp:  [98.2 F (36.8 C)-99.1 F (37.3 C)] 98.2 F (36.8 C) (06/10 0520) Pulse Rate:  [54-79] 67 (06/10 0520) Resp:  [10-18] 18 (06/10 0520) BP: (121-149)/(67-97) 142/83 mmHg (06/10 0520) SpO2:  [92 %-97 %] 95 % (06/10 0520)  Intake/Output from previous day: 06/09 0701 - 06/10 0700 In: 270 [I.V.:120; IV Piggyback:150] Out: -  Intake/Output this shift:   Nutritional status: Diet NPO time specified Except for: Sips with Meds  Neurologic Exam: Mental Status:  Awake and alert.  Follows commands.  Continues to talk about things unrelated to the current conversation.  Today speaking about whether seats are leather or vinyl.   Cranial Nerves:  II: Blinks to threat bilaterally, pupils equal, round, reactive to light and accommodation  III,IV, VI: Corneal reflex intact.  V,VII: left facial droop, facial light touch sensation normal bilaterally  VIII: hearing normal bilaterally  IX,X: uvula rises symmetrically  XI: bilateral shoulder shrug  XII: midline tongue extension without atrophy or fasciculations  Motor:  Right : Upper extremity 4/5          Left: Upper extremity 0/5 held in flexion contracture   Lower extremity 3/5       Lower extremity 2/5 flexes knee to pain  Right arm and leg tremor noted but will stop when I place my hand on arm or leg.  Sensory: Pinprick and light touch intact throughout, bilaterally  Deep Tendon Reflexes:  1+ right bicep and brachioradialis. 2+ in left bicep and  brachioradialis. No KJ or AJ.    Lab Results: Basic Metabolic Panel:  Recent Labs Lab 11/05/14 2212 11/05/14 2221 11/06/14 0729 11/07/14 0217 11/08/14 0240 11/08/14 1140 11/09/14 0247  NA 141 144 144 143  --  143 140  K 3.5 3.5 3.5 3.9  --  3.9 3.7  CL 112* 112* 117* 113*  --  117* 115*  CO2 19*  --  20* 21*  --  19* 15*  GLUCOSE 104* 101* 96 122*  --  117* 109*  BUN 28* 29* 26* 22*  --  28* 27*  CREATININE 1.73* 1.80* 1.51* 1.24*  --  1.18* 1.18*  CALCIUM 10.1  --  9.7 9.9  --  10.4* 9.7  MG  --   --   --   --  1.8  --   --     Liver Function Tests:  Recent Labs Lab 11/05/14 2212 11/06/14 0729 11/07/14 0217  AST 32 34 59*  ALT 24 25 43  ALKPHOS 80 65 75  BILITOT 0.8 0.7 0.6  PROT 5.9* 5.4* 6.1*  ALBUMIN 3.4* 3.1* 3.2*   No results for input(s): LIPASE, AMYLASE in the last 168 hours. No results for input(s): AMMONIA in the last 168 hours.  CBC:  Recent Labs Lab 11/05/14 2212 11/05/14 2221 11/06/14 0729 11/07/14 0217 11/08/14 0240 11/09/14 0247  WBC 4.3  --  3.3* 2.7* 4.2 5.0  NEUTROABS 2.8  --   --   --  3.3  --   HGB 10.5* 10.2* 9.3* 10.1* 9.2* 8.8*  HCT 32.7* 30.0* 29.3* 32.2* 29.3* 27.5*  MCV 80.5  --  82.3 82.1 80.9 82.1  PLT 108*  --  98* 111* 124* 115*    Cardiac Enzymes: No results for input(s): CKTOTAL, CKMB, CKMBINDEX, TROPONINI in the last 168 hours.  Lipid Panel:  Recent Labs Lab 11/08/14 0240  CHOL 125  TRIG 40  HDL 54  CHOLHDL 2.3  VLDL 8  LDLCALC 63    CBG:  Recent Labs Lab 11/09/14 0009 11/09/14 0435 11/09/14 0810 11/09/14 1820 11/10/14 0059  GLUCAP 111* 119* 122* 103* 113*    Microbiology: Results for orders placed or performed during the hospital encounter of 11/05/14  Culture, blood (x 2)     Status: None (Preliminary result)   Collection Time: 11/06/14  4:35 AM  Result Value Ref Range Status   Specimen Description BLOOD LEFT ARM  Final   Special Requests BAA 5ML  Final   Culture   Final           BLOOD  CULTURE RECEIVED NO GROWTH TO DATE CULTURE WILL BE HELD FOR 5 DAYS BEFORE ISSUING A FINAL NEGATIVE REPORT Performed at [facility]    Report Status PENDING  Incomplete  Culture, blood (x 2)     Status: None (Preliminary result)   Collection Time: 11/06/14  7:30 AM  Result Value Ref Range Status   Specimen Description BLOOD LEFT ASSIST CONTROL  Final   Special Requests BAA 10ML  Final   Culture   Final           BLOOD CULTURE RECEIVED NO GROWTH TO DATE CULTURE WILL BE HELD FOR 5 DAYS BEFORE ISSUING A FINAL NEGATIVE REPORT Performed at [facility]    Report Status PENDING  Incomplete  Urine culture     Status: None   Collection Time: 11/06/14  7:50 AM  Result Value Ref Range Status   Specimen Description URINE, CLEAN CATCH  Final   Special Requests NONE  Final   Colony Count   Final    >=100,000 COLONIES/ML Performed at [facility]    Culture   Final    ESCHERICHIA COLI Performed at [facility]    Report Status 11/08/2014 FINAL  Final   Organism ID, Bacteria ESCHERICHIA COLI  Final      Susceptibility   Escherichia coli - MIC*    AMPICILLIN >=32 RESISTANT Resistant     CEFAZOLIN <=4 SENSITIVE Sensitive     CEFTRIAXONE <=1 SENSITIVE Sensitive     CIPROFLOXACIN <=0.25 SENSITIVE Sensitive     GENTAMICIN <=1 SENSITIVE Sensitive     LEVOFLOXACIN <=0.12 SENSITIVE Sensitive     NITROFURANTOIN <=16 SENSITIVE Sensitive     TOBRAMYCIN <=1 SENSITIVE Sensitive     TRIMETH/SULFA <=20 SENSITIVE Sensitive     PIP/TAZO <=4 SENSITIVE Sensitive     * ESCHERICHIA COLI  MRSA PCR Screening     Status: None   Collection Time: 11/06/14  3:45 PM  Result Value Ref Range Status   MRSA by PCR NEGATIVE NEGATIVE Final    Comment:        The GeneXpert MRSA Assay (FDA approved for NASAL specimens only), is one component of a comprehensive MRSA colonization surveillance program. It is not intended to diagnose MRSA infection nor to guide or monitor treatment  for MRSA infections.     Coagulation  Studies: No results for input(s): LABPROT, INR in the last 72 hours.  Imaging: Ct Chest W Contrast  11/08/2014   CLINICAL DATA:  New brain lesion. Evaluation for metastatic disease. Abdominal distention.  EXAM: CT CHEST, ABDOMEN, AND PELVIS WITH CONTRAST  TECHNIQUE: Multidetector CT imaging of the chest, abdomen and pelvis was performed following the standard protocol during bolus administration of intravenous contrast.  CONTRAST:  63mL OMNIPAQUE IOHEXOL 300 MG/ML  SOLN  COMPARISON:  08/15/2010  FINDINGS: CT CHEST FINDINGS  No enlarged axillary, mediastinal, or hilar lymph nodes are identified. Pulmonary arterial enlargement is similar to the prior study, with the main pulmonary artery measuring approximately 3.7 cm in diameter and suggestive of underlying pulmonary arterial hypertension. The heart is mildly enlarged. There is a small right pleural effusion.  Evaluation of the lung parenchyma is mildly limited by motion artifact. Opacities in the dependent portions of the lower greater than upper lobes bilaterally are favored to represent atelectasis. Major airways are patent. No lung mass or nodules are identified. No lytic or blastic osseous lesions are identified.  CT ABDOMEN AND PELVIS FINDINGS  Evaluation of the upper abdomen is mildly limited by motion artifact. The liver, gallbladder, spleen, adrenal glands, and pancreas are grossly unremarkable. No renal mass or hydronephrosis is identified. A few punctate foci of hyperattenuation in the renal sinuses bilaterally may represent tiny nonobstructing calculi.  Oral contrast is present in multiple loops of nondilated small bowel without evidence of obstruction. There is a moderate amount of stool in the ascending and descending colon. There is mild gaseous distension of the transverse colon to approximately 5.5 cm in diameter, with mild gaseous distention of a portion of the sigmoid colon as well. There is a moderate  amount of liquid stool in the distal sigmoid colon and rectum. No gross bowel wall thickening is identified.  Bladder is moderately distended. Uterus is identified. No pelvic mass is seen. Mild atherosclerotic vascular calcification is noted. No free fluid or enlarged lymph nodes are identified. No suspicious lytic or blastic osseous lesions are identified. Facet arthrosis is noted in the lower lumbar spine, severe on the right at L5-S1 with facet joint ankylosis.  IMPRESSION: 1. No evidence of malignancy in the chest, abdomen, or pelvis. 2. Trace right pleural effusion. 3. Dependent opacities in both lungs, favored to represent atelectasis. 4. No evidence of bowel obstruction. Moderate amount of colonic stool, including liquid stool in the distal colon and rectum. Mild gaseous distention of the transverse colon.   Electronically Signed   By: Logan Bores   On: 11/08/2014 17:07   Ct Abdomen Pelvis W Contrast  11/08/2014   CLINICAL DATA:  New brain lesion. Evaluation for metastatic disease. Abdominal distention.  EXAM: CT CHEST, ABDOMEN, AND PELVIS WITH CONTRAST  TECHNIQUE: Multidetector CT imaging of the chest, abdomen and pelvis was performed following the standard protocol during bolus administration of intravenous contrast.  CONTRAST:  64mL OMNIPAQUE IOHEXOL 300 MG/ML  SOLN  COMPARISON:  08/15/2010  FINDINGS: CT CHEST FINDINGS  No enlarged axillary, mediastinal, or hilar lymph nodes are identified. Pulmonary arterial enlargement is similar to the prior study, with the main pulmonary artery measuring approximately 3.7 cm in diameter and suggestive of underlying pulmonary arterial hypertension. The heart is mildly enlarged. There is a small right pleural effusion.  Evaluation of the lung parenchyma is mildly limited by motion artifact. Opacities in the dependent portions of the lower greater than upper lobes bilaterally are favored to represent atelectasis. Major airways are patent.  No lung mass or nodules are  identified. No lytic or blastic osseous lesions are identified.  CT ABDOMEN AND PELVIS FINDINGS  Evaluation of the upper abdomen is mildly limited by motion artifact. The liver, gallbladder, spleen, adrenal glands, and pancreas are grossly unremarkable. No renal mass or hydronephrosis is identified. A few punctate foci of hyperattenuation in the renal sinuses bilaterally may represent tiny nonobstructing calculi.  Oral contrast is present in multiple loops of nondilated small bowel without evidence of obstruction. There is a moderate amount of stool in the ascending and descending colon. There is mild gaseous distension of the transverse colon to approximately 5.5 cm in diameter, with mild gaseous distention of a portion of the sigmoid colon as well. There is a moderate amount of liquid stool in the distal sigmoid colon and rectum. No gross bowel wall thickening is identified.  Bladder is moderately distended. Uterus is identified. No pelvic mass is seen. Mild atherosclerotic vascular calcification is noted. No free fluid or enlarged lymph nodes are identified. No suspicious lytic or blastic osseous lesions are identified. Facet arthrosis is noted in the lower lumbar spine, severe on the right at L5-S1 with facet joint ankylosis.  IMPRESSION: 1. No evidence of malignancy in the chest, abdomen, or pelvis. 2. Trace right pleural effusion. 3. Dependent opacities in both lungs, favored to represent atelectasis. 4. No evidence of bowel obstruction. Moderate amount of colonic stool, including liquid stool in the distal colon and rectum. Mild gaseous distention of the transverse colon.   Electronically Signed   By: Logan Bores   On: 11/08/2014 17:07    Medications:  I have reviewed the patient's current medications. Scheduled: . antiseptic oral rinse  7 mL Mouth Rinse q12n4p  . ARIPiprazole  7 mg Oral Daily  . cefTRIAXone (ROCEPHIN)  IV  1 g Intravenous Q24H  . chlorhexidine  15 mL Mouth Rinse BID  .  dexamethasone  4 mg Intravenous 3 times per day  . hydrocortisone  1 application Rectal BID  . lacosamide  50 mg Oral BID  . lamoTRIgine  100 mg Oral Daily  . levETIRAcetam  500 mg Intravenous Q12H  . pantoprazole (PROTONIX) IV  40 mg Intravenous Q24H    Assessment/Plan: No further seizures noted.  EEG performed on yesterday is only significant for slowing.  Has received 5 days of antibiotics (Rocephin) for UTI.  Altered mental status somewhat improved today but not at baseline.  Patient may very well still be encephalopathic from her infection.  Due to her underlying brain pathology she may take longer to recover from that with the infection clearing prior to her brain function.    Recommendations: 1.  Will continue to follow with you.     LOS: 4 days   Alexis Goodell, MD [facility] [phone] 11/10/2014  9:02 AM

## 2014-11-11 LAB — CBC
HCT: 29.9 % — ABNORMAL LOW (ref 36.0–46.0)
HEMOGLOBIN: 9.7 g/dL — AB (ref 12.0–15.0)
MCH: 25.9 pg — ABNORMAL LOW (ref 26.0–34.0)
MCHC: 32.4 g/dL (ref 30.0–36.0)
MCV: 79.7 fL (ref 78.0–100.0)
Platelets: 98 10*3/uL — ABNORMAL LOW (ref 150–400)
RBC: 3.75 MIL/uL — ABNORMAL LOW (ref 3.87–5.11)
RDW: 15 % (ref 11.5–15.5)
WBC: 3.6 10*3/uL — ABNORMAL LOW (ref 4.0–10.5)

## 2014-11-11 LAB — BASIC METABOLIC PANEL
ANION GAP: 9 (ref 5–15)
BUN: 26 mg/dL — AB (ref 6–20)
CO2: 18 mmol/L — AB (ref 22–32)
Calcium: 9.9 mg/dL (ref 8.9–10.3)
Chloride: 116 mmol/L — ABNORMAL HIGH (ref 101–111)
Creatinine, Ser: 1.18 mg/dL — ABNORMAL HIGH (ref 0.44–1.00)
GFR calc Af Amer: 59 mL/min — ABNORMAL LOW (ref >60)
GFR calc non Af Amer: 51 mL/min — ABNORMAL LOW (ref >60)
Glucose, Bld: 108 mg/dL — ABNORMAL HIGH (ref 65–99)
Potassium: 3.9 mmol/L (ref 3.5–5.1)
SODIUM: 143 mmol/L (ref 135–145)

## 2014-11-11 LAB — GLUCOSE, CAPILLARY
GLUCOSE-CAPILLARY: 98 mg/dL (ref 65–99)
GLUCOSE-CAPILLARY: 117 mg/dL — AB (ref 65–99)
Glucose-Capillary: 101 mg/dL — ABNORMAL HIGH (ref 65–99)

## 2014-11-11 LAB — AMMONIA: AMMONIA: 30 umol/L (ref 9–35)

## 2014-11-11 MED ORDER — DEXTROSE-NACL 5-0.45 % IV SOLN
INTRAVENOUS | Status: DC
  Administered 2014-11-11 (×2): via INTRAVENOUS

## 2014-11-11 NOTE — Progress Notes (Signed)
Patient ID: [name], female   DOB: 1960-10-25, 54 y.o.   MRN: [id number] No seizures, awake but not talking. Will get the MD input BEFORE radiosurgery

## 2014-11-11 NOTE — Progress Notes (Addendum)
Progress Note  [name] FBP:[id number] DOB: 07/02/1960 DOA: 11/05/2014 PCP: [name], DO  Admit HPI / Brief Narrative: 54 y.o. 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  Presents with worsening mental status and jerking. She was brought in via EMS from her nursing home due to worsening mental status. At baseline, she could say ' yes or no" to questioning. But since Saturday, she could not interact with staff as she normally did. She seems to be more confused.   In ED, patient was found to have WBC 4.3, temperature 99.7, no tachycardia, INR 1.2, PTT 29, positive urinalysis, troponin negative, AoCKD-III.   HPI/Subjective: Slower to respond today  Assessment/Plan: Acute encephalopathy -Likely toxic metabolic encephalopathy related to Escherichia coli pyelonephritis +/- contribution from brain mass - LP does not appear to be clearly indicated; Dr. Thereasa Solo discussed this with neurology  -Patient continues to have poor improvement will need to discuss LTAC vs SNF  New 11 x 9 x 11 mm rim enhancing lesion within the medial left occipital lobe/ Hx of astrocytoma -Dexamethasone per neuro hospitalist -Consult neurosurgery for recommendations -per  NS recs asked rad/onc to see-- Dr. [name] to see this AM  Dysphagia?? 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  UTI -Continue Rocephin and complete 5 day course of antibiotics.  Seizure -EEG not consistent with seizure -Continue Vimpat 50 mg BID; -Continue Lamictal 100 mg daily; 6/7 within therapeutic level  -Continue Keppra 500 mg BID; 6/7 level = 43.5 high   HLD -Lipid panel within NCEP guidelines  History of stroke  Acute on CKD stage 3 -Improving -Continue normal saline  123ml/hr  GERD  Depression -Continue Abilify 7 mg daily  Pancytopenia -likely due to gram-negative serious infection   Code Status: FULL Family Communication: updated husband Disposition Plan: SNF- from [place]: Barry Dienes (neuro hospitalist)  Procedure/Significant Events: 6/6 EEG;-Abnormal EEG due to generalized slowing indicating a mild to moderate cerebral disturbance (encephalopathy). Intermittent focal slowing is noted in the left posterior region indicating a potential seizure foci. 6/6 MRI brain with and without contrast;-New 11 x 9 x 11 mm rim enhancing lesion within the medial left occipital lobe with associated localized vasogenic edema.  -7 mm focus of nodular enhancement adjacent to the dilated body of the right lateral ventricle which is similar to previous exam. -Remote right MCA territory infarcts  6/9 EEG;abnormal EEG secondary to general background slowing  Culture   Antibiotics: Rocephin 6/6>>  DVT prophylaxis: SCD   Devices    LINES / TUBES:      Continuous Infusions: . dextrose 5 % and 0.45% NaCl      Objective: VITAL SIGNS: Temp: 98.3 F (36.8 C) (06/11 0609) Temp Source: Oral (06/11 0609) BP: 142/71 mmHg (06/11 0609) Pulse Rate: 69 (06/11 0609) SPO2; FIO2:  No intake or output data in the 24 hours ending 11/11/14 0905   Exam: General:will awaken and engage in conversation - slow Lungs: Clear to auscultation bilaterally without wheezes or crackles Cardiovascular: Regular rate and rhythm without murmur gallop or rub normal S1 and S2 Abdomen:negative abdominal pain, negative dysphagia, Nontender, nondistended, soft, bowel sounds positive, no rebound, no ascites, no appreciable mass Extremities: No significant cyanosis, clubbing, or edema bilateral lower extremities Neurologic:  Attempting to follow commands with right hand   Data  Reviewed: Basic Metabolic Panel:  Recent Labs Lab 11/06/14 0729  11/07/14 0217 11/08/14 0240 11/08/14 1140 11/09/14 0247 11/11/14 0350  NA 144 143  --  143 140 143  K 3.5 3.9  --  3.9 3.7 3.9  CL 117* 113*  --  117* 115* 116*  CO2 20* 21*  --  19* 15* 18*  GLUCOSE 96 122*  --  117* 109* 108*  BUN 26* 22*  --  28* 27* 26*  CREATININE 1.51* 1.24*  --  1.18* 1.18* 1.18*  CALCIUM 9.7 9.9  --  10.4* 9.7 9.9  MG  --   --  1.8  --   --   --    Liver Function Tests:  Recent Labs Lab 11/05/14 2212 11/06/14 0729 11/07/14 0217  AST 32 34 59*  ALT 24 25 43  ALKPHOS 80 65 75  BILITOT 0.8 0.7 0.6  PROT 5.9* 5.4* 6.1*  ALBUMIN 3.4* 3.1* 3.2*   No results for input(s): LIPASE, AMYLASE in the last 168 hours.  Recent Labs Lab 11/11/14 0350  AMMONIA 30   CBC:  Recent Labs Lab 11/05/14 2212  11/06/14 0729 11/07/14 0217 11/08/14 0240 11/09/14 0247 11/11/14 0350  WBC 4.3  --  3.3* 2.7* 4.2 5.0 3.6*  NEUTROABS 2.8  --   --   --  3.3  --   --   HGB 10.5*  < > 9.3* 10.1* 9.2* 8.8* 9.7*  HCT 32.7*  < > 29.3* 32.2* 29.3* 27.5* 29.9*  MCV 80.5  --  82.3 82.1 80.9 82.1 79.7  PLT 108*  --  98* 111* 124* 115* 98*  < > = values in this interval not displayed. Cardiac Enzymes: No results for input(s): CKTOTAL, CKMB, CKMBINDEX, TROPONINI in the last 168 hours. BNP (last 3 results) No results for input(s): BNP in the last 8760 hours.  ProBNP (last 3 results) No results for input(s): PROBNP in the last 8760 hours.  CBG:  Recent Labs Lab 11/09/14 1820 11/10/14 0059 11/10/14 0828 11/10/14 1629 11/11/14 0048  GLUCAP 103* 113* 107* 98 101*    Recent Results (from the past 240 hour(s))  Culture, blood (x 2)     Status: None (Preliminary result)   Collection Time: 11/06/14  4:35 AM  Result Value Ref Range Status   Specimen Description BLOOD LEFT ARM  Final   Special Requests BAA 5ML  Final   Culture   Final           BLOOD CULTURE RECEIVED NO GROWTH TO DATE CULTURE WILL BE HELD FOR 5 DAYS BEFORE ISSUING A FINAL NEGATIVE REPORT Performed at  [facility]    Report Status PENDING  Incomplete  Culture, blood (x 2)     Status: None (Preliminary result)   Collection Time: 11/06/14  7:30 AM  Result Value Ref Range Status   Specimen Description BLOOD LEFT ASSIST CONTROL  Final   Special Requests BAA 10ML  Final   Culture   Final           BLOOD CULTURE RECEIVED NO GROWTH TO DATE CULTURE WILL BE HELD FOR 5 DAYS BEFORE ISSUING A FINAL NEGATIVE REPORT Performed at [facility]    Report Status PENDING  Incomplete  Urine culture     Status: None   Collection Time: 11/06/14  7:50 AM  Result Value Ref Range Status   Specimen Description URINE, CLEAN CATCH  Final   Special Requests NONE  Final   Colony Count  Final    >=100,000 COLONIES/ML Performed at [place]   Final    ESCHERICHIA COLI Performed at [facility]    Report Status 11/08/2014 FINAL  Final   Organism ID, Bacteria ESCHERICHIA COLI  Final      Susceptibility   Escherichia coli - MIC*    AMPICILLIN >=32 RESISTANT Resistant     CEFAZOLIN <=4 SENSITIVE Sensitive     CEFTRIAXONE <=1 SENSITIVE Sensitive     CIPROFLOXACIN <=0.25 SENSITIVE Sensitive     GENTAMICIN <=1 SENSITIVE Sensitive     LEVOFLOXACIN <=0.12 SENSITIVE Sensitive     NITROFURANTOIN <=16 SENSITIVE Sensitive     TOBRAMYCIN <=1 SENSITIVE Sensitive     TRIMETH/SULFA <=20 SENSITIVE Sensitive     PIP/TAZO <=4 SENSITIVE Sensitive     * ESCHERICHIA COLI  MRSA PCR Screening     Status: None   Collection Time: 11/06/14  3:45 PM  Result Value Ref Range Status   MRSA by PCR NEGATIVE NEGATIVE Final    Comment:        The GeneXpert MRSA Assay (FDA approved for NASAL specimens only), is one component of a comprehensive MRSA colonization surveillance program. It is not intended to diagnose MRSA infection nor to guide or monitor treatment for MRSA infections.        Scheduled Meds:  Scheduled Meds: . antiseptic oral rinse  7 mL Mouth Rinse q12n4p  .  ARIPiprazole  7 mg Oral Daily  . cefTRIAXone (ROCEPHIN)  IV  1 g Intravenous Q24H  . chlorhexidine  15 mL Mouth Rinse BID  . dexamethasone  4 mg Intravenous 3 times per day  . hydrocortisone  1 application Rectal BID  . lacosamide  50 mg Oral BID  . lamoTRIgine  100 mg Oral Daily  . levETIRAcetam  500 mg Intravenous Q12H  . pantoprazole (PROTONIX) IV  40 mg Intravenous Q24H    Time spent on care of this patient: 25 mins   Eulogio Bear , DO  [facility] Office  [phone] Pager - [phone]  On-Call/Text Page:      [email or web address]      password [email or web address]  If 7PM-7AM, please contact night-coverage [email or web address] Password [email or web address] 11/11/2014, 9:05 AM   LOS: 5 days

## 2014-11-11 NOTE — Evaluation (Signed)
Clinical/Bedside Swallow Evaluation Patient Details  Name: [name] MRN: [id number] Date of Birth: [birth year]  Today's Date: 11/11/2014 Time: SLP Start Time (ACUTE ONLY): 1425 SLP Stop Time (ACUTE ONLY): 1440 SLP Time Calculation (min) (ACUTE ONLY): 15 min  Past Medical History:  Past Medical History  Diagnosis Date  . 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 Fracture, ankle 2012    right  . History of radiation therapy 09/23/10- 11/07/10    right parietal lobe  . Chronic headaches     uses oxycodone appropriately  . Anxiety   . Muscle spasticity   . Seizures   . Dysphagia   . GERD (gastroesophageal reflux disease)    Past Surgical History:  Past Surgical History  Procedure Laterality Date  . Orif ankle fracture  08/22/2010    right  . Radiology with anesthesia N/A 05/02/2013    Procedure: RADIOLOGY WITH ANESTHESIA;  Surgeon: Carla Lose, MD;  Location: [facility];  Service: Radiology;  Laterality: N/A;  . 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 Admitted with altered mental status. MRI shows new 11 x 9 x 11 mm rim enhancing lesion within the medial left occipital lobe.  Pt has been NPO since admission.  Swallow eval ordered 6/6 then canceled.     Assessment / Plan / Recommendation Clinical Impression  Pt presents with a likely mild dysphagia with adequate toleration of purees and thin liquids with no overt s/s of aspiration.  Requires hand-over-hand assist to drink from a cup.  Appears from records to have been hand fed while at SNF.  Pt with sufficient oral manipulation of purees, consistent swallow response, and no coughing/throat clearing after PO consumption.  Pt able to speak in short sentences, but speech and swallow motor responses are delayed.  Recommend initiating a dysphagia 1 diet with thin liquids for now; meds whole in puree;  pt needs  assistance with self-feeding - please allow her to feed herself as much as she is able.  SLP will follow for diet advancement/toleration.  D/W RN.      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 Admitted with altered mental status. MRI shows new 11 x 9 x 11 mm rim enhancing lesion within the medial left occipital lobe.  Pt has been NPO since admission.  Swallow eval ordered 6/6 then canceled.   Type of Study: Bedside swallow evaluation Previous Swallow Assessment: 2014 admission was followed by SLP services for swallowing/cognition Diet Prior to this Study: NPO Temperature Spikes Noted: No Respiratory Status: Room air History of Recent Intubation: No Behavior/Cognition: Alert;Cooperative Oral Cavity - Dentition: Adequate natural dentition/normal for age;Poor condition Self-Feeding Abilities: Needs assist (able to use RUE to hold cup with hand-over-hand assistance) Patient Positioning: Upright in bed Baseline Vocal Quality: Normal Volitional Cough: Strong Volitional Swallow: Able to elicit    Oral/Motor/Sensory  Function Overall Oral Motor/Sensory Function: Other (comment) (mild asymmetry left CN VII; poor bilateral tongue extension)   Ice Chips Ice chips: Within functional limits Presentation: Spoon   Thin Liquid Thin Liquid: Within functional limits Presentation: Cup    Nectar Thick Nectar Thick Liquid: Not tested   Honey Thick Honey Thick Liquid: Not tested   Puree Puree: Within functional limits Presentation: [name], [name]     Solid: Not tested       [name] 11/11/2014,3:50 PM

## 2014-11-12 ENCOUNTER — Ambulatory Visit: Payer: Medicare Other | Admitting: Radiation Oncology

## 2014-11-12 DIAGNOSIS — Z9221 Personal history of antineoplastic chemotherapy: Secondary | ICD-10-CM | POA: Insufficient documentation

## 2014-11-12 DIAGNOSIS — Z79899 Other long term (current) drug therapy: Secondary | ICD-10-CM | POA: Insufficient documentation

## 2014-11-12 DIAGNOSIS — Z7982 Long term (current) use of aspirin: Secondary | ICD-10-CM | POA: Insufficient documentation

## 2014-11-12 DIAGNOSIS — Z8673 Personal history of transient ischemic attack (TIA), and cerebral infarction without residual deficits: Secondary | ICD-10-CM | POA: Insufficient documentation

## 2014-11-12 DIAGNOSIS — C713 Malignant neoplasm of parietal lobe: Secondary | ICD-10-CM | POA: Insufficient documentation

## 2014-11-12 DIAGNOSIS — R110 Nausea: Secondary | ICD-10-CM | POA: Insufficient documentation

## 2014-11-12 DIAGNOSIS — H538 Other visual disturbances: Secondary | ICD-10-CM | POA: Insufficient documentation

## 2014-11-12 DIAGNOSIS — Z923 Personal history of irradiation: Secondary | ICD-10-CM | POA: Insufficient documentation

## 2014-11-12 DIAGNOSIS — H9191 Unspecified hearing loss, right ear: Secondary | ICD-10-CM | POA: Insufficient documentation

## 2014-11-12 LAB — CULTURE, BLOOD (ROUTINE X 2)
Culture: NO GROWTH
Culture: NO GROWTH

## 2014-11-12 LAB — GLUCOSE, CAPILLARY
Glucose-Capillary: 139 mg/dL — ABNORMAL HIGH (ref 65–99)
Glucose-Capillary: 149 mg/dL — ABNORMAL HIGH (ref 65–99)
Glucose-Capillary: 161 mg/dL — ABNORMAL HIGH (ref 65–99)

## 2014-11-12 MED ORDER — FLUOXETINE HCL 20 MG PO CAPS
60.0000 mg | ORAL_CAPSULE | Freq: Every day | ORAL | Status: DC
  Administered 2014-11-12 – 2014-11-16 (×4): 60 mg via ORAL
  Filled 2014-11-12 (×5): qty 3

## 2014-11-12 MED ORDER — PANTOPRAZOLE SODIUM 40 MG PO TBEC
40.0000 mg | DELAYED_RELEASE_TABLET | Freq: Every day | ORAL | Status: DC
  Administered 2014-11-12 – 2014-11-16 (×4): 40 mg via ORAL
  Filled 2014-11-12 (×5): qty 1

## 2014-11-12 MED ORDER — DEXAMETHASONE 4 MG PO TABS
4.0000 mg | ORAL_TABLET | Freq: Two times a day (BID) | ORAL | Status: DC
  Administered 2014-11-12 – 2014-11-16 (×9): 4 mg via ORAL
  Filled 2014-11-12 (×9): qty 1

## 2014-11-12 MED ORDER — ASPIRIN EC 325 MG PO TBEC
325.0000 mg | DELAYED_RELEASE_TABLET | Freq: Every day | ORAL | Status: DC
  Administered 2014-11-12 – 2014-11-16 (×4): 325 mg via ORAL
  Filled 2014-11-12 (×5): qty 1

## 2014-11-12 MED ORDER — GABAPENTIN 300 MG PO CAPS
300.0000 mg | ORAL_CAPSULE | Freq: Three times a day (TID) | ORAL | Status: DC
  Administered 2014-11-12 – 2014-11-14 (×8): 300 mg via ORAL
  Filled 2014-11-12 (×9): qty 1

## 2014-11-12 MED ORDER — ATORVASTATIN CALCIUM 10 MG PO TABS
10.0000 mg | ORAL_TABLET | Freq: Every day | ORAL | Status: DC
  Administered 2014-11-12 – 2014-11-16 (×4): 10 mg via ORAL
  Filled 2014-11-12 (×5): qty 1

## 2014-11-12 MED ORDER — LEVETIRACETAM 500 MG PO TABS
500.0000 mg | ORAL_TABLET | Freq: Two times a day (BID) | ORAL | Status: DC
  Administered 2014-11-12 – 2014-11-16 (×8): 500 mg via ORAL
  Filled 2014-11-12 (×9): qty 1

## 2014-11-12 NOTE — Progress Notes (Addendum)
Subjective: Patient awake and alert this morning.  More appropriate with conversation.  No further seizures noted.     Objective: Current vital signs: BP 143/81 mmHg  Pulse 68  Temp(Src) 98.1 F (36.7 C) (Oral)  Resp 18  Ht 5\' 9"  (1.753 m)  Wt 71.759 kg (158 lb 3.2 oz)  BMI 23.35 kg/m2  SpO2 99%  LMP 08/26/2010 Vital signs in last 24 hours: Temp:  [97.9 F (36.6 C)-98.5 F (36.9 C)] 98.1 F (36.7 C) (06/12 0550) Pulse Rate:  [65-79] 68 (06/12 0550) Resp:  [16-20] 18 (06/12 0550) BP: (131-154)/(65-85) 143/81 mmHg (06/12 0550) SpO2:  [97 %-100 %] 99 % (06/12 0550)  Intake/Output from previous day: 06/11 0701 - 06/12 0700 In: 240 [P.O.:240] Out: -  Intake/Output this shift: Total I/O In: 240 [P.O.:240] Out: -  Nutritional status: DIET - DYS 1 Room service appropriate?: Yes; Fluid consistency:: Thin  Neurologic Exam: Mental Status:  Awake and alert. Knows she is in [place].  Follows commands. Conversation more directed and answers appropriate to questions being asked.  Cranial Nerves:  II: Blinks to threat bilaterally, pupils equal, round, reactive to light and accommodation  III,IV, VI: Corneal reflex intact.  V,VII: left facial droop, facial light touch sensation normal bilaterally  VIII: hearing normal bilaterally  IX,X: uvula rises symmetrically  XI: bilateral shoulder shrug  XII: midline tongue extension without atrophy or fasciculations  Motor:  Right : Upper extremity 4/5  Left: Upper extremity 0/5 held in flexion contracture  Lower extremity 3/5 Lower extremity 2/5 flexes knee to pain  Right arm and leg tremor noted but will stop when I place my hand on arm or leg.    Lab Results: Basic Metabolic Panel:  Recent Labs Lab 11/06/14 0729 11/07/14 0217 11/08/14 0240 11/08/14 1140 11/09/14 0247 11/11/14 0350   NA 144 143  --  143 140 143  K 3.5 3.9  --  3.9 3.7 3.9  CL 117* 113*  --  117* 115* 116*  CO2 20* 21*  --  19* 15* 18*  GLUCOSE 96 122*  --  117* 109* 108*  BUN 26* 22*  --  28* 27* 26*  CREATININE 1.51* 1.24*  --  1.18* 1.18* 1.18*  CALCIUM 9.7 9.9  --  10.4* 9.7 9.9  MG  --   --  1.8  --   --   --     Liver Function Tests:  Recent Labs Lab 11/05/14 2212 11/06/14 0729 11/07/14 0217  AST 32 34 59*  ALT 24 25 43  ALKPHOS 80 65 75  BILITOT 0.8 0.7 0.6  PROT 5.9* 5.4* 6.1*  ALBUMIN 3.4* 3.1* 3.2*   No results for input(s): LIPASE, AMYLASE in the last 168 hours.  Recent Labs Lab 11/11/14 0350  AMMONIA 30    CBC:  Recent Labs Lab 11/05/14 2212  11/06/14 0729 11/07/14 0217 11/08/14 0240 11/09/14 0247 11/11/14 0350  WBC 4.3  --  3.3* 2.7* 4.2 5.0 3.6*  NEUTROABS 2.8  --   --   --  3.3  --   --   HGB 10.5*  < > 9.3* 10.1* 9.2* 8.8* 9.7*  HCT 32.7*  < > 29.3* 32.2* 29.3* 27.5* 29.9*  MCV 80.5  --  82.3 82.1 80.9 82.1 79.7  PLT 108*  --  98* 111* 124* 115* 98*  < > = values in this interval not displayed.  Cardiac Enzymes: No results for input(s): CKTOTAL, CKMB, CKMBINDEX, TROPONINI in the last 168 hours.  Lipid  Panel:  Recent Labs Lab 11/08/14 0240  CHOL 125  TRIG 40  HDL 54  CHOLHDL 2.3  VLDL 8  LDLCALC 63    CBG:  Recent Labs Lab 11/10/14 0828 11/10/14 1629 11/11/14 0048 11/11/14 1614 11/12/14  GLUCAP 107* 98 101* 117* 149*    Microbiology: Results for orders placed or performed during the hospital encounter of 11/05/14  Culture, blood (x 2)     Status: None   Collection Time: 11/06/14  4:35 AM  Result Value Ref Range Status   Specimen Description BLOOD LEFT ARM  Final   Special Requests BAA 5ML  Final   Culture   Final    NO GROWTH 5 DAYS Performed at [facility]    Report Status 11/12/2014 FINAL  Final  Culture, blood (x 2)     Status: None   Collection Time: 11/06/14  7:30 AM  Result Value Ref Range Status   Specimen  Description BLOOD LEFT ASSIST CONTROL  Final   Special Requests BAA 10ML  Final   Culture   Final    NO GROWTH 5 DAYS Note: Culture results may be compromised due to an excessive volume of blood received in culture bottles. Performed at [facility]    Report Status 11/12/2014 FINAL  Final  Urine culture     Status: None   Collection Time: 11/06/14  7:50 AM  Result Value Ref Range Status   Specimen Description URINE, CLEAN CATCH  Final   Special Requests NONE  Final   Colony Count   Final    >=100,000 COLONIES/ML Performed at [facility]    Culture   Final    ESCHERICHIA COLI Performed at [facility]    Report Status 11/08/2014 FINAL  Final   Organism ID, Bacteria ESCHERICHIA COLI  Final      Susceptibility   Escherichia coli - MIC*    AMPICILLIN >=32 RESISTANT Resistant     CEFAZOLIN <=4 SENSITIVE Sensitive     CEFTRIAXONE <=1 SENSITIVE Sensitive     CIPROFLOXACIN <=0.25 SENSITIVE Sensitive     GENTAMICIN <=1 SENSITIVE Sensitive     LEVOFLOXACIN <=0.12 SENSITIVE Sensitive     NITROFURANTOIN <=16 SENSITIVE Sensitive     TOBRAMYCIN <=1 SENSITIVE Sensitive     TRIMETH/SULFA <=20 SENSITIVE Sensitive     PIP/TAZO <=4 SENSITIVE Sensitive     * ESCHERICHIA COLI  MRSA PCR Screening     Status: None   Collection Time: 11/06/14  3:45 PM  Result Value Ref Range Status   MRSA by PCR NEGATIVE NEGATIVE Final    Comment:        The GeneXpert MRSA Assay (FDA approved for NASAL specimens only), is one component of a comprehensive MRSA colonization surveillance program. It is not intended to diagnose MRSA infection nor to guide or monitor treatment for MRSA infections.     Coagulation Studies: No results for input(s): LABPROT, INR in the last 72 hours.  Imaging: No results found.  Medications:  I have reviewed the patient's current medications. Scheduled: . antiseptic oral rinse  7 mL Mouth Rinse q12n4p  . ARIPiprazole  7 mg Oral Daily  .  cefTRIAXone (ROCEPHIN)  IV  1 g Intravenous Q24H  . chlorhexidine  15 mL Mouth Rinse BID  . dexamethasone  4 mg Intravenous 3 times per day  . hydrocortisone  1 application Rectal BID  . lacosamide  50 mg Oral BID  . lamoTRIgine  100 mg Oral Daily  .  levETIRAcetam  500 mg Intravenous Q12H  . pantoprazole (PROTONIX) IV  40 mg Intravenous Q24H    Assessment/Plan: Patient improving.  No further seizures noted.  Patient remains on Keppra and Vimpat.  No further testing recommended at this time. New lesion on MRI testing small and not likely cause of mental status changes.  Remains on steroids.  Recommendations: 1.  Continue Lamictal and Vimpat at current po doses 2.  Change Keppra to po but continue at 500mg  BID 3.  Decrease steroids and change to po.  Decrease to 4mg  BID and continue with taper of 2mg  BID for 5 days then 2 mg daily for 5 days prior to discontinuation.  Patient to have lesion followed up on an outpatient basis with follow up imaging.  Neurosurgery following patient as well.     LOS: 6 days   Alexis Goodell, MD [facility] [phone] 11/12/2014  8:46 AM

## 2014-11-12 NOTE — Progress Notes (Signed)
Progress Note  [name] TIW:[id number] DOB: 02-Nov-1960 DOA: 11/05/2014 PCP: [name], DO  Admit HPI / Brief Narrative: 54 y.o. 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  Presents with worsening mental status and jerking. She was brought in via EMS from her nursing home due to worsening mental status. At baseline, she could say ' yes or no" to questioning. But since Saturday, she could not interact with staff as she normally did. She seems to be more confused.   In ED, patient was found to have WBC 4.3, temperature 99.7, no tachycardia, INR 1.2, PTT 29, positive urinalysis, troponin negative, AoCKD-III.  Assessment/Plan: Acute encephalopathy -Likely toxic metabolic encephalopathy related to Escherichia coli pyelonephritis +/- contribution from brain mass Improved. ?at baseline?  New 11 x 9 x 11 mm rim enhancing lesion within the medial left occipital lobe/ Hx of astrocytoma -Dexamethasone taper per neuro neurosurgery following -per  NS recs asked rad/onc. Per Dr. Eliseo Squires, Dr. [name] to see this weekend. Await recs  Dysphagia?? Started on D1 diet. Resume outpatient meds  Left spastic hemiparesis Previous astrocytoma/XRT/resection  UTI Day 6 abx. Will d/c rocephin  Seizure -EEG ok -Continue Vimpat 50 mg BID; -Continue Lamictal 100 mg daily -Continue Keppra 500 mg BID  HLD  History of stroke  Acute on CKD stage 3 improved  GERD  Depression -Continue Abilify 7 mg daily  Pancytopenia Likely infection related. stable   Code Status: FULL Family Communication:  Disposition Plan: SNF- from [facility]: [facility] Rad onc consult pending  Procedure/Significant Events: 6/6 EEG;-Abnormal EEG due to generalized slowing indicating a mild to moderate cerebral  disturbance (encephalopathy). Intermittent focal slowing is noted in the left posterior region indicating a potential seizure foci. 6/6 MRI brain with and without contrast;-New 11 x 9 x 11 mm rim enhancing lesion within the medial left occipital lobe with associated localized vasogenic edema.  -7 mm focus of nodular enhancement adjacent to the dilated body of the right lateral ventricle which is similar to previous exam. -Remote right MCA territory infarcts  6/9 EEG;abnormal EEG secondary to general background slowing  Antibiotics: Rocephin 6/6>>6/12  Continuous Infusions: . dextrose 5 % and 0.45% NaCl 50 mL/hr at 11/11/14 2336   HPI/Subjective: "im fine"  Objective: VITAL SIGNS: Temp: 98 F (36.7 C) (06/12 0857) Temp Source: Oral (06/12 0857) BP: 136/84 mmHg (06/12 0857) Pulse Rate: 73 (06/12 0857) SPO2; FIO2:   Intake/Output Summary (Last 24 hours) at 11/12/14 0946 Last data filed at 11/12/14 0753  Gross per 24 hour  Intake    480 ml  Output      0 ml  Net    480 ml     Exam: General: alert. Answers questions appropriately Lungs: Clear to auscultation bilaterally without wheezes or crackles Cardiovascular: Regular rate and rhythm without murmur gallop or rub normal S1 and S2 Abdomen: s, nt, nd Extremities: No significant cyanosis, clubbing, or edema bilateral lower extremities  Data Reviewed: Basic Metabolic Panel:  Recent Labs Lab 11/06/14 0729 11/07/14 0217 11/08/14 0240 11/08/14 1140 11/09/14 0247 11/11/14 0350  NA 144 143  --  143 140 143  K 3.5 3.9  --  3.9 3.7 3.9  CL 117* 113*  --  117* 115* 116*  CO2 20* 21*  --  19* 15* 18*  GLUCOSE 96 122*  --  117* 109* 108*  BUN 26* 22*  --  28* 27* 26*  CREATININE 1.51* 1.24*  --  1.18* 1.18* 1.18*  CALCIUM 9.7 9.9  --  10.4* 9.7 9.9  MG  --   --  1.8  --   --   --    Liver Function Tests:  Recent Labs Lab 11/05/14 2212 11/06/14 0729 11/07/14 0217  AST 32 34 59*  ALT 24 25 43  ALKPHOS 80 65 75    BILITOT 0.8 0.7 0.6  PROT 5.9* 5.4* 6.1*  ALBUMIN 3.4* 3.1* 3.2*   No results for input(s): LIPASE, AMYLASE in the last 168 hours.  Recent Labs Lab 11/11/14 0350  AMMONIA 30   CBC:  Recent Labs Lab 11/05/14 2212  11/06/14 0729 11/07/14 0217 11/08/14 0240 11/09/14 0247 11/11/14 0350  WBC 4.3  --  3.3* 2.7* 4.2 5.0 3.6*  NEUTROABS 2.8  --   --   --  3.3  --   --   HGB 10.5*  < > 9.3* 10.1* 9.2* 8.8* 9.7*  HCT 32.7*  < > 29.3* 32.2* 29.3* 27.5* 29.9*  MCV 80.5  --  82.3 82.1 80.9 82.1 79.7  PLT 108*  --  98* 111* 124* 115* 98*  < > = values in this interval not displayed. Cardiac Enzymes: No results for input(s): CKTOTAL, CKMB, CKMBINDEX, TROPONINI in the last 168 hours. BNP (last 3 results) No results for input(s): BNP in the last 8760 hours.  ProBNP (last 3 results) No results for input(s): PROBNP in the last 8760 hours.  CBG:  Recent Labs Lab 11/10/14 0828 11/10/14 1629 11/11/14 0048 11/11/14 1614 11/12/14  GLUCAP 107* 98 101* 117* 149*    Recent Results (from the past 240 hour(s))  Culture, blood (x 2)     Status: None   Collection Time: 11/06/14  4:35 AM  Result Value Ref Range Status   Specimen Description BLOOD LEFT ARM  Final   Special Requests BAA 5ML  Final   Culture   Final    NO GROWTH 5 DAYS Performed at [facility]    Report Status 11/12/2014 FINAL  Final  Culture, blood (x 2)     Status: None   Collection Time: 11/06/14  7:30 AM  Result Value Ref Range Status   Specimen Description BLOOD LEFT ASSIST CONTROL  Final   Special Requests BAA 10ML  Final   Culture   Final    NO GROWTH 5 DAYS Note: Culture results may be compromised due to an excessive volume of blood received in culture bottles. Performed at [facility]    Report Status 11/12/2014 FINAL  Final  Urine culture     Status: None   Collection Time: 11/06/14  7:50 AM  Result Value Ref Range Status   Specimen Description URINE, CLEAN CATCH  Final   Special  Requests NONE  Final   Colony Count   Final    >=100,000 COLONIES/ML Performed at [facility]    Culture   Final    ESCHERICHIA COLI Performed at [facility]    Report Status 11/08/2014 FINAL  Final   Organism ID, Bacteria ESCHERICHIA COLI  Final      Susceptibility   Escherichia coli - MIC*    AMPICILLIN >=32 RESISTANT Resistant     CEFAZOLIN <=4 SENSITIVE Sensitive     CEFTRIAXONE <=1 SENSITIVE Sensitive     CIPROFLOXACIN <=0.25 SENSITIVE Sensitive     GENTAMICIN <=1 SENSITIVE Sensitive     LEVOFLOXACIN <=  0.12 SENSITIVE Sensitive     NITROFURANTOIN <=16 SENSITIVE Sensitive     TOBRAMYCIN <=1 SENSITIVE Sensitive     TRIMETH/SULFA <=20 SENSITIVE Sensitive     PIP/TAZO <=4 SENSITIVE Sensitive     * ESCHERICHIA COLI  MRSA PCR Screening     Status: None   Collection Time: 11/06/14  3:45 PM  Result Value Ref Range Status   MRSA by PCR NEGATIVE NEGATIVE Final    Comment:        The GeneXpert MRSA Assay (FDA approved for NASAL specimens only), is one component of a comprehensive MRSA colonization surveillance program. It is not intended to diagnose MRSA infection nor to guide or monitor treatment for MRSA infections.        Scheduled Meds:  Scheduled Meds: . antiseptic oral rinse  7 mL Mouth Rinse q12n4p  . ARIPiprazole  7 mg Oral Daily  . cefTRIAXone (ROCEPHIN)  IV  1 g Intravenous Q24H  . chlorhexidine  15 mL Mouth Rinse BID  . dexamethasone  4 mg Oral BID  . hydrocortisone  1 application Rectal BID  . lacosamide  50 mg Oral BID  . lamoTRIgine  100 mg Oral Daily  . levETIRAcetam  500 mg Oral BID  . pantoprazole (PROTONIX) IV  40 mg Intravenous Q24H    Time spent on care of this patient: 25 mins  Delfina Redwood , MD  [facility] Pager [phone]  Text Page:      [email or web address]      password [facility]  11/12/2014, 9:46 AM   LOS: 6 days

## 2014-11-12 NOTE — Consult Note (Signed)
Radiation Oncology         [phone]) [phone] ________________________________  Established Inpatient Consultation  Name: [name] MRN: [id number]  Date: 11/05/2014  DOB: [birth year]  UK:GURKYH, Monica, DO  No ref. provider found   REFERRING PHYSICIAN: Dr. 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 female who was treated for anaplastic astrocytoma 4 years ago with good local control, who unfortunately suffered a debilitating stroke therafter.  Her baseline performance status and social circumstances have declined in recent months/years.  She has resided in a skilled nursing facility and was transported and admitted on 11/08/14 with acute altered mental status and pain.  Brain MRI on 11/06/14 showed a new left sided 11 mm rim enhancing lesion suggestive of malignancy.      This would be an unusual presentation of recurrent astrocytoma in terms of location and timing.  So, CT scans of the body were done to assess for a new primary cancer with solitary brain metastasis.  PREVIOUS RADIATION THERAPY: Yes as above  PAST MEDICAL HISTORY:  has a past medical history of Astrocytoma brain tumor (04/08/2011); Fracture, ankle (2012); History of radiation therapy (09/23/10- 11/07/10); Chronic headaches; Anxiety; Muscle spasticity; Seizures; Dysphagia; and GERD (gastroesophageal reflux disease).    PAST SURGICAL HISTORY: Past Surgical History  Procedure Laterality Date  . Orif ankle fracture  08/22/2010    right  . Radiology with anesthesia N/A 05/02/2013    Procedure: RADIOLOGY WITH ANESTHESIA;  Surgeon: Consuella Lose, MD;  Location: [facility];  Service: Radiology;   Laterality: N/A;  . Brain surgery Right 2012    astrocytoma R parietal lobe    FAMILY HISTORY: family history includes Breast cancer in her mother.  SOCIAL HISTORY:  reports that she has never smoked. She has never used smokeless tobacco. She reports that she drinks alcohol. She reports that she does not use illicit drugs.  ALLERGIES: Vasotec  MEDICATIONS:  Current Facility-Administered Medications  Medication Dose Route Frequency Provider Last Rate Last Dose  . acetaminophen (TYLENOL) suppository 650 mg  650 mg Rectal Q6H PRN Ivor Costa, MD      . antiseptic oral rinse (CPC / CETYLPYRIDINIUM CHLORIDE 0.05%) solution 7 mL  7 mL Mouth Rinse q12n4p Cherene Altes, MD   7 mL at 11/12/14 1613  . ARIPiprazole (ABILIFY) tablet 7 mg  7 mg Oral Daily Ivor Costa, MD   7 mg at 11/12/14 1030  . aspirin EC tablet 325 mg  325 mg Oral Daily Delfina Redwood, MD   325 mg at 11/12/14 1034  . atorvastatin (LIPITOR) tablet 10 mg  10 mg Oral Daily Delfina Redwood, MD   10 mg at 11/12/14 1033  . chlorhexidine (PERIDEX) 0.12 % solution 15 mL  15 mL Mouth Rinse BID Cherene Altes, MD   15 mL at 11/12/14 1813  . dexamethasone (DECADRON) tablet 4 mg  4 mg Oral BID Alexis Goodell, MD   4 mg at 11/12/14 1030  . FLUoxetine (PROZAC) capsule 60 mg  60 mg Oral Daily Delfina Redwood, MD   60 mg at 11/12/14 1034  . gabapentin (NEURONTIN) capsule 300 mg  300 mg Oral TID Corinna L  Conley Canal, MD   300 mg at 11/12/14 1827  . hydrocortisone (ANUSOL-HC) 2.5 % rectal cream 1 application  1 application Rectal BID Ivor Costa, MD   1 application at 66/44/03 1035  . lacosamide (VIMPAT) tablet 50 mg  50 mg Oral BID Ivor Costa, MD   50 mg at 11/12/14 1030  . lamoTRIgine (LAMICTAL) tablet 100 mg  100 mg Oral Daily Ivor Costa, MD   100 mg at 11/12/14 1030  . levETIRAcetam (KEPPRA) tablet 500 mg  500 mg Oral BID Alexis Goodell, MD   500 mg at 11/12/14 1030  . pantoprazole (PROTONIX) EC tablet 40 mg  40 mg Oral Daily Delfina Redwood, MD   40 mg at 11/12/14 1033    REVIEW OF SYSTEMS:  A 15 point review of systems is documented in the electronic medical record. This was obtained by the nursing staff. However, I reviewed this with the patient to discuss relevant findings and make appropriate changes.  Pertinent items are noted in HPI.   PHYSICAL EXAM:  height is 5\' 9"  (1.753 m) and weight is 158 lb 3.2 oz (71.759 kg). Her axillary temperature is 98.6 F (37 C). Her blood pressure is 118/63 and her pulse is 58. Her respiration is 14 and oxygen saturation is 95%.   Per hospitalists  General: alert but slow to answer with flat affect. Answers questions somewhat appropriately, but, thinks she was admitted for another fall.  When asked if she knew what her new MRI shows, she said 'no growth of tumor.' Lungs: Clear to auscultation bilaterally without wheezes or crackles Cardiovascular: Regular rate and rhythm without murmur gallop or rub normal S1 and S2 Abdomen: s, nt, nd Extremities: No significant cyanosis, clubbing, or edema bilateral lower extremities   KPS = 40  100 - Normal; no complaints; no evidence of disease. 90   - Able to carry on normal activity; minor signs or symptoms of disease. 80   - Normal activity with effort; some signs or symptoms of disease. 37   - Cares for self; unable to carry on normal activity or to do active work. 60   - Requires occasional assistance, but is able to care for most of his personal needs. 50   - Requires considerable assistance and frequent medical care. 59   - Disabled; requires special care and assistance. 77   - Severely disabled; hospital admission is indicated although death not imminent. 43   - Very sick; hospital admission necessary; active supportive treatment necessary. 10   - Moribund; fatal processes progressing rapidly. 0     - Dead  Karnofsky DA, Abelmann [place], Craver LS and Burchenal [facility] [phone]) The use of the nitrogen mustards in the palliative treatment of  carcinoma: with particular reference to bronchogenic carcinoma Cancer 1 634-56  LABORATORY DATA:  Lab Results  Component Value Date   WBC 3.6* 11/11/2014   HGB 9.7* 11/11/2014   HCT 29.9* 11/11/2014   MCV 79.7 11/11/2014   PLT 98* 11/11/2014   Lab Results  Component Value Date   NA 143 11/11/2014   K 3.9 11/11/2014   CL 116* 11/11/2014   CO2 18* 11/11/2014   Lab Results  Component Value Date   ALT 43 11/07/2014   AST 59* 11/07/2014   ALKPHOS 75 11/07/2014   BILITOT 0.6 11/07/2014     RADIOGRAPHY: Ct Head Wo Contrast  11/05/2014   CLINICAL DATA:  Altered mental status.  EXAM: CT HEAD WITHOUT CONTRAST  TECHNIQUE: Contiguous axial images  were obtained from the base of the skull through the vertex without intravenous contrast.  COMPARISON:  Multiple priors.  Most recent MR 07/28/2014.  FINDINGS: Surgical encephalomalacia RIGHT parietal lobe. Post treatment changes with hypoattenuation of the cortex and white matter throughout the RIGHT hemisphere. Slight thickening of the posterior aspect to the resection cavity, may be slightly involuted compared with priors.  Slight hyperattenuation of a 5 mm area of medial LEFT occipital lobe gyrus as seen on 18 and 19, not clearly present on priors. There may be slight associated occipital vasogenic surrounding edema. Recurrent disease not excluded on this noncontrast scan.  Calvarium intact. No acute findings in the orbits, sinuses, or mastoids.  IMPRESSION: Stable to slightly improved changes throughout the RIGHT hemisphere, reflecting previous surgery and radiation.  Possible new finding medial LEFT occipital lobe, slight hyperattenuation and possible associated vasogenic edema. Recurrent disease not excluded. Further evaluation recommended with MRI brain without and with contrast when the patient is stable.   Electronically Signed   By: Rolla Flatten M.D.   On: 11/05/2014 23:12   Ct Chest W Contrast  11/08/2014   CLINICAL DATA:  New brain lesion.  Evaluation for metastatic disease. Abdominal distention.  EXAM: CT CHEST, ABDOMEN, AND PELVIS WITH CONTRAST  TECHNIQUE: Multidetector CT imaging of the chest, abdomen and pelvis was performed following the standard protocol during bolus administration of intravenous contrast.  CONTRAST:  22mL OMNIPAQUE IOHEXOL 300 MG/ML  SOLN  COMPARISON:  08/15/2010  FINDINGS: CT CHEST FINDINGS  No enlarged axillary, mediastinal, or hilar lymph nodes are identified. Pulmonary arterial enlargement is similar to the prior study, with the main pulmonary artery measuring approximately 3.7 cm in diameter and suggestive of underlying pulmonary arterial hypertension. The heart is mildly enlarged. There is a small right pleural effusion.  Evaluation of the lung parenchyma is mildly limited by motion artifact. Opacities in the dependent portions of the lower greater than upper lobes bilaterally are favored to represent atelectasis. Major airways are patent. No lung mass or nodules are identified. No lytic or blastic osseous lesions are identified.  CT ABDOMEN AND PELVIS FINDINGS  Evaluation of the upper abdomen is mildly limited by motion artifact. The liver, gallbladder, spleen, adrenal glands, and pancreas are grossly unremarkable. No renal mass or hydronephrosis is identified. A few punctate foci of hyperattenuation in the renal sinuses bilaterally may represent tiny nonobstructing calculi.  Oral contrast is present in multiple loops of nondilated small bowel without evidence of obstruction. There is a moderate amount of stool in the ascending and descending colon. There is mild gaseous distension of the transverse colon to approximately 5.5 cm in diameter, with mild gaseous distention of a portion of the sigmoid colon as well. There is a moderate amount of liquid stool in the distal sigmoid colon and rectum. No gross bowel wall thickening is identified.  Bladder is moderately distended. Uterus is identified. No pelvic mass is seen. Mild  atherosclerotic vascular calcification is noted. No free fluid or enlarged lymph nodes are identified. No suspicious lytic or blastic osseous lesions are identified. Facet arthrosis is noted in the lower lumbar spine, severe on the right at L5-S1 with facet joint ankylosis.  IMPRESSION: 1. No evidence of malignancy in the chest, abdomen, or pelvis. 2. Trace right pleural effusion. 3. Dependent opacities in both lungs, favored to represent atelectasis. 4. No evidence of bowel obstruction. Moderate amount of colonic stool, including liquid stool in the distal colon and rectum. Mild gaseous distention of the transverse colon.  Electronically Signed   By: Logan Bores   On: 11/08/2014 17:07   Mr Brain W Wo Contrast  11/06/2014   CLINICAL DATA:  Initial evaluation for altered mental status. History of prior right parietal brain tumor.  EXAM: MRI HEAD WITHOUT AND WITH CONTRAST  TECHNIQUE: Multiplanar, multiecho pulse sequences of the brain and surrounding structures were obtained without and with intravenous contrast.  CONTRAST:  52mL MULTIHANCE GADOBENATE DIMEGLUMINE 529 MG/ML IV SOLN  COMPARISON:  Prior CT from 11/05/2014 as well as previous MRI from 07/28/2014.  FINDINGS: Study is moderately degraded by motion artifact.  The right parietal resection cavity is similar in size relative to previous study. Restricted diffusion at the superior aspect of the cavity continues to diminish. Layering hematocrit level within the resection cavity again noted. Irregular enhancement along the superior margin of the resection cavity is grossly stable. Additionally, 7 mm focus of nodular enhancement within the periventricular white matter adjacent to the dilated body of the right lateral ventricles also grossly similar (series 16, image 32).  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  Dural thickening with enhancement overlying the right cerebral convexity is similar. Extensive T2 changes  throughout the right hemisphere are similar to previous study as well.  Ex vacuo dilatation of the right lateral ventricle again noted, stable. Slight left-to-right midline shift also unchanged. Mild periventricular white matter changes on the left are similar.  Normal intravascular flow voids are maintained.  No acute infarct.  There is a new heterogeneous Truman Hayward enhancing lesion within the medial left occipital lobe measuring 11 x 9 x 11 mm (AP by transverse by craniocaudad). There is localized vasogenic edema without significant mass effect.  Craniocervical junction within normal limits. Pituitary gland normal.  No acute abnormality about the orbits.  Mild mucosal thickening present within the max O sinuses and ethmoidal air cells.  Mastoid air cells are clear.  Inner ear structures normal.  IMPRESSION: 1. New 11 x 9 x 11 mm rim enhancing lesion within the medial left occipital lobe with associated localized vasogenic edema. Finding is worrisome for possible recurrent disease, although this would be somewhat unusual to reoccur in the contralateral cerebral hemisphere for a tumor that usually demonstrates local recurrence. Metastatic disease is also in the differential, although no other new lesions are identified. There is a 7 mm focus of nodular enhancement adjacent to the dilated body of the right lateral ventricle which is similar to previous exam. 2. Grossly stable postoperative changes within the right parietal lobe with persistent hematocrit level and some enhancement along the superior margin of the resection cavity. 3. Remote right MCA territory infarcts surrounding the resection cavity with associated laminar necrosis.   Electronically Signed   By: Jeannine Boga M.D.   On: 11/06/2014 06:52   Ct Abdomen Pelvis W Contrast  11/08/2014   CLINICAL DATA:  New brain lesion. Evaluation for metastatic disease. Abdominal distention.  EXAM: CT CHEST, ABDOMEN, AND PELVIS WITH CONTRAST  TECHNIQUE: Multidetector  CT imaging of the chest, abdomen and pelvis was performed following the standard protocol during bolus administration of intravenous contrast.  CONTRAST:  94mL OMNIPAQUE IOHEXOL 300 MG/ML  SOLN  COMPARISON:  08/15/2010  FINDINGS: CT CHEST FINDINGS  No enlarged axillary, mediastinal, or hilar lymph nodes are identified. Pulmonary arterial enlargement is similar to the prior study, with the main pulmonary artery measuring approximately 3.7 cm in diameter and suggestive of underlying pulmonary arterial hypertension. The heart is mildly enlarged. There is a  small right pleural effusion.  Evaluation of the lung parenchyma is mildly limited by motion artifact. Opacities in the dependent portions of the lower greater than upper lobes bilaterally are favored to represent atelectasis. Major airways are patent. No lung mass or nodules are identified. No lytic or blastic osseous lesions are identified.  CT ABDOMEN AND PELVIS FINDINGS  Evaluation of the upper abdomen is mildly limited by motion artifact. The liver, gallbladder, spleen, adrenal glands, and pancreas are grossly unremarkable. No renal mass or hydronephrosis is identified. A few punctate foci of hyperattenuation in the renal sinuses bilaterally may represent tiny nonobstructing calculi.  Oral contrast is present in multiple loops of nondilated small bowel without evidence of obstruction. There is a moderate amount of stool in the ascending and descending colon. There is mild gaseous distension of the transverse colon to approximately 5.5 cm in diameter, with mild gaseous distention of a portion of the sigmoid colon as well. There is a moderate amount of liquid stool in the distal sigmoid colon and rectum. No gross bowel wall thickening is identified.  Bladder is moderately distended. Uterus is identified. No pelvic mass is seen. Mild atherosclerotic vascular calcification is noted. No free fluid or enlarged lymph nodes are identified. No suspicious lytic or blastic  osseous lesions are identified. Facet arthrosis is noted in the lower lumbar spine, severe on the right at L5-S1 with facet joint ankylosis.  IMPRESSION: 1. No evidence of malignancy in the chest, abdomen, or pelvis. 2. Trace right pleural effusion. 3. Dependent opacities in both lungs, favored to represent atelectasis. 4. No evidence of bowel obstruction. Moderate amount of colonic stool, including liquid stool in the distal colon and rectum. Mild gaseous distention of the transverse colon.   Electronically Signed   By: Logan Bores   On: 11/08/2014 17:07   US Renal  11/06/2014   CLINICAL DATA:  Acute kidney injury.  EXAM: RENAL / URINARY TRACT ULTRASOUND COMPLETE  COMPARISON:  None.  FINDINGS: Right Kidney:  Length: 8.0 cm. Increased parenchymal echogenicity. No mass or hydronephrosis visualized. Technically limited evaluation.  Left Kidney:  Length: 7.7 cm. Increased parenchymal echogenicity. No mass or hydronephrosis visualized. Technically limited evaluation.  Bladder:  Appears normal for degree of bladder distention.  IMPRESSION: Increased renal echogenicity consistent with chronic medical renal disease. No obstructive uropathy. Technically limited evaluation of the renal parenchyma for fine detail.   Electronically Signed   By: Jeb Levering M.D.   On: 11/06/2014 06:44   Dg Chest Port 1 View  11/06/2014   CLINICAL DATA:  Altered mental status.  EXAM: PORTABLE CHEST - 1 VIEW  COMPARISON:  07/05/2013  FINDINGS: The heart is at the upper limits of normal in size. Pulmonary vasculature is normal. No consolidation, pleural effusion, or pneumothorax. No acute osseous abnormalities are seen. Unchanged scoliotic curvature of the spine.  IMPRESSION: No acute pulmonary process.   Electronically Signed   By: Jeb Levering M.D.   On: 11/06/2014 00:04      IMPRESSION: This is a very nice 54 yo woman with a history of right sided anaplastic astrocytoma who now presents 4 years after treatment with a new left  sided 11 mm parietal lesion with rim enhancement.  The lesion is relatively small and may be amenable to stereotactic radiosurgery, whether it represents a recurrence of high grade glioma or whether it reflects metastatic disease.  In this setting the treatment would be similar. The delivery and toxicity profile of this single radiation dose would likely  be well tolerated, despite the patient's condition.  However the benefit of this intervention requires scrutiny and thoughtful multidisciplinary collaboration.  PLAN: This is a case requiring complex decision making with regard to management of a new brain lesion in light of generally declining performance status.  The value of ongoing diagnostic and therapeutic interventions needs to be weighed carefully for their benefit to the patient versus possible detriment.  Our next brain oncology conference is scheduled tomorrow morning.  Before making further plans, I will review her films with the rest of the team including neurosurgery Kathyrn Sheriff), medical oncology and palliative care Davis Gourd), most of whom are familiar with her case.  We will work toward developing a consensus surrounding the variety of options for this patient.   Some options might include: 1.  Comfort Care 2.  Salvage stereotactic radiosurgery 3.  Stabilize and discharge with follow-up 3T MRI using SRS protocol in 2-4 weeks to assess performance status and consult with family and care givers prior to proceeding with treatment.  I spent more than 50% of my visit in counseling and/or coordination of care.   ------------------------------------------------  Sheral Apley. [name], M.D.

## 2014-11-13 ENCOUNTER — Inpatient Hospital Stay (HOSPITAL_COMMUNITY): Payer: Medicare Other

## 2014-11-13 DIAGNOSIS — G9340 Encephalopathy, unspecified: Secondary | ICD-10-CM

## 2014-11-13 LAB — GLUCOSE, CAPILLARY
GLUCOSE-CAPILLARY: 139 mg/dL — AB (ref 65–99)
Glucose-Capillary: 119 mg/dL — ABNORMAL HIGH (ref 65–99)
Glucose-Capillary: 130 mg/dL — ABNORMAL HIGH (ref 65–99)

## 2014-11-13 LAB — CBC
HEMATOCRIT: 33 % — AB (ref 36.0–46.0)
Hemoglobin: 10.7 g/dL — ABNORMAL LOW (ref 12.0–15.0)
MCH: 26 pg (ref 26.0–34.0)
MCHC: 32.4 g/dL (ref 30.0–36.0)
MCV: 80.1 fL (ref 78.0–100.0)
PLATELETS: 117 10*3/uL — AB (ref 150–400)
RBC: 4.12 MIL/uL (ref 3.87–5.11)
RDW: 15.3 % (ref 11.5–15.5)
WBC: 5.7 10*3/uL (ref 4.0–10.5)

## 2014-11-13 LAB — BASIC METABOLIC PANEL
Anion gap: 6 (ref 5–15)
BUN: 22 mg/dL — ABNORMAL HIGH (ref 6–20)
CO2: 19 mmol/L — ABNORMAL LOW (ref 22–32)
Calcium: 9.6 mg/dL (ref 8.9–10.3)
Chloride: 112 mmol/L — ABNORMAL HIGH (ref 101–111)
Creatinine, Ser: 1.19 mg/dL — ABNORMAL HIGH (ref 0.44–1.00)
GFR, EST AFRICAN AMERICAN: 58 mL/min — AB (ref >60)
GFR, EST NON AFRICAN AMERICAN: 50 mL/min — AB (ref >60)
GLUCOSE: 148 mg/dL — AB (ref 65–99)
POTASSIUM: 3.8 mmol/L (ref 3.5–5.1)
SODIUM: 137 mmol/L (ref 135–145)

## 2014-11-13 MED ORDER — BISACODYL 10 MG RE SUPP
10.0000 mg | Freq: Two times a day (BID) | RECTAL | Status: DC
  Administered 2014-11-13 – 2014-11-14 (×3): 10 mg via RECTAL
  Filled 2014-11-13 (×3): qty 1

## 2014-11-13 MED ORDER — SENNOSIDES-DOCUSATE SODIUM 8.6-50 MG PO TABS
2.0000 | ORAL_TABLET | Freq: Every day | ORAL | Status: DC
  Administered 2014-11-13 – 2014-11-15 (×3): 2 via ORAL
  Filled 2014-11-13 (×3): qty 2

## 2014-11-13 MED ORDER — ENSURE ENLIVE PO LIQD: 237.0000 mL | Freq: Two times a day (BID) | ORAL | Status: DC | PRN

## 2014-11-13 NOTE — Progress Notes (Signed)
Nutrition Follow-up  DOCUMENTATION CODES:  Obesity unspecified  INTERVENTION:  Ensure Enlive (each supplement provides 350kcal and 20 grams of protein)PRN  NUTRITION DIAGNOSIS:  Inadequate oral intake related to inability to eat as evidenced by NPO status.  Discontiued  GOAL:  Patient will meet greater than or equal to 90% of their needs  Being met  MONITOR:  Diet advancement, PO intake, Supplement acceptance, Labs, Weight trends, I & O's  REASON FOR ASSESSMENT:  Low Braden    ASSESSMENT: 54 y.o. female with hx of GERD, depression, anxiety, L parietal astrocytoma of brain (s/p excision and radiation therapy 2011), CVA, CKD III, and DVT who presented with worsening mental status and jerking. She was brought in via EMS from her nursing home due to worsening mental status.  Pt was advanced to a dysphagia 1 diet with thin liquids on 6/11. Per nursing notes pt is eating 50-100% of meals. Pt states her appetite is "pretty good" and she is eating normally. Per husband at bedside, pt occasionally used Ensure supplements in the past. RD will order Ensure PRN.   Labs: low hemoglobin  Height:  Ht Readings from Last 1 Encounters:  11/06/14 5' 9"  (1.753 m)    Weight:  Wt Readings from Last 1 Encounters:  11/08/14 158 lb 3.2 oz (71.759 kg)    Ideal Body Weight:  65.9 kg  Wt Readings from Last 10 Encounters:  11/08/14 158 lb 3.2 oz (71.759 kg)  10/12/14 154 lb (69.854 kg)  10/10/14 154 lb (69.854 kg)  09/12/14 156 lb (70.761 kg)  07/20/14 156 lb (70.761 kg)  06/09/14 153 lb (69.4 kg)  05/02/14 152 lb (68.947 kg)  01/18/14 152 lb (68.947 kg)  11/16/13 149 lb (67.586 kg)  07/13/13 142 lb (64.411 kg)    BMI:  Body mass index is 23.35 kg/(m^2).  Estimated Nutritional Needs:  Kcal:  1800-2000  Protein:  90-100 gm  Fluid:  1.8-2.0 L  Skin:  Reviewed, no issues  Diet Order:  DIET - DYS 1 Room service appropriate?: Yes; Fluid consistency:: Thin  EDUCATION  NEEDS:  No education needs identified at this time   Intake/Output Summary (Last 24 hours) at 11/13/14 1324 Last data filed at 11/13/14 0759  Gross per 24 hour  Intake    240 ml  Output      0 ml  Net    240 ml    Last BM:  6/12  Pryor Ochoa RD, LDN Inpatient Clinical Dietitian Pager: [phone] After Hours Pager: [phone]

## 2014-11-13 NOTE — Progress Notes (Signed)
Progress Note  [name] [facility]:[id number] DOB: [birth year] DOA: 11/05/2014 PCP: [name], DO  Admit HPI / Brief Narrative: 54 y.o. 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  Presents with worsening mental status and jerking. She was brought in via EMS from her nursing home due to worsening mental status. At baseline, she could say ' yes or no" to questioning. But since Saturday, she could not interact with staff as she normally did. She seems to be more confused.   In ED, patient was found to have WBC 4.3, temperature 99.7, no tachycardia, INR 1.2, PTT 29, positive urinalysis, troponin negative, AoCKD-III.  Assessment/Plan: Acute encephalopathy -Likely toxic metabolic encephalopathy related to Escherichia coli pyelonephritis +/- contribution from brain mass Improved. ?at baseline?  New 11 x 9 x 11 mm rim enhancing lesion within the medial left occipital lobe/ Hx of astrocytoma -Dexamethasone taper per neuro Appreciate consultants.  Case will be discussed at Brain oncology conference today  Abdominal distention: No vomiting. Reportedly having bowel movements, but will check a KUB to evaluate for ileus or obstruction.  Dysphagia Tolerating dysphagia diet.  Left spastic hemiparesis Previous astrocytoma/XRT/resection  UTI S/p 6 days rocephin  Seizure -EEG ok -Continue Vimpat 50 mg BID; -Continue Lamictal 100 mg daily -Continue Keppra 500 mg BID  HLD  History of stroke  Acute on CKD stage 3 improved  GERD  Depression -Continue Abilify 7 mg daily  Pancytopenia Likely infection related. stable   Code Status: FULL Family Communication:  Disposition Plan: SNF- from [place]: [place] [facility] Rad onc consult pending  Procedure/Significant Events: 6/6  EEG;-Abnormal EEG due to generalized slowing indicating a mild to moderate cerebral disturbance (encephalopathy). Intermittent focal slowing is noted in the left posterior region indicating a potential seizure foci. 6/6 MRI brain with and without contrast;-New 11 x 9 x 11 mm rim enhancing lesion within the medial left occipital lobe with associated localized vasogenic edema.  -7 mm focus of nodular enhancement adjacent to the dilated body of the right lateral ventricle which is similar to previous exam. -Remote right MCA territory infarcts  6/9 EEG;abnormal EEG secondary to general background slowing  Antibiotics: Rocephin 6/6>>6/12  Continuous Infusions:   HPI/Subjective: Nursing staff reports that patient has abdominal distention. When asked, patient does report abdominal pain "like I have to go #2". No vomiting.  Objective: VITAL SIGNS: Temp: 98.4 F (36.9 C) (06/13 0513) Temp Source: Oral (06/13 0513) BP: 114/65 mmHg (06/13 0513) Pulse Rate: 54 (06/13 0513) SPO2; FIO2:   Intake/Output Summary (Last 24 hours) at 11/13/14 0845 Last data filed at 11/13/14 0759  Gross per 24 hour  Intake    480 ml  Output      0 ml  Net    480 ml     Exam: General: alert. Answers questions appropriately Lungs: Clear to auscultation bilaterally without wheezes or crackles Cardiovascular: Regular rate and rhythm without murmur gallop or rub normal S1 and S2 Abdomen: Completely soft and nontender. Possibly slightly distended. Bowel sounds present. Extremities: No significant cyanosis, clubbing, or edema bilateral lower extremities  Data Reviewed: Basic Metabolic Panel:  Recent Labs Lab 11/07/14 0217 11/08/14 0240 11/08/14 1140 11/09/14 0247 11/11/14 0350 11/13/14 0313  NA 143  --  143 140 143 137  K 3.9  --  3.9 3.7 3.9 3.8  CL 113*  --  117*  115* 116* 112*  CO2 21*  --  19* 15* 18* 19*  GLUCOSE 122*  --  117* 109* 108* 148*  BUN 22*  --  28* 27* 26* 22*  CREATININE 1.24*  --   1.18* 1.18* 1.18* 1.19*  CALCIUM 9.9  --  10.4* 9.7 9.9 9.6  MG  --  1.8  --   --   --   --    Liver Function Tests:  Recent Labs Lab 11/07/14 0217  AST 59*  ALT 43  ALKPHOS 75  BILITOT 0.6  PROT 6.1*  ALBUMIN 3.2*   No results for input(s): LIPASE, AMYLASE in the last 168 hours.  Recent Labs Lab 11/11/14 0350  AMMONIA 30   CBC:  Recent Labs Lab 11/07/14 0217 11/08/14 0240 11/09/14 0247 11/11/14 0350 11/13/14 0313  WBC 2.7* 4.2 5.0 3.6* 5.7  NEUTROABS  --  3.3  --   --   --   HGB 10.1* 9.2* 8.8* 9.7* 10.7*  HCT 32.2* 29.3* 27.5* 29.9* 33.0*  MCV 82.1 80.9 82.1 79.7 80.1  PLT 111* 124* 115* 98* 117*   Cardiac Enzymes: No results for input(s): CKTOTAL, CKMB, CKMBINDEX, TROPONINI in the last 168 hours. BNP (last 3 results) No results for input(s): BNP in the last 8760 hours.  ProBNP (last 3 results) No results for input(s): PROBNP in the last 8760 hours.  CBG:  Recent Labs Lab 11/12/14 11/12/14 0921 11/12/14 1647 11/13/14 0026 11/13/14 0736  GLUCAP 149* 161* 139* 130* 119*    Recent Results (from the past 240 hour(s))  Culture, blood (x 2)     Status: None   Collection Time: 11/06/14  4:35 AM  Result Value Ref Range Status   Specimen Description BLOOD LEFT ARM  Final   Special Requests BAA 5ML  Final   Culture   Final    NO GROWTH 5 DAYS Performed at [facility]    Report Status 11/12/2014 FINAL  Final  Culture, blood (x 2)     Status: None   Collection Time: 11/06/14  7:30 AM  Result Value Ref Range Status   Specimen Description BLOOD LEFT ASSIST CONTROL  Final   Special Requests BAA 10ML  Final   Culture   Final    NO GROWTH 5 DAYS Note: Culture results may be compromised due to an excessive volume of blood received in culture bottles. Performed at [facility]    Report Status 11/12/2014 FINAL  Final  Urine culture     Status: None   Collection Time: 11/06/14  7:50 AM  Result Value Ref Range Status   Specimen  Description URINE, CLEAN CATCH  Final   Special Requests NONE  Final   Colony Count   Final    >=100,000 COLONIES/ML Performed at [facility]    Culture   Final    ESCHERICHIA COLI Performed at [facility]    Report Status 11/08/2014 FINAL  Final   Organism ID, Bacteria ESCHERICHIA COLI  Final      Susceptibility   Escherichia coli - MIC*    AMPICILLIN >=32 RESISTANT Resistant     CEFAZOLIN <=4 SENSITIVE Sensitive     CEFTRIAXONE <=1 SENSITIVE Sensitive     CIPROFLOXACIN <=0.25 SENSITIVE Sensitive     GENTAMICIN <=1 SENSITIVE Sensitive     LEVOFLOXACIN <=0.12 SENSITIVE Sensitive     NITROFURANTOIN <=16 SENSITIVE Sensitive     TOBRAMYCIN <=1 SENSITIVE Sensitive     TRIMETH/SULFA <=20 SENSITIVE Sensitive  PIP/TAZO <=4 SENSITIVE Sensitive     * ESCHERICHIA COLI  MRSA PCR Screening     Status: None   Collection Time: 11/06/14  3:45 PM  Result Value Ref Range Status   MRSA by PCR NEGATIVE NEGATIVE Final    Comment:        The GeneXpert MRSA Assay (FDA approved for NASAL specimens only), is one component of a comprehensive MRSA colonization surveillance program. It is not intended to diagnose MRSA infection nor to guide or monitor treatment for MRSA infections.        Scheduled Meds:  Scheduled Meds: . antiseptic oral rinse  7 mL Mouth Rinse q12n4p  . ARIPiprazole  7 mg Oral Daily  . aspirin  325 mg Oral Daily  . atorvastatin  10 mg Oral Daily  . chlorhexidine  15 mL Mouth Rinse BID  . dexamethasone  4 mg Oral BID  . FLUoxetine  60 mg Oral Daily  . gabapentin  300 mg Oral TID  . hydrocortisone  1 application Rectal BID  . lacosamide  50 mg Oral BID  . lamoTRIgine  100 mg Oral Daily  . levETIRAcetam  500 mg Oral BID  . pantoprazole  40 mg Oral Daily    Time spent on care of this patient: 25 mins  Delfina Redwood , MD  [facility] Pager [phone]  Text Page:      [email or web address]      password [facility]  11/13/2014, 8:45 AM    LOS: 7 days

## 2014-11-13 NOTE — Progress Notes (Signed)
Speech Language Pathology Treatment: Dysphagia  Patient Details Name: [name] MRN: [id number] DOB: 01/11/1961 Today's Date: 11/13/2014 Time: 1856-3149 SLP Time Calculation (min) (ACUTE ONLY): 20 min  Assessment / Plan / Recommendation Clinical Impression  Pt with improved mentation compared to upon admit per neuro md in room.  Note results of DG abd indicating possible colonic ileus and phoned md to determine if md desires diet modifier.  MD advised to continue current diet.   Assisted pt to consume solid, puree and thin gingerale.  Delayed mastication and oral transiting noted but no oral pocketing.  Immediate cough x1 with liquid via straw noted with sequential bolus consumption.  Small single boluses tolerated well.  Pt reports her swallow ability to be baseline and admits to premorbid cough with large boluses of thin and oral pocketing.    Advised pt to attempt to help (hand over hand) to self feed to maximize neuro input for airway protection/swallow efficiency.  Use of demonstration and teach back completed.   When MD deems appropriate, rec advance to DYS3/thin.  SLP to sign off as all education completed and suspect baseline swallow ability.     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 Admitted with altered mental status. MRI shows new 11 x 9 x 11 mm rim enhancing lesion within the medial left occipital lobe.  Pt has been NPO since admission.  Swallow eval ordered 6/6 then canceled but was reordered.  Pt started on dys1/thin diet.     Pertinent Vitals Pain Assessment: Faces Pain Score: 3  Pain Location: leg-left per neuro, pt for echo  SLP Plan  Continue with current plan of care    Recommendations Diet recommendations: Dysphagia 3 (mechanical soft);Thin liquid (rec advance to dys3/thin when appropriate) Liquids provided via: Straw;Cup Medication Administration: Whole meds with puree Supervision:  Full supervision/cueing for compensatory strategies Compensations: Slow rate;Small sips/bites;Check for pocketing Postural Changes and/or Swallow Maneuvers: Seated upright 90 degrees              Oral Care Recommendations: Oral care BID Follow up Recommendations: Other (comment) (tbd) Plan: Continue with current plan of care    [place], [place] [facility] SLP [phone]

## 2014-11-13 NOTE — Clinical Social Work Note (Signed)
Patient is a long-term resident of [place]. Clinical Social Worker will continue to follow patient and pt's family for continued support and to facilitate discharge needs once medically stable.   FL-2 on chart for MD signature. CSW remains available.   Glendon Axe, MSW, LCSWA [phone] 11/13/2014 12:39 PM

## 2014-11-13 NOTE — Progress Notes (Signed)
Subjective: The patient is sleepy but is without complaints at this time. She is oriented to place but not to date. She follows some commands. The patient was seen by Dr. [name] for radiation oncology yesterday but she does not seem to remember meeting him or any of the conversation. No further seizure activities been reported.   Objective: Current vital signs: BP 114/65 mmHg  Pulse 54  Temp(Src) 98.4 F (36.9 C) (Oral)  Resp 16  Ht 5\' 9"  (1.753 m)  Wt 71.759 kg (158 lb 3.2 oz)  BMI 23.35 kg/m2  SpO2 98%  LMP 08/26/2010 Vital signs in last 24 hours: Temp:  [97.7 F (36.5 C)-99.5 F (37.5 C)] 98.4 F (36.9 C) (06/13 0513) Pulse Rate:  [54-78] 54 (06/13 0513) Resp:  [14-18] 16 (06/13 0513) BP: (112-142)/(63-87) 114/65 mmHg (06/13 0513) SpO2:  [95 %-98 %] 98 % (06/13 0513)  Intake/Output from previous day: 06/12 0701 - 06/13 0700 In: 480 [P.O.:480] Out: -  Intake/Output this shift: Total I/O In: 240 [P.O.:240] Out: -  Nutritional status: DIET - DYS 1 Room service appropriate?: Yes; Fluid consistency:: Thin  Physical Exam  Neurologic Exam:  MENTAL STATUS: slow to respond. She answers some questions and follows some commands. She knows she is in [facility] in [place] but has no idea of the month or year. She keeps her eyes closed throughout the interview and exam. CRANIAL NERVES: Decreased sensation on the left side of her face with a left lower facial droop. MOTOR: The patient was unable to move her left upper extremity. She did raise her right upper extremity against gravity. She will hold her toes on both feet. SENSORY: Sensation was decreased to light touch on the left upper and lower extremities COORDINATION: The patient will not cooperate for finger-to-nose testing. GAIT/STATION: Deferred   Lab Results: Basic Metabolic Panel:  Recent Labs Lab 11/07/14 0217 11/08/14 0240 11/08/14 1140 11/09/14 0247 11/11/14 0350 11/13/14 0313  NA 143  --  143  140 143 137  K 3.9  --  3.9 3.7 3.9 3.8  CL 113*  --  117* 115* 116* 112*  CO2 21*  --  19* 15* 18* 19*  GLUCOSE 122*  --  117* 109* 108* 148*  BUN 22*  --  28* 27* 26* 22*  CREATININE 1.24*  --  1.18* 1.18* 1.18* 1.19*  CALCIUM 9.9  --  10.4* 9.7 9.9 9.6  MG  --  1.8  --   --   --   --     Liver Function Tests:  Recent Labs Lab 11/07/14 0217  AST 59*  ALT 43  ALKPHOS 75  BILITOT 0.6  PROT 6.1*  ALBUMIN 3.2*   No results for input(s): LIPASE, AMYLASE in the last 168 hours.  Recent Labs Lab 11/11/14 0350  AMMONIA 30    CBC:  Recent Labs Lab 11/07/14 0217 11/08/14 0240 11/09/14 0247 11/11/14 0350 11/13/14 0313  WBC 2.7* 4.2 5.0 3.6* 5.7  NEUTROABS  --  3.3  --   --   --   HGB 10.1* 9.2* 8.8* 9.7* 10.7*  HCT 32.2* 29.3* 27.5* 29.9* 33.0*  MCV 82.1 80.9 82.1 79.7 80.1  PLT 111* 124* 115* 98* 117*    Cardiac Enzymes: No results for input(s): CKTOTAL, CKMB, CKMBINDEX, TROPONINI in the last 168 hours.  Lipid Panel:  Recent Labs Lab 11/08/14 0240  CHOL 125  TRIG 40  HDL 54  CHOLHDL 2.3  VLDL 8  LDLCALC 63  CBG:  Recent Labs Lab 11/12/14 11/12/14 0921 11/12/14 1647 11/13/14 0026 11/13/14 0736  GLUCAP 149* 161* 139* 130* 119*    Microbiology: Results for orders placed or performed during the hospital encounter of 11/05/14  Culture, blood (x 2)     Status: None   Collection Time: 11/06/14  4:35 AM  Result Value Ref Range Status   Specimen Description BLOOD LEFT ARM  Final   Special Requests BAA 5ML  Final   Culture   Final    NO GROWTH 5 DAYS Performed at [facility]    Report Status 11/12/2014 FINAL  Final  Culture, blood (x 2)     Status: None   Collection Time: 11/06/14  7:30 AM  Result Value Ref Range Status   Specimen Description BLOOD LEFT ASSIST CONTROL  Final   Special Requests BAA 10ML  Final   Culture   Final    NO GROWTH 5 DAYS Note: Culture results may be compromised due to an excessive volume of blood received  in culture bottles. Performed at [facility]    Report Status 11/12/2014 FINAL  Final  Urine culture     Status: None   Collection Time: 11/06/14  7:50 AM  Result Value Ref Range Status   Specimen Description URINE, CLEAN CATCH  Final   Special Requests NONE  Final   Colony Count   Final    >=100,000 COLONIES/ML Performed at [facility]    Culture   Final    ESCHERICHIA COLI Performed at [facility]    Report Status 11/08/2014 FINAL  Final   Organism ID, Bacteria ESCHERICHIA COLI  Final      Susceptibility   Escherichia coli - MIC*    AMPICILLIN >=32 RESISTANT Resistant     CEFAZOLIN <=4 SENSITIVE Sensitive     CEFTRIAXONE <=1 SENSITIVE Sensitive     CIPROFLOXACIN <=0.25 SENSITIVE Sensitive     GENTAMICIN <=1 SENSITIVE Sensitive     LEVOFLOXACIN <=0.12 SENSITIVE Sensitive     NITROFURANTOIN <=16 SENSITIVE Sensitive     TOBRAMYCIN <=1 SENSITIVE Sensitive     TRIMETH/SULFA <=20 SENSITIVE Sensitive     PIP/TAZO <=4 SENSITIVE Sensitive     * ESCHERICHIA COLI  MRSA PCR Screening     Status: None   Collection Time: 11/06/14  3:45 PM  Result Value Ref Range Status   MRSA by PCR NEGATIVE NEGATIVE Final    Comment:        The GeneXpert MRSA Assay (FDA approved for NASAL specimens only), is one component of a comprehensive MRSA colonization surveillance program. It is not intended to diagnose MRSA infection nor to guide or monitor treatment for MRSA infections.     Coagulation Studies: No results for input(s): LABPROT, INR in the last 72 hours.  Imaging: No results found.  Medications:  Scheduled: . antiseptic oral rinse  7 mL Mouth Rinse q12n4p  . ARIPiprazole  7 mg Oral Daily  . aspirin  325 mg Oral Daily  . atorvastatin  10 mg Oral Daily  . chlorhexidine  15 mL Mouth Rinse BID  . dexamethasone  4 mg Oral BID  . FLUoxetine  60 mg Oral Daily  . gabapentin  300 mg Oral TID  . hydrocortisone  1 application Rectal BID  . lacosamide  50  mg Oral BID  . lamoTRIgine  100 mg Oral Daily  . levETIRAcetam  500 mg Oral BID  . pantoprazole  40 mg Oral Daily  Assessment/Plan: Treatment decisions are pending per radiation oncology. Comfort care versus stereotactic radiosurgery versus stabilization and discharge home with outpatient treatment and follow-up. As per Dr. Doy Mince note continue Lamictal and vimpat at current doses. Keppra has been changed to 500 mg twice a day. Continue steroid taper.  Neurology will see patient on a as needed basis. Please call if we can be of further service.  Mikey Bussing PA-C [facility] Pager [phone] 11/13/2014, 8:38 AM   I have seen and evaluated the patient. I have reviewed the above note and made appropriate changes.   54 yo F with new brain mass and seizures. I susepct she has spme speech involvement from her new mass. No furtehr seizures.  Continue vimpat and lamictal. Please call with any further questions or concerns.   Roland Rack, MD [facility] [phone]  If 7pm- 7am, please page neurology on call as listed in [place].

## 2014-11-14 ENCOUNTER — Inpatient Hospital Stay (HOSPITAL_COMMUNITY): Payer: Medicare Other

## 2014-11-14 ENCOUNTER — Encounter (HOSPITAL_COMMUNITY): Payer: Self-pay | Admitting: *Deleted

## 2014-11-14 DIAGNOSIS — Z515 Encounter for palliative care: Secondary | ICD-10-CM

## 2014-11-14 DIAGNOSIS — K567 Ileus, unspecified: Secondary | ICD-10-CM

## 2014-11-14 DIAGNOSIS — Z7189 Other specified counseling: Secondary | ICD-10-CM

## 2014-11-14 LAB — GLUCOSE, CAPILLARY
GLUCOSE-CAPILLARY: 132 mg/dL — AB (ref 65–99)
Glucose-Capillary: 132 mg/dL — ABNORMAL HIGH (ref 65–99)
Glucose-Capillary: 127 mg/dL — ABNORMAL HIGH (ref 65–99)

## 2014-11-14 MED ORDER — BISACODYL 10 MG RE SUPP
10.0000 mg | Freq: Two times a day (BID) | RECTAL | Status: DC
  Administered 2014-11-14 – 2014-11-16 (×3): 10 mg via RECTAL
  Filled 2014-11-14 (×3): qty 1

## 2014-11-14 NOTE — Progress Notes (Signed)
Chaplain initiated visit with pt. Chaplain called nurse tech upon pt request. Chaplain offered to keep pt in her prayers. Gesture appreciated. Chaplain informed pt of her continued support as needed. Page chaplain as needed.    11/14/14 0900  Clinical Encounter Type  Visited With Patient  Visit Type Initial;Spiritual support  Spiritual Encounters  Spiritual Needs Emotional;Prayer  Stress Factors  Patient Stress Factors None identified  Sarya Linenberger, Barbette Hair, Chaplain 11/14/2014 9:58 AM

## 2014-11-14 NOTE — Progress Notes (Signed)
Progress Note  [name] GPQ:[id number] DOB: Oct 02, 1960 DOA: 11/05/2014 PCP: [name], DO  Admit HPI / Brief Narrative: 54 y.o. 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  Presents with worsening mental status and jerking. She was brought in via EMS from her nursing home due to worsening mental status. At baseline, she could say ' yes or no" to questioning. But since Saturday, she could not interact with staff as she normally did. She seems to be more confused.   In ED, patient was found to have WBC 4.3, temperature 99.7, no tachycardia, INR 1.2, PTT 29, positive urinalysis, troponin negative, AoCKD-III.  Assessment/Plan: Acute encephalopathy -Likely toxic metabolic encephalopathy related to Escherichia coli pyelonephritis +/- contribution from brain mass Improved. ?at baseline?  New 11 x 9 x 11 mm rim enhancing lesion within the medial left occipital lobe/ Hx of astrocytoma -Dexamethasone taper per neuro D/w Dr. [name].  Dr. Tresa Moore is off. [facility] for discharge and f/u next week as outpt.   Colonic ileus: slightly improved today. Reviewed kub images. Having bms. Will give soap suds enema.   Dysphagia?? ST rec D3 thins  Left spastic hemiparesis Previous astrocytoma/XRT/resection  UTI S/p rx  Seizure -EEG ok -Continue Vimpat 50 mg BID; -Continue Lamictal 100 mg daily -Continue Keppra 500 mg BID  HLD  History of stroke  Acute on CKD stage 3 improved  GERD  Depression -Continue Abilify 7 mg daily  Pancytopenia Likely infection related. stable   Code Status: FULL Family Communication:  Disposition Plan: SNF tomorrow if stable   Consultants: Dr.Leslie Reynolds Neurosurgery Rad onc PMT  Procedure/Significant Events: 6/6 EEG;-Abnormal EEG due to generalized slowing indicating a mild to moderate  cerebral disturbance (encephalopathy). Intermittent focal slowing is noted in the left posterior region indicating a potential seizure foci. 6/6 MRI brain with and without contrast;-New 11 x 9 x 11 mm rim enhancing lesion within the medial left occipital lobe with associated localized vasogenic edema.  -7 mm focus of nodular enhancement adjacent to the dilated body of the right lateral ventricle which is similar to previous exam. -Remote right MCA territory infarcts  6/9 EEG;abnormal EEG secondary to general background slowing  Antibiotics: Rocephin 6/6>>6/12  Continuous Infusions:   HPI/Subjective: No abd pain. Having stools, no vomiting  Objective: VITAL SIGNS: Temp: 97.9 F (36.6 C) (06/14 1031) Temp Source: Oral (06/14 1031) BP: 101/62 mmHg (06/14 1031) Pulse Rate: 59 (06/14 1031) SPO2; FIO2:   Intake/Output Summary (Last 24 hours) at 11/14/14 1247 Last data filed at 11/14/14 0800  Gross per 24 hour  Intake    120 ml  Output      0 ml  Net    120 ml     Exam: General: alert. Answers questions appropriately Lungs: Clear to auscultation bilaterally without wheezes or crackles Cardiovascular: Regular rate and rhythm without murmur gallop or rub normal S1 and S2 Abdomen: s, nt, nd Extremities: No significant cyanosis, clubbing, or edema bilateral lower extremities  Data Reviewed: Basic Metabolic Panel:  Recent Labs Lab 11/08/14 0240 11/08/14 1140 11/09/14 0247 11/11/14 0350 11/13/14 0313  NA  --  143 140 143 137  K  --  3.9 3.7 3.9 3.8  CL  --  117* 115* 116* 112*  CO2  --  19* 15* 18* 19*  GLUCOSE  --  117* 109* 108* 148*  BUN  --  28*  27* 26* 22*  CREATININE  --  1.18* 1.18* 1.18* 1.19*  CALCIUM  --  10.4* 9.7 9.9 9.6  MG 1.8  --   --   --   --    Liver Function Tests: No results for input(s): AST, ALT, ALKPHOS, BILITOT, PROT, ALBUMIN in the last 168 hours. No results for input(s): LIPASE, AMYLASE in the last 168 hours.  Recent Labs Lab  11/11/14 0350  AMMONIA 30   CBC:  Recent Labs Lab 11/08/14 0240 11/09/14 0247 11/11/14 0350 11/13/14 0313  WBC 4.2 5.0 3.6* 5.7  NEUTROABS 3.3  --   --   --   HGB 9.2* 8.8* 9.7* 10.7*  HCT 29.3* 27.5* 29.9* 33.0*  MCV 80.9 82.1 79.7 80.1  PLT 124* 115* 98* 117*   Cardiac Enzymes: No results for input(s): CKTOTAL, CKMB, CKMBINDEX, TROPONINI in the last 168 hours. BNP (last 3 results) No results for input(s): BNP in the last 8760 hours.  ProBNP (last 3 results) No results for input(s): PROBNP in the last 8760 hours.  CBG:  Recent Labs Lab 11/13/14 0026 11/13/14 0736 11/13/14 1604 11/14/14 0009 11/14/14 0846  GLUCAP 130* 119* 139* 132* 132*    Recent Results (from the past 240 hour(s))  Culture, blood (x 2)     Status: None   Collection Time: 11/06/14  4:35 AM  Result Value Ref Range Status   Specimen Description BLOOD LEFT ARM  Final   Special Requests BAA 5ML  Final   Culture   Final    NO GROWTH 5 DAYS Performed at [facility]    Report Status 11/12/2014 FINAL  Final  Culture, blood (x 2)     Status: None   Collection Time: 11/06/14  7:30 AM  Result Value Ref Range Status   Specimen Description BLOOD LEFT ASSIST CONTROL  Final   Special Requests BAA 10ML  Final   Culture   Final    NO GROWTH 5 DAYS Note: Culture results may be compromised due to an excessive volume of blood received in culture bottles. Performed at [facility]    Report Status 11/12/2014 FINAL  Final  Urine culture     Status: None   Collection Time: 11/06/14  7:50 AM  Result Value Ref Range Status   Specimen Description URINE, CLEAN CATCH  Final   Special Requests NONE  Final   Colony Count   Final    >=100,000 COLONIES/ML Performed at [facility]    Culture   Final    ESCHERICHIA COLI Performed at [facility]    Report Status 11/08/2014 FINAL  Final   Organism ID, Bacteria ESCHERICHIA COLI  Final      Susceptibility   Escherichia coli -  MIC*    AMPICILLIN >=32 RESISTANT Resistant     CEFAZOLIN <=4 SENSITIVE Sensitive     CEFTRIAXONE <=1 SENSITIVE Sensitive     CIPROFLOXACIN <=0.25 SENSITIVE Sensitive     GENTAMICIN <=1 SENSITIVE Sensitive     LEVOFLOXACIN <=0.12 SENSITIVE Sensitive     NITROFURANTOIN <=16 SENSITIVE Sensitive     TOBRAMYCIN <=1 SENSITIVE Sensitive     TRIMETH/SULFA <=20 SENSITIVE Sensitive     PIP/TAZO <=4 SENSITIVE Sensitive     * ESCHERICHIA COLI  MRSA PCR Screening     Status: None   Collection Time: 11/06/14  3:45 PM  Result Value Ref Range Status   MRSA by PCR NEGATIVE NEGATIVE Final    Comment:  The GeneXpert MRSA Assay (FDA approved for NASAL specimens only), is one component of a comprehensive MRSA colonization surveillance program. It is not intended to diagnose MRSA infection nor to guide or monitor treatment for MRSA infections.        Scheduled Meds:  Scheduled Meds: . antiseptic oral rinse  7 mL Mouth Rinse q12n4p  . ARIPiprazole  7 mg Oral Daily  . aspirin  325 mg Oral Daily  . atorvastatin  10 mg Oral Daily  . bisacodyl  10 mg Rectal BID  . chlorhexidine  15 mL Mouth Rinse BID  . dexamethasone  4 mg Oral BID  . FLUoxetine  60 mg Oral Daily  . gabapentin  300 mg Oral TID  . lacosamide  50 mg Oral BID  . lamoTRIgine  100 mg Oral Daily  . levETIRAcetam  500 mg Oral BID  . pantoprazole  40 mg Oral Daily  . senna-docusate  2 tablet Oral Daily    Time spent on care of this patient: 25 mins  Delfina Redwood , MD  [facility] Pager [phone]  Text Page:      [email or web address]      password [facility]  11/14/2014, 12:47 PM   LOS: 8 days

## 2014-11-14 NOTE — Consult Note (Signed)
Consultation Note Date: 11/14/2014   Patient Name: [name]  DOB: 1961-05-30  MRN: [id number]  Age / Sex: 54 y.o., female   PCP: [name], DO Referring Physician: Delfina Redwood, MD  Reason for Consultation: Establishing goals of care, Non pain symptom management, Pain control and Psychosocial/spiritual support  Palliative Care Assessment and Plan Summary of Established Goals of Care and Medical Treatment Preferences    Palliative Care Discussion Held Today:    This NP Wadie Lessen reviewed medical records, received report from team, assessed the patient and then meet at the patient's bedside along with her husband  to discuss diagnosis, prognosis, treatment options GOC, EOL wishes disposition and options.  A detailed discussion was had today regarding advanced directives.  Concepts specific to code status, artifical feeding and hydration, continued IV antibiotics and rehospitalization was had.  The difference between a aggressive medical intervention path  and a palliative comfort care path for this patient at this time was had.  Values and goals of care important to patient and family were attempted to be elicited.    Concept of Hospice and Palliative Care were discussed  Natural trajectory and expectations at EOL were discussed.  Questions and concerns addressed.  Hard Choices booklet left for review. Family encouraged to call with questions or concerns.  PMT will continue to support holistically.  MOST form introduced  Contacts/Participants in Discussion:   Patient and her husband and father  Primary Decision Maker:  Husband and father are shared HPOAs  Goals of Care/Code Status/Advance Care Planning:   Code Status: Full code-strongly encouraged to consider DNR status  Artificial feeding: no  Antibiotics: yes  Diagnostics: yes  Rehospitalization: yes  Family are open to offered and available radiation treatments to treat brain lesion.  Family encouraged to  continue today's conversation understanding that regardless of treatment decisions, patients's long term prognosis is limited.  Treatment will not change her current functional status and all treatment comes with risks of their own.  Psycho-social/Spiritual:    Support System: Husband and father  Desire for further Chaplaincy support:yes, they are involved at this time    Discharge Planning:  [place] for rehab with Palliative care service follow-up       Chief Complaint: decreased in mental status  History of Present Illness:   54 y.o. female with PMH of Hyperlipidemia, GERD, depression, anxiety, history of astrocytoma of brain (s/p excision and radiation therapy 2011), seizure, history of SDH and stroke with Left-sided muscle spasm, chronic kidney disease-III, hx of DVT, who presents with worsening mental status and jerking.  She was brought in via EMS from her nursing home due to worsening mental status. At baseline, she could say ' yes or no" to questioning. But since Saturday, she could not interact with staff as she normally did. She seems to be more confused.   In ED, patient was found to have WBC 4.3, temperature 99.7, no tachycardia, INR 1.2, PTT 29, positive urinalysis, troponin negative, AoCKD-III. Patient was admitted to inpatient for further evaluation and treatment.   IMPRESSION: Head Ct 11-05-14 Stable to slightly improved changes throughout the RIGHT hemisphere, reflecting previous surgery and radiation.  Possible new finding medial LEFT occipital lobe, slight hyperattenuation and possible associated vasogenic edema. Recurrent disease not excluded. Further evaluation recommended with MRI brain without and with contrast when the patient is stable.   Primary Diagnoses  Present on Admission:  . Stroke . Left spastic hemiparesis . Dyslipidemia . Depression with anxiety .  Acute renal failure superimposed on stage 3 chronic kidney disease .  Cerebral aneurysm, nonruptured . Astrocytoma brain tumor . Acute encephalopathy . GERD (gastroesophageal reflux disease) . UTI (lower urinary tract infection) . Brain lesion . Urinary tract infectious disease . HLD (hyperlipidemia) . Acute on chronic kidney failure . Depression . Altered mental state . Metastatic cancer to brain  Palliative Review of Systems:    -denies pain, reports weakness   I have reviewed the medical record, interviewed the patient and family, and examined the patient. The following aspects are pertinent.  Past Medical History  Diagnosis Date  . 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 Fracture, ankle 2012    right  . History of radiation therapy 09/23/10- 11/07/10    right parietal lobe  . Chronic headaches     uses oxycodone appropriately  . Anxiety   . Muscle spasticity   . Seizures   . Dysphagia   . GERD (gastroesophageal reflux disease)    History   Social History  . Marital Status: Married    Spouse Name: N/A  . Number of Children: 1  . Years of Education: N/A   Occupational History  .      unemployed   Social History Main Topics  . Smoking status: Never Smoker   . Smokeless tobacco: Never Used  . Alcohol Use: 0.0 oz/week    3-4 Cans of beer per week  . Drug Use: No  . Sexual Activity: No   Other Topics Concern  . None   Social History Narrative   Family History  Problem Relation Age of Onset  . Breast cancer Mother    Scheduled Meds: . antiseptic oral rinse  7 mL Mouth Rinse q12n4p  . ARIPiprazole  7 mg Oral Daily  . aspirin  325 mg Oral Daily  . atorvastatin  10 mg Oral Daily  . bisacodyl  10 mg Rectal BID  . chlorhexidine  15 mL Mouth Rinse BID  . dexamethasone  4 mg Oral BID  . FLUoxetine  60 mg Oral Daily  . gabapentin  300 mg Oral TID  . lacosamide  50 mg Oral BID  . lamoTRIgine  100 mg Oral Daily  . levETIRAcetam  500 mg Oral BID  . pantoprazole  40 mg Oral Daily  . senna-docusate  2  tablet Oral Daily   Continuous Infusions:  PRN Meds:.[DISCONTINUED] acetaminophen **OR** acetaminophen, feeding supplement (ENSURE ENLIVE) Medications Prior to Admission:  Prior to Admission medications   Medication Sig Start Date End Date Taking? Authorizing Provider  baclofen (LIORESAL) 10 MG tablet Take 10 mg by mouth 3 (three) times daily.   Yes Historical Provider, MD  Dextromethorphan-Quinidine 20-10 MG CAPS Take 1 capsule by mouth 2 (two) times daily.   Yes Historical Provider, MD  omeprazole (PRILOSEC) 20 MG capsule Take 20 mg by mouth daily.   Yes Historical Provider, MD  acetaminophen (TYLENOL) 325 MG tablet Take 650 mg by mouth 3 (three) times daily. For pain    Historical Provider, MD  ARIPiprazole (ABILIFY) 5 MG tablet Take 7 mg by mouth daily. For psychosis    Historical Provider, MD  aspirin 325 MG EC tablet Take 325 mg by mouth daily. For cerebral aneurysm    Historical Provider, MD  atorvastatin (LIPITOR) 10 MG tablet Take 10 mg by mouth daily at 6 PM. For lipoproteins    Historical Provider, MD  FLUoxetine (PROZAC) 20 MG capsule Take 60 mg by mouth daily. 07/12/13  Sheila Oats, MD  gabapentin (NEURONTIN) 300 MG capsule Take 300 mg by mouth 3 (three) times daily.    Historical Provider, MD  lacosamide (VIMPAT) 50 MG TABS tablet Take one tablet by mouth twice daily 07/17/14   Blanchie Serve, MD  lamoTRIgine (LAMICTAL) 100 MG tablet Take 150 mg by mouth daily. For mood disorder    Historical Provider, MD  levETIRAcetam (KEPPRA) 500 MG tablet Take 1 tablet (500 mg total) by mouth every 12 (twelve) hours. 01/25/13   Chauncey Cruel, MD  Multiple Vitamin (MULTIVITAMIN) tablet Take 1 tablet by mouth daily.      Historical Provider, MD   Allergies  Allergen Reactions  . Vasotec Other (See Comments)    Causes angioedema   CBC:    Component Value Date/Time   WBC 5.7 11/13/2014 0313   WBC 3.3* 12/07/2012 1552   HGB 10.7* 11/13/2014 0313   HGB 11.9 12/07/2012 1552   HCT  33.0* 11/13/2014 0313   HCT 34.6* 12/07/2012 1552   PLT 117* 11/13/2014 0313   PLT 97* 12/07/2012 1552   MCV 80.1 11/13/2014 0313   MCV 92.9 12/07/2012 1552   NEUTROABS 3.3 11/08/2014 0240   NEUTROABS 2.1 12/07/2012 1552   LYMPHSABS 0.8 11/08/2014 0240   LYMPHSABS 0.7* 12/07/2012 1552   MONOABS 0.1 11/08/2014 0240   MONOABS 0.3 12/07/2012 1552   EOSABS 0.0 11/08/2014 0240   EOSABS 0.1 12/07/2012 1552   BASOSABS 0.0 11/08/2014 0240   BASOSABS 0.0 12/07/2012 1552   Comprehensive Metabolic Panel:    Component Value Date/Time   NA 137 11/13/2014 0313   NA 144 12/07/2012 1552   K 3.8 11/13/2014 0313   K 4.0 12/07/2012 1552   CL 112* 11/13/2014 0313   CL 105 08/31/2012 1523   CO2 19* 11/13/2014 0313   CO2 31* 12/07/2012 1552   BUN 22* 11/13/2014 0313   BUN 12.2 12/07/2012 1552   CREATININE 1.19* 11/13/2014 0313   CREATININE 1.0 12/07/2012 1552   GLUCOSE 148* 11/13/2014 0313   GLUCOSE 112 12/07/2012 1552   GLUCOSE 97 08/31/2012 1523   CALCIUM 9.6 11/13/2014 0313   CALCIUM 10.4 12/07/2012 1552   AST 59* 11/07/2014 0217   AST 56* 12/07/2012 1552   ALT 43 11/07/2014 0217   ALT 27 12/07/2012 1552   ALKPHOS 75 11/07/2014 0217   ALKPHOS 127 12/07/2012 1552   BILITOT 0.6 11/07/2014 0217   BILITOT 1.08 12/07/2012 1552   PROT 6.1* 11/07/2014 0217   PROT 6.5 12/07/2012 1552   ALBUMIN 3.2* 11/07/2014 0217   ALBUMIN 3.2* 12/07/2012 1552    Physical Exam:  Vital Signs: BP 115/63 mmHg  Pulse 64  Temp(Src) 99.2 F (37.3 C) (Oral)  Resp 16  Ht 5\' 9"  (1.753 m)  Wt 71.759 kg (158 lb 3.2 oz)  BMI 23.35 kg/m2  SpO2 98%  LMP 08/26/2010 SpO2: SpO2: 98 % O2 Device: O2 Device: Not Delivered O2 Flow Rate: O2 Flow Rate (L/min): 1 L/min Intake/output summary: No intake or output data in the 24 hours ending 11/14/14 0859 LBM: Last BM Date: 11/12/14 Baseline Weight: Weight: 66.1 kg (145 lb 11.6 oz) Most recent weight: Weight: 71.759 kg (158 lb 3.2 oz)  Exam Findings:   General:  ill appearing, bedbound HEENT: moist buccal membranes CVS: RRR Resp: decreased in bases Extrem: noted tremor right hand Skin: warm and dry Psych: patient is unable to speak to her medical condition and voices her belief that she is not sick  Palliative Performance Scale: 30  % at best                Additional Data Reviewed: Recent Labs     11/13/14  0313  WBC  5.7  HGB  10.7*  PLT  117*  NA  137  BUN  22*  CREATININE  1.19*     Time In: 1130 Time Out: 1300 Time Total: 90 min  Greater than 50%  of this time was spent counseling and coordinating care related to the above assessment and plan.  Discussed with  Dr Conley Canal  Signed by: Wadie Lessen, NP  Knox Royalty, NP  11/14/2014, 8:59 AM   Please contact Palliative Medicine Team phone at [phone] for questions and concerns.   See AMION for contact information

## 2014-11-14 NOTE — Progress Notes (Signed)
Patient ID: [name], female   DOB: [birth year], 54 y.o.   MRN: [id number] Neuro stable. Radiation oncology to see her. If she needs radiosurgery dr Kathyrn Sheriff will take over

## 2014-11-15 ENCOUNTER — Telehealth: Payer: Self-pay | Admitting: Radiation Oncology

## 2014-11-15 ENCOUNTER — Inpatient Hospital Stay (HOSPITAL_COMMUNITY): Payer: Medicare Other

## 2014-11-15 DIAGNOSIS — Z7189 Other specified counseling: Secondary | ICD-10-CM | POA: Insufficient documentation

## 2014-11-15 DIAGNOSIS — Z515 Encounter for palliative care: Secondary | ICD-10-CM | POA: Insufficient documentation

## 2014-11-15 LAB — GLUCOSE, CAPILLARY
GLUCOSE-CAPILLARY: 119 mg/dL — AB (ref 65–99)
GLUCOSE-CAPILLARY: 112 mg/dL — AB (ref 65–99)
Glucose-Capillary: 124 mg/dL — ABNORMAL HIGH (ref 65–99)

## 2014-11-15 LAB — CBC
HCT: 30.2 % — ABNORMAL LOW (ref 36.0–46.0)
HEMOGLOBIN: 9.9 g/dL — AB (ref 12.0–15.0)
MCH: 25.7 pg — ABNORMAL LOW (ref 26.0–34.0)
MCHC: 32.8 g/dL (ref 30.0–36.0)
MCV: 78.4 fL (ref 78.0–100.0)
Platelets: 124 10*3/uL — ABNORMAL LOW (ref 150–400)
RBC: 3.85 MIL/uL — ABNORMAL LOW (ref 3.87–5.11)
RDW: 15.4 % (ref 11.5–15.5)
WBC: 5.7 10*3/uL (ref 4.0–10.5)

## 2014-11-15 LAB — BASIC METABOLIC PANEL
ANION GAP: 5 (ref 5–15)
BUN: 27 mg/dL — ABNORMAL HIGH (ref 6–20)
CHLORIDE: 106 mmol/L (ref 101–111)
CO2: 21 mmol/L — ABNORMAL LOW (ref 22–32)
Calcium: 9.2 mg/dL (ref 8.9–10.3)
Creatinine, Ser: 1.28 mg/dL — ABNORMAL HIGH (ref 0.44–1.00)
GFR calc non Af Amer: 46 mL/min — ABNORMAL LOW (ref >60)
GFR, EST AFRICAN AMERICAN: 53 mL/min — AB (ref >60)
Glucose, Bld: 116 mg/dL — ABNORMAL HIGH (ref 65–99)
Potassium: 4.1 mmol/L (ref 3.5–5.1)
SODIUM: 132 mmol/L — AB (ref 135–145)

## 2014-11-15 MED ORDER — POLYETHYLENE GLYCOL 3350 17 G PO PACK
17.0000 g | PACK | Freq: Two times a day (BID) | ORAL | Status: DC
  Administered 2014-11-15 – 2014-11-16 (×2): 17 g via ORAL
  Filled 2014-11-15 (×2): qty 1

## 2014-11-15 MED ORDER — GABAPENTIN 300 MG PO CAPS: 300.0000 mg | ORAL_CAPSULE | Freq: Every day | ORAL | Status: DC

## 2014-11-15 MED ORDER — SODIUM CHLORIDE 0.9 % IV SOLN
INTRAVENOUS | Status: DC
  Administered 2014-11-15 – 2014-11-16 (×3): via INTRAVENOUS

## 2014-11-15 MED ORDER — SODIUM CHLORIDE 0.9 % IV BOLUS (SEPSIS)
500.0000 mL | Freq: Once | INTRAVENOUS | Status: AC
  Administered 2014-11-15: 500 mL via INTRAVENOUS

## 2014-11-15 NOTE — Progress Notes (Signed)
Pt alert, verbal refusing medication this am. She also refused breakfast stating, " I don't feel like taking them, I dont feel good."  Pt was unable to express how she didn't feel good. Pt stable, vitals assessed. No noted distress.  Reported to Md was instructed to encourage pt to at least take Decadron and Senokot. Pt agreed with her husband at bedside who also encouraged her to take medication. Pt continued refused all other po medication. Will try another attempt at a later time.

## 2014-11-15 NOTE — Clinical Social Work Note (Signed)
CSW notified that patient is not medically stable for discharge today. Patient is a long-term resident of [facility] and plans to return once medically cleared.   CSW will continue to follow pt and pt's family for continued support and to facilitate pt's discharge needs once medically stable.   FL-2 signed.   Glendon Axe, MSW, LCSWA [phone] 11/15/2014 1:42 PM

## 2014-11-15 NOTE — Progress Notes (Signed)
Progress Note  [name] ZJQ:[id number] DOB: January 19, 1961 DOA: 11/05/2014 PCP: [name], DO  Admit HPI / Brief Narrative: 54 y.o. 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  Presents with worsening mental status and jerking. She was brought in via EMS from her nursing home due to worsening mental status. At baseline, she could say ' yes or no" to questioning. But since Saturday, she could not interact with staff as she normally did. She seems to be more confused.   In ED, patient was found to have WBC 4.3, temperature 99.7, no tachycardia, INR 1.2, PTT 29, positive urinalysis, troponin negative, AoCKD-III.  Assessment/Plan: Acute encephalopathy More lethargic today. Hold gabapentin. Check CBC, bmet. Monitor. Not ready for discharge  New 11 x 9 x 11 mm rim enhancing lesion within the medial left occipital lobe/ Hx of astrocytoma -Dexamethasone taper per neuro D/w Dr. [name].  Dr. Tresa Moore is off. [place] for discharge and f/u next week as outpt.   Colonic ileus: films improving. Continue bowel regimen  Dysphagia?? ST rec D3 thins  Left spastic hemiparesis Previous astrocytoma/XRT/resection  UTI S/p rx  Seizure -EEG ok -Continue Vimpat 50 mg BID; -Continue Lamictal 100 mg daily -Continue Keppra 500 mg BID  HLD  History of stroke  Acute on CKD stage 3 improved  GERD  Depression -Continue Abilify 7 mg daily  Pancytopenia Likely infection related. stable   Code Status: FULL Family Communication:  Disposition Plan: SNF tomorrow if stable   Consultants: Dr.Leslie Reynolds Neurosurgery Rad onc PMT  Procedure/Significant Events: 6/6 EEG;-Abnormal EEG due to generalized slowing indicating a mild to moderate cerebral disturbance (encephalopathy). Intermittent focal slowing is noted in the left posterior  region indicating a potential seizure foci. 6/6 MRI brain with and without contrast;-New 11 x 9 x 11 mm rim enhancing lesion within the medial left occipital lobe with associated localized vasogenic edema.  -7 mm focus of nodular enhancement adjacent to the dilated body of the right lateral ventricle which is similar to previous exam. -Remote right MCA territory infarcts  6/9 EEG;abnormal EEG secondary to general background slowing  Antibiotics: Rocephin 6/6>>6/12  Continuous Infusions: . sodium chloride 100 mL/hr at 11/15/14 1345   HPI/Subjective: Sleepy. Told RN she doesn't feel well. Refusing meds, refusing to eat  Objective: VITAL SIGNS: Temp: 98.3 F (36.8 C) (06/15 2048) Temp Source: Oral (06/15 2048) BP: 128/63 mmHg (06/15 2048) Pulse Rate: 57 (06/15 2048) SPO2; FIO2:  No intake or output data in the 24 hours ending 11/15/14 2220   Exam: General: asleep Lungs: Clear to auscultation bilaterally without wheezes or crackles Cardiovascular: Regular rate and rhythm without murmur gallop or rub normal S1 and S2 Abdomen: s, distended. nontender Extremities: No significant cyanosis, clubbing, or edema bilateral lower extremities  Data Reviewed: Basic Metabolic Panel:  Recent Labs Lab 11/09/14 0247 11/11/14 0350 11/13/14 0313 11/15/14 1126  NA 140 143 137 132*  K 3.7 3.9 3.8 4.1  CL 115* 116* 112* 106  CO2 15* 18* 19* 21*  GLUCOSE 109* 108* 148* 116*  BUN 27* 26* 22* 27*  CREATININE 1.18* 1.18* 1.19* 1.28*  CALCIUM 9.7 9.9 9.6 9.2   Liver Function Tests: No results for input(s): AST, ALT, ALKPHOS, BILITOT, PROT, ALBUMIN in the last 168 hours. No results for input(s): LIPASE, AMYLASE in the last 168 hours.  Recent Labs Lab 11/11/14 0350  AMMONIA 30  CBC:  Recent Labs Lab 11/09/14 0247 11/11/14 0350 11/13/14 0313 11/15/14 1126  WBC 5.0 3.6* 5.7 5.7  HGB 8.8* 9.7* 10.7* 9.9*  HCT 27.5* 29.9* 33.0* 30.2*  MCV 82.1 79.7 80.1 78.4  PLT 115* 98*  117* 124*   Cardiac Enzymes: No results for input(s): CKTOTAL, CKMB, CKMBINDEX, TROPONINI in the last 168 hours. BNP (last 3 results) No results for input(s): BNP in the last 8760 hours.  ProBNP (last 3 results) No results for input(s): PROBNP in the last 8760 hours.  CBG:  Recent Labs Lab 11/14/14 0846 11/14/14 1648 11/15/14 0009 11/15/14 0804 11/15/14 1651  GLUCAP 132* 127* 124* 119* 112*    Recent Results (from the past 240 hour(s))  Culture, blood (x 2)     Status: None   Collection Time: 11/06/14  4:35 AM  Result Value Ref Range Status   Specimen Description BLOOD LEFT ARM  Final   Special Requests BAA 5ML  Final   Culture   Final    NO GROWTH 5 DAYS Performed at [facility]    Report Status 11/12/2014 FINAL  Final  Culture, blood (x 2)     Status: None   Collection Time: 11/06/14  7:30 AM  Result Value Ref Range Status   Specimen Description BLOOD LEFT ASSIST CONTROL  Final   Special Requests BAA 10ML  Final   Culture   Final    NO GROWTH 5 DAYS Note: Culture results may be compromised due to an excessive volume of blood received in culture bottles. Performed at [facility]    Report Status 11/12/2014 FINAL  Final  Urine culture     Status: None   Collection Time: 11/06/14  7:50 AM  Result Value Ref Range Status   Specimen Description URINE, CLEAN CATCH  Final   Special Requests NONE  Final   Colony Count   Final    >=100,000 COLONIES/ML Performed at [facility]    Culture   Final    ESCHERICHIA COLI Performed at [facility]    Report Status 11/08/2014 FINAL  Final   Organism ID, Bacteria ESCHERICHIA COLI  Final      Susceptibility   Escherichia coli - MIC*    AMPICILLIN >=32 RESISTANT Resistant     CEFAZOLIN <=4 SENSITIVE Sensitive     CEFTRIAXONE <=1 SENSITIVE Sensitive     CIPROFLOXACIN <=0.25 SENSITIVE Sensitive     GENTAMICIN <=1 SENSITIVE Sensitive     LEVOFLOXACIN <=0.12 SENSITIVE Sensitive      NITROFURANTOIN <=16 SENSITIVE Sensitive     TOBRAMYCIN <=1 SENSITIVE Sensitive     TRIMETH/SULFA <=20 SENSITIVE Sensitive     PIP/TAZO <=4 SENSITIVE Sensitive     * ESCHERICHIA COLI  MRSA PCR Screening     Status: None   Collection Time: 11/06/14  3:45 PM  Result Value Ref Range Status   MRSA by PCR NEGATIVE NEGATIVE Final    Comment:        The GeneXpert MRSA Assay (FDA approved for NASAL specimens only), is one component of a comprehensive MRSA colonization surveillance program. It is not intended to diagnose MRSA infection nor to guide or monitor treatment for MRSA infections.        Scheduled Meds:  Scheduled Meds: . antiseptic oral rinse  7 mL Mouth Rinse q12n4p  . ARIPiprazole  7 mg Oral Daily  . aspirin  325 mg Oral Daily  . atorvastatin  10 mg Oral Daily  . bisacodyl  10  mg Rectal BID  . chlorhexidine  15 mL Mouth Rinse BID  . dexamethasone  4 mg Oral BID  . FLUoxetine  60 mg Oral Daily  . [START ON 11/16/2014] gabapentin  300 mg Oral QHS  . lacosamide  50 mg Oral BID  . lamoTRIgine  100 mg Oral Daily  . levETIRAcetam  500 mg Oral BID  . pantoprazole  40 mg Oral Daily  . polyethylene glycol  17 g Oral BID    Time spent on care of this patient: 25 mins  Delfina Redwood , MD  [facility] Pager [phone]  Text Page:      [email or web address]      password [email or web address]  11/15/2014, 10:20 PM   LOS: 9 days

## 2014-11-15 NOTE — Telephone Encounter (Signed)
Received sticky noted with patient's name and patient's father's name on it. Also, the father's phone number was on the note. Called patient's father's home number. No answer. Left message requesting return call with future needs.

## 2014-11-16 LAB — GLUCOSE, CAPILLARY
Glucose-Capillary: 104 mg/dL — ABNORMAL HIGH (ref 65–99)
Glucose-Capillary: 119 mg/dL — ABNORMAL HIGH (ref 65–99)

## 2014-11-16 MED ORDER — LAMOTRIGINE 100 MG PO TABS: 100.0000 mg | ORAL_TABLET | Freq: Every day | ORAL | Status: DC

## 2014-11-16 MED ORDER — POLYETHYLENE GLYCOL 3350 17 G PO PACK: 17.0000 g | PACK | Freq: Every day | ORAL | Status: AC

## 2014-11-16 MED ORDER — DEXAMETHASONE 2 MG PO TABS: ORAL_TABLET | ORAL | Status: DC

## 2014-11-16 MED ORDER — GABAPENTIN 300 MG PO CAPS: 300.0000 mg | ORAL_CAPSULE | Freq: Every day | ORAL | Status: DC

## 2014-11-16 MED ORDER — BISACODYL 10 MG RE SUPP: 10.0000 mg | Freq: Every day | RECTAL | Status: DC | PRN

## 2014-11-16 MED ORDER — BACLOFEN 10 MG PO TABS: 10.0000 mg | ORAL_TABLET | Freq: Three times a day (TID) | ORAL | Status: DC | PRN

## 2014-11-16 NOTE — Progress Notes (Signed)
Patient ID: [name], female   DOB: 05/08/1961, 54 y.o.   MRN: [id number] If a decision for radiosurgery is made please let dr Kathyrn Sheriff know.

## 2014-11-16 NOTE — Clinical Social Work Note (Signed)
Clinical Social Worker facilitated patient discharge including contacting patient family and facility to confirm patient discharge plans.  Clinical information faxed to facility and family agreeable with plan.  CSW arranged ambulance transport via [facility] to [facility].  RN to call report prior to discharge.  DC summary prepared and on chart for transport.   Clinical Social Worker will sign off for now as social work intervention is no longer needed. Please consult [place] again if new need arises.  Glendon Axe, MSW, LCSWA [phone] 11/16/2014 10:17 AM

## 2014-11-16 NOTE — Progress Notes (Signed)
Pt discharging at this time via stretcher [facility] transport to [facility].  IV discontinued, dry dressing applied. Discharge paperwork sent with pt. Pt alert, verbal with no noted distress.  Report called in to nurse at facility.

## 2014-11-16 NOTE — Discharge Summary (Signed)
Physician Discharge Summary  [name] WER:[id number] DOB: 07-03-1960 DOA: 11/05/2014  PCP: [name], DO  Admit date: 11/05/2014 Discharge date: 11/16/2014  Time spent: greater than 30 minutes 1. Dr. [name] to discuss treatment options (if any) for new brain lesion as outpatient. Office will schedule appointment  Discharge Diagnoses:  Principal Problem:   Acute encephalopathy likely secondary to UTI Active Problems:  History of Astrocytoma brain tumor History of  SAH (subarachnoid hemorrhage) History of  Cerebral aneurysm, nonruptured History of  Seizures   Dysphagia   Acute renal failure superimposed on stage 3 chronic kidney disease   Left spastic hemiparesis   History of DVT (deep vein thrombosis)   Dyslipidemia   GERD (gastroesophageal reflux disease)   UTI (lower urinary tract infection)   Brain lesion    HLD (hyperlipidemia)   Acute on chronic kidney failure   Depression   colonic Ileus  Discharge Condition: Stable  Diet recommendation: Mechanical soft  CODE STATUS full code  [facility] Weights   11/06/14 1522 11/08/14 1703  Weight: 66.1 kg (145 lb 11.6 oz) 71.759 kg (158 lb 3.2 oz)    History of present illness/Hospital Course:  54 y.o. 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  Presents with worsening mental status and jerking. She was brought in via EMS from her nursing home due to worsening mental status. At baseline, she could say ' yes or no" to questioning. But since Saturday, she could not interact with staff as she normally did. She seems to be more confused.   In ED, patient was found to have WBC 4.3, temperature 99.7, no tachycardia, INR 1.2, PTT 29, positive urinalysis, troponin negative, AoCKD-III.  Assessment/Plan: Acute encephalopathy -Likely toxic metabolic encephalopathy related to  Escherichia coli pyelonephritis. Improved and appears to be at baseline  New 11 x 9 x 11 mm rim enhancing lesion within the medial left occipital lobe/ Hx of astrocytoma Neurology, neurosurgery and radiation oncology consulted started on Decadron. Dexamethasone taper per neuro CT of chest abdomen and pelvis did not show primary lesion, and unlikely to be recurrence of astrocytoma as on the contralateral oral side. Palliative care discussed with family and they would like to continue full CODE STATUS, full treatment options. Dr. [name] will discuss with patient and family and decide as an outpatient whether she is a candidate for treatment. She is bedbound and minimally interactive, so this is unclear at this time.  Colonic ileus: During hospitalization, patient was eating and stooling, but nursing noted abdominal distention. She had good bowel sounds but was in fact distended. X-ray showed colonic ileus. I suspect chronic component. She was started on a vigorous bowel regimen, her stool output picked up and her abdominal distention improved. Serial x-rays showed continued improvement though not complete resolution. She will need an aggressive bowel regimen to be continued at skilled nursing facility. She is tolerating a diet has no complaints of abdominal pain at this time.   Dysphagia Seen by therapy who recommended mechanical soft diet and thin liquids.  Left spastic hemiparesis Previous astrocytoma/XRT/resection.  Due to encephalopathy, any potentially sedating medications were adjusted and her baclofen has been changed to as needed. She's not had problems off the medication  UTI Culture grew out Escherichia coli and she completed a 6 day course of Rocephin.  Seizure -EEG without epileptiform activity. -Continue Vimpat 50 mg BID; Lamictal decreased to 100  mg a day. -Continue Keppra 500 mg BID  Acute on CKD stage 3 improved  Pancytopenia Likely infection related. improving  Patient is  alert, oriented, tolerating diet with normal vital signs and is stable for transfer back to skilled nursing facility.  Consultants: neurology Neurosurgery Rad onc Palliative medicine  Studies 6/6 EEG;-Abnormal EEG due to generalized slowing indicating a mild to moderate cerebral disturbance (encephalopathy). Intermittent focal slowing is noted in the left posterior region indicating a potential seizure foci. 6/9 EEG;abnormal EEG secondary to general background slowing   Procedures:  None  Discharge Exam: Filed Vitals:   11/16/14 0615  BP: 110/60  Pulse: 58  Temp: 98.2 F (36.8 C)  Resp: 18    General: Alert, eating breakfast. Answers questions appropriately and 1 word answers. Cardiovascular: Regular rate rhythm without murmurs gallops rubs Respiratory: Clear to auscultation bilaterally without wheezes rhonchi or rales Abdomen: Less distended. Soft, nontender. Bowel sounds present. Extremities trace edema.  Discharge Instructions   Discharge Instructions    Discharge instructions    Complete by:  As directed   Mechanical soft diet     Walk with assistance    Complete by:  As directed           Current Discharge Medication List    START taking these medications   Details  bisacodyl (DULCOLAX) 10 MG suppository Place 1 suppository (10 mg total) rectally daily as needed for moderate constipation. Qty: 12 suppository, Refills: 0    dexamethasone (DECADRON) 2 MG tablet 2 mg po bid x 5 days, then 2 mg daily x5 days, then stop    polyethylene glycol (MIRALAX / GLYCOLAX) packet Take 17 g by mouth daily. Qty: 14 each, Refills: 0      CONTINUE these medications which have CHANGED   Details  baclofen (LIORESAL) 10 MG tablet Take 1 tablet (10 mg total) by mouth 3 (three) times daily as needed for muscle spasms. Qty: 30 each, Refills: 0    gabapentin (NEURONTIN) 300 MG capsule Take 1 capsule (300 mg total) by mouth at bedtime.    lamoTRIgine (LAMICTAL) 100 MG  tablet Take 1 tablet (100 mg total) by mouth daily. For mood disorder      CONTINUE these medications which have NOT CHANGED   Details  omeprazole (PRILOSEC) 20 MG capsule Take 20 mg by mouth daily.    acetaminophen (TYLENOL) 325 MG tablet Take 650 mg by mouth 3 (three) times daily. For pain    ARIPiprazole (ABILIFY) 5 MG tablet Take 7 mg by mouth daily. For psychosis    aspirin 325 MG EC tablet Take 325 mg by mouth daily. For cerebral aneurysm    atorvastatin (LIPITOR) 10 MG tablet Take 10 mg by mouth daily at 6 PM. For lipoproteins    FLUoxetine (PROZAC) 20 MG capsule Take 60 mg by mouth daily.    lacosamide (VIMPAT) 50 MG TABS tablet Take one tablet by mouth twice daily Qty: 60 tablet, Refills: 5    levETIRAcetam (KEPPRA) 500 MG tablet Take 1 tablet (500 mg total) by mouth every 12 (twelve) hours. Qty: 60 tablet, Refills: 5   Associated Diagnoses: Malignant neoplasm of brain, unspecified site    Multiple Vitamin (MULTIVITAMIN) tablet Take 1 tablet by mouth daily.        STOP taking these medications     Dextromethorphan-Quinidine 20-10 MG CAPS        Allergies  Allergen Reactions  . Vasotec Other (See Comments)    Causes angioedema   Follow-up  Information    Follow up with [name], DO.   Specialty:  Internal Medicine   Contact information:   [place] [id number] [phone]        The results of significant diagnostics from this hospitalization (including imaging, microbiology, ancillary and laboratory) are listed below for reference.    Significant Diagnostic Studies: Dg Abd 1 View  11/15/2014   CLINICAL DATA:  Abdominal bloating.  Ileus.  EXAM: ABDOMEN - 1 VIEW  COMPARISON:  11/14/2014  FINDINGS: Diffuse dilatation of the colon shows mild improvement since previous study. Contrast again seen in the ascending and transverse portions. No gas is seen within the rectum on today's study. This could be due to a ileus although a distal  colonic obstruction cannot definitely be excluded.  IMPRESSION: Mild improvement in diffuse colonic dilatation. This could represent a colonic ileus, although distal colonic obstruction cannot definitely be excluded. Recommend continued radiographic follow-up versus abdomen pelvis CT with contrast for further evaluation.   Electronically Signed   By: Earle Gell M.D.   On: 11/15/2014 12:30   Dg Abd 1 View  11/14/2014   CLINICAL DATA:  54 year old female with a history of abdominal bloating. No pain.  EXAM: ABDOMEN - 1 VIEW  COMPARISON:  CT 11/08/2014  FINDINGS: Gaseous distention of the distal colon, with significant degree of formed stool in the right colon/ cecum. Gas extends to the rectum. Compared to the prior plain film, there is decreased diameter of the colon.  Multiple borderline dilated small bowel loops, similar to the comparison study.  No significant retention of enteric contrast.  No definite evidence of free air.  Vascular calcifications.  No displaced fracture.  Mild scoliotic curvature of the visualized spine.  IMPRESSION: Borderline dilated small bowel and colon, favored to represent ileus as gas extends to the rectum, with decreased caliber of colon from the comparison plain film.  Similar appearance of moderate to large stool burden, involving the right colon and transverse colon, may suggest constipation.  Signed,  Dulcy Fanny. Earleen Newport, DO  [facility]  [facility]   Electronically Signed   By: Corrie Mckusick D.O.   On: 11/14/2014 13:53   Dg Abd 1 View  11/13/2014   CLINICAL DATA:  Abdominal pain and distension  EXAM: ABDOMEN - 1 VIEW  COMPARISON:  11/08/2014  FINDINGS: Scattered large and small bowel gas is noted. Gaseous distension of the transverse colon is seen. Fecal material is noted throughout the right colon. This pattern may represent a diffuse colonic ileus. Correlation with the physical exam is recommended. No free air is seen.   IMPRESSION: Changes suggestive of a colonic ileus   Electronically Signed   By: Inez Catalina M.D.   On: 11/13/2014 10:17   Ct Head Wo Contrast  11/05/2014   CLINICAL DATA:  Altered mental status.  EXAM: CT HEAD WITHOUT CONTRAST  TECHNIQUE: Contiguous axial images were obtained from the base of the skull through the vertex without intravenous contrast.  COMPARISON:  Multiple priors.  Most recent MR 07/28/2014.  FINDINGS: Surgical encephalomalacia RIGHT parietal lobe. Post treatment changes with hypoattenuation of the cortex and white matter throughout the RIGHT hemisphere. Slight thickening of the posterior aspect to the resection cavity, may be slightly involuted compared with priors.  Slight hyperattenuation of a 5 mm area of medial LEFT occipital lobe gyrus as seen on 18 and 19, not clearly present on priors. There may be slight associated occipital vasogenic surrounding edema.  Recurrent disease not excluded on this noncontrast scan.  Calvarium intact. No acute findings in the orbits, sinuses, or mastoids.  IMPRESSION: Stable to slightly improved changes throughout the RIGHT hemisphere, reflecting previous surgery and radiation.  Possible new finding medial LEFT occipital lobe, slight hyperattenuation and possible associated vasogenic edema. Recurrent disease not excluded. Further evaluation recommended with MRI brain without and with contrast when the patient is stable.   Electronically Signed   By: Rolla Flatten M.D.   On: 11/05/2014 23:12   Ct Chest W Contrast  11/08/2014   CLINICAL DATA:  New brain lesion. Evaluation for metastatic disease. Abdominal distention.  EXAM: CT CHEST, ABDOMEN, AND PELVIS WITH CONTRAST  TECHNIQUE: Multidetector CT imaging of the chest, abdomen and pelvis was performed following the standard protocol during bolus administration of intravenous contrast.  CONTRAST:  37mL OMNIPAQUE IOHEXOL 300 MG/ML  SOLN  COMPARISON:  08/15/2010  FINDINGS: CT CHEST FINDINGS  No enlarged axillary,  mediastinal, or hilar lymph nodes are identified. Pulmonary arterial enlargement is similar to the prior study, with the main pulmonary artery measuring approximately 3.7 cm in diameter and suggestive of underlying pulmonary arterial hypertension. The heart is mildly enlarged. There is a small right pleural effusion.  Evaluation of the lung parenchyma is mildly limited by motion artifact. Opacities in the dependent portions of the lower greater than upper lobes bilaterally are favored to represent atelectasis. Major airways are patent. No lung mass or nodules are identified. No lytic or blastic osseous lesions are identified.  CT ABDOMEN AND PELVIS FINDINGS  Evaluation of the upper abdomen is mildly limited by motion artifact. The liver, gallbladder, spleen, adrenal glands, and pancreas are grossly unremarkable. No renal mass or hydronephrosis is identified. A few punctate foci of hyperattenuation in the renal sinuses bilaterally may represent tiny nonobstructing calculi.  Oral contrast is present in multiple loops of nondilated small bowel without evidence of obstruction. There is a moderate amount of stool in the ascending and descending colon. There is mild gaseous distension of the transverse colon to approximately 5.5 cm in diameter, with mild gaseous distention of a portion of the sigmoid colon as well. There is a moderate amount of liquid stool in the distal sigmoid colon and rectum. No gross bowel wall thickening is identified.  Bladder is moderately distended. Uterus is identified. No pelvic mass is seen. Mild atherosclerotic vascular calcification is noted. No free fluid or enlarged lymph nodes are identified. No suspicious lytic or blastic osseous lesions are identified. Facet arthrosis is noted in the lower lumbar spine, severe on the right at L5-S1 with facet joint ankylosis.  IMPRESSION: 1. No evidence of malignancy in the chest, abdomen, or pelvis. 2. Trace right pleural effusion. 3. Dependent  opacities in both lungs, favored to represent atelectasis. 4. No evidence of bowel obstruction. Moderate amount of colonic stool, including liquid stool in the distal colon and rectum. Mild gaseous distention of the transverse colon.   Electronically Signed   By: Logan Bores   On: 11/08/2014 17:07   Mr Brain W Wo Contrast  11/06/2014   CLINICAL DATA:  Initial evaluation for altered mental status. History of prior right parietal brain tumor.  EXAM: MRI HEAD WITHOUT AND WITH CONTRAST  TECHNIQUE: Multiplanar, multiecho pulse sequences of the brain and surrounding structures were obtained without and with intravenous contrast.  CONTRAST:  73mL MULTIHANCE GADOBENATE DIMEGLUMINE 529 MG/ML IV SOLN  COMPARISON:  Prior CT from 11/05/2014 as well as previous MRI from 07/28/2014.  FINDINGS: Study is  moderately degraded by motion artifact.  The right parietal resection cavity is similar in size relative to previous study. Restricted diffusion at the superior aspect of the cavity continues to diminish. Layering hematocrit level within the resection cavity again noted. Irregular enhancement along the superior margin of the resection cavity is grossly stable. Additionally, 7 mm focus of nodular enhancement within the periventricular white matter adjacent to the dilated body of the right lateral ventricles also grossly similar (series 16, image 32).  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  Dural thickening with enhancement overlying the right cerebral convexity is similar. Extensive T2 changes throughout the right hemisphere are similar to previous study as well.  Ex vacuo dilatation of the right lateral ventricle again noted, stable. Slight left-to-right midline shift also unchanged. Mild periventricular white matter changes on the left are similar.  Normal intravascular flow voids are maintained.  No acute infarct.  There is a new heterogeneous Truman Hayward enhancing lesion within the medial left  occipital lobe measuring 11 x 9 x 11 mm (AP by transverse by craniocaudad). There is localized vasogenic edema without significant mass effect.  Craniocervical junction within normal limits. Pituitary gland normal.  No acute abnormality about the orbits.  Mild mucosal thickening present within the max O sinuses and ethmoidal air cells.  Mastoid air cells are clear.  Inner ear structures normal.  IMPRESSION: 1. New 11 x 9 x 11 mm rim enhancing lesion within the medial left occipital lobe with associated localized vasogenic edema. Finding is worrisome for possible recurrent disease, although this would be somewhat unusual to reoccur in the contralateral cerebral hemisphere for a tumor that usually demonstrates local recurrence. Metastatic disease is also in the differential, although no other new lesions are identified. There is a 7 mm focus of nodular enhancement adjacent to the dilated body of the right lateral ventricle which is similar to previous exam. 2. Grossly stable postoperative changes within the right parietal lobe with persistent hematocrit level and some enhancement along the superior margin of the resection cavity. 3. Remote right MCA territory infarcts surrounding the resection cavity with associated laminar necrosis.   Electronically Signed   By: Jeannine Boga M.D.   On: 11/06/2014 06:52   Ct Abdomen Pelvis W Contrast  11/08/2014   CLINICAL DATA:  New brain lesion. Evaluation for metastatic disease. Abdominal distention.  EXAM: CT CHEST, ABDOMEN, AND PELVIS WITH CONTRAST  TECHNIQUE: Multidetector CT imaging of the chest, abdomen and pelvis was performed following the standard protocol during bolus administration of intravenous contrast.  CONTRAST:  29mL OMNIPAQUE IOHEXOL 300 MG/ML  SOLN  COMPARISON:  08/15/2010  FINDINGS: CT CHEST FINDINGS  No enlarged axillary, mediastinal, or hilar lymph nodes are identified. Pulmonary arterial enlargement is similar to the prior study, with the main  pulmonary artery measuring approximately 3.7 cm in diameter and suggestive of underlying pulmonary arterial hypertension. The heart is mildly enlarged. There is a small right pleural effusion.  Evaluation of the lung parenchyma is mildly limited by motion artifact. Opacities in the dependent portions of the lower greater than upper lobes bilaterally are favored to represent atelectasis. Major airways are patent. No lung mass or nodules are identified. No lytic or blastic osseous lesions are identified.  CT ABDOMEN AND PELVIS FINDINGS  Evaluation of the upper abdomen is mildly limited by motion artifact. The liver, gallbladder, spleen, adrenal glands, and pancreas are grossly unremarkable. No renal mass or hydronephrosis is identified. A few punctate foci of hyperattenuation in  the renal sinuses bilaterally may represent tiny nonobstructing calculi.  Oral contrast is present in multiple loops of nondilated small bowel without evidence of obstruction. There is a moderate amount of stool in the ascending and descending colon. There is mild gaseous distension of the transverse colon to approximately 5.5 cm in diameter, with mild gaseous distention of a portion of the sigmoid colon as well. There is a moderate amount of liquid stool in the distal sigmoid colon and rectum. No gross bowel wall thickening is identified.  Bladder is moderately distended. Uterus is identified. No pelvic mass is seen. Mild atherosclerotic vascular calcification is noted. No free fluid or enlarged lymph nodes are identified. No suspicious lytic or blastic osseous lesions are identified. Facet arthrosis is noted in the lower lumbar spine, severe on the right at L5-S1 with facet joint ankylosis.  IMPRESSION: 1. No evidence of malignancy in the chest, abdomen, or pelvis. 2. Trace right pleural effusion. 3. Dependent opacities in both lungs, favored to represent atelectasis. 4. No evidence of bowel obstruction. Moderate amount of colonic stool,  including liquid stool in the distal colon and rectum. Mild gaseous distention of the transverse colon.   Electronically Signed   By: Logan Bores   On: 11/08/2014 17:07   US Renal  11/06/2014   CLINICAL DATA:  Acute kidney injury.  EXAM: RENAL / URINARY TRACT ULTRASOUND COMPLETE  COMPARISON:  None.  FINDINGS: Right Kidney:  Length: 8.0 cm. Increased parenchymal echogenicity. No mass or hydronephrosis visualized. Technically limited evaluation.  Left Kidney:  Length: 7.7 cm. Increased parenchymal echogenicity. No mass or hydronephrosis visualized. Technically limited evaluation.  Bladder:  Appears normal for degree of bladder distention.  IMPRESSION: Increased renal echogenicity consistent with chronic medical renal disease. No obstructive uropathy. Technically limited evaluation of the renal parenchyma for fine detail.   Electronically Signed   By: Jeb Levering M.D.   On: 11/06/2014 06:44   Dg Chest Port 1 View  11/06/2014   CLINICAL DATA:  Altered mental status.  EXAM: PORTABLE CHEST - 1 VIEW  COMPARISON:  07/05/2013  FINDINGS: The heart is at the upper limits of normal in size. Pulmonary vasculature is normal. No consolidation, pleural effusion, or pneumothorax. No acute osseous abnormalities are seen. Unchanged scoliotic curvature of the spine.  IMPRESSION: No acute pulmonary process.   Electronically Signed   By: Jeb Levering M.D.   On: 11/06/2014 00:04    Microbiology: Recent Results (from the past 240 hour(s))  MRSA PCR Screening     Status: None   Collection Time: 11/06/14  3:45 PM  Result Value Ref Range Status   MRSA by PCR NEGATIVE NEGATIVE Final    Comment:        The GeneXpert MRSA Assay (FDA approved for NASAL specimens only), is one component of a comprehensive MRSA colonization surveillance program. It is not intended to diagnose MRSA infection nor to guide or monitor treatment for MRSA infections.      Labs: Basic Metabolic Panel:  Recent Labs Lab 11/11/14 0350  11/13/14 0313 11/15/14 1126  NA 143 137 132*  K 3.9 3.8 4.1  CL 116* 112* 106  CO2 18* 19* 21*  GLUCOSE 108* 148* 116*  BUN 26* 22* 27*  CREATININE 1.18* 1.19* 1.28*  CALCIUM 9.9 9.6 9.2   Liver Function Tests: No results for input(s): AST, ALT, ALKPHOS, BILITOT, PROT, ALBUMIN in the last 168 hours. No results for input(s): LIPASE, AMYLASE in the last 168 hours.  Recent Labs Lab 11/11/14  0350  AMMONIA 30   CBC:  Recent Labs Lab 11/11/14 0350 11/13/14 0313 11/15/14 1126  WBC 3.6* 5.7 5.7  HGB 9.7* 10.7* 9.9*  HCT 29.9* 33.0* 30.2*  MCV 79.7 80.1 78.4  PLT 98* 117* 124*   Cardiac Enzymes: No results for input(s): CKTOTAL, CKMB, CKMBINDEX, TROPONINI in the last 168 hours. BNP: BNP (last 3 results) No results for input(s): BNP in the last 8760 hours.  ProBNP (last 3 results) No results for input(s): PROBNP in the last 8760 hours.  CBG:  Recent Labs Lab 11/15/14 0009 11/15/14 0804 11/15/14 1651 11/16/14 0002 11/16/14 0731  GLUCAP 124* 119* 112* 119* 104*       Signed:  Karcyn Menn L  [facility] 11/16/2014, 8:45 AM

## 2014-11-17 ENCOUNTER — Other Ambulatory Visit: Payer: Self-pay | Admitting: *Deleted

## 2014-11-17 ENCOUNTER — Encounter: Payer: Self-pay | Admitting: Radiation Therapy

## 2014-11-17 MED ORDER — LACOSAMIDE 50 MG PO TABS: ORAL_TABLET | ORAL | Status: DC

## 2014-11-17 NOTE — Telephone Encounter (Signed)
[facility] 

## 2014-11-20 ENCOUNTER — Non-Acute Institutional Stay (SKILLED_NURSING_FACILITY): Payer: Medicare Other | Admitting: Internal Medicine

## 2014-11-20 DIAGNOSIS — E785 Hyperlipidemia, unspecified: Secondary | ICD-10-CM

## 2014-11-20 DIAGNOSIS — N390 Urinary tract infection, site not specified: Secondary | ICD-10-CM

## 2014-11-20 DIAGNOSIS — G9340 Encephalopathy, unspecified: Secondary | ICD-10-CM

## 2014-11-20 DIAGNOSIS — F418 Other specified anxiety disorders: Secondary | ICD-10-CM

## 2014-11-20 DIAGNOSIS — Z85841 Personal history of malignant neoplasm of brain: Secondary | ICD-10-CM

## 2014-11-20 DIAGNOSIS — N183 Chronic kidney disease, stage 3 unspecified: Secondary | ICD-10-CM

## 2014-11-20 DIAGNOSIS — G939 Disorder of brain, unspecified: Secondary | ICD-10-CM

## 2014-11-20 DIAGNOSIS — I69959 Hemiplegia and hemiparesis following unspecified cerebrovascular disease affecting unspecified side: Secondary | ICD-10-CM

## 2014-11-20 DIAGNOSIS — R569 Unspecified convulsions: Secondary | ICD-10-CM

## 2014-11-20 DIAGNOSIS — I69359 Hemiplegia and hemiparesis following cerebral infarction affecting unspecified side: Secondary | ICD-10-CM

## 2014-11-21 ENCOUNTER — Other Ambulatory Visit: Payer: Self-pay | Admitting: Radiation Therapy

## 2014-11-21 DIAGNOSIS — C719 Malignant neoplasm of brain, unspecified: Secondary | ICD-10-CM

## 2014-12-05 NOTE — Progress Notes (Signed)
Patient ID: [name], female   DOB: [birth year], 54 y.o.   MRN: [id number]  [facility]     Allergies  Allergen Reactions  . Vasotec Other (See Comments)    Causes angioedema       Chief Complaint  Patient presents with  . Medical Management of Chronic Issues    HPI:  She is a long term resident of this facility being seen for the management of her chronic illnesses. Overall her status remains without change. Her left arm remains spastic; she has been started on baclofen 5 mg three times daily. She is unable to fully participate in the hpi or ros.    Past Medical History  Diagnosis Date  . 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 Fracture, ankle 2012    right  . History of radiation therapy 09/23/10- 11/07/10    right parietal lobe  . Chronic headaches     uses oxycodone appropriately  . Anxiety   . Muscle spasticity   . Seizures   . Dysphagia   . GERD (gastroesophageal reflux disease)     Past Surgical History  Procedure Laterality Date  . Orif ankle fracture  08/22/2010    right  . Radiology with anesthesia N/A 05/02/2013    Procedure: RADIOLOGY WITH ANESTHESIA;  Surgeon: Consuella Lose, MD;  Location: [facility];  Service: Radiology;  Laterality: N/A;  . Brain surgery Right 2012    astrocytoma R parietal lobe    VITAL SIGNS BP 126/74 mmHg  Pulse 70  Ht 5\' 3"  (1.6 m)  Wt 154 lb (69.854 kg)  BMI 27.29 kg/m2  SpO2 97%  LMP 08/26/2010   Outpatient Encounter Prescriptions as of 11/02/2014  Medication Sig   vimpat 50 mg  50 mg twice daily    protonix 40 mg  40 mg daily    neurontin 300 mg  300 mg three times daily    lamictal 100 mg  100 mg daily    neudexta 20/10 Take 20/10 mg twice daily    Baclofen 5 mg  5 gm three times daily   . acetaminophen (TYLENOL) 325 MG tablet Take 650 mg by mouth 3 (three) times daily. For pain  . ARIPiprazole (ABILIFY) 5 MG tablet Take 7 mg by mouth daily. For psychosis  . aspirin 325 MG EC tablet Take 325  mg by mouth daily. For cerebral aneurysm  . atorvastatin (LIPITOR) 10 MG tablet Take 10 mg by mouth daily at 6 PM. For lipoproteins  . FLUoxetine (PROZAC) 20 MG capsule Take 60 mg by mouth daily.  Marland Kitchen levETIRAcetam (KEPPRA) 500 MG tablet Take 1 tablet (500 mg total) by mouth every 12 (twelve) hours.  . Multiple Vitamin (MULTIVITAMIN) tablet Take 1 tablet by mouth daily.        SIGNIFICANT DIAGNOSTIC EXAMS  07-28-14: mri of brain: 1. Stable postoperative changes the right parietal lobe with a persistent hematocrit level in some enhancement along the superior margin of the surgical cavity without significant interval change. The areas of enhancement correspond to the infarct of 2014. There is persistent enhancement along the more inferior areas of the right MCA territory infarct is well. 2. 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 3. Diffuse dural enhancement over the right convexity is stable. 4. Significant signal loss in the right hemisphere is unchanged.   08-04-14: mammogram: No mammographic evidence of malignancy. A result letter of this screening mammogram will be mailed  directly to the patient. RECOMMENDATION: Screening mammogram in one year. (Code:SM-B-01Y)      LABS REVIEWED:  03-14-14: wbc 5.0; hgb 10.6; hct 34.2; mcv 83.5; plt 130; glucose 83; bun 27; creat 1.48; k+4.4; na++145; ca++ 10.6; chol 196; ldl 122; trig 129; hgb a1c 5.0 07-24-14: hgb a1c 5.3  10-09-14: wbc 3.9; hgb 9.6; hct 31.3; mcv 82.7; plt 123; glucose 84; bun 27.2; creat 1.54; k+3.7; na++141; liver normal albumin 3.5    Review of Systems  Unable to perform ROS    Physical Exam Constitutional: She appears well-developed and well-nourished. No distress.   Neck: Neck supple. No JVD present. No thyromegaly present.  Cardiovascular: Normal rate, regular rhythm and intact distal pulses.   Respiratory: Effort normal and breath sounds normal. No  respiratory distress.  GI: Soft. Bowel sounds are normal. She exhibits no distension.  Musculoskeletal: She exhibits no edema.  Left facial drooping  Left side flaccid Left hand in a splint; left arm is spastic     Neurological: She is alert.  Skin: Skin is warm and dry. She is not diaphoretic.      ASSESSMENT/ PLAN:  1. Seizure; no reports of seizure activity present; will continue keppra 500 mg twice daily and vimpat 50 mg twice daily will monitor   2. 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 Psychosis: no reports of behavioral issues present;  on abilify 7 mg daily; is on lamictal 100 mg daily to stabilize mood; does take neudext 20/10 twice daily for PBA is followed by psych services   4. Depression with anxiety: she does continue to receive benefit from prozac 60 mg daily  will not make changes will monitor her status.   5. Dyslipidemia: her ldl is 122; will continue lipitor 10 mg daily   6. Astrocytoma brain tumor: is status post radiation therapy; is without change in status;is followed by neurology  will monitor  7. CKD stage III: no change in status; creat is 1.48; will monitor   8. GERD: will continue protonix 40 mg daily      Ok Edwards NP [facility]  Contact [phone] Monday through Friday 8am- 5pm  After hours call [phone]

## 2014-12-08 ENCOUNTER — Encounter: Payer: Self-pay | Admitting: Radiation Oncology

## 2014-12-08 ENCOUNTER — Ambulatory Visit (HOSPITAL_COMMUNITY)
Admission: RE | Admit: 2014-12-08 | Discharge: 2014-12-08 | Disposition: A | Discharge status: Home / Self Care | Payer: Medicare Other | Source: Ambulatory Visit | Attending: Radiation Oncology | Admitting: Radiation Oncology

## 2014-12-08 DIAGNOSIS — Z923 Personal history of irradiation: Secondary | ICD-10-CM | POA: Insufficient documentation

## 2014-12-08 DIAGNOSIS — C719 Malignant neoplasm of brain, unspecified: Secondary | ICD-10-CM

## 2014-12-08 DIAGNOSIS — I619 Nontraumatic intracerebral hemorrhage, unspecified: Secondary | ICD-10-CM | POA: Insufficient documentation

## 2014-12-08 MED ORDER — GADOBENATE DIMEGLUMINE 529 MG/ML IV SOLN
15.0000 mL | Freq: Once | INTRAVENOUS | Status: AC | PRN
  Administered 2014-12-08: 15 mL via INTRAVENOUS

## 2014-12-08 NOTE — Progress Notes (Signed)
Location/Histology of Brain Tumor: New 11 x 9 x 11 mm rim enhancing lesion within the medial left occipital lobe/HX of astrocytoma  Patient presented with symptoms of:  Worsening mental status; unable to interact with staff at nursing home; more confused  Past or anticipated interventions, if any, per neurosurgery: radiosurgery  Past or anticipated interventions, if any, per medical oncology: concurrent and adjuvant Temodar through May 2013  Dose of Decadron, if applicable: discharged from hospital on 11/05/2014 with decadron taper  Recent neurologic symptoms, if any:   Seizures: no, taking keppra 500 mg bid and vimpat 50 mg bid  Headaches: no  Nausea: no  Dizziness/ataxia: no  Difficulty with hand coordination: no  Focal numbness/weakness: left side spastic; patient bedbound  Visual deficits/changes: no  Confusion/Memory deficits: yes  Painful bone metastases at present, if any: no  SAFETY ISSUES:  Prior radiation? yes  Pacemaker/ICD? no  Possible current pregnancy? no  Is the patient on methotrexate? no  Additional Complaints / other details: 54 year old female. Minimally interactive.

## 2014-12-11 ENCOUNTER — Ambulatory Visit
Admission: RE | Admit: 2014-12-11 | Payer: PRIVATE HEALTH INSURANCE | Source: Ambulatory Visit | Admitting: Radiation Oncology

## 2014-12-11 ENCOUNTER — Ambulatory Visit: Payer: PRIVATE HEALTH INSURANCE | Admitting: Radiation Oncology

## 2014-12-11 ENCOUNTER — Ambulatory Visit: Payer: Medicare Other

## 2014-12-15 ENCOUNTER — Ambulatory Visit: Payer: PRIVATE HEALTH INSURANCE | Admitting: Radiation Oncology

## 2014-12-15 ENCOUNTER — Ambulatory Visit
Admission: RE | Admit: 2014-12-15 | Payer: PRIVATE HEALTH INSURANCE | Source: Ambulatory Visit | Admitting: Radiation Oncology

## 2014-12-15 ENCOUNTER — Encounter: Payer: Self-pay | Admitting: Radiation Oncology

## 2014-12-15 ENCOUNTER — Ambulatory Visit
Admission: RE | Admit: 2014-12-15 | Discharge: 2014-12-15 | Disposition: A | Discharge status: Home / Self Care | Payer: Medicare Other | Source: Ambulatory Visit | Attending: Radiation Oncology | Admitting: Radiation Oncology

## 2014-12-15 VITALS — BP 98/66 | HR 72 | Temp 98.9°F | Resp 16

## 2014-12-15 DIAGNOSIS — H9191 Unspecified hearing loss, right ear: Secondary | ICD-10-CM | POA: Diagnosis not present

## 2014-12-15 DIAGNOSIS — C719 Malignant neoplasm of brain, unspecified: Secondary | ICD-10-CM

## 2014-12-15 DIAGNOSIS — Z8673 Personal history of transient ischemic attack (TIA), and cerebral infarction without residual deficits: Secondary | ICD-10-CM | POA: Diagnosis not present

## 2014-12-15 DIAGNOSIS — H538 Other visual disturbances: Secondary | ICD-10-CM | POA: Diagnosis not present

## 2014-12-15 DIAGNOSIS — Z923 Personal history of irradiation: Secondary | ICD-10-CM | POA: Diagnosis not present

## 2014-12-15 DIAGNOSIS — R110 Nausea: Secondary | ICD-10-CM | POA: Diagnosis not present

## 2014-12-15 DIAGNOSIS — Z79899 Other long term (current) drug therapy: Secondary | ICD-10-CM | POA: Diagnosis not present

## 2014-12-15 DIAGNOSIS — Z7982 Long term (current) use of aspirin: Secondary | ICD-10-CM | POA: Diagnosis not present

## 2014-12-15 DIAGNOSIS — C713 Malignant neoplasm of parietal lobe: Secondary | ICD-10-CM | POA: Diagnosis not present

## 2014-12-15 DIAGNOSIS — Z9221 Personal history of antineoplastic chemotherapy: Secondary | ICD-10-CM | POA: Diagnosis not present

## 2014-12-15 HISTORY — DX: Major depressive disorder, single episode, unspecified: F32.9

## 2014-12-15 HISTORY — DX: Epilepsy, unspecified, not intractable, without status epilepticus: G40.909

## 2014-12-15 HISTORY — DX: Depression, unspecified: F32.A

## 2014-12-15 HISTORY — DX: Unspecified psychosis not due to a substance or known physiological condition: F29

## 2014-12-15 HISTORY — DX: Unspecified mood (affective) disorder: F39

## 2014-12-15 HISTORY — DX: Nontraumatic subarachnoid hemorrhage, unspecified: I60.9

## 2014-12-15 HISTORY — DX: Pure hypercholesterolemia, unspecified: E78.00

## 2014-12-15 HISTORY — DX: Dysphagia, unspecified: R13.10

## 2014-12-15 NOTE — Progress Notes (Signed)
See progress note under physician encounter. 

## 2014-12-15 NOTE — Progress Notes (Addendum)
Radiation Oncology         [phone]   Name: [name]   Date: 12/15/2014   MRN: [id number]  DOB: May 11, 1961    Multidisciplinary Brain and Spine Oncology Clinic Follow-Up Visit Note  CC: [name], DO  Magrinat, Virgie Dad, MD    ICD-9-CM ICD-10-CM   1. 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  The recent films were presented in our multidisciplinary conference with neuroradiology earlier this week.  Patient arrived via medical transport. Patient resting supine on stretcher.  Patient delivered by [facility] for a follow up recon with Dr. [name]. Patient accompanied by her father. Patient alert and oriented to person and place. Responds appropriately to questions but, her responses are delayed. Reports blurry vision in her left eye. Denies diplopia. Reports occasional nausea. Reports occasional headaches. Reports occasional constipation. Father reports the patient's confusion resolved with the UTI. Father reports the patient has physical therapy 3-4 times per week to strengthen her left side. Patient denies ringing in her ears. Patient reports difficulty hearing out of her right ear. Copious amounts of ear wax noted in her right ear                              ALLERGIES:  is allergic to vasotec.  Meds: Current Outpatient Prescriptions  Medication Sig Dispense Refill  . acetaminophen (TYLENOL) 325 MG tablet Take 650 mg by mouth 3 (three) times daily. For pain    . ARIPiprazole (ABILIFY) 5 MG tablet Take 7 mg by mouth daily. For psychosis    . aspirin 325 MG EC tablet Take 325 mg by mouth daily. For cerebral  aneurysm    . atorvastatin (LIPITOR) 10 MG tablet Take 10 mg by mouth daily at 6 PM. For lipoproteins    . baclofen (LIORESAL) 10 MG tablet Take 1 tablet (10 mg total) by mouth 3 (three) times daily as needed for muscle spasms. 30 each 0  . bisacodyl (DULCOLAX) 10 MG suppository Place 1 suppository (10 mg total) rectally daily as needed for moderate constipation. 12 suppository 0  . FLUoxetine (PROZAC) 20 MG capsule Take 60 mg by mouth daily.    Marland Kitchen gabapentin (NEURONTIN) 300 MG capsule Take 1 capsule (300 mg total) by mouth at bedtime.    Marland Kitchen lacosamide (VIMPAT) 50 MG TABS tablet Take one tablet by mouth twice daily 60 tablet 5  . lamoTRIgine (LAMICTAL) 100 MG tablet Take 1 tablet (100 mg total) by mouth daily. For mood disorder    . levETIRAcetam (KEPPRA) 500 MG tablet Take 1 tablet (500 mg total) by mouth every 12 (twelve) hours. 60 tablet 5  . Multiple Vitamin (MULTIVITAMIN) tablet Take 1 tablet by mouth daily.      Marland Kitchen omeprazole (PRILOSEC) 20 MG capsule Take 20 mg by mouth daily.    . polyethylene glycol (MIRALAX / GLYCOLAX) packet Take 17 g by mouth daily. 14 each 0  . dexamethasone (DECADRON) 2 MG tablet 2 mg po bid x 5 days, then 2 mg daily x5 days, then stop (Patient not taking: Reported on  12/15/2014)     No current facility-administered medications for this encounter.    Physical Findings: The patient is in no acute distress. Patient has flat affect and appears chronically ill.   oral temperature is 98.9 F (37.2 C). Her blood pressure is 98/66 and her pulse is 72. Her respiration is 16 and oxygen saturation is 100%. .  Her left upper extremity is MS is 0/5.  Right UE MS is 4/5, and bilat LE's 2-4/5.  Light touch intact in limbs.  Lab Findings: Lab Results  Component Value Date   WBC 5.7 11/15/2014   HGB 9.9* 11/15/2014   HCT 30.2* 11/15/2014   MCV 78.4 11/15/2014   PLT 124* 11/15/2014    @LASTCHEM @  Radiographic Findings: Mr Jeri Cos GM Contrast  12/08/2014   CLINICAL DATA:   Astrocytoma. Postop surgery and radiation. SRS protocol for treatment planning  EXAM: MRI HEAD WITHOUT AND WITH CONTRAST  TECHNIQUE: Multiplanar, multiecho pulse sequences of the brain and surrounding structures were obtained without and with intravenous contrast.  CONTRAST:  84mL MULTIHANCE GADOBENATE DIMEGLUMINE 529 MG/ML IV SOLN  COMPARISON:  MRI 11/06/2014, CT 11/05/2014, MRI 07/28/2014, MRI 01/06/2014  FINDINGS: Postop resection cavity right parietal lobe unchanged in size measuring 3 x 5 cm. There is hemosiderin in the wall of the cavity and a small amount of layering chronic blood products similar to the prior study. There is superficial hemosiderin over the right hemisphere. Chronic hemorrhagic infarction in the right parietal lobe is noted unchanged from the prior study. Mild cortical enhancement of the infarct is present over the convexity. This is unchanged. Extensive white matter hyperintensity in the right parietal lobe is unchanged. This is compatible with surgery and radiation. There is compensatory enlargement of the right lateral ventricle unchanged. Dural thickening and enhancement over the right hemisphere is unchanged and related to prior surgery.  Following contrast infusion, there is a right parietal periventricular nodule measuring 10 mm which is increased slightly in size. This measures 6.6 mm on the most recent study. This area has mild restricted diffusion and may represent subacute infarct versus recurrent tumor.  Left occipital lobe hemorrhagic lesion shows interval increase in size. Postcontrast there is thick walled enhancement of the lesion which now measures 15 mm in diameter compared with 9 x 11 mm previously. There is significantly more enhancement on today's study. Inferior to this lesion is a small amount of hemorrhage in the occipital pole bilaterally. There is increased edema in the left occipital white matter. Note is made of high-density hemorrhage in the left occipital lobe  on the CT of 11/05/2014. This process may be related to radiation change. The enhancement most likely is due to hematoma evolution rather than tumor. Contralateral astrocytoma would be unusual spread pattern.  IMPRESSION: Postop resection of right parietal tumor. Resection cavity is stable. Chronic infarct right parietal lobe stable. Diffuse white matter changes the right hemisphere stable.  Enlarging right parietal periventricular nodule measuring 10 mm. This may represent local recurrence of tumor versus subacute infarction. Continued close follow-up warranted  Left occipital hematoma has increased in size. There is now thick-walled enhancement of the lesion with increased surrounding edema. Additional smaller areas of hemorrhage in the occipital pole bilaterally. This process is felt to be related to radiation change and hemorrhage rather than tumor however continued close followup is warranted.   Electronically Signed   By: Franchot Gallo M.D.   On: 12/08/2014 16:11    Impression:  The patient has a few evolving findings  of uncertain significance.  What was previously described on 11/06/14 as a new 11 mm rim enhancing lesion worrisome for recurrent tumor is now larger at 15 mm and described as a possible evolving hematoma.  In addition she has a right periventricular nodule which is increasing in size.  She is very debilitated by her right MCA infarct, but her AMS on recent admission improved after her UTI was treated.  Plan:  We talked today about the MRI findings and options including radiotherapy, ongoing surveillance, and converting to comfort care without surveillance MRI.  The patient and her father would like MRI and follow-up in 6-8 weeks.  _____________________________________  Sheral Apley [name], M.D.

## 2014-12-15 NOTE — Progress Notes (Signed)
Patient delivered by [facility] for a follow up recon with Dr. [name]. Patient accompanied by her father. Patient alert and oriented to person and place. Responds appropriately to questions but, her responses are delayed. Reports blurry vision in her left eye. Denies diplopia. Reports occasional nausea. Reports occasional headaches. Reports occasional constipation. Father reports the patient's confusion resolved with the UTI. Father reports the patient has physical therapy 3-4 times per week to strengthen her left side. Patient denies ringing in her ears. Patient reports difficulty hearing out of her right ear. Copious amounts of ear wax noted in her right ear.  BP 98/66 mmHg  Pulse 72  Temp(Src) 98.9 F (37.2 C) (Oral)  Resp 16  SpO2 100%  LMP 08/26/2010 Wt Readings from Last 3 Encounters:  11/08/14 158 lb 3.2 oz (71.759 kg)  11/02/14 154 lb (69.854 kg)  10/12/14 154 lb (69.854 kg)

## 2014-12-19 ENCOUNTER — Encounter: Payer: Self-pay | Admitting: Radiation Oncology

## 2014-12-19 ENCOUNTER — Non-Acute Institutional Stay (SKILLED_NURSING_FACILITY): Payer: Medicare Other | Admitting: Adult Health

## 2014-12-19 ENCOUNTER — Telehealth: Payer: Self-pay | Admitting: Radiation Oncology

## 2014-12-19 DIAGNOSIS — R569 Unspecified convulsions: Secondary | ICD-10-CM

## 2014-12-19 DIAGNOSIS — G8110 Spastic hemiplegia affecting unspecified side: Secondary | ICD-10-CM | POA: Diagnosis not present

## 2014-12-19 DIAGNOSIS — I639 Cerebral infarction, unspecified: Secondary | ICD-10-CM

## 2014-12-19 DIAGNOSIS — E785 Hyperlipidemia, unspecified: Secondary | ICD-10-CM

## 2014-12-19 DIAGNOSIS — K2100 Gastro-esophageal reflux disease with esophagitis, without bleeding: Secondary | ICD-10-CM

## 2014-12-19 DIAGNOSIS — G8114 Spastic hemiplegia affecting left nondominant side: Secondary | ICD-10-CM

## 2014-12-19 NOTE — Telephone Encounter (Signed)
During follow up with Dr. [name] on 12/15/2014. Patient reported irritation in her right ear. Copious amount of ear wax noted in right ear as well as her left. Phoned Audley Hose, RN caring for patient at [facility] [place] her of these findings. She verbalized understanding.

## 2014-12-19 NOTE — Addendum Note (Signed)
Encounter addended by: Heywood Footman, RN on: 12/19/2014  9:47 AM<BR>     Documentation filed: Flowsheet VN, Medications

## 2014-12-29 NOTE — Addendum Note (Signed)
Encounter addended by: Doreen Beam, RN on: 12/29/2014  9:07 AM<BR>     Documentation filed: Charges VN

## 2015-01-03 ENCOUNTER — Other Ambulatory Visit: Payer: Self-pay | Admitting: Radiation Therapy

## 2015-01-03 DIAGNOSIS — C719 Malignant neoplasm of brain, unspecified: Secondary | ICD-10-CM

## 2015-01-06 DIAGNOSIS — I69359 Hemiplegia and hemiparesis following cerebral infarction affecting unspecified side: Secondary | ICD-10-CM | POA: Insufficient documentation

## 2015-01-06 DIAGNOSIS — N183 Chronic kidney disease, stage 3 unspecified: Secondary | ICD-10-CM | POA: Insufficient documentation

## 2015-01-06 DIAGNOSIS — Z85841 Personal history of malignant neoplasm of brain: Secondary | ICD-10-CM | POA: Insufficient documentation

## 2015-01-06 DIAGNOSIS — F482 Pseudobulbar affect: Secondary | ICD-10-CM | POA: Insufficient documentation

## 2015-01-16 ENCOUNTER — Non-Acute Institutional Stay (SKILLED_NURSING_FACILITY): Payer: Medicare Other | Admitting: Adult Health

## 2015-01-16 ENCOUNTER — Encounter: Payer: Self-pay | Admitting: Adult Health

## 2015-01-16 DIAGNOSIS — C719 Malignant neoplasm of brain, unspecified: Secondary | ICD-10-CM | POA: Diagnosis not present

## 2015-01-16 DIAGNOSIS — F29 Unspecified psychosis not due to a substance or known physiological condition: Secondary | ICD-10-CM | POA: Diagnosis not present

## 2015-01-16 DIAGNOSIS — I6330 Cerebral infarction due to thrombosis of unspecified cerebral artery: Secondary | ICD-10-CM | POA: Diagnosis not present

## 2015-01-16 DIAGNOSIS — R569 Unspecified convulsions: Secondary | ICD-10-CM | POA: Diagnosis not present

## 2015-01-16 DIAGNOSIS — F418 Other specified anxiety disorders: Secondary | ICD-10-CM | POA: Diagnosis not present

## 2015-01-16 DIAGNOSIS — E785 Hyperlipidemia, unspecified: Secondary | ICD-10-CM | POA: Diagnosis not present

## 2015-01-16 DIAGNOSIS — K2100 Gastro-esophageal reflux disease with esophagitis, without bleeding: Secondary | ICD-10-CM

## 2015-01-16 NOTE — Progress Notes (Signed)
Patient ID: [name], female   DOB: 02/04/1961, 54 y.o.   MRN: [id number]    Facility: [facility]      Allergies  Allergen Reactions  . Vasotec Other (See Comments)    Causes angioedema    Chief Complaint  Patient presents with  . Medical Management of Chronic Issues    HPI:  She is a long term resident of this facility being seen for the management of her chronic illnesses. Overall her status remains stable. She is unable to fully participate in the hpi or ros; but states that she feels good. There are no nursing concerns being voiced today.     Past Medical History  Diagnosis Date  . 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 Fracture, ankle 2012    right  . History of radiation therapy 09/23/10- 11/07/10    right parietal lobe  . Chronic headaches     uses oxycodone appropriately  . Anxiety   . Muscle spasticity   . Seizures   . Dysphagia   . GERD (gastroesophageal reflux disease)   . Nontraumatic subarachnoid hemorrhage   . Pure hypercholesterolemia   . Mild mood disorder   . Atypical psychosis   . Epilepsy   . Problems with swallowing and mastication   . Depression     Past Surgical History  Procedure Laterality Date  . Orif ankle fracture  08/22/2010    right  . Radiology with anesthesia N/A 05/02/2013    Procedure: RADIOLOGY WITH ANESTHESIA;  Surgeon: Consuella Lose, MD;  Location: [facility];  Service: Radiology;  Laterality: N/A;  . Brain surgery Right 2012    astrocytoma R parietal lobe    VITAL SIGNS BP 112/64 mmHg  Pulse 80  Ht 5\' 3"  (1.6 m)  Wt 155 lb (70.308 kg)  BMI 27.46 kg/m2  SpO2 97%  LMP 08/26/2010  Patient's Medications  New Prescriptions   No medications on file  Previous Medications   ACETAMINOPHEN (TYLENOL) 325 MG TABLET    Take 650 mg by mouth 3 (three) times daily. For pain   ARIPIPRAZOLE (ABILIFY) 2 MG TABLET    Take 2 mg by mouth daily.   ARIPIPRAZOLE (ABILIFY) 5 MG TABLET    Take 7 mg by  mouth daily. For psychosis   ASPIRIN 325 MG EC TABLET    Take 325 mg by mouth daily. For cerebral aneurysm   ATORVASTATIN (LIPITOR) 10 MG TABLET    Take 10 mg by mouth daily at 6 PM. For lipoproteins   BACLOFEN (LIORESAL) 10 MG TABLET    Take 1 tablet (10 mg total) by mouth 3 (three) times daily as needed for muscle spasms.   BISACODYL (DULCOLAX) 10 MG SUPPOSITORY    Place 1 suppository (10 mg total) rectally daily as needed for moderate constipation.   FLUOXETINE (PROZAC) 20 MG CAPSULE    Take 60 mg by mouth daily.   GABAPENTIN (NEURONTIN) 300 MG CAPSULE    Take 1 capsule (300 mg total) by mouth at bedtime.   LACOSAMIDE (VIMPAT) 50 MG TABS TABLET    Take one tablet by mouth twice daily   LAMOTRIGINE (LAMICTAL) 100 MG TABLET    Take 1 tablet (100 mg total) by mouth daily. For mood disorder   LEVETIRACETAM (KEPPRA) 500 MG TABLET    Take 1 tablet (500 mg total) by mouth every 12 (twelve) hours.   MAGNESIUM HYDROXIDE (MILK OF MAGNESIA) 400 MG/5ML SUSPENSION    Take by  mouth daily as needed for mild constipation.   MULTIPLE VITAMIN (MULTIVITAMIN) TABLET    Take 1 tablet by mouth daily.     OMEPRAZOLE (PRILOSEC) 20 MG CAPSULE    Take 20 mg by mouth daily.   POLYETHYLENE GLYCOL (MIRALAX / GLYCOLAX) PACKET    Take 17 g by mouth daily.  Modified Medications   No medications on file  Discontinued Medications   No medications on file     SIGNIFICANT DIAGNOSTIC EXAMS  07-28-14: mri of brain: 1. Stable postoperative changes the right parietal lobe with a persistent hematocrit level in some enhancement along the superior margin of the surgical cavity without significant interval change. The areas of enhancement correspond to the infarct of 2014. There is persistent enhancement along the more inferior areas of the right MCA territory infarct is well. 2. 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 3. Diffuse dural enhancement over the right  convexity is stable. 4. Significant signal loss in the right hemisphere is unchanged.   08-04-14: mammogram: No mammographic evidence of malignancy. A result letter of this screening mammogram will be mailed directly to the patient. RECOMMENDATION: Screening mammogram in one year. (Code:SM-B-01Y)      LABS REVIEWED:  03-14-14: wbc 5.0; hgb 10.6; hct 34.2; mcv 83.5; plt 130; glucose 83; bun 27; creat 1.48; k+4.4; na++145; ca++ 10.6; chol 196; ldl 122; trig 129; hgb a1c 5.0 07-24-14: hgb a1c 5.3  10-09-14: wbc 3.9; hgb 9.6; hct 31.3; mcv 82.7; plt 123; glucose 84; bun 27.2; creat 1.54; k+3.7; na++141; liver normal albumin 3.5      Review of Systems  Unable to perform ROS: Other      Physical Exam  Constitutional: She appears well-developed and well-nourished. No distress.  Eyes: Conjunctivae are normal.  Neck: Neck supple. No JVD present. No thyromegaly present.  Cardiovascular: Normal rate, regular rhythm and intact distal pulses.   Respiratory: Effort normal and breath sounds normal. No respiratory distress. She has no wheezes.  GI: Soft. Bowel sounds are normal. She exhibits no distension. There is no tenderness.  Musculoskeletal: She exhibits no edema.  Is able to move right extremities Has left side hemiparesis; flaccid Left arm in splint is spastic Has left side facial drooping   Lymphadenopathy:    She has no cervical adenopathy.  Neurological: She is alert.  Skin: Skin is warm and dry. She is not diaphoretic.  Psychiatric: She has a normal mood and affect.       ASSESSMENT/ PLAN:  1. Seizure; no reports of seizure activity present; will continue keppra 500 mg twice daily and vimpat 50 mg twice daily will monitor   2. 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 Psychosis: no reports of behavioral issues present;  on abilify 7 mg daily; is on lamictal 100 mg daily to stabilize mood;   4. Depression with anxiety: she does continue to receive benefit from prozac 60 mg daily  will not make changes will monitor her status.   5. Dyslipidemia: her ldl is 122; will continue lipitor 10 mg daily   6. Astrocytoma brain tumor: is status post radiation therapy; is without change in status;is followed by neurology  will monitor  7. CKD stage III: no change  in status; creat is 1.48; will monitor   8. GERD: will continue prilosec 20 mg daily       Ok Edwards NP [facility]  Contact [phone] Monday through Friday 8am- 5pm  After hours call [phone]

## 2015-01-22 ENCOUNTER — Encounter: Payer: Self-pay | Admitting: Internal Medicine

## 2015-01-22 ENCOUNTER — Emergency Department (HOSPITAL_COMMUNITY): Payer: Medicare Other

## 2015-01-22 ENCOUNTER — Inpatient Hospital Stay (HOSPITAL_COMMUNITY): Payer: Medicare Other

## 2015-01-22 ENCOUNTER — Non-Acute Institutional Stay (SKILLED_NURSING_FACILITY): Payer: Medicare Other | Admitting: Internal Medicine

## 2015-01-22 ENCOUNTER — Inpatient Hospital Stay (HOSPITAL_COMMUNITY)
Admission: EM | Admit: 2015-01-22 | Discharge: 2015-01-29 | DRG: 054 | Disposition: A | Discharge status: Skilled Nursing Facility | Payer: Medicare Other | Attending: Internal Medicine | Admitting: Internal Medicine

## 2015-01-22 ENCOUNTER — Encounter (HOSPITAL_COMMUNITY): Payer: Self-pay | Admitting: Emergency Medicine

## 2015-01-22 DIAGNOSIS — R627 Adult failure to thrive: Secondary | ICD-10-CM | POA: Diagnosis present

## 2015-01-22 DIAGNOSIS — Z85841 Personal history of malignant neoplasm of brain: Secondary | ICD-10-CM | POA: Diagnosis not present

## 2015-01-22 DIAGNOSIS — D638 Anemia in other chronic diseases classified elsewhere: Secondary | ICD-10-CM | POA: Diagnosis not present

## 2015-01-22 DIAGNOSIS — I6330 Cerebral infarction due to thrombosis of unspecified cerebral artery: Secondary | ICD-10-CM | POA: Insufficient documentation

## 2015-01-22 DIAGNOSIS — G40909 Epilepsy, unspecified, not intractable, without status epilepticus: Secondary | ICD-10-CM | POA: Diagnosis present

## 2015-01-22 DIAGNOSIS — R4182 Altered mental status, unspecified: Secondary | ICD-10-CM | POA: Diagnosis present

## 2015-01-22 DIAGNOSIS — K219 Gastro-esophageal reflux disease without esophagitis: Secondary | ICD-10-CM | POA: Diagnosis present

## 2015-01-22 DIAGNOSIS — Z7982 Long term (current) use of aspirin: Secondary | ICD-10-CM | POA: Diagnosis not present

## 2015-01-22 DIAGNOSIS — I69959 Hemiplegia and hemiparesis following unspecified cerebrovascular disease affecting unspecified side: Secondary | ICD-10-CM

## 2015-01-22 DIAGNOSIS — E43 Unspecified severe protein-calorie malnutrition: Secondary | ICD-10-CM | POA: Diagnosis not present

## 2015-01-22 DIAGNOSIS — C714 Malignant neoplasm of occipital lobe: Principal | ICD-10-CM | POA: Diagnosis present

## 2015-01-22 DIAGNOSIS — R296 Repeated falls: Secondary | ICD-10-CM | POA: Diagnosis not present

## 2015-01-22 DIAGNOSIS — D72819 Decreased white blood cell count, unspecified: Secondary | ICD-10-CM | POA: Diagnosis not present

## 2015-01-22 DIAGNOSIS — N183 Chronic kidney disease, stage 3 (moderate): Secondary | ICD-10-CM | POA: Diagnosis present

## 2015-01-22 DIAGNOSIS — E860 Dehydration: Secondary | ICD-10-CM | POA: Diagnosis not present

## 2015-01-22 DIAGNOSIS — C719 Malignant neoplasm of brain, unspecified: Secondary | ICD-10-CM | POA: Diagnosis present

## 2015-01-22 DIAGNOSIS — R569 Unspecified convulsions: Secondary | ICD-10-CM | POA: Diagnosis not present

## 2015-01-22 DIAGNOSIS — E870 Hyperosmolality and hypernatremia: Secondary | ICD-10-CM | POA: Diagnosis present

## 2015-01-22 DIAGNOSIS — I69359 Hemiplegia and hemiparesis following cerebral infarction affecting unspecified side: Secondary | ICD-10-CM

## 2015-01-22 DIAGNOSIS — E876 Hypokalemia: Secondary | ICD-10-CM

## 2015-01-22 DIAGNOSIS — I69354 Hemiplegia and hemiparesis following cerebral infarction affecting left non-dominant side: Secondary | ICD-10-CM

## 2015-01-22 DIAGNOSIS — E785 Hyperlipidemia, unspecified: Secondary | ICD-10-CM | POA: Diagnosis not present

## 2015-01-22 DIAGNOSIS — Z923 Personal history of irradiation: Secondary | ICD-10-CM | POA: Diagnosis not present

## 2015-01-22 DIAGNOSIS — R900 Intracranial space-occupying lesion found on diagnostic imaging of central nervous system: Secondary | ICD-10-CM

## 2015-01-22 DIAGNOSIS — Z79899 Other long term (current) drug therapy: Secondary | ICD-10-CM | POA: Diagnosis not present

## 2015-01-22 DIAGNOSIS — R1310 Dysphagia, unspecified: Secondary | ICD-10-CM | POA: Diagnosis not present

## 2015-01-22 DIAGNOSIS — G9341 Metabolic encephalopathy: Secondary | ICD-10-CM | POA: Diagnosis present

## 2015-01-22 DIAGNOSIS — Z66 Do not resuscitate: Secondary | ICD-10-CM | POA: Diagnosis not present

## 2015-01-22 DIAGNOSIS — F32A Depression, unspecified: Secondary | ICD-10-CM | POA: Diagnosis present

## 2015-01-22 DIAGNOSIS — G936 Cerebral edema: Secondary | ICD-10-CM | POA: Diagnosis not present

## 2015-01-22 DIAGNOSIS — Z993 Dependence on wheelchair: Secondary | ICD-10-CM

## 2015-01-22 DIAGNOSIS — G9340 Encephalopathy, unspecified: Secondary | ICD-10-CM | POA: Diagnosis present

## 2015-01-22 DIAGNOSIS — Z515 Encounter for palliative care: Secondary | ICD-10-CM | POA: Diagnosis not present

## 2015-01-22 DIAGNOSIS — D696 Thrombocytopenia, unspecified: Secondary | ICD-10-CM | POA: Diagnosis not present

## 2015-01-22 DIAGNOSIS — F29 Unspecified psychosis not due to a substance or known physiological condition: Secondary | ICD-10-CM | POA: Diagnosis not present

## 2015-01-22 DIAGNOSIS — M549 Dorsalgia, unspecified: Secondary | ICD-10-CM

## 2015-01-22 DIAGNOSIS — F329 Major depressive disorder, single episode, unspecified: Secondary | ICD-10-CM | POA: Diagnosis present

## 2015-01-22 LAB — URINALYSIS, ROUTINE W REFLEX MICROSCOPIC
Glucose, UA: NEGATIVE mg/dL
HGB URINE DIPSTICK: NEGATIVE
Ketones, ur: 15 mg/dL — AB
Leukocytes, UA: NEGATIVE
NITRITE: NEGATIVE
PROTEIN: NEGATIVE mg/dL
Specific Gravity, Urine: 1.021 (ref 1.005–1.030)
UROBILINOGEN UA: 1 mg/dL (ref 0.0–1.0)
pH: 6 (ref 5.0–8.0)

## 2015-01-22 LAB — CBC WITH DIFFERENTIAL/PLATELET
BASOS ABS: 0 10*3/uL (ref 0.0–0.1)
BASOS PCT: 0 % (ref 0–1)
Eosinophils Absolute: 0.1 10*3/uL (ref 0.0–0.7)
Eosinophils Relative: 2 % (ref 0–5)
HCT: 33.9 % — ABNORMAL LOW (ref 36.0–46.0)
Hemoglobin: 11 g/dL — ABNORMAL LOW (ref 12.0–15.0)
Lymphocytes Relative: 36 % (ref 12–46)
Lymphs Abs: 2 10*3/uL (ref 0.7–4.0)
MCH: 26.7 pg (ref 26.0–34.0)
MCHC: 32.4 g/dL (ref 30.0–36.0)
MCV: 82.3 fL (ref 78.0–100.0)
MONO ABS: 0.4 10*3/uL (ref 0.1–1.0)
MONOS PCT: 7 % (ref 3–12)
Neutro Abs: 3 10*3/uL (ref 1.7–7.7)
Neutrophils Relative %: 55 % (ref 43–77)
Platelets: 120 10*3/uL — ABNORMAL LOW (ref 150–400)
RBC: 4.12 MIL/uL (ref 3.87–5.11)
RDW: 16.5 % — AB (ref 11.5–15.5)
WBC: 5.5 10*3/uL (ref 4.0–10.5)

## 2015-01-22 LAB — CBC
HEMATOCRIT: 33 % — AB (ref 36.0–46.0)
HEMOGLOBIN: 10.5 g/dL — AB (ref 12.0–15.0)
MCH: 26.5 pg (ref 26.0–34.0)
MCHC: 31.8 g/dL (ref 30.0–36.0)
MCV: 83.3 fL (ref 78.0–100.0)
Platelets: 104 10*3/uL — ABNORMAL LOW (ref 150–400)
RBC: 3.96 MIL/uL (ref 3.87–5.11)
RDW: 16.3 % — ABNORMAL HIGH (ref 11.5–15.5)
WBC: 3.7 10*3/uL — ABNORMAL LOW (ref 4.0–10.5)

## 2015-01-22 LAB — COMPREHENSIVE METABOLIC PANEL
ALBUMIN: 3.6 g/dL (ref 3.5–5.0)
ALT: 27 U/L (ref 14–54)
AST: 42 U/L — AB (ref 15–41)
Alkaline Phosphatase: 77 U/L (ref 38–126)
Anion gap: 12 (ref 5–15)
BUN: 19 mg/dL (ref 6–20)
CO2: 21 mmol/L — AB (ref 22–32)
Calcium: 10.5 mg/dL — ABNORMAL HIGH (ref 8.9–10.3)
Chloride: 119 mmol/L — ABNORMAL HIGH (ref 101–111)
Creatinine, Ser: 1.3 mg/dL — ABNORMAL HIGH (ref 0.44–1.00)
GFR calc Af Amer: 52 mL/min — ABNORMAL LOW (ref >60)
GFR calc non Af Amer: 45 mL/min — ABNORMAL LOW (ref >60)
GLUCOSE: 98 mg/dL (ref 65–99)
POTASSIUM: 3.4 mmol/L — AB (ref 3.5–5.1)
SODIUM: 152 mmol/L — AB (ref 135–145)
TOTAL PROTEIN: 6.3 g/dL — AB (ref 6.5–8.1)
Total Bilirubin: 0.8 mg/dL (ref 0.3–1.2)

## 2015-01-22 LAB — TROPONIN I: Troponin I: <0.03 ng/mL (ref <0.031)

## 2015-01-22 LAB — I-STAT CG4 LACTIC ACID, ED: Lactic Acid, Venous: 0.91 mmol/L (ref 0.5–2.0)

## 2015-01-22 MED ORDER — SODIUM CHLORIDE 0.9 % IJ SOLN
3.0000 mL | Freq: Two times a day (BID) | INTRAMUSCULAR | Status: DC
  Administered 2015-01-23 – 2015-01-29 (×8): 3 mL via INTRAVENOUS

## 2015-01-22 MED ORDER — ARIPIPRAZOLE 10 MG PO TABS: 5.0000 mg | ORAL_TABLET | Freq: Every day | ORAL | Status: DC

## 2015-01-22 MED ORDER — LAMOTRIGINE 100 MG PO TABS
100.0000 mg | ORAL_TABLET | Freq: Every day | ORAL | Status: DC
  Administered 2015-01-24 – 2015-01-29 (×6): 100 mg via ORAL
  Filled 2015-01-22 (×6): qty 1

## 2015-01-22 MED ORDER — DEXAMETHASONE SODIUM PHOSPHATE 4 MG/ML IJ SOLN
4.0000 mg | Freq: Four times a day (QID) | INTRAMUSCULAR | Status: DC
  Administered 2015-01-22 – 2015-01-27 (×19): 4 mg via INTRAVENOUS
  Filled 2015-01-22 (×19): qty 1

## 2015-01-22 MED ORDER — LEVETIRACETAM 500 MG PO TABS: 500.0000 mg | ORAL_TABLET | Freq: Two times a day (BID) | ORAL | Status: DC

## 2015-01-22 MED ORDER — LACOSAMIDE 50 MG PO TABS: 50.0000 mg | ORAL_TABLET | Freq: Two times a day (BID) | ORAL | Status: DC

## 2015-01-22 MED ORDER — FLUOXETINE HCL 20 MG PO CAPS
60.0000 mg | ORAL_CAPSULE | Freq: Every day | ORAL | Status: DC
  Administered 2015-01-24 – 2015-01-29 (×6): 60 mg via ORAL
  Filled 2015-01-22 (×6): qty 3

## 2015-01-22 MED ORDER — SODIUM CHLORIDE 0.9 % IV BOLUS (SEPSIS)
500.0000 mL | Freq: Once | INTRAVENOUS | Status: AC
  Administered 2015-01-22: 500 mL via INTRAVENOUS

## 2015-01-22 MED ORDER — ARIPIPRAZOLE 5 MG PO TABS
7.0000 mg | ORAL_TABLET | Freq: Every day | ORAL | Status: DC
  Administered 2015-01-24 – 2015-01-29 (×6): 7 mg via ORAL
  Filled 2015-01-22 (×7): qty 1

## 2015-01-22 MED ORDER — ARIPIPRAZOLE 2 MG PO TABS: 2.0000 mg | ORAL_TABLET | Freq: Every day | ORAL | Status: DC

## 2015-01-22 MED ORDER — BACLOFEN 10 MG PO TABS
10.0000 mg | ORAL_TABLET | Freq: Two times a day (BID) | ORAL | Status: DC
  Administered 2015-01-24 – 2015-01-29 (×11): 10 mg via ORAL
  Filled 2015-01-22 (×11): qty 1

## 2015-01-22 MED ORDER — HEPARIN SODIUM (PORCINE) 5000 UNIT/ML IJ SOLN
5000.0000 [IU] | Freq: Three times a day (TID) | INTRAMUSCULAR | Status: DC
  Administered 2015-01-22 – 2015-01-29 (×20): 5000 [IU] via SUBCUTANEOUS
  Filled 2015-01-22 (×20): qty 1

## 2015-01-22 MED ORDER — ALBUTEROL SULFATE (2.5 MG/3ML) 0.083% IN NEBU: 2.5000 mg | INHALATION_SOLUTION | RESPIRATORY_TRACT | Status: DC | PRN

## 2015-01-22 MED ORDER — KCL IN DEXTROSE-NACL 20-5-0.45 MEQ/L-%-% IV SOLN
INTRAVENOUS | Status: DC
  Administered 2015-01-22 – 2015-01-26 (×3): via INTRAVENOUS
  Filled 2015-01-22 (×7): qty 1000

## 2015-01-22 MED ORDER — GADOBENATE DIMEGLUMINE 529 MG/ML IV SOLN
14.0000 mL | Freq: Once | INTRAVENOUS | Status: AC | PRN
  Administered 2015-01-22: 14 mL via INTRAVENOUS

## 2015-01-22 NOTE — Progress Notes (Signed)
Patient ID: [name], female   DOB: 1961-01-27, 54 y.o.   MRN: [id number]    DATE: 01/22/15  Location:  [facility]    Place of Service: [id number])   Extended Emergency Contact Information Primary Emergency Contact: Mancha,Tim Address: [address]          [place], [place] [address] [place] Phone: [phone] Mobile Phone: [phone] Relation: Spouse Secondary Emergency Contact: Kimberlee [place] Mobile Phone: [phone] Relation: Father  Advanced Directive information  FULL CODE  Chief Complaint  Patient presents with  . Acute Visit    lethargy    HPI:  54 yo female long term resident seen today for change in mental status. She had a fall on 8/18th with subsequent head trauma but no LOC. Neuro checks revealed change in MS progressively worsened over the last few days. Labs on 8/19 revealed K 2.9 and Na 147; H/H 10.1/33 with WBC 4.4 and Plts 123K. Repeat labs on 8/21 revealed K 2.8 and Na 148 with Cr 1.2, albumin 3.5 and nml liver enzymes/bilirubin. Potassium was ordered on 8/19 and 8/21 but because of her dysphagia and reduced po intake/refusal to take meds, some doses were not able to be given. She now has worsened MS changes with increased lethargy, slurred speech, and moaning. 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 She also has a past hx SAH and seizures. No sz activity has been noted. She does see neurology and has f/u appt Sept 12th   She is a poor historian due to [place] change. Hx obtained from nursing and chart  Past Medical History  Diagnosis Date  . 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 Fracture, ankle 2012    right  . History of radiation therapy 09/23/10- 11/07/10    right parietal lobe  . Chronic headaches     uses oxycodone appropriately  . Anxiety   . Muscle spasticity   . Seizures   .  Dysphagia   . GERD (gastroesophageal reflux disease)   . Nontraumatic subarachnoid hemorrhage   . Pure hypercholesterolemia   . Mild mood disorder   . Atypical psychosis   . Epilepsy   . Problems with swallowing and mastication   . Depression     Past Surgical History  Procedure Laterality Date  . Orif ankle fracture  08/22/2010    right  . Radiology with anesthesia N/A 05/02/2013    Procedure: RADIOLOGY WITH ANESTHESIA;  Surgeon: Consuella Lose, MD;  Location: [place];  Service: Radiology;  Laterality: N/A;  . Brain surgery Right 2012    astrocytoma R parietal lobe    Patient Care Team: [name], DO as PCP - General (Internal Medicine) Gerlene Fee, NP as Nurse Practitioner (Nurse Practitioner)  Social History   Social History  . Marital Status: Married    Spouse Name: N/A  . Number of Children: 1  . Years of Education: N/A   Occupational History  .      unemployed   Social History Main Topics  . Smoking status: Never Smoker   . Smokeless tobacco: Never Used  . Alcohol Use: 0.0 oz/week    3-4 Cans of beer per week  . Drug Use: No  . Sexual Activity: No   Other Topics Concern  . Not on file   Social  History Narrative     reports that she has never smoked. She has never used smokeless tobacco. She reports that she drinks alcohol. She reports that she does not use illicit drugs.  Immunization History  Administered Date(s) Administered  . Influenza,inj,Quad PF,36+ Mos 05/26/2013  . Pneumococcal Polysaccharide-23 05/26/2013  . Tdap 08/30/2011    Allergies  Allergen Reactions  . Vasotec Other (See Comments)    Causes angioedema    Medications: Patient's Medications  New Prescriptions   No medications on file  Previous Medications   ACETAMINOPHEN (TYLENOL) 325 MG TABLET    Take 650 mg by mouth 3 (three) times daily. For pain (6am, 2pm, 10pm)   ARIPIPRAZOLE (ABILIFY) 2 MG TABLET    Take 2 mg by mouth daily. Take with a 5 mg tablet for a 7 mg  dose every morning at 9am   ARIPIPRAZOLE (ABILIFY) 5 MG TABLET    Take 5 mg by mouth daily. For psychosis: Take with a 2 mg tablet for a 7 mg dose every morning at 9am   ASPIRIN 325 MG EC TABLET    Take 325 mg by mouth at bedtime. For cerebral aneurysm   ATORVASTATIN (LIPITOR) 10 MG TABLET    Take 10 mg by mouth at bedtime. For lipoproteins   BACLOFEN (LIORESAL) 10 MG TABLET    Take 1 tablet (10 mg total) by mouth 3 (three) times daily as needed for muscle spasms.   BISACODYL (DULCOLAX) 10 MG SUPPOSITORY    Place 1 suppository (10 mg total) rectally daily as needed for moderate constipation.   FLUOXETINE (PROZAC) 20 MG CAPSULE    Take 60 mg by mouth daily.   GABAPENTIN (NEURONTIN) 300 MG CAPSULE    Take 1 capsule (300 mg total) by mouth at bedtime.   LACOSAMIDE (VIMPAT) 50 MG TABS TABLET    Take one tablet by mouth twice daily   LAMOTRIGINE (LAMICTAL) 100 MG TABLET    Take 1 tablet (100 mg total) by mouth daily. For mood disorder   LEVETIRACETAM (KEPPRA) 500 MG TABLET    Take 1 tablet (500 mg total) by mouth every 12 (twelve) hours.   MULTIPLE VITAMIN (MULTIVITAMIN WITH MINERALS) TABS TABLET    Take 1 tablet by mouth daily.   OMEPRAZOLE (PRILOSEC) 20 MG CAPSULE    Take 20 mg by mouth daily at 6 (six) AM.    POLYETHYLENE GLYCOL (MIRALAX / GLYCOLAX) PACKET    Take 17 g by mouth daily.   POTASSIUM CHLORIDE SA (K-DUR,KLOR-CON) 20 MEQ TABLET    Take 20 mEq by mouth daily.   PRESCRIPTION MEDICATION    Take 60 mLs by mouth 2 (two) times daily. 2 cal supplement (8am and 5pm)  Modified Medications   No medications on file  Discontinued Medications   No medications on file    Review of Systems  Unable to perform ROS: Acuity of condition    Filed Vitals:   01/22/15 2032  BP: 130/68  Pulse: 69  Temp: 98 F (36.7 C)  SpO2: 98%   There is no weight on file to calculate BMI.  Physical Exam  Constitutional: She appears lethargic. She is uncooperative. She has a sickly appearance. No distress.    Frail appearing, moaning and c/o leg cramp  HENT:  Mouth/Throat: Mucous membranes are dry. No oropharyngeal exudate.  Eyes: Pupils are equal, round, and reactive to light. No scleral icterus.  Neck: Neck supple. Carotid bruit is not present. No tracheal deviation present. No thyromegaly present.  Cardiovascular:  Normal rate, regular rhythm and intact distal pulses.  Exam reveals no gallop and no friction rub.   Murmur heard.  Systolic murmur is present with a grade of 1/6  No LE edema b/l. no calf TTP.   Pulmonary/Chest: Effort normal and breath sounds normal. No stridor. No respiratory distress. She has no wheezes. She has no rales.  Poor inspiratory effort as she is not really following commands  Abdominal: Soft. Bowel sounds are normal. She exhibits distension. She exhibits no mass. There is no hepatomegaly. There is no tenderness. There is no rebound and no guarding.  Musculoskeletal:  Left calf muscle hypertrophy with ropy tissue texture changes. No nodularity. Cx of LLE present. She does not move limbs spontaneously  Lymphadenopathy:    She has no cervical adenopathy.  Neurological: She appears lethargic. She displays atrophy. She displays no seizure activity.  Blank stare and unfocused. Left hemiparesis  Skin: Skin is warm and dry. No rash noted.  Psychiatric: She has a normal mood and affect. Her speech is slurred. She is not combative.     Labs reviewed: Admission on 01/22/2015  Component Date Value Ref Range Status  . WBC 01/22/2015 5.5  4.0 - 10.5 K/uL Final  . RBC 01/22/2015 4.12  3.87 - 5.11 MIL/uL Final  . Hemoglobin 01/22/2015 11.0* 12.0 - 15.0 g/dL Final  . HCT 01/22/2015 33.9* 36.0 - 46.0 % Final  . MCV 01/22/2015 82.3  78.0 - 100.0 fL Final  . MCH 01/22/2015 26.7  26.0 - 34.0 pg Final  . MCHC 01/22/2015 32.4  30.0 - 36.0 g/dL Final  . RDW 01/22/2015 16.5* 11.5 - 15.5 % Final  . Platelets 01/22/2015 120* 150 - 400 K/uL Final  . Neutrophils Relative % 01/22/2015  55  43 - 77 % Final  . Neutro Abs 01/22/2015 3.0  1.7 - 7.7 K/uL Final  . Lymphocytes Relative 01/22/2015 36  12 - 46 % Final  . Lymphs Abs 01/22/2015 2.0  0.7 - 4.0 K/uL Final  . Monocytes Relative 01/22/2015 7  3 - 12 % Final  . Monocytes Absolute 01/22/2015 0.4  0.1 - 1.0 K/uL Final  . Eosinophils Relative 01/22/2015 2  0 - 5 % Final  . Eosinophils Absolute 01/22/2015 0.1  0.0 - 0.7 K/uL Final  . Basophils Relative 01/22/2015 0  0 - 1 % Final  . Basophils Absolute 01/22/2015 0.0  0.0 - 0.1 K/uL Final  . Sodium 01/22/2015 152* 135 - 145 mmol/L Final  . Potassium 01/22/2015 3.4* 3.5 - 5.1 mmol/L Final  . Chloride 01/22/2015 119* 101 - 111 mmol/L Final  . CO2 01/22/2015 21* 22 - 32 mmol/L Final  . Glucose, Bld 01/22/2015 98  65 - 99 mg/dL Final  . BUN 01/22/2015 19  6 - 20 mg/dL Final  . Creatinine, Ser 01/22/2015 1.30* 0.44 - 1.00 mg/dL Final  . Calcium 01/22/2015 10.5* 8.9 - 10.3 mg/dL Final  . Total Protein 01/22/2015 6.3* 6.5 - 8.1 g/dL Final  . Albumin 01/22/2015 3.6  3.5 - 5.0 g/dL Final  . AST 01/22/2015 42* 15 - 41 U/L Final  . ALT 01/22/2015 27  14 - 54 U/L Final  . Alkaline Phosphatase 01/22/2015 77  38 - 126 U/L Final  . Total Bilirubin 01/22/2015 0.8  0.3 - 1.2 mg/dL Final  . GFR calc non Af Amer 01/22/2015 45* >60 mL/min Final  . GFR calc Af Amer 01/22/2015 52* >60 mL/min Final   Comment: (NOTE) The eGFR has been calculated using the  CKD EPI equation. This calculation has not been validated in all clinical situations. eGFR's persistently <60 mL/min signify possible Chronic Kidney Disease.   . Anion gap 01/22/2015 12  5 - 15 Final  . Troponin I 01/22/2015 <0.03  <0.031 ng/mL Final   Comment:        NO INDICATION OF MYOCARDIAL INJURY.   . Lactic Acid, Venous 01/22/2015 0.91  0.5 - 2.0 mmol/L Final  . Color, Urine 01/22/2015 AMBER* YELLOW Final   BIOCHEMICALS MAY BE AFFECTED BY COLOR  . APPearance 01/22/2015 CLEAR  CLEAR Final  . Specific Gravity, Urine 01/22/2015  1.021  1.005 - 1.030 Final  . pH 01/22/2015 6.0  5.0 - 8.0 Final  . Glucose, UA 01/22/2015 NEGATIVE  NEGATIVE mg/dL Final  . Hgb urine dipstick 01/22/2015 NEGATIVE  NEGATIVE Final  . Bilirubin Urine 01/22/2015 SMALL* NEGATIVE Final  . Ketones, ur 01/22/2015 15* NEGATIVE mg/dL Final  . Protein, ur 01/22/2015 NEGATIVE  NEGATIVE mg/dL Final  . Urobilinogen, UA 01/22/2015 1.0  0.0 - 1.0 mg/dL Final  . Nitrite 01/22/2015 NEGATIVE  NEGATIVE Final  . Leukocytes, UA 01/22/2015 NEGATIVE  NEGATIVE Final   MICROSCOPIC NOT DONE ON URINES WITH NEGATIVE PROTEIN, BLOOD, LEUKOCYTES, NITRITE, OR GLUCOSE <1000 mg/dL.  Admission on 11/05/2014, Discharged on 11/16/2014  No results displayed because visit has over 200 results.      Dg Chest 1 View  01/22/2015   CLINICAL DATA:  Mental status changes  EXAM: CHEST  1 VIEW  COMPARISON:  11/05/2014  FINDINGS: Cardiomegaly again noted. Chronic elevation of the right hemidiaphragm. No acute infiltrate or pleural effusion. No pulmonary edema.  IMPRESSION: No active disease.  Chronic elevation of the right hemidiaphragm.   Electronically Signed   By: Lahoma Crocker M.D.   On: 01/22/2015 15:50   Ct Head Wo Contrast  01/22/2015   CLINICAL DATA:  54 year old female with increased lethargy and abnormal potassium level. Initial encounter.  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  EXAM: CT HEAD WITHOUT CONTRAST  TECHNIQUE: Contiguous axial images were obtained from the base of the skull through the vertex without intravenous contrast.  COMPARISON:  Post treatment brain MRI 12/08/2014 and earlier.  FINDINGS: Sequelae of right posterior convexity craniotomy. No acute osseous abnormality identified. Visualized paranasal sinuses and mastoids are clear. No acute orbit or scalp soft tissue findings.  Right ICA  siphon vascular stent appears stable in configuration. Right parietal resection cavity measuring up to 5 cm is stable since the July MRI. Hypodensity in the surrounding right hemisphere white matter corresponds to T2 hyperintensity on that study. Posterior right temporal lobe encephalomalacia a re- demonstrated. Stable ventricle size and configuration. Heterogeneity in the left occipital lobe at the site of intra-axial hemorrhage shows regression of CT hyperdensity since June. However, there is increased surrounding hypodensity suggesting increased occipital lobe edema (series 201, images 14 and 15 today versus series 2, images 16 and 17 in June). No superimposed acute intracranial hemorrhage identified. Small volume right hemisphere extra-axial collection appears stable. Basilar cisterns remain patent.  IMPRESSION: 1. Chronic and post treatment changes to the brain. Increased hypodensity in the left occipital lobe since June suggesting new or progressed cerebral edema at that site. 2. No acute intracranial hemorrhage or new cortically based infarct identified.   Electronically Signed   By: Genevie Ann M.D.   On:  01/22/2015 15:55     Assessment/Plan   ICD-9-CM ICD-10-CM   1. Altered mental status, unspecified altered mental status type 780.97 R41.82   2. Hypokalemia 276.8 E87.6   3. Hypernatremia 276.0 E87.0   4. Dysphagia 787.20 R13.10   5. Frequent falls V15.88 R29.6   6. History of hemorrhagic stroke with residual hemiparesis 438.20 I69.959   7. History of astrocytoma V10.85 Z85.841   8. Seizures 780.39 R56.9   9. Psychosis, unspecified psychosis type - stable 298.9 F29     --send to ED for further eval and tx due to acute change in MS with recent fall and head trauma, abnormal labs  --will follow upon readmission  Gabriana Wilmott S. Perlie Gold  [facility] [address] [place], [place] [address] [phone] Cell (Monday-Friday 8 AM - 5  PM) [phone] After 5 PM and follow prompts

## 2015-01-22 NOTE — ED Provider Notes (Addendum)
CSN: [id number]     Arrival date & time 01/22/15  1453 History   First MD Initiated Contact with Patient 01/22/15 1502     Chief Complaint  Patient presents with  . Fatigue  . Abnormal Lab     (Consider location/radiation/quality/duration/timing/severity/associated sxs/prior Treatment) HPI Comments: Patient presents to the emergency department for evaluation of weakness and mental status changes. Symptoms have been ongoing for at least 1 day. Patient has difficulty answering questions upon arrival, baseline mental status is unclear at this time. Patient not oriented, answers "yeah" to most questions. Level V Caveat due to mental status change.    Past Medical History  Diagnosis Date  . 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 Fracture, ankle 2012    right  . History of radiation therapy 09/23/10- 11/07/10    right parietal lobe  . Chronic headaches     uses oxycodone appropriately  . Anxiety   . Muscle spasticity   . Seizures   . Dysphagia   . GERD (gastroesophageal reflux disease)   . Nontraumatic subarachnoid hemorrhage   . Pure hypercholesterolemia   . Mild mood disorder   . Atypical psychosis   . Epilepsy   . Problems with swallowing and mastication   . Depression    Past Surgical History  Procedure Laterality Date  . Orif ankle fracture  08/22/2010    right  . Radiology with anesthesia N/A 05/02/2013    Procedure: RADIOLOGY WITH ANESTHESIA;  Surgeon: Consuella Lose, MD;  Location: [facility];  Service: Radiology;  Laterality: N/A;  . Brain surgery Right 2012    astrocytoma R parietal lobe   Family History  Problem Relation Age of Onset  . Breast cancer Mother    Social History  Substance Use Topics  . Smoking status: Never Smoker   . Smokeless tobacco: Never Used  . Alcohol Use: 0.0 oz/week    3-4 Cans of beer per week   OB History    No data available     Review of Systems  Unable to perform ROS: Mental status change      Allergies   Vasotec  Home Medications   Prior to Admission medications   Medication Sig Start Date End Date Taking? Authorizing Provider  acetaminophen (TYLENOL) 325 MG tablet Take 650 mg by mouth 3 (three) times daily. For pain (6am, 2pm, 10pm)   Yes Historical Provider, MD  ARIPiprazole (ABILIFY) 2 MG tablet Take 2 mg by mouth daily. Take with a 5 mg tablet for a 7 mg dose every morning at 9am   Yes Historical Provider, MD  ARIPiprazole (ABILIFY) 5 MG tablet Take 5 mg by mouth daily. For psychosis: Take with a 2 mg tablet for a 7 mg dose every morning at 9am   Yes Historical Provider, MD  aspirin 325 MG EC tablet Take 325 mg by mouth at bedtime. For cerebral aneurysm   Yes Historical Provider, MD  atorvastatin (LIPITOR) 10 MG tablet Take 10 mg by mouth at bedtime. For lipoproteins   Yes Historical Provider, MD  baclofen (LIORESAL) 10 MG tablet Take 1 tablet (10 mg total) by mouth 3 (three) times daily as needed for muscle spasms. Patient taking differently: Take 10 mg by mouth 2 (two) times daily.  11/16/14  Yes Delfina Redwood, MD  bisacodyl (DULCOLAX) 10 MG suppository Place 1 suppository (10 mg total) rectally daily as needed for moderate constipation. 11/16/14  Yes Delfina Redwood, MD  FLUoxetine (PROZAC) 20  MG capsule Take 60 mg by mouth daily. 07/12/13  Yes Adeline Saralyn Pilar, MD  gabapentin (NEURONTIN) 300 MG capsule Take 1 capsule (300 mg total) by mouth at bedtime. 11/16/14  Yes Delfina Redwood, MD  lacosamide (VIMPAT) 50 MG TABS tablet Take one tablet by mouth twice daily Patient taking differently: Take 50 mg by mouth 2 (two) times daily.  11/17/14  Yes Tiffany L Reed, DO  lamoTRIgine (LAMICTAL) 100 MG tablet Take 1 tablet (100 mg total) by mouth daily. For mood disorder 11/16/14  Yes Delfina Redwood, MD  levETIRAcetam (KEPPRA) 500 MG tablet Take 1 tablet (500 mg total) by mouth every 12 (twelve) hours. 01/25/13  Yes Chauncey Cruel, MD  Multiple Vitamin (MULTIVITAMIN WITH MINERALS) TABS  tablet Take 1 tablet by mouth daily.   Yes Historical Provider, MD  omeprazole (PRILOSEC) 20 MG capsule Take 20 mg by mouth daily at 6 (six) AM.    Yes Historical Provider, MD  polyethylene glycol (MIRALAX / GLYCOLAX) packet Take 17 g by mouth daily. 11/16/14  Yes Delfina Redwood, MD  potassium chloride SA (K-DUR,KLOR-CON) 20 MEQ tablet Take 20 mEq by mouth daily.   Yes Historical Provider, MD  PRESCRIPTION MEDICATION Take 60 mLs by mouth 2 (two) times daily. 2 cal supplement (8am and 5pm)   Yes Historical Provider, MD   BP 151/78 mmHg  Pulse 83  Temp(Src) 98.1 F (36.7 C) (Rectal)  Resp 19  SpO2 79%  LMP 08/26/2010 Physical Exam  Constitutional: She appears well-developed and well-nourished. She appears listless. No distress.  HENT:  Head: Normocephalic and atraumatic.  Right Ear: Hearing normal.  Left Ear: Hearing normal.  Nose: Nose normal.  Mouth/Throat: Oropharynx is clear and moist. Mucous membranes are dry.  Eyes: Conjunctivae and EOM are normal. Pupils are equal, round, and reactive to light.  Neck: Normal range of motion. Neck supple.  Cardiovascular: Regular rhythm, S1 normal and S2 normal.  Exam reveals no gallop and no friction rub.   No murmur heard. Pulmonary/Chest: Effort normal and breath sounds normal. No respiratory distress. She exhibits no tenderness.  Abdominal: Soft. Normal appearance and bowel sounds are normal. There is no hepatosplenomegaly. There is no tenderness. There is no rebound, no guarding, no tenderness at McBurney's point and negative Murphy's sign. No hernia.  Musculoskeletal: Normal range of motion.  Neurological: She has normal strength. She appears listless. She is disoriented. No cranial nerve deficit or sensory deficit. Coordination normal. GCS eye subscore is 4. GCS verbal subscore is 4. GCS motor subscore is 6.  Patient with eyes closed, answers to voice. Disoriented. Answer some questions appropriately, intermittently follows commands.   Skin: Skin is warm, dry and intact. No rash noted. No cyanosis.  Psychiatric: Her speech is delayed.  Nursing note and vitals reviewed.   ED Course  Procedures (including critical care time) Labs Review Labs Reviewed  CBC WITH DIFFERENTIAL/PLATELET - Abnormal; Notable for the following:    Hemoglobin 11.0 (*)    HCT 33.9 (*)    RDW 16.5 (*)    Platelets 120 (*)    All other components within normal limits  COMPREHENSIVE METABOLIC PANEL - Abnormal; Notable for the following:    Sodium 152 (*)    Potassium 3.4 (*)    Chloride 119 (*)    CO2 21 (*)    Creatinine, Ser 1.30 (*)    Calcium 10.5 (*)    Total Protein 6.3 (*)    AST 42 (*)    GFR  calc non Af Amer 45 (*)    GFR calc Af Amer 52 (*)    All other components within normal limits  URINALYSIS, ROUTINE W REFLEX MICROSCOPIC (NOT AT [facility]) - Abnormal; Notable for the following:    Color, Urine AMBER (*)    Bilirubin Urine SMALL (*)    Ketones, ur 15 (*)    All other components within normal limits  TROPONIN I  I-STAT CG4 LACTIC ACID, ED    Imaging Review Dg Chest 1 View  01/22/2015   CLINICAL DATA:  Mental status changes  EXAM: CHEST  1 VIEW  COMPARISON:  11/05/2014  FINDINGS: Cardiomegaly again noted. Chronic elevation of the right hemidiaphragm. No acute infiltrate or pleural effusion. No pulmonary edema.  IMPRESSION: No active disease.  Chronic elevation of the right hemidiaphragm.   Electronically Signed   By: Lahoma Crocker M.D.   On: 01/22/2015 15:50   Ct Head Wo Contrast  01/22/2015   CLINICAL DATA:  55 year old female with increased lethargy and abnormal potassium level. Initial encounter.  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  EXAM: CT HEAD WITHOUT CONTRAST  TECHNIQUE: Contiguous axial images were obtained from the base of the skull  through the vertex without intravenous contrast.  COMPARISON:  Post treatment brain MRI 12/08/2014 and earlier.  FINDINGS: Sequelae of right posterior convexity craniotomy. No acute osseous abnormality identified. Visualized paranasal sinuses and mastoids are clear. No acute orbit or scalp soft tissue findings.  Right ICA siphon vascular stent appears stable in configuration. Right parietal resection cavity measuring up to 5 cm is stable since the July MRI. Hypodensity in the surrounding right hemisphere white matter corresponds to T2 hyperintensity on that study. Posterior right temporal lobe encephalomalacia a re- demonstrated. Stable ventricle size and configuration. Heterogeneity in the left occipital lobe at the site of intra-axial hemorrhage shows regression of CT hyperdensity since June. However, there is increased surrounding hypodensity suggesting increased occipital lobe edema (series 201, images 14 and 15 today versus series 2, images 16 and 17 in June). No superimposed acute intracranial hemorrhage identified. Small volume right hemisphere extra-axial collection appears stable. Basilar cisterns remain patent.  IMPRESSION: 1. Chronic and post treatment changes to the brain. Increased hypodensity in the left occipital lobe since June suggesting new or progressed cerebral edema at that site. 2. No acute intracranial hemorrhage or new cortically based infarct identified.   Electronically Signed   By: Genevie Ann M.D.   On: 01/22/2015 15:55   I have personally reviewed and evaluated these images and lab results as part of my medical decision-making.   EKG Interpretation None      MDM   Final diagnoses:  Hypernatremia  Acute encephalopathy  Intracranial mass    Patient brought to the emergency department by EMS from nursing home for evaluation of mental status changes. Patient noted to be confused upon arrival, does not answer questions appropriately and is disoriented. This appears to be a change  from her state baseline in the medical record.  Workup reveals significant hypernatremia without significant change in her BUN/creatinine. She does, however, clinically appear to be dehydrated.  Review of records reveals recent hospitalization for mental status changes where she was noted to have a new ring-enhancing lesion in her brain, but this is being watched. Mental status changes at previous hospitalization were felt to be  primarily secondary to UTI and improved with treatment. Urinalysis today, however, does not show any significant sign of infection.   CT scan was performed. She does have increased hypodensity in the left occipital lobe in the area where the lesion was previously seen. This is consistent with progressed cerebral edema. Patient also noted to have intermittent shaking of her right upper extremity. She was awake and attempting to answer questions during this movement, however. He was not clear if this was seizure activity. Dr. Leonel Ramsay, neurology was consulted. He did not feel that this was new seizure activity.  Patient will require hospitalization for further evaluation of acute encephalopathy, repeat MRI to further evaluate the lesion in her brain. Dr. [name], radiation oncology has been following her case. There was also mention of possible stereotactic intervention by Dr. Kathyrn Sheriff during the last hospitalization, but ultimately Dr. [name] felt the lesion could be monitored.  1736: Discussed with Dr. Wendee Beavers, hospitalist service. Dr. Wendee Beavers asks for help managing intracranial lesion.  1809: Discussed with Dr. Saintclair Halsted, on-call for neurosurgery. He recommends Decadron 4 mg every 6 hours. Decadron has been ordered. Confirms that there would be no emergent intervention for the patient tonight, regardless of MRI findings. Patient can be seen in the morning for further treatment recommendations. Patient will be consulted on tomorrow.  1814: Discussed with Dr. Wendee Beavers once again. Patient  to be admitted for treatment of acute encephalopathy, hypernatremia.  Orpah Greek, MD 01/22/15 [place], MD 01/22/15 [place], MD 01/22/15 [phone]

## 2015-01-22 NOTE — ED Notes (Signed)
Pt arrives via EMS from [facility] for increased lethargy and elevated potassium. VSS.

## 2015-01-22 NOTE — Progress Notes (Signed)
Patient arrived to 5M05 AAOx2. Patient moning, tremors in right hand, complaining of a headache. Vitals taken, tele placed, call bell activated. Made NPO until speech eval. Pt transported to MRI. Will continue to monitor closely. Othal Kubitz, Rande Brunt, RN

## 2015-01-22 NOTE — ED Notes (Signed)
Pt returned from radiology.

## 2015-01-22 NOTE — H&P (Signed)
[facility] History and Physical  [name] ZJQ:[id number] DOB: 1960-09-08 DOA: 01/22/2015  Referring physician: Dr. Betsey Holiday PCP: [name], DO   Chief Complaint: Altered mental status  HPI: [name] is a 54 y.o. female  Caucasian female with history of hyperlipidemia, GERD, depression, seizure, left-sided muscle spasm, right parietal astrocytoma status post radiation excision 2012 with a history of subsequent subarachnoid hemorrhage in 2014 and subsequently was found to have subdural hematoma and right MCA infarct during that admission. Has presented on prior admissions with altered mental status secondary to UTI. Presents again this admission secondary to altered mental status which was noticed at facility. Patient reportedly answers yes and no to questions about baseline. Further evaluation in the ED would reveal any CT scan finding suggesting new or progressed cerebral edema, hypernatremia, and mild hypokalemia.  Neurology and neurosurgery were consulted and we were subsequently consulted for medical admission for further evaluation and recommendations   Review of Systems:  Unable to assess secondary to altered mental status  Past Medical History  Diagnosis Date  . 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 Fracture, ankle 2012    right  . History of radiation therapy 09/23/10- 11/07/10    right parietal lobe  . Chronic headaches     uses oxycodone appropriately  . Anxiety   . Muscle spasticity   . Seizures   . Dysphagia   . GERD (gastroesophageal reflux disease)   . Nontraumatic subarachnoid hemorrhage   . Pure hypercholesterolemia   . Mild mood disorder   . Atypical psychosis   . Epilepsy   . Problems with swallowing and mastication   . Depression    Past Surgical History  Procedure Laterality Date  . Orif ankle fracture  08/22/2010    right  . Radiology with anesthesia N/A 05/02/2013    Procedure: RADIOLOGY WITH ANESTHESIA;   Surgeon: Consuella Lose, MD;  Location: [facility];  Service: Radiology;  Laterality: N/A;  . Brain surgery Right 2012    astrocytoma R parietal lobe   Social History:  reports that she has never smoked. She has never used smokeless tobacco. She reports that she drinks alcohol. She reports that she does not use illicit drugs.  Allergies  Allergen Reactions  . Vasotec Other (See Comments)    Causes angioedema    Family History  Problem Relation Age of Onset  . Breast cancer Mother     Prior to Admission medications   Medication Sig Start Date End Date Taking? Authorizing Provider  acetaminophen (TYLENOL) 325 MG tablet Take 650 mg by mouth 3 (three) times daily. For pain (6am, 2pm, 10pm)   Yes Historical Provider, MD  ARIPiprazole (ABILIFY) 2 MG tablet Take 2 mg by mouth daily. Take with a 5 mg tablet for a 7 mg dose every morning at 9am   Yes Historical Provider, MD  ARIPiprazole (ABILIFY) 5 MG tablet Take 5 mg by mouth daily. For psychosis: Take with a 2 mg tablet for a 7 mg dose every morning at 9am   Yes Historical Provider, MD  aspirin 325 MG EC tablet Take 325 mg by mouth at bedtime. For cerebral aneurysm   Yes Historical Provider, MD  atorvastatin (LIPITOR) 10 MG tablet Take 10 mg by mouth at bedtime. For lipoproteins   Yes Historical Provider, MD  baclofen (LIORESAL) 10 MG tablet Take 1 tablet (10 mg total) by mouth 3 (three) times daily as needed for muscle spasms. Patient taking differently: Take  10 mg by mouth 2 (two) times daily.  11/16/14  Yes Delfina Redwood, MD  bisacodyl (DULCOLAX) 10 MG suppository Place 1 suppository (10 mg total) rectally daily as needed for moderate constipation. 11/16/14  Yes Delfina Redwood, MD  FLUoxetine (PROZAC) 20 MG capsule Take 60 mg by mouth daily. 07/12/13  Yes Adeline Saralyn Pilar, MD  gabapentin (NEURONTIN) 300 MG capsule Take 1 capsule (300 mg total) by mouth at bedtime. 11/16/14  Yes Delfina Redwood, MD  lacosamide (VIMPAT) 50 MG TABS  tablet Take one tablet by mouth twice daily Patient taking differently: Take 50 mg by mouth 2 (two) times daily.  11/17/14  Yes Tiffany L Reed, DO  lamoTRIgine (LAMICTAL) 100 MG tablet Take 1 tablet (100 mg total) by mouth daily. For mood disorder 11/16/14  Yes Delfina Redwood, MD  levETIRAcetam (KEPPRA) 500 MG tablet Take 1 tablet (500 mg total) by mouth every 12 (twelve) hours. 01/25/13  Yes Chauncey Cruel, MD  Multiple Vitamin (MULTIVITAMIN WITH MINERALS) TABS tablet Take 1 tablet by mouth daily.   Yes Historical Provider, MD  omeprazole (PRILOSEC) 20 MG capsule Take 20 mg by mouth daily at 6 (six) AM.    Yes Historical Provider, MD  polyethylene glycol (MIRALAX / GLYCOLAX) packet Take 17 g by mouth daily. 11/16/14  Yes Delfina Redwood, MD  potassium chloride SA (K-DUR,KLOR-CON) 20 MEQ tablet Take 20 mEq by mouth daily.   Yes Historical Provider, MD  PRESCRIPTION MEDICATION Take 60 mLs by mouth 2 (two) times daily. 2 cal supplement (8am and 5pm)   Yes Historical Provider, MD   Physical Exam: Filed Vitals:   01/22/15 1745 01/22/15 1815 01/22/15 1830 01/22/15 1845  BP: 151/78 132/71 130/61 121/64  Pulse:  67 63 61  Temp:      TempSrc:      Resp: 19 15 17 14   SpO2: 96% 96% 94% 95%    Wt Readings from Last 3 Encounters:  12/19/14 70.308 kg (155 lb)  11/08/14 71.759 kg (158 lb 3.2 oz)  11/02/14 69.854 kg (154 lb)    General: Patient laying in bed, cases in my general direction but otherwise does not interact with examiner Eyes: PERRL, normal lids, irises & conjunctiva ENT: grossly normal lips & tongue, dry mucous membranes Neck: no LAD, masses or thyromegaly Cardiovascular: RRR, no m/r/g. No LE edema. Respiratory: CTA bilaterally, no w/r/r. Normal respiratory effort. Abdomen: soft, nt, nd Skin: no rash or induration seen on limited exam Musculoskeletal: Patient currently seems contracted Psychiatric: Unable to appropriately assess due to limited cooperation with examiner or  mental status Neurologic: Unable to appropriately assess due to limited cooperation with examiner or mental status          Labs on Admission:  Basic Metabolic Panel:  Recent Labs Lab 01/22/15 1500  NA 152*  K 3.4*  CL 119*  CO2 21*  GLUCOSE 98  BUN 19  CREATININE 1.30*  CALCIUM 10.5*   Liver Function Tests:  Recent Labs Lab 01/22/15 1500  AST 42*  ALT 27  ALKPHOS 77  BILITOT 0.8  PROT 6.3*  ALBUMIN 3.6   No results for input(s): LIPASE, AMYLASE in the last 168 hours. No results for input(s): AMMONIA in the last 168 hours. CBC:  Recent Labs Lab 01/22/15 1500  WBC 5.5  NEUTROABS 3.0  HGB 11.0*  HCT 33.9*  MCV 82.3  PLT 120*   Cardiac Enzymes:  Recent Labs Lab 01/22/15 1500  TROPONINI <0.03  BNP (last 3 results) No results for input(s): BNP in the last 8760 hours.  ProBNP (last 3 results) No results for input(s): PROBNP in the last 8760 hours.  CBG: No results for input(s): GLUCAP in the last 168 hours.  Radiological Exams on Admission: Dg Chest 1 View  01/22/2015   CLINICAL DATA:  Mental status changes  EXAM: CHEST  1 VIEW  COMPARISON:  11/05/2014  FINDINGS: Cardiomegaly again noted. Chronic elevation of the right hemidiaphragm. No acute infiltrate or pleural effusion. No pulmonary edema.  IMPRESSION: No active disease.  Chronic elevation of the right hemidiaphragm.   Electronically Signed   By: Lahoma Crocker M.D.   On: 01/22/2015 15:50   Ct Head Wo Contrast  01/22/2015   CLINICAL DATA:  54 year old female with increased lethargy and abnormal potassium level. Initial encounter.  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  EXAM: CT HEAD WITHOUT CONTRAST  TECHNIQUE: Contiguous axial images were obtained from the base of the skull through the vertex without intravenous  contrast.  COMPARISON:  Post treatment brain MRI 12/08/2014 and earlier.  FINDINGS: Sequelae of right posterior convexity craniotomy. No acute osseous abnormality identified. Visualized paranasal sinuses and mastoids are clear. No acute orbit or scalp soft tissue findings.  Right ICA siphon vascular stent appears stable in configuration. Right parietal resection cavity measuring up to 5 cm is stable since the July MRI. Hypodensity in the surrounding right hemisphere white matter corresponds to T2 hyperintensity on that study. Posterior right temporal lobe encephalomalacia a re- demonstrated. Stable ventricle size and configuration. Heterogeneity in the left occipital lobe at the site of intra-axial hemorrhage shows regression of CT hyperdensity since June. However, there is increased surrounding hypodensity suggesting increased occipital lobe edema (series 201, images 14 and 15 today versus series 2, images 16 and 17 in June). No superimposed acute intracranial hemorrhage identified. Small volume right hemisphere extra-axial collection appears stable. Basilar cisterns remain patent.  IMPRESSION: 1. Chronic and post treatment changes to the brain. Increased hypodensity in the left occipital lobe since June suggesting new or progressed cerebral edema at that site. 2. No acute intracranial hemorrhage or new cortically based infarct identified.   Electronically Signed   By: Genevie Ann M.D.   On: 01/22/2015 15:55    Assessment/Plan Principal Problem:   Metabolic encephalopathy - Etiology may be multifactorial given dehydration as well as new report of hypodensity in the left occipital lobe suggesting new or progressed cerebral edema. Neurosurgeon consulted and plan is for dexamethasone. - We'll administer fluids. If no improvement in mentation to baseline we will expand further workup to include: TSH, vitamin B-12, folate, RPR, HIV - Otherwise obtain speech therapy and place on dysphagia 3 diet - UA results  reviewed currently none consistent with source of infectious etiology - Neurology and neurosurgery consulted and assisting  Active Problems:   Astrocytoma brain tumor - Patient has history of this and based on chart patient is status post radiation and excision in 2012 with history of subsequent subarachnoid hemorrhage 2014  Hypernatremia - Provide gentle rehydration and place on maintenance IV fluids  Hypokalemia -Mild most likely due to poor oral intake. Will add potassium and maintenance IV fluids    Seizures - Continue home medication regimen. Patient unable to take medications orally will have to adjust IV regimen    Depression - continue  home medication regimen.   Code Status: Full DVT Prophylaxis: heparin Family Communication: None at bedside Disposition Plan: Pending improvement in condition and recommendations from specialist.  Time spent: > 60 minutes  Velvet Bathe [facility] Pager [phone]

## 2015-01-22 NOTE — Consult Note (Signed)
Neurology Consultation Reason for Consult: Altered Mental Status Referring Physician: Pollina, C  CC: altered mental status  History is obtained from:chart  HPI: [name] is a 54 y.o. female with a history of SAH, SDH, stroke, astrocytoma who lives at a nursing home. She has been lethargic for the past couple of days and therefore was brought to the ER. She has not had any clear breakthrough seizure, but does have some right sided movements for which neurology has been consulted.    LKW: unclear tpa given?: no, out of window.     ROS: Unable to obtain due to altered mental status.   Past Medical History  Diagnosis Date  . 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 Fracture, ankle 2012    right  . History of radiation therapy 09/23/10- 11/07/10    right parietal lobe  . Chronic headaches     uses oxycodone appropriately  . Anxiety   . Muscle spasticity   . Seizures   . Dysphagia   . GERD (gastroesophageal reflux disease)   . Nontraumatic subarachnoid hemorrhage   . Pure hypercholesterolemia   . Mild mood disorder   . Atypical psychosis   . Epilepsy   . Problems with swallowing and mastication   . Depression      Family History  Problem Relation Age of Onset  . Breast cancer Mother      Social History:  reports that she has never smoked. She has never used smokeless tobacco. She reports that she drinks alcohol. She reports that she does not use illicit drugs.   Exam: Current vital signs: BP 148/94 mmHg  Pulse 92  Temp(Src) 98.1 F (36.7 C) (Rectal)  Resp 20  SpO2 95%  LMP 08/26/2010 Vital signs in last 24 hours: Temp:  [98.1 F (36.7 C)] 98.1 F (36.7 C) (08/22 1503) Pulse Rate:  [71-92] 92 (08/22 1700) Resp:  [14-21] 20 (08/22 1700) BP: (139-148)/(68-94) 148/94 mmHg (08/22 1700) SpO2:  [95 %-98 %] 95 % (08/22 1700)   Physical Exam  Constitutional: Appears well-developed and well-nourished.  Psych: Affect appropriate to  situation Eyes: No scleral injection HENT: No OP obstrucion Head: Normocephalic.  Cardiovascular: Normal rate and regular rhythm.  Respiratory: Effort normal and breath sounds normal to anterior ascultation GI: Soft.  No distension. There is no tenderness.  Skin: WDI  Neuro: Mental Status: Patient is awake, alert, she has some perseveration and answers questions intermittently her responses are slow and labored. Cranial Nerves: II: Does not blink to threat from the left. Pupils are equal, round, and reactive to light.   III,IV, VI: EOMI without ptosis or diploplia.  V: Facial sensation is symmetric to temperature VII: Facial movement is decreased on the left VIII: hearing is intact to voice X: Uvula elevates symmetrically XI: Shoulder shrug is symmetric. XII: tongue is midline without atrophy or fasciculations.  Motor: Tone is increased on the left. She has no movement in her left arm or leg, she follows commands and moves her right arm voluntarily though keeps her fist clenched except when asked to open it. She is able to open it. She does have some abnormal tremor type movements, but these are abortable with repositioning. Sensory: Sensation is decreased throughout the left side Cerebellar: She is unable to perform.   I have reviewed labs in epic and the results pertinent to this consultation are: Hyponatremia.   I have reviewed the images obtained: CT head-mild increase in edema with known  mass  Impression: 55 year old female with altered mental status in the setting of previous strokes, subarachnoid, brain mass which appears to have worsened. My suspicion is that this is a multifactorial encephalopathy with an acute worsening due to ?dehydration which is a possible cause for her hypernatremia. I would favor further evaluation of her mass with MRI brain.  Recommendations: 1) MRI brain 2) EEG 3) IV fluids, tx hypernatremia 4) UA, cxr 5) Will continue to follow.     Roland Rack, MD [facility] [phone]  If 7pm- 7am, please page neurology on call as listed in [place].

## 2015-01-23 ENCOUNTER — Inpatient Hospital Stay (HOSPITAL_COMMUNITY)
Admit: 2015-01-23 | Discharge: 2015-01-23 | Disposition: A | Discharge status: Home / Self Care | Payer: Medicare Other | Attending: Neurology | Admitting: Neurology

## 2015-01-23 ENCOUNTER — Inpatient Hospital Stay (HOSPITAL_COMMUNITY): Payer: Medicare Other

## 2015-01-23 DIAGNOSIS — R569 Unspecified convulsions: Secondary | ICD-10-CM

## 2015-01-23 DIAGNOSIS — G9340 Encephalopathy, unspecified: Secondary | ICD-10-CM

## 2015-01-23 LAB — CBC
HCT: 31.9 % — ABNORMAL LOW (ref 36.0–46.0)
HEMOGLOBIN: 10 g/dL — AB (ref 12.0–15.0)
MCH: 26.2 pg (ref 26.0–34.0)
MCHC: 31.3 g/dL (ref 30.0–36.0)
MCV: 83.5 fL (ref 78.0–100.0)
Platelets: 117 10*3/uL — ABNORMAL LOW (ref 150–400)
RBC: 3.82 MIL/uL — ABNORMAL LOW (ref 3.87–5.11)
RDW: 16.3 % — ABNORMAL HIGH (ref 11.5–15.5)
WBC: 3 10*3/uL — ABNORMAL LOW (ref 4.0–10.5)

## 2015-01-23 LAB — PHOSPHORUS: PHOSPHORUS: 3.2 mg/dL (ref 2.5–4.6)

## 2015-01-23 LAB — BASIC METABOLIC PANEL
ANION GAP: 8 (ref 5–15)
BUN: 20 mg/dL (ref 6–20)
CALCIUM: 10.1 mg/dL (ref 8.9–10.3)
CO2: 21 mmol/L — ABNORMAL LOW (ref 22–32)
Chloride: 121 mmol/L — ABNORMAL HIGH (ref 101–111)
Creatinine, Ser: 1.27 mg/dL — ABNORMAL HIGH (ref 0.44–1.00)
GFR, EST AFRICAN AMERICAN: 54 mL/min — AB (ref >60)
GFR, EST NON AFRICAN AMERICAN: 46 mL/min — AB (ref >60)
GLUCOSE: 144 mg/dL — AB (ref 65–99)
Potassium: 3.2 mmol/L — ABNORMAL LOW (ref 3.5–5.1)
SODIUM: 150 mmol/L — AB (ref 135–145)

## 2015-01-23 LAB — CREATININE, SERUM
CREATININE: 1.27 mg/dL — AB (ref 0.44–1.00)
GFR calc Af Amer: 54 mL/min — ABNORMAL LOW (ref >60)
GFR calc non Af Amer: 46 mL/min — ABNORMAL LOW (ref >60)

## 2015-01-23 LAB — AMMONIA: AMMONIA: 46 umol/L — AB (ref 9–35)

## 2015-01-23 LAB — MAGNESIUM: MAGNESIUM: 1.8 mg/dL (ref 1.7–2.4)

## 2015-01-23 LAB — VITAMIN B12: VITAMIN B 12: 514 pg/mL (ref 180–914)

## 2015-01-23 MED ORDER — SODIUM CHLORIDE 0.9 % IV SOLN
50.0000 mg | Freq: Two times a day (BID) | INTRAVENOUS | Status: DC
  Administered 2015-01-23 – 2015-01-24 (×4): 50 mg via INTRAVENOUS
  Filled 2015-01-23 (×9): qty 5

## 2015-01-23 MED ORDER — ACETAMINOPHEN 650 MG RE SUPP
650.0000 mg | RECTAL | Status: DC | PRN
  Administered 2015-01-23 – 2015-01-28 (×9): 650 mg via RECTAL
  Filled 2015-01-23 (×9): qty 1

## 2015-01-23 MED ORDER — POTASSIUM CHLORIDE 10 MEQ/100ML IV SOLN
10.0000 meq | INTRAVENOUS | Status: AC
  Administered 2015-01-23 (×3): 10 meq via INTRAVENOUS
  Filled 2015-01-23 (×2): qty 100

## 2015-01-23 MED ORDER — SODIUM CHLORIDE 0.9 % IV SOLN
500.0000 mg | Freq: Two times a day (BID) | INTRAVENOUS | Status: DC
  Administered 2015-01-23 – 2015-01-25 (×4): 500 mg via INTRAVENOUS
  Filled 2015-01-23 (×5): qty 5

## 2015-01-23 NOTE — Progress Notes (Addendum)
Subjective: In bed, talking but nonsensical majority of the time.  Is able to tell me she had a stroke in the past which left her with left sided paresis.   Exam: Filed Vitals:   01/23/15 0938  BP: 148/73  Pulse: 75  Temp: 99.3 F (37.4 C)  Resp: 18    HEENT-  Normocephalic, no lesions, without obvious abnormality.  Normal external eye and conjunctiva.  Normal TM's bilaterally.  Normal auditory canals and external ears. Normal external nose, mucus membranes and septum.  Normal pharynx. Cardiovascular- S1, S2 normal, pulses palpable throughout   Lungs- chest clear, no wheezing, rales, normal symmetric air entry, Heart exam - S1, S2 normal, no murmur, no gallop, rate regular Abdomen- normal findings: bowel sounds normal     Gen: In bed, NAD MS: awake and alert, able to tell me she has residual weakness on the left from stroke, able to follow simple commands such as squeezing fingers with right hand.  CN: Left facial droop, no blink to threat on the left, PERRLA, EOMI, sensation in tact.  Motor: able to move her right arm antigravity and squeeze with right hand. Left arm held in flexion contraction with fist clenched.  Not movement of left leg or right leg.  Sensory: decreased on the left side.    Pertinent Labs/diagnostics: MRI brain: 1. Slight interval increase in size of heterogeneously enhancing left occipital lobe lesion now measuring 19 x 16 x 15 mm with slightly increased vasogenic edema. Given the relative increase in size of this lesion, this is felt to be most consistent with tumor given its progression relative to previous MRI from 11/06/2014. 2. 10 mm right periventricular enhancing nodule, not significantly changed relative to previous MRI from 12/08/2014, but increased in size relative to 11/06/2014. No associated edema.  UA (-) for UTI Na 150 K+ 3.2 Cr. 1.2   EEG pending  Etta Quill PA-C [facility] [phone]  Impression: 54 yo F with  multifactorial worsening in encephalopathy including likely dehhydration, enlarging mass, hypernatremia. She is having the medical issues addressed, but I think we will need to decide on a course of action with her re: biopsy or excision vs palliation.   EEG revealed no ongoing seizure activity.   Recommendations: 1) continue treatment of hypernatremia. 2) will need to discuss further options regarding treatment of brain mass with ocnology   Roland Rack, MD [facility] [phone]  If 7pm- 7am, please page neurology on call as listed in [place].  01/23/2015, 10:00 AM

## 2015-01-23 NOTE — Progress Notes (Signed)
SLP Cancellation Note  Patient Details Name: [name] MRN: [id number] DOB: Jun 02, 1961   Cancelled treatment:       Reason Eval/Treat Not Completed: Patient at procedure or test/unavailable. Pt currently NPO.  Continues to have AMS per PA. Will continue efforts.  [facility], CCC-SLP [phone]  Shonna Chock 01/23/2015, 10:41 AM

## 2015-01-23 NOTE — Progress Notes (Signed)
Utilization review completed. Lacorey Brusca, RN, BSN. 

## 2015-01-23 NOTE — Progress Notes (Signed)
EEG Completed; Results Pending  

## 2015-01-23 NOTE — Procedures (Signed)
ELECTROENCEPHALOGRAM REPORT  Patient: [name]       Room #: 8M75 EEG No. ID: [id number] Age: 54 y.o.        Sex: female Referring Physician: Rudolpho Sevin Report Date:  01/23/2015        Interpreting Physician: Anthony Sar  History: ZOOEY SCHREURS is an 54 y.o. female with a history of subarachnoid and subdural hemorrhages as well as astrocytoma resection, radiation therapy and seizure disorder admitted with altered mental status and cortical breakthrough seizures.  Indications for study:  Assess severity of encephalopathy; rule out seizure activity.  Technique: This is an 18 channel routine scalp EEG performed at the bedside with bipolar and monopolar montages arranged in accordance to the international 10/20 system of electrode placement.   Description: This EEG recording was performed during wakefulness. Patient was noted to be confused during the recording. Predominant activity consisted of moderate amplitude diffuse mixed symmetrical delta and theta activity as well as intermittent occurrences of higher amplitude generalized rhythmic delta activity lasting up to 4 seconds. Photic stimulation was not performed. No epileptiform discharges were recorded, including during numerous episodes of involuntary tremor-like activity involving right extremities.  Interpretation: This EEG is abnormal with moderate generalized nonspecific continuous slowing of cerebral activity. This pattern of slowing can be seen with degenerative as well as with metabolic/toxic encephalopathic states. No evidence of seizure activity was recorded.   Rush Farmer M.D. [facility] [phone]

## 2015-01-23 NOTE — Progress Notes (Signed)
[facility] PROGRESS NOTE  [name] JQB:[id number] DOB: April 17, 1961 DOA: 01/22/2015 PCP: [name], DO  Assessment/Plan: 1-Metabolic encephalopathy - Etiology may be multifactorial hypernatremia, metabolic,  as well as new report of hypodensity in the left occipital lobe suggesting new or progressed cerebral edema. Neurosurgeon consulted and plan is for dexamethasone. Radiation oncologist consulted.  - UA results reviewed currently none consistent with source of infectious etiology - Neurology and neurosurgery consulted and assisting -father report that patient pain medications was change recently. Will ask pharmacy to review medications or get records from facility.    brain tumor - Patient has history of this and based on chart patient is status post radiation and excision in 2012 with history of subsequent subarachnoid hemorrhage 2014 -MRI with increase in size of brain mass and edema.  -Was started on IV decadron on admission. -I have consulted neurosurgery.  -I left message for Radiation oncologist at the office.. Dr [name] is off today. He will not be in [facility]. Dr [name] to see patient 8-24.   Hypernatremia - Provide gentle rehydration.  -Continue with IV fluids, will increase rate to 75 cc/hr.   Hypokalemia -IV fluids with Potasium.  -IV kcl ordered.    Seizures - I have Change Keppra and Vimpat to IV.   Leukopenia; check B 12 and HIV. Follow trend.  Back pain; will check X ray due to recent fall.    Depression - continue home medication regimen.  Code Status: Full Code.  Family Communication: care discussed with patient.  Disposition Plan: Remain inpatient    Consultants:  Neurology  neurosurgery  Procedures:  none  Antibiotics:  none  HPI/Subjective: Complaints of lower back pain.  She has not been drinking enough fluids.  Her father was at bedside. This is not patient baseline. She was not answering questions at her  facility for last few days. He relates that patient fell of her wheelchair at the facility and hit her head,   Objective: Filed Vitals:   01/23/15 1507  BP: 145/76  Pulse: 72  Temp: 98.9 F (37.2 C)  Resp: 18   No intake or output data in the 24 hours ending 01/23/15 1627 There were no vitals filed for this visit.  Exam:   General:  More alert, answer some questions. Keep eyes close.   Cardiovascular: S 1, S 2 RRR  Respiratory: CTA  Abdomen: BS present, soft, nt  Musculoskeletal: trace edema  Neuro; open eyes to voice, answer some questions. Left side old hemiplegia.   Data Reviewed: Basic Metabolic Panel:  Recent Labs Lab 01/22/15 1500 01/22/15 2300 01/23/15 0626  NA 152*  --  150*  K 3.4*  --  3.2*  CL 119*  --  121*  CO2 21*  --  21*  GLUCOSE 98  --  144*  BUN 19  --  20  CREATININE 1.30* 1.27* 1.27*  CALCIUM 10.5*  --  10.1  MG  --  1.8  --   PHOS  --  3.2  --    Liver Function Tests:  Recent Labs Lab 01/22/15 1500  AST 42*  ALT 27  ALKPHOS 77  BILITOT 0.8  PROT 6.3*  ALBUMIN 3.6   No results for input(s): LIPASE, AMYLASE in the last 168 hours. No results for input(s): AMMONIA in the last 168 hours. CBC:  Recent Labs Lab 01/22/15 1500 01/22/15 2300 01/23/15 0626  WBC 5.5 3.7* 3.0*  NEUTROABS 3.0  --   --   HGB 11.0*  10.5* 10.0*  HCT 33.9* 33.0* 31.9*  MCV 82.3 83.3 83.5  PLT 120* 104* 117*   Cardiac Enzymes:  Recent Labs Lab 01/22/15 1500  TROPONINI <0.03   BNP (last 3 results) No results for input(s): BNP in the last 8760 hours.  ProBNP (last 3 results) No results for input(s): PROBNP in the last 8760 hours.  CBG: No results for input(s): GLUCAP in the last 168 hours.  No results found for this or any previous visit (from the past 240 hour(s)).   Studies: Dg Chest 1 View  01/22/2015   CLINICAL DATA:  Mental status changes  EXAM: CHEST  1 VIEW  COMPARISON:  11/05/2014  FINDINGS: Cardiomegaly again noted. Chronic  elevation of the right hemidiaphragm. No acute infiltrate or pleural effusion. No pulmonary edema.  IMPRESSION: No active disease.  Chronic elevation of the right hemidiaphragm.   Electronically Signed   By: Lahoma Crocker M.D.   On: 01/22/2015 15:50   Ct Head Wo Contrast  01/22/2015   CLINICAL DATA:  54 year old female with increased lethargy and abnormal potassium level. Initial encounter.  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  EXAM: CT HEAD WITHOUT CONTRAST  TECHNIQUE: Contiguous axial images were obtained from the base of the skull through the vertex without intravenous contrast.  COMPARISON:  Post treatment brain MRI 12/08/2014 and earlier.  FINDINGS: Sequelae of right posterior convexity craniotomy. No acute osseous abnormality identified. Visualized paranasal sinuses and mastoids are clear. No acute orbit or scalp soft tissue findings.  Right ICA siphon vascular stent appears stable in configuration. Right parietal resection cavity measuring up to 5 cm is stable since the July MRI. Hypodensity in the surrounding right hemisphere white matter corresponds to T2 hyperintensity on that study. Posterior right temporal lobe encephalomalacia a re- demonstrated. Stable ventricle size and configuration. Heterogeneity in the left occipital lobe at the site of intra-axial hemorrhage shows regression of CT hyperdensity since June. However, there is increased surrounding hypodensity suggesting increased occipital lobe edema (series 201, images 14 and 15 today versus series 2, images 16 and 17 in June). No superimposed acute intracranial hemorrhage identified. Small volume right hemisphere extra-axial collection appears stable. Basilar cisterns remain patent.  IMPRESSION: 1. Chronic and post treatment changes to the brain. Increased  hypodensity in the left occipital lobe since June suggesting new or progressed cerebral edema at that site. 2. No acute intracranial hemorrhage or new cortically based infarct identified.   Electronically Signed   By: Genevie Ann M.D.   On: 01/22/2015 15:55   Mr Jeri Cos RX Contrast  01/22/2015   CLINICAL DATA:  Initial evaluation for acute altered mental status. History of right parietal astrocytoma.  EXAM: MRI HEAD WITHOUT AND WITH CONTRAST  TECHNIQUE: Multiplanar, multiecho pulse sequences of the brain and surrounding structures were obtained without and with intravenous contrast.  CONTRAST:  58mL MULTIHANCE GADOBENATE DIMEGLUMINE 529 MG/ML IV SOLN  COMPARISON:  Prior CT from earlier the same day as well as previous MRI from 12/08/2014.  FINDINGS: The right parietal resection cavity is similar in size relative to previous study. Restricted diffusion at the superior aspect of the cavity slightly less prominent as compared to previous study. Layering hematocrit level again noted within the resection cavity. Chronic hemosiderin staining about the resection cavity. Additionally, there is chronic hemosiderin staining overlying the right cerebral convexity. No  significant enhancement about the resection cavity itself.  Remote right MCA territory infarct with associated laminar necrosis again noted. T2/FLAIR changes throughout the right cerebral hemisphere are similar to previous, related to post treatment changes. Associated ex vacuo dilatation of the right lateral ventricle. Slight left-to-right midline shift is stable. No hydrocephalus. Dural thickening and enhancement overlying the right cerebral hemisphere is stable.  10 mm focus of nodular enhancement along the body of the right lateral ventricle noted, similar to previous. Additional heterogeneous enhancing hemorrhagic lesion within the left occipital lobe is slightly increased in size measuring 19 x 16 x 15 mm. Specifically, this lesion has increased in size along  its anterior margin. Small amount of hemorrhage within the adjacent left occipital lobe, and likely the right occipital lobe as well, slightly less conspicuous as compared to prior, although this may be related to differences in technique as that study was performed on the 3 Tesla magnet and as opposed to the 1.5 Tesla on this study. Associated vasogenic edema within the left occipital lobe appears slightly increased. Given the relative increase in size from most recent MRI from 12/08/2014, this is favored to reflect tumor rather than hematoma.  No acute large vessel territory infarct. Normal intravascular flow voids are maintained.  Craniocervical junction within normal limits. Pituitary gland normal.  No acute abnormality about the orbits.  Mild mucosal thickening within the maxillary sinuses bilaterally. Paranasal sinuses are otherwise clear. Trace opacity within the left mastoid air cells. Inner ear structures grossly normal.  Bone marrow signal intensity within normal limits. Scalp soft tissues unremarkable.  IMPRESSION: 1. Slight interval increase in size of heterogeneously enhancing left occipital lobe lesion now measuring 19 x 16 x 15 mm with slightly increased vasogenic edema. Given the relative increase in size of this lesion, this is felt to be most consistent with tumor given its progression relative to previous MRI from 11/06/2014. 2. 10 mm right periventricular enhancing nodule, not significantly changed relative to previous MRI from 12/08/2014, but increased in size relative to 11/06/2014. No associated edema. 3. Stable postoperative changes from previous right parietal lobe tumor resection. 4. Remote right MCA territory infarct surrounding the resection cavity, stable.   Electronically Signed   By: Jeannine Boga M.D.   On: 01/22/2015 23:23    Scheduled Meds: . ARIPiprazole  7 mg Oral Daily  . baclofen  10 mg Oral BID  . dexamethasone  4 mg Intravenous 4 times per day  . FLUoxetine  60  mg Oral Daily  . heparin  5,000 Units Subcutaneous 3 times per day  . lacosamide (VIMPAT) IV  50 mg Intravenous Q12H  . lamoTRIgine  100 mg Oral Daily  . levETIRAcetam  500 mg Intravenous Q12H  . potassium chloride  10 mEq Intravenous Q1 Hr x 3  . sodium chloride  3 mL Intravenous Q12H   Continuous Infusions: . dextrose 5 % and 0.45 % NaCl with KCl 20 mEq/L 50 mL/hr at 01/22/15 2247    Principal Problem:   Metabolic encephalopathy Active Problems:   Astrocytoma brain tumor   Seizures   Acute encephalopathy   Depression    Time spent: 35 minutes     [place], [facility] Pager [phone]. If 7PM-7AM, please contact night-coverage at [email or web address], password [facility] 01/23/2015, 4:27 PM  LOS: 1 day

## 2015-01-23 NOTE — Evaluation (Signed)
Clinical/Bedside Swallow Evaluation Patient Details  Name: [name] MRN: [id number] Date of Birth: 06/15/1960  Today's Date: 01/23/2015 Time: SLP Start Time (ACUTE ONLY): 1200 SLP Stop Time (ACUTE ONLY): 1230 SLP Time Calculation (min) (ACUTE ONLY): 30 min  Past Medical History:  Past Medical History  Diagnosis Date  . 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 Fracture, ankle 2012    right  . History of radiation therapy 09/23/10- 11/07/10    right parietal lobe  . Chronic headaches     uses oxycodone appropriately  . Anxiety   . Muscle spasticity   . Seizures   . Dysphagia   . GERD (gastroesophageal reflux disease)   . Nontraumatic subarachnoid hemorrhage   . Pure hypercholesterolemia   . Mild mood disorder   . Atypical psychosis   . Epilepsy   . Problems with swallowing and mastication   . Depression    Past Surgical History:  Past Surgical History  Procedure Laterality Date  . Orif ankle fracture  08/22/2010    right  . Radiology with anesthesia N/A 05/02/2013    Procedure: RADIOLOGY WITH ANESTHESIA;  Surgeon: Consuella Lose, MD;  Location: [place];  Service: Radiology;  Laterality: N/A;  . Brain surgery Right 2012    astrocytoma R parietal lobe   HPI:  54 year old female admitted 01/22/15 due to AMS. PMH significant for GERD, right parietal tumor (2012), SAH (2014), SDH, R MCA CVA, dysphagia. MRI revealed Left occipital lesion.   Assessment / Plan / Recommendation Clinical Impression  Oral care completed with suction. Pt oral cavity was noted to be dry and cracked. Moisturizer applied to lips. Pt was given ice chips, water, and puree. Extended oral prep and poor laryngeal elevation noted. Pt required cues to swallow, and exhibited strong cough response to thin liquid.  Pt is markedly confused at this time, which is different from her baseline per father. Recommend continuing NPO for now, with reassessment at bedside for po/objective study  readiness. RN and father informed of results and recommendations.    Aspiration Risk  Severe    Diet Recommendation NPO   Medication Administration: Via alternative means    Other  Recommendations Oral Care Recommendations: Oral care QID   Follow Up Recommendations       Frequency and Duration min 1 x/week  1 week   Pertinent Vitals/Pain Pt responded "yes" to "are you in pain", but was unable to indicate location or severity.    SLP Swallow Goals  Tolerate least restrictive diet level without overt s/s aspiration prior to DC   Swallow Study Prior Functional Status    Pt on soft foods with pureed meats, thin liquids prior to admit    General Date of Onset: 01/22/15 Other Pertinent Information: 54 year old female admitted 01/22/15 due to AMS. PMH significant for GERD, right parietal tumor (2012), SAH (2014), SDH, R MCA CVA, dysphagia. MRI revealed Left occipital lesion. Type of Study: Bedside swallow evaluation Previous Swallow Assessment: BSE 11/11/14 - dys 3/thin Diet Prior to this Study: NPO Temperature Spikes Noted: No Respiratory Status: Room air History of Recent Intubation: No Behavior/Cognition: Confused;Pleasant mood;Cooperative;Alert;Requires cueing;Doesn't follow directions Self-Feeding Abilities: Total assist Patient Positioning: Upright in bed Baseline Vocal Quality: Normal Volitional Cough: Cognitively unable to elicit Volitional Swallow: Unable to elicit    Oral/Motor/Sensory Function Overall Oral Motor/Sensory Function: Impaired at baseline Labial ROM: Reduced left Labial Symmetry: Abnormal symmetry left Labial Strength: Reduced Lingual ROM: Reduced left Lingual  Symmetry: Abnormal symmetry left Lingual Strength: Reduced Facial ROM: Reduced left Facial Symmetry: Left droop Facial Strength: Reduced Velum: Within Functional Limits Mandible: Within Functional Limits   Ice Chips Ice chips: Impaired Presentation: Spoon Oral Phase Impairments: Poor  awareness of bolus Pharyngeal Phase Impairments: Suspected delayed Swallow   Thin Liquid Thin Liquid: Impaired Presentation: Straw Oral Phase Impairments: Poor awareness of bolus Oral Phase Functional Implications: Oral holding Pharyngeal  Phase Impairments: Suspected delayed Swallow;Cough - Immediate;Decreased hyoid-laryngeal movement    Nectar Thick Nectar Thick Liquid: Not tested   Honey Thick Honey Thick Liquid: Not tested   Puree Puree: Impaired Presentation: Spoon Oral Phase Impairments: Poor awareness of bolus Oral Phase Functional Implications: Prolonged oral transit;Oral holding Pharyngeal Phase Impairments: Suspected delayed Swallow;Decreased hyoid-laryngeal movement   Solid   GO    Solid: Not tested      [facility], [place] [phone]  Shonna Chock 01/23/2015,1:36 PM

## 2015-01-23 NOTE — Progress Notes (Signed)
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 She is admitted for altered mental status. Her father is at bedside who reports she was very confused and somnolent over the last few days. She currently resides at [facility] assisted [facility] facility. She remains wheechair bound since her stroke 2 yrs ago, and requires assistance for most ADLs.  EXAM:  BP 140/78 mmHg  Pulse 74  Temp(Src) 100.7 F (38.2 C) (Rectal)  Resp 20  SpO2 95%  LMP 08/26/2010  Awake, alert, oriented  Speech fluent but confused Left facial droop LUE with flexion contracture 3/5 RUE, 1-2/5 RLE. Able to wiggle toes LLE  MRI Brain  There is increase in size of left occipital enhancing lesion in comparison to MRI of 11/06/14. There is also increase in surrounding edema. The small enhancing nodule adjacent to the body of the right lateral ventricle is slightly enlarged in comparison to 11/06/14.  IMPRESSION:  54 y.o. female with multifactorial encephalopathy, including dehydration and hypernatremia. She does have evidence of increased size of enhancing lesion in the left occipital lobe most concerning for recurrent tumor given her history of anaplastic astrocytoma. Unfortunately given her current functional status I don't think aggressive surgical treatment followed by salvage chemotherapy is something she would tolerate.  PLAN: - Will discuss this case with Dr. [name] from rad onc - cont dexamethasone  I have discussed the situation with the patient's father at bedside. I explained to him the options of aggressive mgmt of this likely tumor vs a palliative approach to her care. I did tell him I do not believe she would tolerate surgery and subsequent salvage chemo. He appeared to understand our discussion. All questions were answered.

## 2015-01-24 ENCOUNTER — Ambulatory Visit: Payer: Medicare Other | Admitting: Radiation Oncology

## 2015-01-24 DIAGNOSIS — C719 Malignant neoplasm of brain, unspecified: Secondary | ICD-10-CM

## 2015-01-24 DIAGNOSIS — D638 Anemia in other chronic diseases classified elsewhere: Secondary | ICD-10-CM

## 2015-01-24 LAB — GLUCOSE, CAPILLARY
GLUCOSE-CAPILLARY: 129 mg/dL — AB (ref 65–99)
GLUCOSE-CAPILLARY: 153 mg/dL — AB (ref 65–99)

## 2015-01-24 LAB — HIV ANTIBODY (ROUTINE TESTING W REFLEX): HIV SCREEN 4TH GENERATION: NONREACTIVE

## 2015-01-24 MED ORDER — ENSURE ENLIVE PO LIQD
237.0000 mL | Freq: Two times a day (BID) | ORAL | Status: DC
  Administered 2015-01-24 – 2015-01-29 (×10): 237 mL via ORAL
  Filled 2015-01-24 (×13): qty 237

## 2015-01-24 NOTE — Progress Notes (Signed)
Speech Language Pathology Treatment: Dysphagia  Patient Details Name: [name] MRN: [id number] DOB: 1960-06-08 Today's Date: 01/24/2015 Time: 6270-3500 SLP Time Calculation (min) (ACUTE ONLY): 33 min  Assessment / Plan / Recommendation Clinical Impression  Pt with improved mentation today! Able to intermittently follow directions with delay - approximately 75% of opportunities.  Pt given ice chips, water, nectar juice, applesauce, and moist cracker.  Delayed oral transiting noted and suspected delayed pharyngeal swallow but no indication of airway compromise.  Minimal oral pocketing on left due to sensorimotor deficits, however effectively cleared with liquid wash.  Pt admits to premorbid occasional cough with liquids- when ?d if due to pace of feeding, she concurred.  CXR negative upon admit.  Will start dys3/thin diet with aspiration precautions - pt agreeable to plan.  Informed nurse tech and rn to recommendations.  Placed swallow precaution sign above bed.  SLP to follow up x1 to assure tolerance of diet.     HPI Other Pertinent Information: 55 year old female admitted 01/22/15 due to AMS. PMH significant for GERD, right parietal tumor (2012), SAH (2014), SDH, R MCA CVA, dysphagia. MRI revealed Left occipital lesion.  Pt for swallow evaluation, yesterday was not ready for po intake due to AMS.  Pt today seen to assess po readiness.     Pertinent Vitals Pain Assessment: Faces Pain Score: 2  Faces Pain Scale: Hurts a little bit Pain Intervention(s): Limited activity within patient's tolerance;Repositioned  SLP Plan       Recommendations Diet recommendations: Dysphagia 3 (mechanical soft);Thin liquid Liquids provided via: Cup;Straw Medication Administration: Whole meds with puree Supervision: Full supervision/cueing for compensatory strategies;Staff to assist with self feeding Compensations: Slow rate;Small sips/bites;Check for pocketing Postural Changes and/or Swallow Maneuvers:  Seated upright 90 degrees;Upright 30-60 min after meal              Oral Care Recommendations: Oral care BID    GO    Luanna Salk, [place] [facility] SLP [phone]

## 2015-01-24 NOTE — Progress Notes (Signed)
Initial Nutrition Assessment   INTERVENTION:  Provide Ensure Enlive po BID, each supplement provides 350 kcal and 20 grams of protein  Monitor PO intake for adequacy  NUTRITION DIAGNOSIS:   Predicted suboptimal nutrient intake related to lethargy/confusion as evidenced by meal completion < 50%.   GOAL:   Patient will meet greater than or equal to 90% of their needs   MONITOR:   PO intake, Supplement acceptance, Labs, Weight trends, Skin  REASON FOR ASSESSMENT:   Low Braden    ASSESSMENT:   53 y.o. 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 Presents again this admission secondary to altered mental status which was noticed at facility.  Pt's diet was advacned this morning to Dysphagia 3 after remaining NPO for 2 days. Lunch tray at bedside with about 40% consumed; states she feels full. She reports eating well PTA, she sometimes drinks Ensure. Per weight history, pt's weight has been stable although no recent weight on file.  Labs reviewed.   Diet Order:  DIET DYS 3 Room service appropriate?: No; Fluid consistency:: Thin  Skin:  Reviewed, no issues  Last BM:  8/22  Height:   Ht Readings from Last 1 Encounters:  01/24/15 5\' 9"  (1.753 m)    Weight:   Wt Readings from Last 1 Encounters:  12/19/14 155 lb (70.308 kg)    Ideal Body Weight:  65.9 kg  BMI:  There is no weight on file to calculate BMI.  Estimated Nutritional Needs:   Kcal:  1600-1800  Protein:  85-100 grams  Fluid:  1.6-1.8 L/day  EDUCATION NEEDS:   No education needs identified at this time  [place], LDN Inpatient Clinical Dietitian Pager: [phone] After Hours Pager: [phone]

## 2015-01-24 NOTE — Clinical Social Work Note (Signed)
CSW has completed FL-2 and faxed clinicals for review to [facility].  Glendon Axe, MSW, LCSWA [phone] 01/24/2015 12:18 PM

## 2015-01-24 NOTE — Progress Notes (Addendum)
Patient ID: [name], female   DOB: Dec 08, 1960, 54 y.o.   MRN: [id number] [facility] PROGRESS NOTE  [name] KDT:[id number] DOB: 1960/06/29 DOA: 01/22/2015 PCP: [name], DO  Brief narrative:    54 y.o. 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 Patient has had a prior admissions for altered mental status secondary to urinary tract infection.  On this admission, patient presented with altered mental status as well. MRI on this admission demonstrated slight interval increase in size of heterogeneously enhancing left occipital lobe lesion now measuring 19 x 16 x 15 mm with slightly increased vasogenic edema and given the relative increase in the size of this lesion this is felt to be most consistent with tumor and its progression. Neurosurgery has seen the patient in consultation and does not feel that patient is a candidate for surgical intervention. We are awaiting input from radiation oncology.   Assessment/Plan:    Principal problem: Acute encephalopathy secondary to progression of brain tumor / right parietal astrocytoma / left occipital lobe lesion with increased vasogenic edema / seizures  - Patient was previously treated for anaplastic astrocytoma about 4 years ago with good local control but unfortunately suffered a debilitating stroke thereafter. Her baseline performance status have declined since. She has had previous presentations with altered mental status which were thought to be secondary to UTI. Transiently mental status would improve with treatment for UTI. - Patient has seen Dr. [name] of radiation oncology, last appointment 12/15/2014 at which time patient and family decided that they will have follow-up MRI. She did have MRI in June 2016 which showed new  left-sided 11 mm enhancing lesion suggestive of malignancy. - MRI on this admission demonstrates interval increase in the size of left occipital lobe lesion with increased vasogenic edema. - Neurosurgery has seen the patient in consultation, does not feel that patient is a surgical candidate at this time. - Awaiting input from radiation oncology - Continue IV Decadron - Continue Keppra and Vimpat for seizures neurology has seen the patient in consultation as well. - PT evaluation once pt able to participate   Active problems: Hypernatremia - Likely from dehydration secondary to poor mental status - Continue IV fluids  Hypokalemia - Likely from poor by mouth intake - Supplemented - Follow-up BMP tomorrow morning  Leukopenia - Secondary to history of malignancy. No neutropenia. - Continue to monitor CBC.  Anemia of chronic disease / thrombocytopenia - Secondary to history of malignancy - Hemoglobin is stable - No current indications for transfusion  Chronic kidney disease, stage III - Baseline creatinine 1.18 and slightly above the baseline range, 1.30 on this admission. Likely prerenal from poor by mouth intake, dehydration - Continue fluids - Creatinine improving slightly  Depression - Continue Abilify and Prozac    DVT Prophylaxis  - SCDs bilaterally    Code Status: Full.  Family Communication:  family not at the bedside this morning Disposition Plan: Home when stable. needs physical therapy evaluation   IV access:  Peripheral IV  Procedures and diagnostic studies:    Dg Chest 1 View 01/22/2015 No active disease.  Chronic elevation of the right hemidiaphragm.     Dg Lumbar Spine 2-3 Views 01/23/2015  Markedly limited exam due to patient rotation on AP view with leftward curvature of the lumbar spine.  Lateral view demonstrates  preservation of the vertebral body and intervertebral disc space heights.  Gaseous distention of small and large bowel with large amount of  stool within the colon and rectum as can be seen with constipation and/or impaction.    Ct Head Wo Contrast 01/22/2015  1. Chronic and post treatment changes to the brain. Increased hypodensity in the left occipital lobe since June suggesting new or progressed cerebral edema at that site. 2. No acute intracranial hemorrhage or new cortically based infarct identified.     Mr [name] Contrast 01/22/2015    1. Slight interval increase in size of heterogeneously enhancing left occipital lobe lesion now measuring 19 x 16 x 15 mm with slightly increased vasogenic edema. Given the relative increase in size of this lesion, this is felt to be most consistent with tumor given its progression relative to previous MRI from 11/06/2014. 2. 10 mm right periventricular enhancing nodule, not significantly changed relative to previous MRI from 12/08/2014, but increased in size relative to 11/06/2014. No associated edema. 3. Stable postoperative changes from previous right parietal lobe tumor resection. 4. Remote right MCA territory infarct surrounding the resection cavity, stable.    Medical Consultants:  Neurology Neurosurgery  Other Consultants:  SLP Physical therapy  IAnti-Infectives:   None    Leisa Lenz, MD  [facility] Pager [phone]  Time spent in minutes: 25 minutes  If 7PM-7AM, please contact night-coverage [email or web address] Password [email or web address] 01/24/2015, 1:00 PM   LOS: 2 days    HPI/Subjective: No acute overnight events. Patient reports generalized pain.   Objective: Filed Vitals:   01/23/15 2117 01/24/15 0137 01/24/15 0526 01/24/15 1034  BP: 144/83 141/77 136/70 135/76  Pulse: 71 73 73 72  Temp: 99.8 F (37.7 C) 99 F (37.2 C) 99 F (37.2 C) 98.9 F (37.2 C)  TempSrc: Oral Oral Oral Oral  Resp: 20 20 20 18   SpO2: 94% 95% 94% 95%   No intake or output data in the 24 hours ending 01/24/15 1300  Exam:   General:  Pt is alert, not in acute distress, oriented to self only    Cardiovascular: Regular rate and rhythm, S1/S2 appreciated   Respiratory: Clear to auscultation bilaterally, no wheezing, no crackles, no rhonchi  Abdomen: Soft, non tender, non distended, bowel sounds present  Extremities: No edema, pulses DP and PT palpable bilaterally  Neuro: Grossly nonfocal  Data Reviewed: Basic Metabolic Panel:  Recent Labs Lab 01/22/15 1500 01/22/15 2300 01/23/15 0626  NA 152*  --  150*  K 3.4*  --  3.2*  CL 119*  --  121*  CO2 21*  --  21*  GLUCOSE 98  --  144*  BUN 19  --  20  CREATININE 1.30* 1.27* 1.27*  CALCIUM 10.5*  --  10.1  MG  --  1.8  --   PHOS  --  3.2  --    Liver Function Tests:  Recent Labs Lab 01/22/15 1500  AST 42*  ALT 27  ALKPHOS 77  BILITOT 0.8  PROT 6.3*  ALBUMIN 3.6   No results for input(s): LIPASE, AMYLASE in the last 168 hours.  Recent Labs Lab 01/23/15 1720  AMMONIA 46*   CBC:  Recent Labs Lab 01/22/15 1500 01/22/15 2300 01/23/15 0626  WBC 5.5 3.7* 3.0*  NEUTROABS 3.0  --   --   HGB 11.0* 10.5* 10.0*  HCT 33.9* 33.0* 31.9*  MCV 82.3 83.3 83.5  PLT 120* 104* 117*   Cardiac Enzymes:  Recent Labs Lab  01/22/15 1500  TROPONINI <0.03   BNP: Invalid input(s): POCBNP CBG: No results for input(s): GLUCAP in the last 168 hours.  No results found for this or any previous visit (from the past 240 hour(s)).   Scheduled Meds: . ARIPiprazole  7 mg Oral Daily  . baclofen  10 mg Oral BID  . dexamethasone  4 mg Intravenous 4 times per day  . FLUoxetine  60 mg Oral Daily  . heparin  5,000 Units Subcutaneous 3 times per day  . lacosamide (VIMPAT) IV  50 mg Intravenous Q12H  . lamoTRIgine  100 mg Oral Daily  . levETIRAcetam  500 mg Intravenous Q12H  . sodium chloride  3 mL Intravenous Q12H   Continuous Infusions: . dextrose 5 % and 0.45 % NaCl with KCl 20 mEq/L 75 mL/hr at 01/24/15 [phone]

## 2015-01-24 NOTE — Progress Notes (Signed)
Subjective: No complaints.  Exam: Filed Vitals:   01/24/15 0526  BP: 136/70  Pulse: 73  Temp: 99 F (37.2 C)  Resp: 20    HEENT-  Normocephalic, no lesions, without obvious abnormality.  Normal external eye and conjunctiva.  Normal TM's bilaterally.  Normal auditory canals and external ears. Normal external nose, mucus membranes and septum.  Normal pharynx. Cardiovascular- S1, S2 normal, pulses palpable throughout   Lungs- chest clear, no wheezing, rales, normal symmetric air entry Abdomen- normal findings: bowel sounds normal Extremities- no edema   Gen: In bed, NAD MS: awake and alert, currently with speech therapy, able to follow simple commands such as squeezing fingers with right hand.  CN: Left facial droop, no blink to threat on the left, PERRLA, EOMI, sensation in tact.  Motor: able to move her right arm antigravity and squeeze with right hand. Left arm held in flexion contraction with fist clenched.at rest has a right sided tremor but stops when I place my hand on her. Not movement of left leg or right leg.  Sensory: decreased on the left side      Pertinent Labs: NONE  Etta Quill PA-C [facility] [phone]  Impression:  54 yo F with multifactorial worsening in encephalopathy including likely dehhydration, enlarging mass, hypernatremia. She has had significant improvement since admission, but continues to be altered.   Recommendations: 1) Continue supportive care 2) Would suggest palliative care consult given rad onc and nsgy recommendations.    Roland Rack, MD [facility] [phone]  If 7pm- 7am, please page neurology on call as listed in [place].  01/24/2015, 9:36 AM

## 2015-01-24 NOTE — Progress Notes (Signed)
  Radiation Oncology         [phone] ________________________________  Name: [name] MRN: [id number]  Date: 01/22/2015  DOB: Mar 03, 1961  Chart Note:  I reviewed this patient's most recent findings and wanted to take a minute to document my impression.  I am in agreement with Dr. Kathyrn Sheriff.  This patient has poor performance status and the only potential treatment options such as surgery, chemo or radiotherapy are likely to introduce risk of harm without necessarily providing any palliative benefit and limited efficacy.  I would not recommend radiotherapy at this time.  ________________________________  Sheral Apley [name], M.D.

## 2015-01-24 NOTE — Clinical Social Work Note (Signed)
Clinical Social Work Assessment  Patient Details  Name: [name] MRN: [id number] Date of Birth: August 23, 1960  Date of referral:  01/24/15               Reason for consult:   (Admitted from [place] )                Permission sought to share information with:  Case Manager, Family Supports Permission granted to share information::  Yes, Verbal Permission Granted  Name::      [name])  Agency::   [facility] )  Relationship::   (Father and HCPOA )  Contact Information:   [phone])  Housing/Transportation Living arrangements for the past 2 months:  [place] of Information:  Facility Patient Interpreter Needed:  None Criminal Activity/Legal Involvement Pertinent to Current Situation/Hospitalization:  No - Comment as needed Significant Relationships:  Parents Lives with:  Facility Resident Do you feel safe going back to the place where you live?  Yes Need for family participation in patient care:  Yes (Comment)  Care giving concerns:  Patient is long-term resident of [facility] [place] Worker assessment / plan:  Patient is a long term care resident of [facility].Facility notified of patient's location and has been able to meet patient and father at bedside. Pt's father is HCPOA. Plan is for patient to return to [facility] once medically stable. Continued medical work up pending. CSW will continue to follow pt and pt's family for continued support and to facilitate pt's discharge needs once medically stable.   Employment status:  Disabled (Comment on whether or not currently receiving Disability) Insurance information:  Medicare, Medicaid In [place] PT Recommendations:  Not assessed at this time Information / Referral to community resources:  [place]  Patient/Family's Response to care:  Pt disoriented. Pt's father very involved in pt's care and supportive at bedside.    Patient/Family's Understanding of and Emotional Response to Diagnosis, Current Treatment, and Prognosis:  Pt's father involved in continued medical treatment/ work up. Palliative Care consult pending. CSW remains available for emotional support.   Emotional Assessment Appearance:  Appears stated age Attitude/Demeanor/Rapport:  Unable to Assess Affect (typically observed):  Unable to Assess Orientation:  Oriented to Self Alcohol / Substance use:  Not Applicable Psych involvement (Current and /or in the community):  No (Comment)  Discharge Needs  Concerns to be addressed:  Basic Needs Readmission within the last 30 days:  No Current discharge risk:  Chronically ill Barriers to Discharge:  Continued Medical Work up   Rozell Searing, LCSW 01/24/2015, 12:08 PM

## 2015-01-25 LAB — GLUCOSE, CAPILLARY: GLUCOSE-CAPILLARY: 152 mg/dL — AB (ref 65–99)

## 2015-01-25 MED ORDER — LEVETIRACETAM 500 MG PO TABS
500.0000 mg | ORAL_TABLET | Freq: Two times a day (BID) | ORAL | Status: DC
  Administered 2015-01-25 – 2015-01-29 (×8): 500 mg via ORAL
  Filled 2015-01-25 (×8): qty 1

## 2015-01-25 MED ORDER — LACOSAMIDE 50 MG PO TABS
50.0000 mg | ORAL_TABLET | Freq: Two times a day (BID) | ORAL | Status: DC
  Administered 2015-01-25 – 2015-01-29 (×9): 50 mg via ORAL
  Filled 2015-01-25 (×9): qty 1

## 2015-01-25 MED ORDER — PNEUMOCOCCAL VAC POLYVALENT 25 MCG/0.5ML IJ INJ
0.5000 mL | INJECTION | INTRAMUSCULAR | Status: AC
  Administered 2015-01-26: 0.5 mL via INTRAMUSCULAR
  Filled 2015-01-25: qty 0.5

## 2015-01-25 NOTE — Progress Notes (Signed)
Patient ID: [name], female   DOB: May 02, 1961, 54 y.o.   MRN: [id number] [facility] PROGRESS NOTE  [name] FUX:[id number] DOB: [birth year] DOA: 01/22/2015 PCP: [name], DO  Brief narrative:    54 y.o. 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 Patient has had a prior admissions for altered mental status secondary to urinary tract infection.   On this admission, patient presented with altered mental status as well. MRI on this admission demonstrated slight interval increase in size of heterogeneously enhancing left occipital lobe lesion now measuring 19 x 16 x 15 mm with slightly increased vasogenic edema and given the relative increase in the size of this lesion this is felt to be most consistent with tumor and its progression.  Neurosurgery has seen the patient in consultation and does not feel that patient is a candidate for surgical intervention. Per Dr. [name] of radiation oncology the only potential treatment options such as surgery, chemo or radiotherapy are likely to introduce risk of harm without necessarily providing any palliative benefit. I spoke with the patient's father and he is very understanding and was agreeable to palliative care approach.  Anticipated discharge: likely by Monday 01/29/15 to SNF. We will continue IV decadron and we will continue to monitor her mental status in hopes it improves over next few days.    Assessment/Plan:    Principal problem: Acute encephalopathy secondary to progression of brain tumor / right parietal astrocytoma / left occipital lobe lesion with increased vasogenic edema / seizures  - Patient was previously treated for anaplastic astrocytoma about 4 years ago with good local control but unfortunately suffered a  debilitating stroke thereafter. Her baseline performance status have declined since. She has had previous presentations with altered mental status which were thought to be secondary to UTI. Transiently mental status would improve with treatment for UTI. - Patient has seen Dr. [name] of radiation oncology, last appointment 12/15/2014 at which time patient and family decided that they will have follow-up MRI. She did have MRI in June 2016 which showed new left-sided 11 mm enhancing lesion suggestive of malignancy. - MRI on this admission demonstrated interval increase in the size of left occipital lobe lesion with increased vasogenic edema. Per neurosurgery, patient is a surgical candidate. Radiation oncology also feels that chemo, radiation may offer risk of harm rather than benefit  - Palliative consulted for goals of care - Continue decadron - Continue to monitor mental status  - Continue Keppra and Vimpat  - Return to SNF once more stable and hopefully if mental status improves.   Active problems: Hypernatremia - Likely from dehydration secondary to poor mental status - Check BMP tomorrow am  Hypokalemia - Likely from poor by mouth intake - Supplemented - Follow-up BMP tomorrow   Leukopenia - Secondary to history of malignancy. No neutropenia. - Follow up CBC tomorrow am   Anemia of chronic disease / thrombocytopenia - Secondary to history of malignancy - Hemoglobin stable  Chronic kidney disease, stage III - Baseline creatinine 1.18 and slightly above the baseline range, 1.30 on this admission. Likely prerenal from poor by mouth intake, dehydration - Creatinine improving with IV fluids   Depression - Continue Abilify and Prozac - Stable    DVT Prophylaxis  - SCDs bilaterally    Code Status: Full.  Family Communication: Spoke  with patient's father over the phone 01/25/2015  Disposition Plan: to SNF once her mental status hopefully better   IV access:  Peripheral  IV  Procedures and diagnostic studies:    Dg Chest 1 View 01/22/2015 No active disease.  Chronic elevation of the right hemidiaphragm.     Dg Lumbar Spine 2-3 Views 01/23/2015  Markedly limited exam due to patient rotation on AP view with leftward curvature of the lumbar spine.  Lateral view demonstrates preservation of the vertebral body and intervertebral disc space heights.  Gaseous distention of small and large bowel with large amount of stool within the colon and rectum as can be seen with constipation and/or impaction.    Ct Head Wo Contrast 01/22/2015  1. Chronic and post treatment changes to the brain. Increased hypodensity in the left occipital lobe since June suggesting new or progressed cerebral edema at that site. 2. No acute intracranial hemorrhage or new cortically based infarct identified.     Mr [name] Contrast 01/22/2015    1. Slight interval increase in size of heterogeneously enhancing left occipital lobe lesion now measuring 19 x 16 x 15 mm with slightly increased vasogenic edema. Given the relative increase in size of this lesion, this is felt to be most consistent with tumor given its progression relative to previous MRI from 11/06/2014. 2. 10 mm right periventricular enhancing nodule, not significantly changed relative to previous MRI from 12/08/2014, but increased in size relative to 11/06/2014. No associated edema. 3. Stable postoperative changes from previous right parietal lobe tumor resection. 4. Remote right MCA territory infarct surrounding the resection cavity, stable.    Medical Consultants:  Neurology Neurosurgery Radiation oncology   Other Consultants:  SLP Physical therapy  IAnti-Infectives:   None    Leisa Lenz, MD  [facility] Pager [phone]  Time spent in minutes: 25 minutes  If 7PM-7AM, please contact night-coverage [email or web address] Password [email or web address] 01/25/2015, 6:00 PM   LOS: 3 days    HPI/Subjective: No acute overnight events. Patient  somewhat lethargic.   Objective: Filed Vitals:   01/25/15 0424 01/25/15 0958 01/25/15 1300 01/25/15 1649  BP: 139/65 111/66 126/46 122/60  Pulse: 65 84 76 76  Temp: 99.3 F (37.4 C) 99.8 F (37.7 C) 99.3 F (37.4 C) 98.7 F (37.1 C)  TempSrc: Oral Axillary Axillary Oral  Resp: 18 16 15 18   Height:      SpO2: 94% 97% 96% 97%   No intake or output data in the 24 hours ending 01/25/15 1800  Exam:   General:  Pt is oriented to self only, no distress   Cardiovascular: Regular rate and rhythm, S1/S2 (+)  Respiratory: No wheezing, no crackles, no rhonchi  Abdomen: non tender, non distended, (+) BS  Extremities: No leg swelling, palpable pulses   Neuro: Nonfocal  Data Reviewed: Basic Metabolic Panel:  Recent Labs Lab 01/22/15 1500 01/22/15 2300 01/23/15 0626  NA 152*  --  150*  K 3.4*  --  3.2*  CL 119*  --  121*  CO2 21*  --  21*  GLUCOSE 98  --  144*  BUN 19  --  20  CREATININE 1.30* 1.27* 1.27*  CALCIUM 10.5*  --  10.1  MG  --  1.8  --   PHOS  --  3.2  --    Liver Function Tests:  Recent Labs Lab 01/22/15 1500  AST 42*  ALT 27  ALKPHOS 77  BILITOT 0.8  PROT 6.3*  ALBUMIN 3.6  No results for input(s): LIPASE, AMYLASE in the last 168 hours.  Recent Labs Lab 01/23/15 1720  AMMONIA 46*   CBC:  Recent Labs Lab 01/22/15 1500 01/22/15 2300 01/23/15 0626  WBC 5.5 3.7* 3.0*  NEUTROABS 3.0  --   --   HGB 11.0* 10.5* 10.0*  HCT 33.9* 33.0* 31.9*  MCV 82.3 83.3 83.5  PLT 120* 104* 117*   Cardiac Enzymes:  Recent Labs Lab 01/22/15 1500  TROPONINI <0.03   BNP: Invalid input(s): POCBNP CBG:  Recent Labs Lab 01/24/15 2002 01/24/15 2319 01/25/15 0422  GLUCAP 129* 153* 152*    No results found for this or any previous visit (from the past 240 hour(s)).   Scheduled Meds: . ARIPiprazole  7 mg Oral Daily  . baclofen  10 mg Oral BID  . dexamethasone  4 mg Intravenous 4 times per day  . feeding supplement (ENSURE ENLIVE)  237 mL Oral  BID BM  . FLUoxetine  60 mg Oral Daily  . heparin  5,000 Units Subcutaneous 3 times per day  . lacosamide  50 mg Oral BID  . lamoTRIgine  100 mg Oral Daily  . levETIRAcetam  500 mg Oral Q12H  . [START ON 01/26/2015] pneumococcal 23 valent vaccine  0.5 mL Intramuscular Tomorrow-1000  . sodium chloride  3 mL Intravenous Q12H   Continuous Infusions: . dextrose 5 % and 0.45 % NaCl with KCl 20 mEq/L 75 mL/hr at 01/24/15 [phone]

## 2015-01-25 NOTE — Evaluation (Signed)
Physical Therapy Evaluation Patient Details Name: [name] MRN: [id number] DOB: Jun 05, 1960 Today's Date: 01/25/2015   History of Present Illness  54 year old female admitted 01/22/15 due to AMS. PMH significant for GERD, right parietal tumor (2012), SAH (2014), SDH, R MCA CVA, dysphagia. MRI revealed Left occipital lesion (Acute encephalopathy secondary to progression of brain tumor / right parietal astrocytoma / left occipital lobe lesion with increased vasogenic edema / seizures).  Clinical Impression  Patient demonstrates deficits in functional mobility as indicated below. Per family at bedside patient requires assist for all activity at SNF. At this time, feel patient would benefit from return to SNF with assist. No further acute PT needs, recommend palliative consult if family would desire.    Follow Up Recommendations SNF (return to SNF for custodial care)    Equipment Recommendations  None recommended by PT    Recommendations for Other Services  (palliative consult)     Precautions / Restrictions Precautions Precautions: Fall Restrictions Weight Bearing Restrictions: No      Mobility  Bed Mobility Overal bed mobility: Needs Assistance;+2 for physical assistance;+ 2 for safety/equipment Bed Mobility: Rolling;Sidelying to Sit;Sit to Sidelying Rolling: Mod assist Sidelying to sit: Max assist;+2 for physical assistance     Sit to sidelying: Max assist;+2 for physical assistance General bed mobility comments: patient able to use RUE to reach and pull across to rail for initiation of rolling, increased assist to reach side, max assist of 2 to elevate trunk to upright  Transfers                    Ambulation/Gait                Stairs            Wheelchair Mobility    Modified Rankin (Stroke Patients Only)       Balance Overall balance assessment: Needs assistance Sitting-balance support: Bilateral upper extremity supported;Feet  supported Sitting balance-Leahy Scale: Poor Sitting balance - Comments: patient unable to maintain static sitting without physical assist Postural control: Posterior lean;Left lateral lean                                   Pertinent Vitals/Pain Pain Assessment: Faces Faces Pain Scale: Hurts even more Pain Location: back and legs withmovement Pain Intervention(s): Limited activity within patient's tolerance;Monitored during session;Repositioned    Home Living Family/patient expects to be discharged to:: Skilled nursing facility                      Prior Function Level of Independence: Needs assistance         Comments: per family at bedside, patient typically can tolerate transfer to w/c on good days but requires assist for all aspects of care at nursing home     Hand Dominance   Dominant Hand: Right    Extremity/Trunk Assessment   Upper Extremity Assessment: Defer to OT evaluation           Lower Extremity Assessment: Generalized weakness;RLE deficits/detail;LLE deficits/detail RLE Deficits / Details: LE strength with limited ROM both active and passisve, strength 3/5 with extension resting position LLE Deficits / Details: limited AROM, with increased tone with adduction and knee flexion      Communication   Communication: Expressive difficulties;Receptive difficulties  Cognition Arousal/Alertness: Awake/alert Behavior During Therapy: Flat affect Overall Cognitive Status: Difficult to assess  General Comments      Exercises        Assessment/Plan    PT Assessment Patent does not need any further PT services  PT Diagnosis Generalized weakness;Acute pain;Altered mental status   PT Problem List    PT Treatment Interventions     PT Goals (Current goals can be found in the Care Plan section) Acute Rehab PT Goals PT Goal Formulation: All assessment and education complete, DC therapy    Frequency      Barriers to discharge        Co-evaluation               End of Session   Activity Tolerance: Patient limited by fatigue;Patient limited by pain Patient left: in bed;with call bell/phone within reach;with bed alarm set;with family/visitor present Nurse Communication: Mobility status         Time: 2831-5176 PT Time Calculation (min) (ACUTE ONLY): 14 min   Charges:   PT Evaluation $Initial PT Evaluation Tier I: 1 Procedure     PT G CodesDuncan Dull 02-06-15, 1:57 PM Alben Deeds, [place] DPT  [phone]

## 2015-01-25 NOTE — Care Management Important Message (Signed)
Important Message  Patient Details  Name: [name] MRN: [id number] Date of Birth: 09/15/1960   Medicare Important Message Given:  Yes-second notification given    [name] 01/25/2015, 11:55 AM

## 2015-01-25 NOTE — Progress Notes (Signed)
Speech Language Pathology Treatment: Dysphagia  Patient Details Name: [name] MRN: [id number] DOB: 12/15/1960 Today's Date: 01/25/2015 Time: 5027-1423 SLP Time Calculation (min) (ACUTE ONLY): 15 min  Assessment / Plan / Recommendation Clinical Impression  Pt seen to assess tolerance of po diet and indication for modification.  NT feeding pt upon SLP entrance to room.  Slow mastication noted with delayed swallow and mild left lateral sulci residuals.  Liquid wash or applesauce facilitates clearance.  No s/s of aspiration noted.  Pt will be chronic aspiration risk due to her reliance on others for feeding, however appears to be tolerating well currently.  Pt agreeable to continue Dys3/thin diet and anticipate this to be a chronic diet need for pt.    SLP to sign off as all education completed and pt managing po intake well.    HPI Other Pertinent Information: 54 year old female admitted 01/22/15 due to AMS. PMH significant for GERD, right parietal tumor (2012), SAH (2014), SDH, R MCA CVA, dysphagia. MRI revealed Left occipital lesion.  Pt for swallow evaluation, yesterday was not ready for po intake due to AMS.  Pt today seen to assess po readiness.     Pertinent Vitals Pain Assessment: Faces Pain Score: 2  Faces Pain Scale: Hurts a little bit Pain Location: back Pain Descriptors / Indicators: Aching Pain Intervention(s): Limited activity within patient's tolerance;Monitored during session;Other (comment) (pt had recently been given tylenol by RN per NT and pt reports improved comfort)  SLP Plan  All goals met    Recommendations Diet recommendations: Dysphagia 3 (mechanical soft);Thin liquid Liquids provided via: Straw Medication Administration: Whole meds with puree Supervision: Full supervision/cueing for compensatory strategies Compensations: Slow rate;Check for pocketing;Small sips/bites (use applesauce to help clear oral residuals) Postural Changes and/or Swallow Maneuvers:  Seated upright 90 degrees;Upright 30-60 min after meal              Follow up Recommendations: None Plan: All goals met    [place], [place] SLP [phone]

## 2015-01-26 DIAGNOSIS — Z515 Encounter for palliative care: Secondary | ICD-10-CM

## 2015-01-26 DIAGNOSIS — C714 Malignant neoplasm of occipital lobe: Secondary | ICD-10-CM | POA: Diagnosis not present

## 2015-01-26 DIAGNOSIS — F329 Major depressive disorder, single episode, unspecified: Secondary | ICD-10-CM

## 2015-01-26 DIAGNOSIS — I6330 Cerebral infarction due to thrombosis of unspecified cerebral artery: Secondary | ICD-10-CM | POA: Insufficient documentation

## 2015-01-26 LAB — CBC
HEMATOCRIT: 31.4 % — AB (ref 36.0–46.0)
HEMOGLOBIN: 9.8 g/dL — AB (ref 12.0–15.0)
MCH: 25.9 pg — AB (ref 26.0–34.0)
MCHC: 31.2 g/dL (ref 30.0–36.0)
MCV: 83.1 fL (ref 78.0–100.0)
Platelets: 80 10*3/uL — ABNORMAL LOW (ref 150–400)
RBC: 3.78 MIL/uL — ABNORMAL LOW (ref 3.87–5.11)
RDW: 16.1 % — ABNORMAL HIGH (ref 11.5–15.5)
WBC: 3.6 10*3/uL — ABNORMAL LOW (ref 4.0–10.5)

## 2015-01-26 LAB — BASIC METABOLIC PANEL
ANION GAP: 6 (ref 5–15)
BUN: 30 mg/dL — AB (ref 6–20)
CO2: 20 mmol/L — AB (ref 22–32)
Calcium: 9.6 mg/dL (ref 8.9–10.3)
Chloride: 115 mmol/L — ABNORMAL HIGH (ref 101–111)
Creatinine, Ser: 1.3 mg/dL — ABNORMAL HIGH (ref 0.44–1.00)
GFR calc Af Amer: 52 mL/min — ABNORMAL LOW (ref >60)
GFR calc non Af Amer: 45 mL/min — ABNORMAL LOW (ref >60)
GLUCOSE: 182 mg/dL — AB (ref 65–99)
POTASSIUM: 3.9 mmol/L (ref 3.5–5.1)
Sodium: 141 mmol/L (ref 135–145)

## 2015-01-26 NOTE — Progress Notes (Addendum)
Patient ID: [name], female   DOB: [birth year], 54 y.o.   MRN: [id number] [facility] PROGRESS NOTE  [name] [id number]:[id number] DOB: July 22, 1960 DOA: 01/22/2015 PCP: [name], DO  Brief narrative:    54 y.o. 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 Patient has had a prior admissions for altered mental status secondary to urinary tract infection.   On this admission, patient presented with altered mental status as well. MRI on this admission demonstrated slight interval increase in size of heterogeneously enhancing left occipital lobe lesion now measuring 19 x 16 x 15 mm with slightly increased vasogenic edema and given the relative increase in the size of this lesion this is felt to be most consistent with tumor and its progression.  Neurosurgery has seen the patient in consultation and does not feel that patient is a candidate for surgical intervention. Per Dr. [name] of radiation oncology the only potential treatment options such as surgery, chemo or radiotherapy are likely to introduce risk of harm without necessarily providing any palliative benefit. I spoke with the patient's father and he is very understanding and was agreeable to palliative care approach.  Anticipated discharge: likely by Monday 01/29/15 to SNF. Continue IV decadron.    Assessment/Plan:    Principal problem: Acute encephalopathy secondary to progression of brain tumor / right parietal astrocytoma / left occipital lobe lesion with increased vasogenic edema / seizures  - Patient was previously treated for anaplastic astrocytoma about 4 years ago with good local control but unfortunately suffered a debilitating stroke thereafter. Her baseline performance status have declined since. She has had previous  presentations with altered mental status which were thought to be secondary to UTI. Transiently mental status would improve with treatment for UTI. - Patient has seen Dr. [name] of radiation oncology, last appointment 12/15/2014 at which time patient and family decided that they will have follow-up MRI. She did have MRI in June 2016 which showed new left-sided 11 mm enhancing lesion suggestive of malignancy. - MRI on this admission demonstrated interval increase in the size of left occipital lobe lesion with increased vasogenic edema. Per neurosurgery, patient is a surgical candidate. Radiation oncology also feels that chemo, radiation may offer risk of harm rather than benefit  - No significant changes in mental status since the admission - No reports of seizures. We will continue Keppra, Lamictal and Vimpat. - Palliative care consulted for goals of care. - Continue decadron - Return to SNF once more stable  Active problems: Hypernatremia - Likely from dehydration secondary to poor mental status - Sodium normalized with IV fluids.  Hypokalemia - Likely from poor by mouth intake - Supplemented and within normal limits.  Leukopenia - Secondary to history of malignancy. No neutropenia. - White blood cell count is stable at 3.6. No fevers.  Anemia of chronic disease / thrombocytopenia - Secondary to history of malignancy - Hemoglobin stable at 9.8 - Platelet count 80 this morning - No current indications for transfusion  Chronic kidney disease, stage III - Baseline creatinine 1.18 and slightly above the baseline range, 1.30 on this admission. Likely prerenal from poor by mouth intake, dehydration - Creatinine stable at 1.3.  Depression - Continue Abilify and Prozac - Stable   Cerebral thrombosis with cerebral infarction - History of CVA. Stable.  Adult failure to thrive / severe  protein calorie malnutrition - In the context of chronic illness, malignancy, poor mental status -  Continue nutritional supplementation   DVT Prophylaxis  - SCDs bilaterally    Code Status: Full.  Family Communication: Spoke with patient's father over the phone 01/25/2015. He has my cell phone number to call me with any questions or concerns. I spoke with him about discharge plan to [facility] hopefully by Monday, 01/29/2015. Disposition Plan: to SNF once her mental status hopefully better   IV access:  Peripheral IV  Procedures and diagnostic studies:    Dg Chest 1 View 01/22/2015 No active disease.  Chronic elevation of the right hemidiaphragm.     Dg Lumbar Spine 2-3 Views 01/23/2015  Markedly limited exam due to patient rotation on AP view with leftward curvature of the lumbar spine.  Lateral view demonstrates preservation of the vertebral body and intervertebral disc space heights.  Gaseous distention of small and large bowel with large amount of stool within the colon and rectum as can be seen with constipation and/or impaction.    Ct Head Wo Contrast 01/22/2015  1. Chronic and post treatment changes to the brain. Increased hypodensity in the left occipital lobe since June suggesting new or progressed cerebral edema at that site. 2. No acute intracranial hemorrhage or new cortically based infarct identified.     Mr [name] Contrast 01/22/2015    1. Slight interval increase in size of heterogeneously enhancing left occipital lobe lesion now measuring 19 x 16 x 15 mm with slightly increased vasogenic edema. Given the relative increase in size of this lesion, this is felt to be most consistent with tumor given its progression relative to previous MRI from 11/06/2014. 2. 10 mm right periventricular enhancing nodule, not significantly changed relative to previous MRI from 12/08/2014, but increased in size relative to 11/06/2014. No associated edema. 3. Stable postoperative changes from previous right parietal lobe tumor resection. 4. Remote right MCA territory infarct surrounding the  resection cavity, stable.    Medical Consultants:  Neurology Neurosurgery Radiation oncology  Palliative care  Other Consultants:  SLP Physical therapy Nutrition  IAnti-Infectives:   None    Leisa Lenz, MD  [facility] Pager [phone]  Time spent in minutes: 25 minutes  If 7PM-7AM, please contact night-coverage [email or web address] Password [email or web address] 01/26/2015, 11:21 AM   LOS: 4 days    HPI/Subjective: No acute overnight events. Patient somewhat lethargic.   Objective: Filed Vitals:   01/25/15 2138 01/26/15 0108 01/26/15 0431 01/26/15 0943  BP: 126/64 122/58 122/65 103/58  Pulse: 63 62 83 69  Temp: 98.8 F (37.1 C) 98.1 F (36.7 C) 99 F (37.2 C) 98.8 F (37.1 C)  TempSrc: Oral Oral Oral Oral  Resp: 18 18 18 18   Height:      SpO2: 98% 97% 97% 98%   No intake or output data in the 24 hours ending 01/26/15 1121  Exam:   General:  Pt is not in acute distress  Cardiovascular: Rate controlled, appreciate S1-S2  Respiratory: Bilateral air entry, no wheezing  Abdomen: Nontender, nondistended abdomen, appreciate bowel sounds  Extremities: No lower extremity edema, pulses palpable  Neuro: No focal deficits.  Data Reviewed: Basic Metabolic Panel:  Recent Labs Lab 01/22/15 1500 01/22/15 2300 01/23/15 0626 01/26/15 0455  NA 152*  --  150* 141  K 3.4*  --  3.2* 3.9  CL 119*  --  121* 115*  CO2 21*  --  21* 20*  GLUCOSE 98  --  144*  182*  BUN 19  --  20 30*  CREATININE 1.30* 1.27* 1.27* 1.30*  CALCIUM 10.5*  --  10.1 9.6  MG  --  1.8  --   --   PHOS  --  3.2  --   --    Liver Function Tests:  Recent Labs Lab 01/22/15 1500  AST 42*  ALT 27  ALKPHOS 77  BILITOT 0.8  PROT 6.3*  ALBUMIN 3.6   No results for input(s): LIPASE, AMYLASE in the last 168 hours.  Recent Labs Lab 01/23/15 1720  AMMONIA 46*   CBC:  Recent Labs Lab 01/22/15 1500 01/22/15 2300 01/23/15 0626 01/26/15 0455  WBC 5.5 3.7* 3.0* 3.6*  NEUTROABS 3.0  --   --    --   HGB 11.0* 10.5* 10.0* 9.8*  HCT 33.9* 33.0* 31.9* 31.4*  MCV 82.3 83.3 83.5 83.1  PLT 120* 104* 117* 80*   Cardiac Enzymes:  Recent Labs Lab 01/22/15 1500  TROPONINI <0.03   BNP: Invalid input(s): POCBNP CBG:  Recent Labs Lab 01/24/15 2002 01/24/15 2319 01/25/15 0422  GLUCAP 129* 153* 152*    No results found for this or any previous visit (from the past 240 hour(s)).   Scheduled Meds: . ARIPiprazole  7 mg Oral Daily  . baclofen  10 mg Oral BID  . dexamethasone  4 mg Intravenous 4 times per day  . feeding supplement (ENSURE ENLIVE)  237 mL Oral BID BM  . FLUoxetine  60 mg Oral Daily  . heparin  5,000 Units Subcutaneous 3 times per day  . lacosamide  50 mg Oral BID  . lamoTRIgine  100 mg Oral Daily  . levETIRAcetam  500 mg Oral Q12H  . sodium chloride  3 mL Intravenous Q12H   Continuous Infusions: . dextrose 5 % and 0.45 % NaCl with KCl 20 mEq/L 75 mL/hr at 01/26/15 0543

## 2015-01-26 NOTE — Consult Note (Signed)
Consultation Note Date: 01/26/2015   Patient Name: [name]  DOB: 17-Mar-1961  MRN: [id number]  Age / Sex: 54 y.o., female   PCP: Carla Cranker, DO Referring Physician: Robbie Lis, MD  Reason for Consultation: Establishing goals of care  Palliative Care Assessment and Plan Summary of Established Goals of Care and Medical Treatment Preferences   Clinical Assessment/Narrative: Pt is a 54 yo female with h/o right parietal anaplastic astrocytoma who has undergone excision in 2012, later with [facility] in 2014 and later MCA CVA. She has been admitted with AMS in the recent past usually assoc with UTI. Pt was readmitted 01/22/15 with AMS. Per father, she had fallen out of WC 2 days prior. Repeat MRI shows growth in new area of tumor on left occipital lesion with increased vasogenic edema. Per MRI existing tumor also enlarged when compared to scan 11/06/14. Pt is alert today. She can interact with you in terms of answering simple questions. She was able to introduce her father to me. She is dependant in all of her ADL's including feeding which places her at increased risk for aspiration pna. Pt is married with one 24 yo son. Per father, he and pt's husband share HCPOA decisions. Her son is in jail. For part of our conversation [name] with [facility] was present. She is going to evaluate whether pt has a skilled need when returning to the facility  Contacts/Participants in Discussion: Primary Decision Maker: Husband [name] at [phone]; father [name] at [phone] HCPOA: no  Pt, and pt's father. Spoke to husband on the phone  Code Status/Advance Care Planning:  DNR  Father and husband would like to avoid hospitalization and elect hospice benefit after skilled days exhausted  Symptom Management: being managed by attending  Pt denies pain.  Seizure mgt with Vimpat and Keppra  Palliative Prophylaxis: senna, dulcolax  Additional Recommendations (Limitations,  Scope, Preferences):  Likely dc'd with skilled days but elect hospice benefit afterwards. Pt is seen by [name] with [facility] and would cont with her services in the interim Psycho-social/Spiritual:   Support System: father  Desire for further Chaplaincy support:no  Prognosis: < 6 months  Discharge Planning:  [name] for rehab with Palliative care service follow-up       Chief Complaint/History of Present Illness: Pt is a 54 yo female with a h/o astrocytoma on the right now with new enhancing lesion on the left occipital lobe. She was admitted with altered mental status and per MRI found to have worsening neurological sstatus secondary to worsening brain tumor as well as on-going debiity  Primary Diagnoses  Present on Admission:  . Metabolic encephalopathy . Astrocytoma brain tumor . Acute encephalopathy . Depression  Palliative Review of Systems: Pt denies pain and dyspnea. Can answer on simple brief questions I have reviewed the medical record, interviewed the patient and family, and examined the patient. The following aspects are pertinent.  Past Medical History  Diagnosis Date  . 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 Fracture, ankle 2012    right  . History of radiation therapy 09/23/10- 11/07/10    right parietal lobe  . Chronic headaches     uses oxycodone appropriately  . Anxiety   . Muscle spasticity   . Seizures   . Dysphagia   . GERD (gastroesophageal reflux disease)   . Nontraumatic subarachnoid hemorrhage   . Pure hypercholesterolemia   . Mild mood  disorder   . Atypical psychosis   . Epilepsy   . Problems with swallowing and mastication   . Depression    Social History   Social History  . Marital Status: Married    Spouse Name: N/A  . Number of Children: 1  . Years of Education: N/A   Occupational History  .      unemployed   Social History Main Topics  . Smoking status: Never  Smoker   . Smokeless tobacco: Never Used  . Alcohol Use: 0.0 oz/week    3-4 Cans of beer per week  . Drug Use: No  . Sexual Activity: No   Other Topics Concern  . None   Social History Narrative   Family History  Problem Relation Age of Onset  . Breast cancer Mother    Scheduled Meds: . ARIPiprazole  7 mg Oral Daily  . baclofen  10 mg Oral BID  . dexamethasone  4 mg Intravenous 4 times per day  . feeding supplement (ENSURE ENLIVE)  237 mL Oral BID BM  . FLUoxetine  60 mg Oral Daily  . heparin  5,000 Units Subcutaneous 3 times per day  . lacosamide  50 mg Oral BID  . lamoTRIgine  100 mg Oral Daily  . levETIRAcetam  500 mg Oral Q12H  . sodium chloride  3 mL Intravenous Q12H   Continuous Infusions: . dextrose 5 % and 0.45 % NaCl with KCl 20 mEq/L 75 mL/hr at 01/26/15 0543   PRN Meds:.acetaminophen, albuterol Medications Prior to Admission:  Prior to Admission medications   Medication Sig Start Date End Date Taking? Authorizing Provider  acetaminophen (TYLENOL) 325 MG tablet Take 650 mg by mouth 3 (three) times daily. For pain (6am, 2pm, 10pm)   Yes Historical Provider, MD  ARIPiprazole (ABILIFY) 2 MG tablet Take 2 mg by mouth daily. Take with a 5 mg tablet for a 7 mg dose every morning at 9am   Yes Historical Provider, MD  ARIPiprazole (ABILIFY) 5 MG tablet Take 5 mg by mouth daily. For psychosis: Take with a 2 mg tablet for a 7 mg dose every morning at 9am   Yes Historical Provider, MD  aspirin 325 MG EC tablet Take 325 mg by mouth at bedtime. For cerebral aneurysm   Yes Historical Provider, MD  atorvastatin (LIPITOR) 10 MG tablet Take 10 mg by mouth at bedtime. For lipoproteins   Yes Historical Provider, MD  baclofen (LIORESAL) 10 MG tablet Take 1 tablet (10 mg total) by mouth 3 (three) times daily as needed for muscle spasms. Patient taking differently: Take 10 mg by mouth 2 (two) times daily.  11/16/14  Yes Delfina Redwood, MD  bisacodyl (DULCOLAX) 10 MG suppository Place  1 suppository (10 mg total) rectally daily as needed for moderate constipation. 11/16/14  Yes Delfina Redwood, MD  FLUoxetine (PROZAC) 20 MG capsule Take 60 mg by mouth daily. 07/12/13  Yes Adeline Saralyn Pilar, MD  gabapentin (NEURONTIN) 300 MG capsule Take 1 capsule (300 mg total) by mouth at bedtime. 11/16/14  Yes Delfina Redwood, MD  lacosamide (VIMPAT) 50 MG TABS tablet Take one tablet by mouth twice daily Patient taking differently: Take 50 mg by mouth 2 (two) times daily.  11/17/14  Yes Tiffany L Reed, DO  lamoTRIgine (LAMICTAL) 100 MG tablet Take 1 tablet (100 mg total) by mouth daily. For mood disorder 11/16/14  Yes Delfina Redwood, MD  levETIRAcetam (KEPPRA) 500 MG tablet Take 1 tablet (500 mg total)  by mouth every 12 (twelve) hours. 01/25/13  Yes Chauncey Cruel, MD  Multiple Vitamin (MULTIVITAMIN WITH MINERALS) TABS tablet Take 1 tablet by mouth daily.   Yes Historical Provider, MD  omeprazole (PRILOSEC) 20 MG capsule Take 20 mg by mouth daily at 6 (six) AM.    Yes Historical Provider, MD  polyethylene glycol (MIRALAX / GLYCOLAX) packet Take 17 g by mouth daily. 11/16/14  Yes Delfina Redwood, MD  potassium chloride SA (K-DUR,KLOR-CON) 20 MEQ tablet Take 20 mEq by mouth daily.   Yes Historical Provider, MD  PRESCRIPTION MEDICATION Take 60 mLs by mouth 2 (two) times daily. 2 cal supplement (8am and 5pm)   Yes Historical Provider, MD   Allergies  Allergen Reactions  . Vasotec Other (See Comments)    Causes angioedema   CBC:    Component Value Date/Time   WBC 3.6* 01/26/2015 0455   WBC 3.3* 12/07/2012 1552   HGB 9.8* 01/26/2015 0455   HGB 11.9 12/07/2012 1552   HCT 31.4* 01/26/2015 0455   HCT 34.6* 12/07/2012 1552   PLT 80* 01/26/2015 0455   PLT 97* 12/07/2012 1552   MCV 83.1 01/26/2015 0455   MCV 92.9 12/07/2012 1552   NEUTROABS 3.0 01/22/2015 1500   NEUTROABS 2.1 12/07/2012 1552   LYMPHSABS 2.0 01/22/2015 1500   LYMPHSABS 0.7* 12/07/2012 1552   MONOABS 0.4 01/22/2015  1500   MONOABS 0.3 12/07/2012 1552   EOSABS 0.1 01/22/2015 1500   EOSABS 0.1 12/07/2012 1552   BASOSABS 0.0 01/22/2015 1500   BASOSABS 0.0 12/07/2012 1552   Comprehensive Metabolic Panel:    Component Value Date/Time   NA 141 01/26/2015 0455   NA 144 12/07/2012 1552   K 3.9 01/26/2015 0455   K 4.0 12/07/2012 1552   CL 115* 01/26/2015 0455   CL 105 08/31/2012 1523   CO2 20* 01/26/2015 0455   CO2 31* 12/07/2012 1552   BUN 30* 01/26/2015 0455   BUN 12.2 12/07/2012 1552   CREATININE 1.30* 01/26/2015 0455   CREATININE 1.0 12/07/2012 1552   GLUCOSE 182* 01/26/2015 0455   GLUCOSE 112 12/07/2012 1552   GLUCOSE 97 08/31/2012 1523   CALCIUM 9.6 01/26/2015 0455   CALCIUM 10.4 12/07/2012 1552   AST 42* 01/22/2015 1500   AST 56* 12/07/2012 1552   ALT 27 01/22/2015 1500   ALT 27 12/07/2012 1552   ALKPHOS 77 01/22/2015 1500   ALKPHOS 127 12/07/2012 1552   BILITOT 0.8 01/22/2015 1500   BILITOT 1.08 12/07/2012 1552   PROT 6.3* 01/22/2015 1500   PROT 6.5 12/07/2012 1552   ALBUMIN 3.6 01/22/2015 1500   ALBUMIN 3.2* 12/07/2012 1552    Physical Exam: Vital Signs: BP 103/58 mmHg  Pulse 69  Temp(Src) 98.8 F (37.1 C) (Oral)  Resp 18  Ht 5\' 9"  (1.753 m)  SpO2 98%  LMP 08/26/2010 SpO2: SpO2: 98 % O2 Device: O2 Device: Not Delivered O2 Flow Rate:   Intake/output summary:  Intake/Output Summary (Last 24 hours) at 01/26/15 1313 Last data filed at 01/26/15 1227  Gross per 24 hour  Intake    360 ml  Output      0 ml  Net    360 ml   LBM: Last BM Date: 01/25/15 Baseline Weight:   Most recent weight:    Exam Findings:  General: Well nourished middle aged female. No acute distress Resp: No work of breathing Neuro: Pt positioned to the right. Right gaze preference         Palliative Performance  Scale: 30%              Additional Data Reviewed: Recent Labs     01/26/15  0455  WBC  3.6*  HGB  9.8*  PLT  80*  NA  141  BUN  30*  CREATININE  1.30*     Time In: 1000 Time  Out: 1130 Time Total: 90 min Greater than 50%  of this time was spent counseling and coordinating care related to the above assessment and plan. Staffed with Dr. Charlies Silvers  Signed by: Dory Horn, NP  Dory Horn, NP  01/26/2015, 1:13 PM  Please contact Palliative Medicine Team phone at [phone] for questions and concerns.

## 2015-01-26 NOTE — Evaluation (Signed)
Occupational Therapy Evaluation Patient Details Name: [name] MRN: [id number] DOB: [birth year] Today's Date: 01/26/2015    History of Present Illness 54 year old female admitted 01/22/15 due to AMS. PMH significant for GERD, right parietal tumor (2012), SAH (2014), SDH, R MCA CVA, dysphagia. MRI revealed Left occipital lesion (Acute encephalopathy secondary to progression of brain tumor / right parietal astrocytoma / left occipital lobe lesion with increased vasogenic edema / seizures).   Clinical Impression   Pt admitted with above, demonstrates deficits in self-care tasks and functional mobility.  Per family at bedside, patient requires assist for all activity at SNF.  Pt +2 with bathing at bed level requiring increased time for initiation and pt unable to fully complete tasks even when attempting to participate.  Encouraged head turning and scanning to Lt visual field.  Pt with contracted LUE and hand.  At this time, feel patient would benefit from return to SNF.  No further OT needs at this time.    Follow Up Recommendations  SNF    Equipment Recommendations  None recommended by OT    Recommendations for Other Services       Precautions / Restrictions Precautions Precautions: Fall Restrictions Weight Bearing Restrictions: No      Mobility Bed Mobility Overal bed mobility: Needs Assistance;+2 for physical assistance;+ 2 for safety/equipment Bed Mobility: Rolling Rolling: Mod assist         General bed mobility comments: patient able to use RUE to reach and pull across to rail for initiation of rolling, increased assist to reach side, +2 to complete bathing in sidelying at bed level            ADL Overall ADL's : Needs assistance/impaired     Grooming: Bed level;Wash/dry face;Set up Grooming Details (indicate cue type and reason): attempted to brush hair but pt with decreased attention to task Upper Body Bathing: Total assistance;Bed level   Lower Body  Bathing: +2 for physical assistance;Bed level   Upper Body Dressing : Total assistance;Bed level Upper Body Dressing Details (indicate cue type and reason): donned gown                   General ADL Comments: Pt with decreased initiation and attention to task, requiring increased time to initiate any movement and +2 for safety with bed mobility and bathing at bed level     Vision Vision Assessment?: Vision impaired- to be further tested in functional context Additional Comments: pt with decreased scanning to Lt          Pertinent Vitals/Pain Pain Assessment: Faces Faces Pain Scale: Hurts little more Pain Location: LUE and legs with movement Pain Intervention(s): Limited activity within patient's tolerance;Monitored during session;Repositioned     Hand Dominance Right   Extremity/Trunk Assessment Upper Extremity Assessment Upper Extremity Assessment: LUE deficits/detail LUE Deficits / Details: contracted with tightly closed fist LUE Sensation: decreased proprioception LUE Coordination: decreased fine motor;decreased gross motor           Communication Communication Communication: Expressive difficulties;Receptive difficulties   Cognition Arousal/Alertness: Awake/alert Behavior During Therapy: Flat affect Overall Cognitive Status: Difficult to assess                                Home Living Family/patient expects to be discharged to:: Skilled nursing facility  Prior Functioning/Environment Level of Independence: Needs assistance        Comments: per father at bedside, patient typically can tolerate transfer to w/c on good days but requires assist for all aspects of care at nursing home    OT Diagnosis: Generalized weakness;Cognitive deficits;Disturbance of vision;Hemiplegia non-dominant side;Blindness and low vision;Apraxia   OT Problem List: Decreased strength;Decreased range of  motion;Decreased activity tolerance;Impaired balance (sitting and/or standing);Impaired vision/perception;Decreased coordination;Decreased cognition;Impaired tone;Impaired UE functional use;Pain   OT Treatment/Interventions:      OT Goals(Current goals can be found in the care plan section) Acute Rehab OT Goals Patient Stated Goal: no goal stated OT Goal Formulation: All assessment and education complete, DC therapy   End of Session Nurse Communication: Mobility status  Activity Tolerance: Patient tolerated treatment well;Patient limited by lethargy Patient left: in bed;with family/visitor present   Time: 0141-0301 OT Time Calculation (min): 20 min Charges:  OT General Charges $OT Visit: 1 Procedure G-CodesSimonne Come, [id number] 01/26/2015, 12:12 PM

## 2015-01-27 LAB — CBC
HCT: 32.3 % — ABNORMAL LOW (ref 36.0–46.0)
Hemoglobin: 10.5 g/dL — ABNORMAL LOW (ref 12.0–15.0)
MCH: 26.7 pg (ref 26.0–34.0)
MCHC: 32.5 g/dL (ref 30.0–36.0)
MCV: 82.2 fL (ref 78.0–100.0)
PLATELETS: 92 10*3/uL — AB (ref 150–400)
RBC: 3.93 MIL/uL (ref 3.87–5.11)
RDW: 15.6 % — AB (ref 11.5–15.5)
WBC: 4.2 10*3/uL (ref 4.0–10.5)

## 2015-01-27 LAB — BASIC METABOLIC PANEL
Anion gap: 8 (ref 5–15)
BUN: 28 mg/dL — AB (ref 6–20)
CHLORIDE: 107 mmol/L (ref 101–111)
CO2: 21 mmol/L — ABNORMAL LOW (ref 22–32)
CREATININE: 1.34 mg/dL — AB (ref 0.44–1.00)
Calcium: 9.4 mg/dL (ref 8.9–10.3)
GFR calc Af Amer: 50 mL/min — ABNORMAL LOW (ref >60)
GFR calc non Af Amer: 43 mL/min — ABNORMAL LOW (ref >60)
GLUCOSE: 147 mg/dL — AB (ref 65–99)
Potassium: 3.5 mmol/L (ref 3.5–5.1)
SODIUM: 136 mmol/L (ref 135–145)

## 2015-01-27 MED ORDER — DEXAMETHASONE SODIUM PHOSPHATE 4 MG/ML IJ SOLN
2.0000 mg | Freq: Four times a day (QID) | INTRAMUSCULAR | Status: DC
  Administered 2015-01-27 – 2015-01-28 (×4): 2 mg via INTRAVENOUS
  Filled 2015-01-27 (×4): qty 1

## 2015-01-27 NOTE — Progress Notes (Signed)
Patient ID: [name], female   DOB: 1960-07-23, 54 y.o.   MRN: [id number] [facility] PROGRESS NOTE  [name] [id number]:[id number] DOB: 24-Aug-1960 DOA: 01/22/2015 PCP: [name], DO  Brief narrative:    54 y.o. 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 Patient has had a prior admissions for altered mental status secondary to urinary tract infection.   On this admission, patient presented with altered mental status as well. MRI on this admission demonstrated slight interval increase in size of heterogeneously enhancing left occipital lobe lesion now measuring 19 x 16 x 15 mm with slightly increased vasogenic edema and given the relative increase in the size of this lesion this is felt to be most consistent with tumor and its progression.  Neurosurgery has seen the patient in consultation and does not feel that patient is a candidate for surgical intervention. Per Dr. [name] of radiation oncology the only potential treatment options such as surgery, chemo or radiotherapy are likely to introduce risk of harm without necessarily providing any palliative benefit. I spoke with the patient's father and he is very understanding and was agreeable to palliative care approach.  Anticipated discharge: likely by Monday 01/29/15 to SNF.    Assessment/Plan:    Principal problem: Acute encephalopathy secondary to progression of brain tumor / right parietal astrocytoma / left occipital lobe lesion with increased vasogenic edema / seizures  - Patient was previously treated for anaplastic astrocytoma about 4 years ago with good local control but unfortunately suffered a debilitating stroke thereafter. Her baseline performance status have declined since. She has had previous presentations with  altered mental status which were thought to be secondary to UTI. Transiently mental status would improve with treatment for UTI. - Patient has seen Dr. [name] of radiation oncology, last appointment 12/15/2014 at which time patient and family decided that they will have follow-up MRI. She did have MRI in June 2016 which showed new left-sided 11 mm enhancing lesion suggestive of malignancy. - MRI on this admission demonstrated interval increase in the size of left occipital lobe lesion with increased vasogenic edema. Per neurosurgery, patient is not a  surgical candidate. Radiation oncology also feels that chemo, radiation may offer risk of harm rather than benefit  - No significant changes in mental status since the admission - No reports of seizures. We will continue Keppra, Lamictal and Vimpat. - Palliative care consulted for goals of care. - wean decadron - Return to SNF once more stable  Hypernatremia - Likely from dehydration secondary to poor mental status - Sodium normalized with IV fluids.  Hypokalemia - Likely from poor by mouth intake - Supplemented and within normal limits.  Leukopenia - Secondary to history of malignancy. No neutropenia. -  No fevers.  Anemia of chronic disease / thrombocytopenia - Secondary to history of malignancy - Hemoglobin stable at 9.8 - Platelet count 80 this morning - No current indications for transfusion  Chronic kidney disease, stage III - Baseline creatinine 1.18 and slightly above the baseline range, 1.30 on this admission. Likely prerenal from poor by mouth intake, dehydration - Creatinine stable at 1.3.  Depression - Continue Abilify and Prozac - Stable   Cerebral thrombosis with cerebral infarction - History of CVA. Stable.  Adult failure to thrive / severe protein calorie malnutrition - In the context of chronic illness,  malignancy, poor mental status - Continue nutritional supplementation   DVT Prophylaxis  - SCDs  bilaterally    Code Status: Full.  Family Communication: no family at bedside Disposition Plan: to SNF Monday?  IV access:  Peripheral IV  Procedures and diagnostic studies:    Dg Chest 1 View 01/22/2015 No active disease.  Chronic elevation of the right hemidiaphragm.     Dg Lumbar Spine 2-3 Views 01/23/2015  Markedly limited exam due to patient rotation on AP view with leftward curvature of the lumbar spine.  Lateral view demonstrates preservation of the vertebral body and intervertebral disc space heights.  Gaseous distention of small and large bowel with large amount of stool within the colon and rectum as can be seen with constipation and/or impaction.    Ct Head Wo Contrast 01/22/2015  1. Chronic and post treatment changes to the brain. Increased hypodensity in the left occipital lobe since June suggesting new or progressed cerebral edema at that site. 2. No acute intracranial hemorrhage or new cortically based infarct identified.     Mr [name] Contrast 01/22/2015    1. Slight interval increase in size of heterogeneously enhancing left occipital lobe lesion now measuring 19 x 16 x 15 mm with slightly increased vasogenic edema. Given the relative increase in size of this lesion, this is felt to be most consistent with tumor given its progression relative to previous MRI from 11/06/2014. 2. 10 mm right periventricular enhancing nodule, not significantly changed relative to previous MRI from 12/08/2014, but increased in size relative to 11/06/2014. No associated edema. 3. Stable postoperative changes from previous right parietal lobe tumor resection. 4. Remote right MCA territory infarct surrounding the resection cavity, stable.    Medical Consultants:  Neurology Neurosurgery Radiation oncology  Palliative care  Other Consultants:  SLP Physical therapy Nutrition  IAnti-Infectives:   None    Marvon Shillingburg, DO  [facility] Pager [phone]  Time spent in minutes: 25  minutes  If 7PM-7AM, please contact night-coverage [email or web address] Password [email or web address] 01/27/2015, 12:17 PM   LOS: 5 days    HPI/Subjective: Will awaken and speak a few words  Objective: Filed Vitals:   01/26/15 2213 01/27/15 0114 01/27/15 0622 01/27/15 1000  BP: 102/52 106/70 100/55 110/66  Pulse: 63 62 67 58  Temp: 98.4 F (36.9 C) 98.8 F (37.1 C) 98.2 F (36.8 C) 98.1 F (36.7 C)  TempSrc: Oral Oral Oral Axillary  Resp: 18 18 16 16   Height:      SpO2: 98% 99% 96% 100%    Intake/Output Summary (Last 24 hours) at 01/27/15 1217 Last data filed at 01/26/15 1320  Gross per 24 hour  Intake    240 ml  Output      0 ml  Net    240 ml    Exam:   General:  Pt is not in acute distress  Cardiovascular: Rate controlled, appreciate S1-S2  Respiratory: Bilateral air entry, no wheezing  Abdomen: Nontender, nondistended abdomen, appreciate bowel sounds  Extremities: No lower extremity edema, pulses palpable  Neuro: slow to awake and respond  Data Reviewed: Basic Metabolic Panel:  Recent Labs Lab 01/22/15 1500 01/22/15 2300 01/23/15 0626 01/26/15 0455 01/27/15 0434  NA 152*  --  150* 141 136  K 3.4*  --  3.2* 3.9 3.5  CL 119*  --  121* 115* 107  CO2 21*  --  21* 20* 21*  GLUCOSE 98  --  144* 182* 147*  BUN 19  --  20 30* 28*  CREATININE 1.30* 1.27* 1.27* 1.30* 1.34*  CALCIUM 10.5*  --  10.1 9.6 9.4  MG  --  1.8  --   --   --   PHOS  --  3.2  --   --   --    Liver Function Tests:  Recent Labs Lab 01/22/15 1500  AST 42*  ALT 27  ALKPHOS 77  BILITOT 0.8  PROT 6.3*  ALBUMIN 3.6   No results for input(s): LIPASE, AMYLASE in the last 168 hours.  Recent Labs Lab 01/23/15 1720  AMMONIA 46*   CBC:  Recent Labs Lab 01/22/15 1500 01/22/15 2300 01/23/15 0626 01/26/15 0455 01/27/15 0434  WBC 5.5 3.7* 3.0* 3.6* 4.2  NEUTROABS 3.0  --   --   --   --   HGB 11.0* 10.5* 10.0* 9.8* 10.5*  HCT 33.9* 33.0* 31.9* 31.4* 32.3*  MCV 82.3 83.3 83.5 83.1 82.2   PLT 120* 104* 117* 80* 92*   Cardiac Enzymes:  Recent Labs Lab 01/22/15 1500  TROPONINI <0.03   BNP: Invalid input(s): POCBNP CBG:  Recent Labs Lab 01/24/15 2002 01/24/15 2319 01/25/15 0422  GLUCAP 129* 153* 152*    No results found for this or any previous visit (from the past 240 hour(s)).   Scheduled Meds: . ARIPiprazole  7 mg Oral Daily  . baclofen  10 mg Oral BID  . dexamethasone  2 mg Intravenous 4 times per day  . feeding supplement (ENSURE ENLIVE)  237 mL Oral BID BM  . FLUoxetine  60 mg Oral Daily  . heparin  5,000 Units Subcutaneous 3 times per day  . lacosamide  50 mg Oral BID  . lamoTRIgine  100 mg Oral Daily  . levETIRAcetam  500 mg Oral Q12H  . sodium chloride  3 mL Intravenous Q12H   Continuous Infusions:

## 2015-01-28 MED ORDER — DEXAMETHASONE 4 MG PO TABS
4.0000 mg | ORAL_TABLET | Freq: Three times a day (TID) | ORAL | Status: DC
  Administered 2015-01-28 – 2015-01-29 (×3): 4 mg via ORAL
  Filled 2015-01-28 (×3): qty 1

## 2015-01-28 NOTE — Progress Notes (Signed)
Patient ID: [name], female   DOB: 15-Apr-1961, 54 y.o.   MRN: [id number] [facility] PROGRESS NOTE  [name] GYK:[id number] DOB: [birth year] DOA: 01/22/2015 PCP: Carla Cranker, DO  Brief narrative:    54 y.o. 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 Patient has had a prior admissions for altered mental status secondary to urinary tract infection.   On this admission, patient presented with altered mental status as well. MRI on this admission demonstrated slight interval increase in size of heterogeneously enhancing left occipital lobe lesion now measuring 19 x 16 x 15 mm with slightly increased vasogenic edema and given the relative increase in the size of this lesion this is felt to be most consistent with tumor and its progression.  Neurosurgery has seen the patient in consultation and does not feel that patient is a candidate for surgical intervention. Per Dr. [name] of radiation oncology the only potential treatment options such as surgery, chemo or radiotherapy are likely to introduce risk of harm without necessarily providing any palliative benefit.  Anticipated discharge: likely by Monday 01/29/15 to SNF.    Assessment/Plan:    Principal problem: Acute encephalopathy secondary to progression of brain tumor / right parietal astrocytoma / left occipital lobe lesion with increased vasogenic edema / seizures  - Patient was previously treated for anaplastic astrocytoma about 4 years ago with good local control but unfortunately suffered a debilitating stroke thereafter. Her baseline performance status have declined since. She has had previous presentations with altered mental status which were thought to be secondary to UTI. Transiently mental status would improve with  treatment for UTI. - Patient has seen Dr. [name] of radiation oncology, last appointment 12/15/2014 at which time patient and family decided that they will have follow-up MRI. She did have MRI in June 2016 which showed new left-sided 11 mm enhancing lesion suggestive of malignancy. - MRI on this admission demonstrated interval increase in the size of left occipital lobe lesion with increased vasogenic edema. Per neurosurgery, patient is not a  surgical candidate. Radiation oncology also feels that chemo, radiation may offer risk of harm rather than benefit  - No significant changes in mental status since the admission - No reports of seizures. We will continue Keppra, Lamictal and Vimpat. - Palliative care consulted for goals of care. - change decadron to PO - Return to SNF   Hypernatremia - Likely from dehydration secondary to poor mental status - Sodium normalized with IV fluids.  Hypokalemia - Likely from poor by mouth intake - Supplemented and within normal limits.  Leukopenia - Secondary to history of malignancy. No neutropenia. -  No fevers.  Anemia of chronic disease / thrombocytopenia - Secondary to history of malignancy - Hemoglobin stable at 9.8 - Platelet count 80 this morning - No current indications for transfusion  Chronic kidney disease, stage III - Baseline creatinine 1.18 and slightly above the baseline range, 1.30 on this admission. Likely prerenal from poor by mouth intake, dehydration - Creatinine stable at 1.3.  Depression - Continue Abilify and Prozac - Stable   Cerebral thrombosis with cerebral infarction - History of CVA. Stable.  Adult failure to thrive / severe protein calorie malnutrition - In the context of chronic illness, malignancy, poor mental status - Continue nutritional supplementation   DVT Prophylaxis  - SCDs bilaterally  Code Status: DNR Family Communication: no family at bedside Disposition Plan: to SNF Monday?  IV access:   Peripheral IV  Procedures and diagnostic studies:    Dg Chest 1 View 01/22/2015 No active disease.  Chronic elevation of the right hemidiaphragm.     Dg Lumbar Spine 2-3 Views 01/23/2015  Markedly limited exam due to patient rotation on AP view with leftward curvature of the lumbar spine.  Lateral view demonstrates preservation of the vertebral body and intervertebral disc space heights.  Gaseous distention of small and large bowel with large amount of stool within the colon and rectum as can be seen with constipation and/or impaction.    Ct Head Wo Contrast 01/22/2015  1. Chronic and post treatment changes to the brain. Increased hypodensity in the left occipital lobe since June suggesting new or progressed cerebral edema at that site. 2. No acute intracranial hemorrhage or new cortically based infarct identified.     Mr [name] Contrast 01/22/2015    1. Slight interval increase in size of heterogeneously enhancing left occipital lobe lesion now measuring 19 x 16 x 15 mm with slightly increased vasogenic edema. Given the relative increase in size of this lesion, this is felt to be most consistent with tumor given its progression relative to previous MRI from 11/06/2014. 2. 10 mm right periventricular enhancing nodule, not significantly changed relative to previous MRI from 12/08/2014, but increased in size relative to 11/06/2014. No associated edema. 3. Stable postoperative changes from previous right parietal lobe tumor resection. 4. Remote right MCA territory infarct surrounding the resection cavity, stable.    Medical Consultants:  Neurology Neurosurgery Radiation oncology  Palliative care  Other Consultants:  SLP Physical therapy Nutrition  IAnti-Infectives:   None    [name], DO  [facility] Pager [phone]  Time spent in minutes: 25 minutes  If 7PM-7AM, please contact night-coverage [email or web address] Password [email or web address] 01/28/2015, 12:54 PM   LOS: 6 days     HPI/Subjective: Will awaken and speak a few words  Objective: Filed Vitals:   01/28/15 0000 01/28/15 0139 01/28/15 0535 01/28/15 1033  BP:  104/62 107/57 99/57  Pulse:  61 65 65  Temp:  98.2 F (36.8 C) 98.4 F (36.9 C) 98.1 F (36.7 C)  TempSrc:  Axillary Axillary Oral  Resp: 16 18 18 14   Height:      SpO2:  99% 98% 96%    Intake/Output Summary (Last 24 hours) at 01/28/15 1254 Last data filed at 01/28/15 0700  Gross per 24 hour  Intake    243 ml  Output      0 ml  Net    243 ml    Exam:   General:  Resting, slow to awaken  Cardiovascular: Rate controlled, appreciate S1-S2  Respiratory: Bilateral air entry, no wheezing  Abdomen: Nontender, nondistended abdomen, appreciate bowel sounds  Extremities: No lower extremity edema, pulses palpable  Neuro: slow to awake and respond  Data Reviewed: Basic Metabolic Panel:  Recent Labs Lab 01/22/15 1500 01/22/15 2300 01/23/15 0626 01/26/15 0455 01/27/15 0434  NA 152*  --  150* 141 136  K 3.4*  --  3.2* 3.9 3.5  CL 119*  --  121* 115* 107  CO2 21*  --  21* 20* 21*  GLUCOSE 98  --  144* 182* 147*  BUN 19  --  20 30* 28*  CREATININE 1.30* 1.27* 1.27* 1.30* 1.34*  CALCIUM 10.5*  --  10.1 9.6 9.4  MG  --  1.8  --   --   --  PHOS  --  3.2  --   --   --    Liver Function Tests:  Recent Labs Lab 01/22/15 1500  AST 42*  ALT 27  ALKPHOS 77  BILITOT 0.8  PROT 6.3*  ALBUMIN 3.6   No results for input(s): LIPASE, AMYLASE in the last 168 hours.  Recent Labs Lab 01/23/15 1720  AMMONIA 46*   CBC:  Recent Labs Lab 01/22/15 1500 01/22/15 2300 01/23/15 0626 01/26/15 0455 01/27/15 0434  WBC 5.5 3.7* 3.0* 3.6* 4.2  NEUTROABS 3.0  --   --   --   --   HGB 11.0* 10.5* 10.0* 9.8* 10.5*  HCT 33.9* 33.0* 31.9* 31.4* 32.3*  MCV 82.3 83.3 83.5 83.1 82.2  PLT 120* 104* 117* 80* 92*   Cardiac Enzymes:  Recent Labs Lab 01/22/15 1500  TROPONINI <0.03   BNP: Invalid input(s): POCBNP CBG:  Recent  Labs Lab 01/24/15 2002 01/24/15 2319 01/25/15 0422  GLUCAP 129* 153* 152*    No results found for this or any previous visit (from the past 240 hour(s)).   Scheduled Meds: . ARIPiprazole  7 mg Oral Daily  . baclofen  10 mg Oral BID  . dexamethasone  4 mg Oral 3 times per day  . feeding supplement (ENSURE ENLIVE)  237 mL Oral BID BM  . FLUoxetine  60 mg Oral Daily  . heparin  5,000 Units Subcutaneous 3 times per day  . lacosamide  50 mg Oral BID  . lamoTRIgine  100 mg Oral Daily  . levETIRAcetam  500 mg Oral Q12H  . sodium chloride  3 mL Intravenous Q12H   Continuous Infusions:

## 2015-01-29 LAB — BASIC METABOLIC PANEL
Anion gap: 10 (ref 5–15)
BUN: 42 mg/dL — AB (ref 6–20)
CALCIUM: 9.8 mg/dL (ref 8.9–10.3)
CO2: 19 mmol/L — ABNORMAL LOW (ref 22–32)
CREATININE: 1.65 mg/dL — AB (ref 0.44–1.00)
Chloride: 108 mmol/L (ref 101–111)
GFR calc Af Amer: 39 mL/min — ABNORMAL LOW (ref >60)
GFR, EST NON AFRICAN AMERICAN: 34 mL/min — AB (ref >60)
GLUCOSE: 122 mg/dL — AB (ref 65–99)
Potassium: 3.4 mmol/L — ABNORMAL LOW (ref 3.5–5.1)
Sodium: 137 mmol/L (ref 135–145)

## 2015-01-29 LAB — CBC
HCT: 32.5 % — ABNORMAL LOW (ref 36.0–46.0)
Hemoglobin: 10.7 g/dL — ABNORMAL LOW (ref 12.0–15.0)
MCH: 26.7 pg (ref 26.0–34.0)
MCHC: 32.9 g/dL (ref 30.0–36.0)
MCV: 81 fL (ref 78.0–100.0)
PLATELETS: 60 10*3/uL — AB (ref 150–400)
RBC: 4.01 MIL/uL (ref 3.87–5.11)
RDW: 15.8 % — AB (ref 11.5–15.5)
WBC: 10.1 10*3/uL (ref 4.0–10.5)

## 2015-01-29 MED ORDER — ALBUTEROL SULFATE (2.5 MG/3ML) 0.083% IN NEBU: 2.5000 mg | INHALATION_SOLUTION | RESPIRATORY_TRACT | Status: AC | PRN

## 2015-01-29 MED ORDER — ARIPIPRAZOLE 5 MG PO TABS: 5.0000 mg | ORAL_TABLET | Freq: Every day | ORAL | Status: DC

## 2015-01-29 MED ORDER — ENSURE ENLIVE PO LIQD: 237.0000 mL | Freq: Two times a day (BID) | ORAL | Status: DC

## 2015-01-29 MED ORDER — ARIPIPRAZOLE 2 MG PO TABS: 2.0000 mg | ORAL_TABLET | Freq: Every day | ORAL | Status: DC

## 2015-01-29 MED ORDER — DEXAMETHASONE 4 MG PO TABS: 4.0000 mg | ORAL_TABLET | Freq: Three times a day (TID) | ORAL | Status: DC

## 2015-01-29 MED ORDER — FLUOXETINE HCL 20 MG PO CAPS: 60.0000 mg | ORAL_CAPSULE | Freq: Every day | ORAL | Status: DC

## 2015-01-29 MED ORDER — POTASSIUM CHLORIDE CRYS ER 20 MEQ PO TBCR
20.0000 meq | EXTENDED_RELEASE_TABLET | Freq: Once | ORAL | Status: AC
  Administered 2015-01-29: 20 meq via ORAL
  Filled 2015-01-29: qty 1

## 2015-01-29 NOTE — Discharge Summary (Addendum)
Physician Discharge Summary  [name] YQI:[id number] DOB: 28-Jun-1960 DOA: 01/22/2015  PCP: [name], DO  Admit date: 01/22/2015 Discharge date: 01/29/2015  Recommendations for Outpatient Follow-up:  1. Please have palliative care services follow patient in skilled nursing facility. Continue to address goals of care as needed. 2. Please recheck BMP in next couple of days to monitor kidney function.  Discharge Diagnoses:  Principal Problem:   Metabolic encephalopathy Active Problems:   Astrocytoma brain tumor   Seizures   Acute encephalopathy   Depression   Cerebral thrombosis with cerebral infarction    Discharge Condition: stable   Diet recommendation: as tolerated   History of present illness:  54 y.o. 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 Patient has had a prior admissions for altered mental status secondary to urinary tract infection.   On this admission, patient presented with altered mental status as well. MRI on this admission demonstrated slight interval increase in size of heterogeneously enhancing left occipital lobe lesion now measuring 19 x 16 x 15 mm with slightly increased vasogenic edema and given the relative increase in the size of this lesion this is felt to be most consistent with tumor and its progression.  Neurosurgery has seen the patient in consultation and does not feel that patient is a candidate for surgical intervention. Per Dr. [name] of radiation oncology the only potential treatment options such as surgery, chemo or radiotherapy are likely to introduce risk of harm without necessarily providing any palliative benefit.  Hospital Course:     Assessment/Plan:    Principal problem: Acute encephalopathy secondary to progression of  brain tumor / right parietal astrocytoma / left occipital lobe lesion with increased vasogenic edema / seizures  - Patient was previously treated for anaplastic astrocytoma about 4 years ago with good local control but unfortunately suffered a debilitating stroke thereafter. Her baseline performance status have declined since. She has had previous presentations with altered mental status which were thought to be secondary to UTI. - Patient has seen Dr. [name] of radiation oncology, last appointment 12/15/2014 at which time patient and family decided that they will have follow-up MRI. She did have MRI in June 2016 which showed new left-sided 11 mm enhancing lesion suggestive of malignancy. - MRI on this admission demonstrated interval increase in the size of left occipital lobe lesion with increased vasogenic edema. Per neurosurgery, patient is not a surgical candidate. Radiation oncology also feels that chemo, radiation may offer risk of harm rather than benefit  - No significant changes in mental status since the admission - Continue Keppra, Lamictal and Vimpat on discharge  - Patient will continue Decadron by mouth as prescribed.  Hypernatremia - Likely from dehydration secondary to poor mental status - Sodium normalized with IV fluids.  Hypokalemia - Likely from poor by mouth intake - Continue to supplement potassium.  Leukopenia - Secondary to history of malignancy. No neutropenia.  Anemia of chronic disease / thrombocytopenia - Secondary to history of malignancy - Hemoglobin stable at 10.7 - Platelet count 60 this morning - No reports of bleeding - No current indications for transfusion  Chronic kidney disease, stage III - Baseline creatinine 1.18 and slightly above the baseline range, 1.30 on this admission. Likely prerenal from poor by mouth intake, dehydration - Creatinine slightly up this morning, 1.65. Please check creatinine in  the next couple of days in skilled nursing  facility.  Depression - Continue Abilify and Prozac  Cerebral thrombosis with cerebral infarction - History of CVA. Stable.  Adult failure to thrive / severe protein calorie malnutrition - In the context of chronic illness, malignancy, poor mental status - Continue nutritional supplementation   DVT Prophylaxis  - SCDs bilaterally because of thrombocytopenia   Code Status: DNR/DNI Family Communication: no family at bedside; updated the pt's father 8/26   IV access:  Peripheral IV  Procedures and diagnostic studies:   Dg Chest 1 View 01/22/2015 No active disease. Chronic elevation of the right hemidiaphragm.   Dg Lumbar Spine 2-3 Views 01/23/2015 Markedly limited exam due to patient rotation on AP view with leftward curvature of the lumbar spine. Lateral view demonstrates preservation of the vertebral body and intervertebral disc space heights. Gaseous distention of small and large bowel with large amount of stool within the colon and rectum as can be seen with constipation and/or impaction.   Ct Head Wo Contrast 01/22/2015 1. Chronic and post treatment changes to the brain. Increased hypodensity in the left occipital lobe since June suggesting new or progressed cerebral edema at that site. 2. No acute intracranial hemorrhage or new cortically based infarct identified.   Mr [name] Contrast 01/22/2015 1. Slight interval increase in size of heterogeneously enhancing left occipital lobe lesion now measuring 19 x 16 x 15 mm with slightly increased vasogenic edema. Given the relative increase in size of this lesion, this is felt to be most consistent with tumor given its progression relative to previous MRI from 11/06/2014. 2. 10 mm right periventricular enhancing nodule, not significantly changed relative to previous MRI from 12/08/2014, but increased in size relative to 11/06/2014. No associated edema. 3. Stable postoperative changes from previous right parietal lobe  tumor resection. 4. Remote right MCA territory infarct surrounding the resection cavity, stable.   Medical Consultants:  Neurology Neurosurgery Radiation oncology  Palliative care  Other Consultants:  SLP Physical therapy Nutrition  IAnti-Infectives:   None    Signed:  Leisa Lenz, MD  [facility] 01/29/2015, 9:26 AM  Pager #: [phone]  Time spent in minutes: more than 30 minutes  Discharge Exam: Filed Vitals:   01/29/15 0518  BP: 117/76  Pulse: 72  Temp: 97.8 F (36.6 C)  Resp: 20   Filed Vitals:   01/28/15 1740 01/28/15 2202 01/29/15 0152 01/29/15 0518  BP: 110/57 123/72 118/71 117/76  Pulse: 68 68 75 72  Temp: 98.2 F (36.8 C) 98.3 F (36.8 C) 98.5 F (36.9 C) 97.8 F (36.6 C)  TempSrc: Axillary Oral Oral Oral  Resp: 18 18 20 20   Height:      SpO2: 97% 99% 99% 96%    General: Pt is alert, not in acute distress Cardiovascular: Regular rate and rhythm, S1/S2 + Respiratory: Clear to auscultation bilaterally, no wheezing, no crackles, no rhonchi Abdominal: Soft, non tender, non distended, bowel sounds +, no guarding Extremities: no edema, no cyanosis, pulses palpable bilaterally DP and PT Neuro: Grossly nonfocal  Discharge Instructions  Discharge Instructions    Call MD for:  difficulty breathing, headache or visual disturbances    Complete by:  As directed      Call MD for:  persistant nausea and vomiting    Complete by:  As directed      Call MD for:  severe uncontrolled pain    Complete by:  As directed      Diet - low  sodium heart healthy    Complete by:  As directed      Discharge instructions    Complete by:  As directed   1. Please have palliative care services follow patient in skilled nursing facility. Continue to address goals of care as needed.     Increase activity slowly    Complete by:  As directed             Medication List    STOP taking these medications        bisacodyl 10 MG suppository   Commonly known as:  DULCOLAX      TAKE these medications        acetaminophen 325 MG tablet  Commonly known as:  TYLENOL  Take 650 mg by mouth 3 (three) times daily. For pain (6am, 2pm, 10pm)     albuterol (2.5 MG/3ML) 0.083% nebulizer solution  Commonly known as:  PROVENTIL  Take 3 mLs (2.5 mg total) by nebulization every 4 (four) hours as needed for wheezing.     ARIPiprazole 2 MG tablet  Commonly known as:  ABILIFY  Take 1 tablet (2 mg total) by mouth daily. Take with a 5 mg tablet for a 7 mg dose every morning at 9am     ARIPiprazole 5 MG tablet  Commonly known as:  ABILIFY  Take 1 tablet (5 mg total) by mouth daily. For psychosis: Take with a 2 mg tablet for a 7 mg dose every morning at 9am     aspirin 325 MG EC tablet  Take 325 mg by mouth at bedtime. For cerebral aneurysm     atorvastatin 10 MG tablet  Commonly known as:  LIPITOR  Take 10 mg by mouth at bedtime. For lipoproteins     baclofen 10 MG tablet  Commonly known as:  LIORESAL  Take 1 tablet (10 mg total) by mouth 3 (three) times daily as needed for muscle spasms.     dexamethasone 4 MG tablet  Commonly known as:  DECADRON  Take 1 tablet (4 mg total) by mouth every 8 (eight) hours.     feeding supplement (ENSURE ENLIVE) Liqd  Take 237 mLs by mouth 2 (two) times daily between meals.     FLUoxetine 20 MG capsule  Commonly known as:  PROZAC  Take 3 capsules (60 mg total) by mouth daily.     gabapentin 300 MG capsule  Commonly known as:  NEURONTIN  Take 1 capsule (300 mg total) by mouth at bedtime.     lacosamide 50 MG Tabs tablet  Commonly known as:  VIMPAT  Take one tablet by mouth twice daily     lamoTRIgine 100 MG tablet  Commonly known as:  LAMICTAL  Take 1 tablet (100 mg total) by mouth daily. For mood disorder     levETIRAcetam 500 MG tablet  Commonly known as:  KEPPRA  Take 1 tablet (500 mg total) by mouth every 12 (twelve) hours.     multivitamin with minerals Tabs tablet  Take 1 tablet  by mouth daily.     omeprazole 20 MG capsule  Commonly known as:  PRILOSEC  Take 20 mg by mouth daily at 6 (six) AM.     polyethylene glycol packet  Commonly known as:  MIRALAX / GLYCOLAX  Take 17 g by mouth daily.     potassium chloride SA 20 MEQ tablet  Commonly known as:  K-DUR,KLOR-CON  Take 20 mEq by mouth daily.     PRESCRIPTION MEDICATION  Take  60 mLs by mouth 2 (two) times daily. 2 cal supplement (8am and 5pm)           Follow-up Information    Schedule an appointment as soon as possible for a visit with [name], DO.   Specialty:  Internal Medicine   Why:  Follow up appt after recent hospitalization   Contact information:   [place] [address] [phone]        The results of significant diagnostics from this hospitalization (including imaging, microbiology, ancillary and laboratory) are listed below for reference.    Significant Diagnostic Studies: Dg Chest 1 View  01/22/2015   CLINICAL DATA:  Mental status changes  EXAM: CHEST  1 VIEW  COMPARISON:  11/05/2014  FINDINGS: Cardiomegaly again noted. Chronic elevation of the right hemidiaphragm. No acute infiltrate or pleural effusion. No pulmonary edema.  IMPRESSION: No active disease.  Chronic elevation of the right hemidiaphragm.   Electronically Signed   By: Lahoma Crocker M.D.   On: 01/22/2015 15:50   Dg Lumbar Spine 2-3 Views  01/23/2015   CLINICAL DATA:  Patient with back pain.  History of stroke.  EXAM: LUMBAR SPINE - 2-3 VIEW  COMPARISON:  Abdominal radiograph 11/13/2014  FINDINGS: Marked leftward curvature of the lumbar spine with associated rotation on AP view, limiting evaluation. Large amount of stool within the rectum. Diffuse gaseous distention of the small and large bowel. Lateral view demonstrates preservation of the vertebral body and intervertebral disc space heights with lower lumbar spine facet degenerative changes.  IMPRESSION: Markedly limited exam due to patient rotation on  AP view with leftward curvature of the lumbar spine.  Lateral view demonstrates preservation of the vertebral body and intervertebral disc space heights.  Gaseous distention of small and large bowel with large amount of stool within the colon and rectum as can be seen with constipation and/or impaction.   Electronically Signed   By: Lovey Newcomer M.D.   On: 01/23/2015 18:43   Ct Head Wo Contrast  01/22/2015   CLINICAL DATA:  54 year old female with increased lethargy and abnormal potassium level. Initial encounter.  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  EXAM: CT HEAD WITHOUT CONTRAST  TECHNIQUE: Contiguous axial images were obtained from the base of the skull through the vertex without intravenous contrast.  COMPARISON:  Post treatment brain MRI 12/08/2014 and earlier.  FINDINGS: Sequelae of right posterior convexity craniotomy. No acute osseous abnormality identified. Visualized paranasal sinuses and mastoids are clear. No acute orbit or scalp soft tissue findings.  Right ICA siphon vascular stent appears stable in configuration. Right parietal resection cavity measuring up to 5 cm is stable since the July MRI. Hypodensity in the surrounding right hemisphere white matter corresponds to T2 hyperintensity on that study. Posterior right temporal lobe encephalomalacia a re- demonstrated. Stable ventricle size and configuration. Heterogeneity in the left occipital lobe at the site of intra-axial hemorrhage shows regression of CT hyperdensity since June. However, there is increased surrounding hypodensity suggesting increased occipital lobe edema (series 201, images 14 and 15 today versus series 2, images 16 and 17 in June). No superimposed acute intracranial hemorrhage identified. Small volume right hemisphere extra-axial collection  appears stable. Basilar cisterns remain patent.  IMPRESSION: 1. Chronic and post treatment changes to the brain. Increased hypodensity in the left occipital lobe since June  suggesting new or progressed cerebral edema at that site. 2. No acute intracranial hemorrhage or new cortically based infarct identified.   Electronically Signed   By: Genevie Ann M.D.   On: 01/22/2015 15:55   Mr Jeri Cos CH Contrast  01/22/2015   CLINICAL DATA:  Initial evaluation for acute altered mental status. History of right parietal astrocytoma.  EXAM: MRI HEAD WITHOUT AND WITH CONTRAST  TECHNIQUE: Multiplanar, multiecho pulse sequences of the brain and surrounding structures were obtained without and with intravenous contrast.  CONTRAST:  26mL MULTIHANCE GADOBENATE DIMEGLUMINE 529 MG/ML IV SOLN  COMPARISON:  Prior CT from earlier the same day as well as previous MRI from 12/08/2014.  FINDINGS: The right parietal resection cavity is similar in size relative to previous study. Restricted diffusion at the superior aspect of the cavity slightly less prominent as compared to previous study. Layering hematocrit level again noted within the resection cavity. Chronic hemosiderin staining about the resection cavity. Additionally, there is chronic hemosiderin staining overlying the right cerebral convexity. No significant enhancement about the resection cavity itself.  Remote right MCA territory infarct with associated laminar necrosis again noted. T2/FLAIR changes throughout the right cerebral hemisphere are similar to previous, related to post treatment changes. Associated ex vacuo dilatation of the right lateral ventricle. Slight left-to-right midline shift is stable. No hydrocephalus. Dural thickening and enhancement overlying the right cerebral hemisphere is stable.  10 mm focus of nodular enhancement along the body of the right lateral ventricle noted, similar to previous. Additional heterogeneous enhancing hemorrhagic lesion within the left  occipital lobe is slightly increased in size measuring 19 x 16 x 15 mm. Specifically, this lesion has increased in size along its anterior margin. Small amount of hemorrhage within the adjacent left occipital lobe, and likely the right occipital lobe as well, slightly less conspicuous as compared to prior, although this may be related to differences in technique as that study was performed on the 3 Tesla magnet and as opposed to the 1.5 Tesla on this study. Associated vasogenic edema within the left occipital lobe appears slightly increased. Given the relative increase in size from most recent MRI from 12/08/2014, this is favored to reflect tumor rather than hematoma.  No acute large vessel territory infarct. Normal intravascular flow voids are maintained.  Craniocervical junction within normal limits. Pituitary gland normal.  No acute abnormality about the orbits.  Mild mucosal thickening within the maxillary sinuses bilaterally. Paranasal sinuses are otherwise clear. Trace opacity within the left mastoid air cells. Inner ear structures grossly normal.  Bone marrow signal intensity within normal limits. Scalp soft tissues unremarkable.  IMPRESSION: 1. Slight interval increase in size of heterogeneously enhancing left occipital lobe lesion now measuring 19 x 16 x 15 mm with slightly increased vasogenic edema. Given the relative increase in size of this lesion, this is felt to be most consistent with tumor given its progression relative to previous MRI from 11/06/2014. 2. 10 mm right periventricular enhancing nodule, not significantly changed relative to previous MRI from 12/08/2014, but increased in size relative to 11/06/2014. No associated edema. 3. Stable postoperative changes from previous right parietal lobe tumor resection. 4. Remote right MCA territory infarct surrounding the resection cavity, stable.   Electronically Signed   By: Jeannine Boga M.D.   On: 01/22/2015 23:23    Microbiology: No  results found for this or any previous visit (from the past 240 hour(s)).   Labs: Basic Metabolic Panel:  Recent Labs Lab 01/22/15 1500 01/22/15 2300 01/23/15  1219 01/26/15 0455 01/27/15 0434 01/29/15 0418  NA 152*  --  150* 141 136 137  K 3.4*  --  3.2* 3.9 3.5 3.4*  CL 119*  --  121* 115* 107 108  CO2 21*  --  21* 20* 21* 19*  GLUCOSE 98  --  144* 182* 147* 122*  BUN 19  --  20 30* 28* 42*  CREATININE 1.30* 1.27* 1.27* 1.30* 1.34* 1.65*  CALCIUM 10.5*  --  10.1 9.6 9.4 9.8  MG  --  1.8  --   --   --   --   PHOS  --  3.2  --   --   --   --    Liver Function Tests:  Recent Labs Lab 01/22/15 1500  AST 42*  ALT 27  ALKPHOS 77  BILITOT 0.8  PROT 6.3*  ALBUMIN 3.6   No results for input(s): LIPASE, AMYLASE in the last 168 hours.  Recent Labs Lab 01/23/15 1720  AMMONIA 46*   CBC:  Recent Labs Lab 01/22/15 1500 01/22/15 2300 01/23/15 0626 01/26/15 0455 01/27/15 0434 01/29/15 0418  WBC 5.5 3.7* 3.0* 3.6* 4.2 10.1  NEUTROABS 3.0  --   --   --   --   --   HGB 11.0* 10.5* 10.0* 9.8* 10.5* 10.7*  HCT 33.9* 33.0* 31.9* 31.4* 32.3* 32.5*  MCV 82.3 83.3 83.5 83.1 82.2 81.0  PLT 120* 104* 117* 80* 92* 60*   Cardiac Enzymes:  Recent Labs Lab 01/22/15 1500  TROPONINI <0.03   BNP: BNP (last 3 results) No results for input(s): BNP in the last 8760 hours.  ProBNP (last 3 results) No results for input(s): PROBNP in the last 8760 hours.  CBG:  Recent Labs Lab 01/24/15 2002 01/24/15 2319 01/25/15 0422  GLUCAP 129* 153* 152*

## 2015-01-29 NOTE — Discharge Instructions (Signed)

## 2015-01-29 NOTE — Progress Notes (Signed)
Pt discharged to [facility] at 1400. Transported to SNF via [facility]. Wendee Copp

## 2015-01-29 NOTE — Clinical Social Work Note (Signed)
Clinical Social Worker facilitated patient discharge including contacting patient family (let message with father and contacted close family friend, Buster at [phone] who indicated father is aware of patient discharging today) and facility to confirm patient discharge plans.  Clinical information faxed to facility and family agreeable with plan.  CSW arranged ambulance transport via [facility] to [facility].  RN to call report prior to discharge.  DC packet prepared and on chart for transport.   Clinical Social Worker will sign off for now as social work intervention is no longer needed. Please consult [place] again if new need arises.  Glendon Axe, MSW, LCSWA [phone] 01/29/2015 12:32 PM

## 2015-01-29 NOTE — Care Management Important Message (Signed)
Important Message  Patient Details  Name: [name] MRN: [id number] Date of Birth: Jul 14, 1960   Medicare Important Message Given:  Yes-third notification given    [name] 01/29/2015, 11:35 AM

## 2015-01-29 NOTE — Progress Notes (Addendum)
Patient is stable for discharge today, back to SNF: [facility] [place]. Patient's packet completed and printed with DC summary attached. Call placed to facility, aware of DC and agreeable for return.  Patient will transport by EMS in which LCSW will arrange.   FL2 signed and DNR form completed.  No barriers to DC. Patient's father: [name] [facility]) was called via phone number of facesheet, but phone reports "this person is not accepting calls at this time". Call placed to patient's spouse, [name], however phone went to voice mail with no identifying means that this was his voicemail, thus no message left.  **Will call facility to obtain any additional numbers that LCSW can call for patient's father/HCPOA  [name], MSW Clinical Social Work: Emergency Room [phone]

## 2015-01-30 ENCOUNTER — Non-Acute Institutional Stay (SKILLED_NURSING_FACILITY): Payer: Medicare Other | Admitting: Adult Health

## 2015-01-30 ENCOUNTER — Encounter: Payer: Self-pay | Admitting: Adult Health

## 2015-01-30 ENCOUNTER — Other Ambulatory Visit: Payer: Self-pay

## 2015-01-30 ENCOUNTER — Ambulatory Visit (HOSPITAL_COMMUNITY): Payer: Medicare Other

## 2015-01-30 DIAGNOSIS — F29 Unspecified psychosis not due to a substance or known physiological condition: Secondary | ICD-10-CM | POA: Diagnosis not present

## 2015-01-30 DIAGNOSIS — R569 Unspecified convulsions: Secondary | ICD-10-CM

## 2015-01-30 DIAGNOSIS — C7931 Secondary malignant neoplasm of brain: Secondary | ICD-10-CM

## 2015-01-30 DIAGNOSIS — E785 Hyperlipidemia, unspecified: Secondary | ICD-10-CM

## 2015-01-30 DIAGNOSIS — F418 Other specified anxiety disorders: Secondary | ICD-10-CM | POA: Diagnosis not present

## 2015-01-30 DIAGNOSIS — K2100 Gastro-esophageal reflux disease with esophagitis, without bleeding: Secondary | ICD-10-CM

## 2015-01-30 DIAGNOSIS — I6330 Cerebral infarction due to thrombosis of unspecified cerebral artery: Secondary | ICD-10-CM

## 2015-01-30 DIAGNOSIS — C719 Malignant neoplasm of brain, unspecified: Secondary | ICD-10-CM | POA: Diagnosis not present

## 2015-01-30 MED ORDER — LACOSAMIDE 50 MG PO TABS: ORAL_TABLET | ORAL | Status: DC

## 2015-01-30 MED ORDER — LORAZEPAM 2 MG/ML PO CONC: 1.0000 mg | Freq: Four times a day (QID) | ORAL | Status: AC

## 2015-01-30 MED ORDER — MORPHINE SULFATE (CONCENTRATE) 10 MG /0.5 ML PO SOLN: 5.0000 mg | Freq: Four times a day (QID) | ORAL | Status: AC

## 2015-01-30 NOTE — Progress Notes (Signed)
Patient ID: [name], female   DOB: 01-03-1961, 54 y.o.   MRN: [id number]   Facility: [facility]      Allergies  Allergen Reactions  . Vasotec Other (See Comments)    Causes angioedema    Chief Complaint  Patient presents with  . Hospitalization Follow-up    HPI:  She is a long term resident of this facility who has been hospitalized acute encephalopathy due to progression of her brain tumor. She is not a candidate for surgery; radiation; or chemotherapy. She was discharged to this facility for palliative/hospice care. She is not responsive   Past Medical History  Diagnosis Date  . 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 Fracture, ankle 2012    right  . History of radiation therapy 09/23/10- 11/07/10    right parietal lobe  . Chronic headaches     uses oxycodone appropriately  . Anxiety   . Muscle spasticity   . Seizures   . Dysphagia   . GERD (gastroesophageal reflux disease)   . Nontraumatic subarachnoid hemorrhage   . Pure hypercholesterolemia   . Mild mood disorder   . Atypical psychosis   . Epilepsy   . Problems with swallowing and mastication   . Depression     Past Surgical History  Procedure Laterality Date  . Orif ankle fracture  08/22/2010    right  . Radiology with anesthesia N/A 05/02/2013    Procedure: RADIOLOGY WITH ANESTHESIA;  Surgeon: Consuella Lose, MD;  Location: [place];  Service: Radiology;  Laterality: N/A;  . Brain surgery Right 2012    astrocytoma R parietal lobe    VITAL SIGNS BP 128/68 mmHg  Pulse 66  Ht 5\' 3"  (1.6 m)  Wt 144 lb (65.318 kg)  BMI 25.51 kg/m2  SpO2 95%  LMP 08/26/2010  Patient's Medications  New Prescriptions   No medications on file  Previous Medications   ACETAMINOPHEN (TYLENOL) 325 MG TABLET    Take 650 mg by mouth 3 (three) times daily. For pain (6am, 2pm, 10pm)   ALBUTEROL (PROVENTIL) (2.5 MG/3ML) 0.083% NEBULIZER SOLUTION    Take 3 mLs (2.5 mg total) by nebulization  every 4 (four) hours as needed for wheezing.   ARIPIPRAZOLE (ABILIFY) 2 MG TABLET    Take 1 tablet (2 mg total) by mouth daily. Take with a 5 mg tablet for a 7 mg dose every morning at 9am   ARIPIPRAZOLE (ABILIFY) 5 MG TABLET    Take 1 tablet (5 mg total) by mouth daily. For psychosis: Take with a 2 mg tablet for a 7 mg dose every morning at 9am   ASPIRIN 325 MG EC TABLET    Take 325 mg by mouth at bedtime. For cerebral aneurysm   ATORVASTATIN (LIPITOR) 10 MG TABLET    Take 10 mg by mouth at bedtime. For lipoproteins   BACLOFEN (LIORESAL) 10 MG TABLET    Take 1 tablet (10 mg total) by mouth 3 (three) times daily as needed for muscle spasms.   DEXAMETHASONE (DECADRON) 4 MG TABLET    Take 1 tablet (4 mg total) by mouth every 8 (eight) hours.   FLUOXETINE (PROZAC) 20 MG CAPSULE    Take 3 capsules (60 mg total) by mouth daily.   GABAPENTIN (NEURONTIN) 300 MG CAPSULE    Take 1 capsule (300 mg total) by mouth at bedtime.   LACOSAMIDE (VIMPAT) 50 MG TABS TABLET    Take one tablet by mouth twice daily  LAMOTRIGINE (LAMICTAL) 100 MG TABLET    Take 1 tablet (100 mg total) by mouth daily. For mood disorder   LEVETIRACETAM (KEPPRA) 500 MG TABLET    Take 1 tablet (500 mg total) by mouth every 12 (twelve) hours.   MULTIPLE VITAMIN (MULTIVITAMIN WITH MINERALS) TABS TABLET    Take 1 tablet by mouth daily.   OMEPRAZOLE (PRILOSEC) 20 MG CAPSULE    Take 20 mg by mouth daily at 6 (six) AM.    POLYETHYLENE GLYCOL (MIRALAX / GLYCOLAX) PACKET    Take 17 g by mouth daily.   POTASSIUM CHLORIDE SA (K-DUR,KLOR-CON) 20 MEQ TABLET    Take 20 mEq by mouth daily.   PRESCRIPTION MEDICATION    Take 60 mLs by mouth 2 (two) times daily. 2 cal supplement (8am and 5pm)  Modified Medications   No medications on file  Discontinued Medications   FEEDING SUPPLEMENT, ENSURE ENLIVE, (ENSURE ENLIVE) LIQD    Take 237 mLs by mouth 2 (two) times daily between meals.     SIGNIFICANT DIAGNOSTIC EXAMS   07-28-14: mri of brain: 1. Stable  postoperative changes the right parietal lobe with a persistent hematocrit level in some enhancement along the superior margin of the surgical cavity without significant interval change. The areas of enhancement correspond to the infarct of 2014. There is persistent enhancement along the more inferior areas of the right MCA territory infarct is well. 2. 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 3. Diffuse dural enhancement over the right convexity is stable. 4. Significant signal loss in the right hemisphere is unchanged.   08-04-14: mammogram: No mammographic evidence of malignancy. A result letter of this screening mammogram will be mailed directly to the patient. RECOMMENDATION: Screening mammogram in one year. (Code:SM-B-01Y)  01-22-15: ct of head: 1. Chronic and post treatment changes to the brain. Increased hypodensity in the left occipital lobe since June suggesting new or progressed cerebral edema at that site. 2. No acute intracranial hemorrhage or new cortically based infarct identified  01-22-15: chest x-ray; No active disease.  Chronic elevation of the right hemidiaphragm.   01-22-15: mri of brain: 1. Slight interval increase in size of heterogeneously enhancing left occipital lobe lesion now measuring 19 x 16 x 15 mm with slightly increased vasogenic edema. Given the relative increase in size of this lesion, this is felt to be most consistent with tumor given its progression relative to previous MRI from 11/06/2014. 2. 10 mm right periventricular enhancing nodule, not significantly changed relative to previous MRI from 12/08/2014, but increased in size relative to 11/06/2014. No associated edema. 3. Stable postoperative changes from previous right parietal lobe tumor resection. 4. Remote right MCA territory infarct surrounding the resection cavity, stable.     LABS REVIEWED:   03-14-14: wbc 5.0; hgb 10.6; hct 34.2;  mcv 83.5; plt 130; glucose 83; bun 27; creat 1.48; k+4.4; na++145; ca++ 10.6; chol 196; ldl 122; trig 129; hgb a1c 5.0 07-24-14: hgb a1c 5.3  10-09-14: wbc 3.9; hgb 9.6; hct 31.3; mcv 82.7; plt 123; glucose 84; bun 27.2; creat 1.54; k+3.7; na++141; liver normal albumin 3.5  01-12-15:chol 153; ldl 79; trig 62; hdl 62 01-19-15: wbc 4.4; hgb 10.1; hct 33.0; mcv 83.2; plt 123; glucose 91; bun 20.3; creat 1.10; k+2.9; na++147; liver normal albumin 3.7 01-21-15: glucose 98; bun 17; creat 1.17; k+2.8; na++148; liver normal albumin 3.5 01-22-15: wbc 5.5; hgb 11.0; hct 33.9; mcv 82.3; plt 120; glucose 98; bun 19; creat  1.30; k+3.4; na++152; liver normal albumin 3.6; mag 1.8; phos 1.8 01-23-15: wbc 3.0; hgb 10.0; hct 31.9; mcv 83.5; plt 117; glucose 144; bun 20; creat 1.27; k+3.2; na++150; vit b12: 514; ammonia 46; HIV: NR 01-27-15: wbc 4.2; hgb 10.5; hct 32.3; mcv 82.3; plt 92; glucose 147; bun 28; creat 1.34; k+3.5; na++136 01-29-15: wbc 10.1; hgb 10.7; hct 32.5; mcv 81.0; plt 60; glucose 122; bun 42; creat 1.65; k+3.4; na++137   Review of Systems  Unable to perform ROS: Patient unresponsive      Physical Exam  Constitutional: No distress.  Eyes: Conjunctivae are normal.  Able to pull up eyelids without resistance   Neck: Neck supple. No JVD present. No thyromegaly present.  Cardiovascular: Normal rate, regular rhythm and intact distal pulses.   Respiratory: Effort normal and breath sounds normal. No respiratory distress. She has no wheezes.  GI: Soft. Bowel sounds are normal. She exhibits no distension. There is no tenderness.  Musculoskeletal: She exhibits no edema.  Has left hemiparesis present Does not voluntarily move any extremity    Lymphadenopathy:    She has no cervical adenopathy.  Neurological:  Is not responsive to stimuli; does moan and grunt   Skin: Skin is warm. She is diaphoretic.  Mildly diaphoretic        ASSESSMENT/ PLAN:  1. Astrocytoma brain tumor/metastatic brain cancer:   left occipital lobe lesion  measuring 19 x 16 x 15 mm with slightly increased vasogenic edema has increased in size. Appears to be in pain will begin roxanol 5 mg every 6 hours routinely and every 2 hours as needed. Has a low grade temp will begin tylenol 650 mg rectal every 6 hours routinely will monitor her status.    2. Dyslipidemia: will stop the lipitor   3. Status post cva with left hemiparesis: will stop the neurontin and baclofen will continue to monitor her status.   4. Jerrye Bushy: will stop the prilosec at this time will monitor  5. Seizures: she has not been able to take her medications at this time; will stop the keppra and vimpat will begin ativan concentrate 1 mg every 6 hours and for breakthrough seizure will begin ativan 1 mg every 15 minutes up to 6 doses and will monitor her status.   6. Psychosis: she is not verbally responsive at this time; will stop the abilify and lamictal at this time will monitor her status.   7. Depression: she is not aware of her surroundings; will stop the prozac and will monitor   8. CKD: stage III: her creat is 1.65; is without significant change; she is unable to take adequate fluids   9. Thrombocytopenia: her plt count is 60 no signs of bleeding present   10. FTT: will continue to focus her care upon comfort and will continue to monitor her status.     Time spent with patient  120  minutes >50% time spent counseling; reviewing medical record; tests; labs; and developing future plan of care      Ok Edwards NP [facility]  Contact [phone] Monday through Friday 8am- 5pm  After hours call [phone]

## 2015-01-30 NOTE — Progress Notes (Signed)
Patient ID: [name], female   DOB: 1960-11-29, 54 y.o.   MRN: [id number]    Facility: [facility]      Allergies  Allergen Reactions  . Vasotec Other (See Comments)    Causes angioedema    Chief Complaint  Patient presents with  . Medical Management of Chronic Issues    HPI:  She is a long term resident of this facility being seen for the management of her chronic illnesses.  Therapy staff report that she continues to have increased spasticity present; she is on baclofen as needed. This is not effective for her spasticity. She does tell me that her arm hurts. She is unable to fully participate in the hpi or ros.     Past Medical History  Diagnosis Date  . 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 Fracture, ankle 2012    right  . History of radiation therapy 09/23/10- 11/07/10    right parietal lobe  . Chronic headaches     uses oxycodone appropriately  . Anxiety   . Muscle spasticity   . Seizures   . Dysphagia   . GERD (gastroesophageal reflux disease)   . Nontraumatic subarachnoid hemorrhage   . Pure hypercholesterolemia   . Mild mood disorder   . Atypical psychosis   . Epilepsy   . Problems with swallowing and mastication   . Depression     Past Surgical History  Procedure Laterality Date  . Orif ankle fracture  08/22/2010    right  . Radiology with anesthesia N/A 05/02/2013    Procedure: RADIOLOGY WITH ANESTHESIA;  Surgeon: Carla Lose, MD;  Location: [place];  Service: Radiology;  Laterality: N/A;  . Brain surgery Right 2012    astrocytoma R parietal lobe    VITAL SIGNS BP 119/79 mmHg  Pulse 60  Ht 5\' 3"  (1.6 m)  Wt 148 lb (67.132 kg)  BMI 26.22 kg/m2  LMP 08/26/2010  Patient's Medications   ACETAMINOPHEN (TYLENOL) 325 MG TABLET    Take 650 mg by mouth 3 (three) times daily. For pain   ARIPIPRAZOLE (ABILIFY) 5 MG TABLET    Take 7 mg by mouth daily. For psychosis   ASPIRIN 325 MG EC TABLET    Take 325 mg by  mouth daily. For cerebral aneurysm   ATORVASTATIN (LIPITOR) 10 MG TABLET    Take 10 mg by mouth daily at 6 PM. For lipoproteins   BACLOFEN (LIORESAL) 10 MG TABLET    Take 1 tablet (10 mg total) by mouth 3 (three) times daily as needed for muscle spasms.   BISACODYL (DULCOLAX) 10 MG SUPPOSITORY    Place 1 suppository (10 mg total) rectally daily as needed for moderate constipation.   FLUOXETINE (PROZAC) 20 MG CAPSULE    Take 60 mg by mouth daily.   GABAPENTIN (NEURONTIN) 300 MG CAPSULE    Take 1 capsule (300 mg total) by mouth at bedtime.   LACOSAMIDE (VIMPAT) 50 MG TABS TABLET    Take one tablet by mouth twice daily   LAMOTRIGINE (LAMICTAL) 100 MG TABLET    Take 1 tablet (100 mg total) by mouth daily. For mood disorder   LEVETIRACETAM (KEPPRA) 500 MG TABLET    Take 1 tablet (500 mg total) by mouth every 12 (twelve) hours.   MAGNESIUM HYDROXIDE (MILK OF MAGNESIA) 400 MG/5ML SUSPENSION    Take by mouth daily as needed for mild constipation.   MULTIPLE VITAMIN (MULTIVITAMIN) TABLET    Take  1 tablet by mouth daily.     OMEPRAZOLE (PRILOSEC) 20 MG CAPSULE    Take 20 mg by mouth daily.   POLYETHYLENE GLYCOL (MIRALAX / GLYCOLAX) PACKET    Take 17 g by mouth daily.    New Prescriptions   No medications on file  Previous Medications  Modified Medications   No medications on file  Discontinued Medications   No medications on file     SIGNIFICANT DIAGNOSTIC EXAMS  07-28-14: mri of brain: 1. Stable postoperative changes the right parietal lobe with a persistent hematocrit level in some enhancement along the superior margin of the surgical cavity without significant interval change. The areas of enhancement correspond to the infarct of 2014. There is persistent enhancement along the more inferior areas of the right MCA territory infarct is well. 2. 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 3. Diffuse dural enhancement over the right  convexity is stable. 4. Significant signal loss in the right hemisphere is unchanged.   08-04-14: mammogram: No mammographic evidence of malignancy. A result letter of this screening mammogram will be mailed directly to the patient. RECOMMENDATION: Screening mammogram in one year. (Code:SM-B-01Y)      LABS REVIEWED:  03-14-14: wbc 5.0; hgb 10.6; hct 34.2; mcv 83.5; plt 130; glucose 83; bun 27; creat 1.48; k+4.4; na++145; ca++ 10.6; chol 196; ldl 122; trig 129; hgb a1c 5.0 07-24-14: hgb a1c 5.3  10-09-14: wbc 3.9; hgb 9.6; hct 31.3; mcv 82.7; plt 123; glucose 84; bun 27.2; creat 1.54; k+3.7; na++141; liver normal albumin 3.5     Review of Systems Unable to perform ROS: Other    Physical Exam Constitutional: She appears well-developed and well-nourished. No distress.  Eyes: Conjunctivae are normal.  Neck: Neck supple. No JVD present. No thyromegaly present.  Cardiovascular: Normal rate, regular rhythm and intact distal pulses.   Respiratory: Effort normal and breath sounds normal. No respiratory distress. She has no wheezes.  GI: Soft. Bowel sounds are normal. She exhibits no distension. There is no tenderness.  Musculoskeletal: She exhibits no edema.  Is able to move right extremities Has left side hemiparesis; flaccid Left arm in splint is spastic Has left side facial drooping   Lymphadenopathy:    She has no cervical adenopathy.  Neurological: She is alert.  Skin: Skin is warm and dry. She is not diaphoretic.  Psychiatric: She has a normal mood and affect.      ASSESSMENT/ PLAN:  1. Seizure; no reports of seizure activity present; will continue keppra 500 mg twice daily and vimpat 50 mg twice daily will monitor   2. Left side spastic  Hemiparesis status post cva: is without change will continue asa 325 mg daily; will increase her neurontin to 300 mg nightly for pain management she continues with left arm spasticity will change baclofen to 10 mg three times daily routinely     3. Psychosis: no reports of behavioral issues present;  on abilify 7 mg daily; is on lamictal 100 mg daily to stabilize mood;   4. Depression with anxiety: she does continue to receive benefit from prozac 60 mg daily  will not make changes will monitor her status.   5. Dyslipidemia: her ldl is 122; will continue lipitor 10 mg daily   6. Astrocytoma brain tumor: is status post radiation therapy; is without change in status;is followed by neurology  will monitor  7. CKD stage III: no change in status; creat is 1.48; will monitor  8. GERD: will continue prilosec 20 mg daily    Ok Edwards NP [facility]  Contact [phone] Monday through Friday 8am- 5pm  After hours call [phone]

## 2015-01-31 ENCOUNTER — Ambulatory Visit: Payer: Self-pay | Admitting: Radiation Oncology

## 2015-01-31 ENCOUNTER — Encounter: Payer: Self-pay | Admitting: Adult Health

## 2015-01-31 ENCOUNTER — Non-Acute Institutional Stay (SKILLED_NURSING_FACILITY): Payer: Medicare Other | Admitting: Adult Health

## 2015-01-31 DIAGNOSIS — R627 Adult failure to thrive: Secondary | ICD-10-CM | POA: Insufficient documentation

## 2015-01-31 DIAGNOSIS — C719 Malignant neoplasm of brain, unspecified: Secondary | ICD-10-CM

## 2015-01-31 NOTE — Progress Notes (Signed)
Patient ID: [name], female   DOB: 12/16/1960, 54 y.o.   MRN: [id number]   Facility: [facility]      Allergies  Allergen Reactions  . Vasotec Other (See Comments)    Causes angioedema    Chief Complaint  Patient presents with  . Acute Visit    patient status     HPI:  I have spoken with her father regarding her overall status. His desire is for her to be kept comfortable. He does not want aggressive care for her. We discussed treatments that could be available for her. He does not want antibiotic therapy; ivf and does not want her returning to the hospital.  She is more aware today and is awake. She is not able to participate in the hpi or ros; but did indicate that she is in pain.    Past Medical History  Diagnosis Date  . 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 Fracture, ankle 2012    right  . History of radiation therapy 09/23/10- 11/07/10    right parietal lobe  . Chronic headaches     uses oxycodone appropriately  . Anxiety   . Muscle spasticity   . Seizures   . Dysphagia   . GERD (gastroesophageal reflux disease)   . Nontraumatic subarachnoid hemorrhage   . Pure hypercholesterolemia   . Mild mood disorder   . Atypical psychosis   . Epilepsy   . Problems with swallowing and mastication   . Depression     Past Surgical History  Procedure Laterality Date  . Orif ankle fracture  08/22/2010    right  . Radiology with anesthesia N/A 05/02/2013    Procedure: RADIOLOGY WITH ANESTHESIA;  Surgeon: Consuella Lose, MD;  Location: [place];  Service: Radiology;  Laterality: N/A;  . Brain surgery Right 2012    astrocytoma R parietal lobe    VITAL SIGNS BP 120/60 mmHg  Pulse 68  Ht 5\' 3"  (1.6 m)  Wt 144 lb (65.318 kg)  BMI 25.51 kg/m2  SpO2 95%  LMP 08/26/2010  Patient's Medications  New Prescriptions   No medications on file  Previous Medications   ALBUTEROL (PROVENTIL) (2.5 MG/3ML) 0.083% NEBULIZER SOLUTION    Take 3  mLs (2.5 mg total) by nebulization every 4 (four) hours as needed for wheezing.   DEXAMETHASONE (DECADRON) 1 MG/ML SOLUTION    Take 4 mg by mouth 3 (three) times daily.   LORAZEPAM (ATIVAN) 2 MG/ML CONCENTRATED SOLUTION    Take 0.5 mLs (1 mg total) by mouth every 6 (six) hours. AND for breakthrough seizure give 1 mg every 15 minutes for up to 6 doses   MORPHINE SULFATE (MORPHINE CONCENTRATE) 10 MG / 0.5 ML CONCENTRATED SOLUTION    Take 0.25 mLs (5 mg total) by mouth every 6 (six) hours. AND every 2 hours as needed   POLYETHYLENE GLYCOL (MIRALAX / GLYCOLAX) PACKET    Take 17 g by mouth daily.   PRESCRIPTION MEDICATION    Take 60 mLs by mouth 2 (two) times daily. 2 cal supplement (8am and 5pm)  Modified Medications   No medications on file  Discontinued Medications     SIGNIFICANT DIAGNOSTIC EXAMS 07-28-14: mri of brain: 1. Stable postoperative changes the right parietal lobe with a persistent hematocrit level in some enhancement along the superior margin of the surgical cavity without significant interval change. The areas of enhancement correspond to the infarct of 2014. There is persistent enhancement along the  more inferior areas of the right MCA territory infarct is well. 2. 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 3. Diffuse dural enhancement over the right convexity is stable. 4. Significant signal loss in the right hemisphere is unchanged.   08-04-14: mammogram: No mammographic evidence of malignancy. A result letter of this screening mammogram will be mailed directly to the patient. RECOMMENDATION: Screening mammogram in one year. (Code:SM-B-01Y)  01-22-15: ct of head: 1. Chronic and post treatment changes to the brain. Increased hypodensity in the left occipital lobe since June suggesting new or progressed cerebral edema at that site. 2. No acute intracranial hemorrhage or new cortically based infarct  identified  01-22-15: chest x-ray; No active disease.  Chronic elevation of the right hemidiaphragm.   01-22-15: mri of brain: 1. Slight interval increase in size of heterogeneously enhancing left occipital lobe lesion now measuring 19 x 16 x 15 mm with slightly increased vasogenic edema. Given the relative increase in size of this lesion, this is felt to be most consistent with tumor given its progression relative to previous MRI from 11/06/2014. 2. 10 mm right periventricular enhancing nodule, not significantly changed relative to previous MRI from 12/08/2014, but increased in size relative to 11/06/2014. No associated edema. 3. Stable postoperative changes from previous right parietal lobe tumor resection. 4. Remote right MCA territory infarct surrounding the resection cavity, stable.     LABS REVIEWED:   03-14-14: wbc 5.0; hgb 10.6; hct 34.2; mcv 83.5; plt 130; glucose 83; bun 27; creat 1.48; k+4.4; na++145; ca++ 10.6; chol 196; ldl 122; trig 129; hgb a1c 5.0 07-24-14: hgb a1c 5.3  10-09-14: wbc 3.9; hgb 9.6; hct 31.3; mcv 82.7; plt 123; glucose 84; bun 27.2; creat 1.54; k+3.7; na++141; liver normal albumin 3.5  01-12-15:chol 153; ldl 79; trig 62; hdl 62 01-19-15: wbc 4.4; hgb 10.1; hct 33.0; mcv 83.2; plt 123; glucose 91; bun 20.3; creat 1.10; k+2.9; na++147; liver normal albumin 3.7 01-21-15: glucose 98; bun 17; creat 1.17; k+2.8; na++148; liver normal albumin 3.5 01-22-15: wbc 5.5; hgb 11.0; hct 33.9; mcv 82.3; plt 120; glucose 98; bun 19; creat 1.30; k+3.4; na++152; liver normal albumin 3.6; mag 1.8; phos 1.8 01-23-15: wbc 3.0; hgb 10.0; hct 31.9; mcv 83.5; plt 117; glucose 144; bun 20; creat 1.27; k+3.2; na++150; vit b12: 514; ammonia 46; HIV: NR 01-27-15: wbc 4.2; hgb 10.5; hct 32.3; mcv 82.3; plt 92; glucose 147; bun 28; creat 1.34; k+3.5; na++136 01-29-15: wbc 10.1; hgb 10.7; hct 32.5; mcv 81.0; plt 60; glucose 122; bun 42; creat 1.65; k+3.4; na++137      Review of Systems  Unable to  perform ROS: Patient nonverbal      Physical Exam Constitutional: No distress.  Eyes: Conjunctivae are normal.    Neck: Neck supple. No JVD present. No thyromegaly present.  Cardiovascular: Normal rate, regular rhythm and intact distal pulses.   Respiratory: Effort normal and breath sounds normal. No respiratory distress. She has no wheezes.  GI: Soft. Bowel sounds are normal. She exhibits no distension. There is no tenderness.  Musculoskeletal: She exhibits no edema.  Has left hemiparesis present Does not voluntarily move any extremity    Lymphadenopathy:    She has no cervical adenopathy.  Neurological: is responsive  Skin: Skin is warm and dry     ASSESSMENT/ PLAN:  Astrocytoma brain tumor FTT  Her MOST form has been filled out. The focus of her care will be for comfort only. Will setup a  hospice care consult. I have also informed her husband of this plan and he is in agreement with this plan.    Time spent with patient  45   minutes >50% time spent counseling; reviewing medical record; tests; labs; and developing future plan of care   Ok Edwards NP [facility]  Contact [phone] Monday through Friday 8am- 5pm  After hours call [phone]

## 2015-02-08 ENCOUNTER — Ambulatory Visit (HOSPITAL_COMMUNITY): Payer: PRIVATE HEALTH INSURANCE

## 2015-02-12 ENCOUNTER — Ambulatory Visit: Payer: Self-pay | Admitting: Radiation Oncology

## 2015-03-03 DEATH — deceased

## 2015-05-07 NOTE — Progress Notes (Signed)
Patient ID: [name], female   DOB: July 28, 1960, 54 y.o.   MRN: [id number]    HISTORY AND PHYSICAL   DATE: 11/20/14  Location:  [facility]    Place of Service: [id number] [phone])   Extended Emergency Contact Information Primary Emergency Contact: Kazmierczak,Tim Address: [address]          [place], [place] [address] [place] Phone: [phone] Mobile Phone: [phone] Relation: Spouse Secondary Emergency Contact: Kimberlee N[place] Mobile Phone: [phone] Relation: Father  Advanced Directive information  FULL CODE  Chief Complaint  Patient presents with  . Readmit To SNF    HPI:  54 yo female long term resident seen today as a readmission into SNF following hospital stay for acute encephalopathy, E coli UTI, new brain lesion with hx astrocytoma, hx SAH/aneurysm wih left spastic hemiparesis, A/CKD stage 3, depression and sz d/o. EEG was neg for epilepsy. MRI brain revealed a new 11 x 9 x 11 mm rim enhancing lesion in medial left occipital lobe. She was placed on decadron. UA revealed E coli UTI and she was given 6 days of IV rocephin. She was d/cd to SNF with instructions to f/u with Dr [name] for tx options  She has no c/o today. No nursing issues  She takes abilify, prozac and vimpat for her mood.  No seizures on lamictal and keppra.   Cholesterol controlled on lipitor. No myalgias  Acid reflux stable on omeprazole  Pain stable on baclofen and gabapentin  Past Medical History  Diagnosis Date  . 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 Fracture, ankle 2012    right  . History of radiation therapy 09/23/10- 11/07/10    right parietal lobe  . Chronic headaches     uses oxycodone appropriately  . Anxiety   . Muscle spasticity   . Seizures   . Dysphagia   . GERD (gastroesophageal reflux disease)   . Nontraumatic subarachnoid hemorrhage   . Pure hypercholesterolemia   . Mild mood disorder     . Atypical psychosis   . Epilepsy   . Problems with swallowing and mastication   . Depression     Past Surgical History  Procedure Laterality Date  . Orif ankle fracture  08/22/2010    right  . Radiology with anesthesia N/A 05/02/2013    Procedure: RADIOLOGY WITH ANESTHESIA;  Surgeon: Consuella Lose, MD;  Location: [place];  Service: Radiology;  Laterality: N/A;  . Brain surgery Right 2012    astrocytoma R parietal lobe    Patient Care Team: [name], DO as PCP - General (Internal Medicine) Gerlene Fee, NP as Nurse Practitioner (Nurse Practitioner)  Social History   Social History  . Marital Status: Married    Spouse Name: N/A  . Number of Children: 1  . Years of Education: N/A   Occupational History  .      unemployed   Social History Main Topics  . Smoking status: Never Smoker   . Smokeless tobacco: Never Used  . Alcohol Use: 0.0 oz/week    3-4 Cans of beer per week  . Drug Use: No  . Sexual Activity: No   Other Topics Concern  . Not on file   Social History Narrative     reports that she has never smoked. She has never used smokeless tobacco. She reports that she drinks alcohol. She reports that she does not use illicit drugs.  Family History  Problem Relation Age of Onset  . Breast cancer Mother    Family Status  Relation Status Death Age  . Mother Deceased     breast cancer & alzheimers  . Father Alive     Immunization History  Administered Date(s) Administered  . Influenza,inj,Quad PF,36+ Mos 05/26/2013  . Pneumococcal Polysaccharide-23 05/26/2013, 01/26/2015  . Tdap 08/30/2011    Allergies  Allergen Reactions  . Vasotec Other (See Comments)    Causes angioedema    Medications: Patient's Medications  New Prescriptions   No medications on file  Previous Medications   ALBUTEROL (PROVENTIL) (2.5 MG/3ML) 0.083% NEBULIZER SOLUTION    Take 3 mLs (2.5 mg total) by nebulization every 4 (four) hours as needed for wheezing.    DEXAMETHASONE (DECADRON) 1 MG/ML SOLUTION    Take 4 mg by mouth 3 (three) times daily.   LORAZEPAM (ATIVAN) 2 MG/ML CONCENTRATED SOLUTION    Take 0.5 mLs (1 mg total) by mouth every 6 (six) hours. AND for breakthrough seizure give 1 mg every 15 minutes for up to 6 doses   MORPHINE SULFATE (MORPHINE CONCENTRATE) 10 MG / 0.5 ML CONCENTRATED SOLUTION    Take 0.25 mLs (5 mg total) by mouth every 6 (six) hours. AND every 2 hours as needed   POLYETHYLENE GLYCOL (MIRALAX / GLYCOLAX) PACKET    Take 17 g by mouth daily.   PRESCRIPTION MEDICATION    Take 60 mLs by mouth 2 (two) times daily. 2 cal supplement (8am and 5pm)  Modified Medications   No medications on file  Discontinued Medications   No medications on file    Review of Systems  Unable to perform ROS: Psychiatric disorder    Filed Vitals:   11/20/14 2253  BP: 114/70  Pulse: 74  Temp: 98.2 F (36.8 C)  Weight: 155 lb (70.308 kg)  SpO2: 97%   Body mass index is 27.46 kg/(m^2).  Physical Exam  Constitutional: She appears well-developed.  Frail appearing in NAD. Sitting in w/c  HENT:  Mouth/Throat: Oropharynx is clear and moist. No oropharyngeal exudate.  Eyes: Pupils are equal, round, and reactive to light. No scleral icterus.  Neck: Neck supple. Carotid bruit is not present. No tracheal deviation present. No thyromegaly present.  Cardiovascular: Normal rate, regular rhythm, normal heart sounds and intact distal pulses.  Exam reveals no gallop and no friction rub.   No murmur heard. +1 pitting LE edema b/l. No calf TTP   Pulmonary/Chest: Effort normal and breath sounds normal. No stridor. No respiratory distress. She has no wheezes. She has no rales.  Abdominal: Soft. Bowel sounds are normal. She exhibits no distension and no mass. There is no hepatomegaly. There is no tenderness. There is no rebound and no guarding.  Musculoskeletal:  LUE contracture  Lymphadenopathy:    She has no cervical adenopathy.  Neurological: She is  alert.  B/l LE weakness; paraplegic  Skin: Skin is warm and dry. No rash noted.  Psychiatric: She has a normal mood and affect. Her behavior is normal.     Labs reviewed: Admission on 11/05/2014, Discharged on 11/16/2014  No results displayed because visit has over 200 results.  34moago (11/15/14) 53mogo (11/13/14) 52m62moo (11/11/14)       WBC 4.0 - 10.5 K/uL 5.7 5.7 3.6 (L)    RBC 3.87 - 5.11 MIL/uL 3.85 (L) 4.12 3.75 (L)    Hemoglobin 12.0 - 15.0 g/dL 9.9 (L) 10.7 (L) 9.7 (L)    HCT 36.0 -  46.0 % 30.2 (L) 33.0 (L) 29.9 (L)    MCV 78.0 - 100.0 fL 78.4 80.1 79.7    MCH 26.0 - 34.0 pg 25.7 (L) 26.0 25.9 (L)    MCHC 30.0 - 36.0 g/dL 32.8 32.4 32.4    RDW 11.5 - 15.5 % 15.4 15.3 15.0    Platelets 150 - 400 K/uL 124 (L) 117 (L)CM 98 (L)CM   Resulting Agency  [facility]      Specimen Collected: 11/15/14 11:26 AM Last Resulted: 11/15/14 11:52 AM        32moago (11/13/14) 563mogo (11/11/14) 34m74moo (11/09/14)      Sodium 135 - 145 mmol/L 137 143 140   Potassium 3.5 - 5.1 mmol/L 3.8 3.9 3.7   Chloride 101 - 111 mmol/L 112 (H) 116 (H) 115 (H)   CO2 22 - 32 mmol/L 19 (L) 18 (L) 15 (L)   Glucose, Bld 65 - 99 mg/dL 148 (H) 108 (H) 109 (H)   BUN 6 - 20 mg/dL 22 (H) 26 (H) 27 (H)   Creatinine, Ser 0.44 - 1.00 mg/dL 1.19 (H) 1.18 (H) 1.18 (H)   Calcium 8.9 - 10.3 mg/dL 9.6 9.9 9.7   GFR calc non Af Amer >60 mL/min 50 (L) 60 mL/min" class="rz_1b" style="cursor: pointer;" onmouseover='jscript: var varStyle="underline"; var bgColor="#[id number]"; this.style.backgroundColor=bgColor; var children=this.getElementsByTagName("div"); for(var child=0;child 51 (L) 60 mL/min" class="rz_1c" style="cursor: pointer;" onmouseover='jscript: var varStyle="underline"; var bgColor="#[id number]"; this.style.backgroundColor=bgColor; var children=this.getElementsByTagName("div"); for(var child=0;child 51 (L)   GFR calc Af Amer >60 mL/min 58 (L) 60 mL/min" class="rz_1b" style="cursor: pointer;"  onmouseover='jscript: var varStyle="underline"; var bgColor="#[id number]"; this.style.backgroundColor=bgColor; var children=this.getElementsByTagName("div"); for(var child=0;child 59 (L)CM 60 mL/min" class="rz_1c" style="cursor: pointer;" onmouseover='jscript: var varStyle="underline"; var bgColor="#[id number]"; this.style.backgroundColor=bgColor; var children=this.getElementsByTagName("div"); for(var child=0;child 59 (L)CM  Comments: (NOTE)  The eGFR has been calculated using the CKD EPI equation.  This calculation has not been validated in all clinical situations.  eGFR's persistently <60 mL/min signify possible Chronic Kidney  Disease.     Anion gap 5 - 15  6 9 10   Resulting Agency  [facility]     Specimen Collected: 11/13/14 3:13 AM Last Resulted: 11/13/14 4:10 AM        2mo52mo  65yr 73yr       Cholesterol 0 - 200 mg/dL 125 179    Triglycerides <150 mg/dL 40 66R    HDL >40 mg/dL 54 48R    Total CHOL/HDL Ratio RATIO 2.3     VLDL 0 - 40 mg/dL 8     LDL Cholesterol 0 - 99 mg/dL 63 118R   Comments:     Total Cholesterol/HDL:CHD Risk  Coronary Heart Disease Risk Table            Men  Women  1/2 Average Risk  3.4  3.3  Average Risk    5.0  4.4  2 X Average Risk  9.6  7.1  3 X Average Risk 23.4  11.0      Use the calculated Patient Ratio  above and the CHD Risk Table  to determine the patient's CHD Risk.      ATP III CLASSIFICATION (LDL):  <100   mg/dL  Optimal  100-129 mg/dL  Near or Above           Optimal  130-159 mg/dL  Borderline  160-189 mg/dL  High  >190   mg/dL  Very High             CLINICAL DATA: Initial evaluation for altered mental status. History  of prior right parietal brain tumor.  EXAM: MRI HEAD WITHOUT AND WITH CONTRAST  TECHNIQUE: Multiplanar, multiecho pulse sequences of the brain and surrounding structures were obtained without and with intravenous  contrast.  CONTRAST: 58m MULTIHANCE GADOBENATE DIMEGLUMINE 529 MG/ML IV SOLN  COMPARISON: Prior CT from 11/05/2014 as well as previous MRI from 07/28/2014.  FINDINGS: Study is moderately degraded by motion artifact.  The right parietal resection cavity is similar in size relative to previous study. Restricted diffusion at the superior aspect of the cavity continues to diminish. Layering hematocrit level within the resection cavity again noted. Irregular enhancement along the superior margin of the resection cavity is grossly stable. Additionally, 7 mm focus of nodular enhancement within the periventricular white matter adjacent to the dilated body of the right lateral ventricles also grossly similar (series 16, image 32).  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  Dural thickening with enhancement overlying the right cerebral convexity is similar. Extensive T2 changes throughout the right hemisphere are similar to previous study as well.  Ex vacuo dilatation of the right lateral ventricle again noted, stable. Slight left-to-right midline shift also unchanged. Mild periventricular white matter changes on the left are similar.  Normal intravascular flow voids are maintained. No acute infarct.  There is a new heterogeneous LTruman Haywardenhancing lesion within the medial left occipital lobe measuring 11 x 9 x 11 mm (AP by transverse by craniocaudad). There is localized vasogenic edema without significant mass effect.  Craniocervical junction within normal limits. Pituitary gland normal.  No acute abnormality about the orbits.  Mild mucosal thickening present within the max O sinuses and ethmoidal air cells.  Mastoid air cells are clear. Inner ear structures normal.  IMPRESSION: 1. New 11 x 9 x 11 mm rim enhancing lesion within the medial left occipital lobe with associated localized vasogenic edema. Finding is worrisome for  possible recurrent disease, although this would be somewhat unusual to reoccur in the contralateral cerebral hemisphere for a tumor that usually demonstrates local recurrence. Metastatic disease is also in the differential, although no other new lesions are identified. There is a 7 mm focus of nodular enhancement adjacent to the dilated body of the right lateral ventricle which is similar to previous exam. 2. Grossly stable postoperative changes within the right parietal lobe with persistent hematocrit level and some enhancement along the superior margin of the resection cavity. 3. Remote right MCA territory infarcts surrounding the resection cavity with associated laminar necrosis.   Electronically Signed  By: BJeannine Boga  CLINICAL DATA: New brain lesion. Evaluation for metastatic disease. Abdominal distention.  EXAM: CT CHEST, ABDOMEN, AND PELVIS WITH CONTRAST  TECHNIQUE: Multidetector CT imaging of the chest, abdomen and pelvis was performed following the standard protocol during bolus administration of intravenous contrast.  CONTRAST: 61mOMNIPAQUE IOHEXOL 300 MG/ML SOLN  COMPARISON: 08/15/2010  FINDINGS: CT CHEST FINDINGS  No enlarged axillary, mediastinal, or hilar lymph nodes are identified. Pulmonary arterial enlargement is similar to the prior study, with the main pulmonary artery measuring approximately 3.7 cm in diameter and suggestive of underlying pulmonary arterial hypertension. The heart is mildly enlarged. There is a small right pleural effusion.  Evaluation of the lung parenchyma is mildly limited by motion artifact. Opacities in the dependent portions of the lower greater than upper lobes bilaterally are favored to represent atelectasis. Major airways are patent. No lung mass or nodules are identified. No lytic or blastic osseous lesions are identified.  CT ABDOMEN AND PELVIS FINDINGS  Evaluation of the upper abdomen is  mildly limited by motion artifact. The liver, gallbladder, spleen, adrenal glands, and pancreas are grossly unremarkable. No renal mass or hydronephrosis is identified. A few punctate foci of hyperattenuation in the renal sinuses bilaterally may represent tiny nonobstructing calculi.  Oral contrast is present in multiple loops of nondilated small bowel without evidence of obstruction. There is a moderate amount of stool in the ascending and descending colon. There is mild gaseous distension of the transverse colon to approximately 5.5 cm in diameter, with mild gaseous distention of a portion of the sigmoid colon as well. There is a moderate amount of liquid stool in the distal sigmoid colon and rectum. No gross bowel wall thickening is identified.  Bladder is moderately distended. Uterus is identified. No pelvic mass is seen. Mild atherosclerotic vascular calcification is noted. No free fluid or enlarged lymph nodes are identified. No suspicious lytic or blastic osseous lesions are identified. Facet arthrosis is noted in the lower lumbar spine, severe on the right at L5-S1 with facet joint ankylosis.  IMPRESSION: 1. No evidence of malignancy in the chest, abdomen, or pelvis. 2. Trace right pleural effusion. 3. Dependent opacities in both lungs, favored to represent atelectasis. 4. No evidence of bowel obstruction. Moderate amount of colonic stool, including liquid stool in the distal colon and rectum. Mild gaseous distention of the transverse colon.   Electronically Signed  By: Logan Bores  On: 11/08/2014 17:07 CLINICAL DATA: New brain lesion. Evaluation for metastatic disease. Abdominal distention.  EXAM: CT CHEST, ABDOMEN, AND PELVIS WITH CONTRAST  TECHNIQUE: Multidetector CT imaging of the chest, abdomen and pelvis was performed following the standard protocol during bolus administration of intravenous contrast.  CONTRAST: 76m OMNIPAQUE IOHEXOL 300  MG/ML SOLN  COMPARISON: 08/15/2010  FINDINGS: CT CHEST FINDINGS  No enlarged axillary, mediastinal, or hilar lymph nodes are identified. Pulmonary arterial enlargement is similar to the prior study, with the main pulmonary artery measuring approximately 3.7 cm in diameter and suggestive of underlying pulmonary arterial hypertension. The heart is mildly enlarged. There is a small right pleural effusion.  Evaluation of the lung parenchyma is mildly limited by motion artifact. Opacities in the dependent portions of the lower greater than upper lobes bilaterally are favored to represent atelectasis. Major airways are patent. No lung mass or nodules are identified. No lytic or blastic osseous lesions are identified.  CT ABDOMEN AND PELVIS FINDINGS  Evaluation of the upper abdomen is mildly limited by motion artifact. The liver, gallbladder, spleen, adrenal glands, and pancreas are grossly unremarkable. No renal mass or hydronephrosis is identified. A few punctate foci of hyperattenuation in the renal sinuses bilaterally may represent tiny nonobstructing calculi.  Oral contrast is present in multiple loops of nondilated small bowel without evidence of obstruction. There is a moderate amount of stool in the ascending and descending colon. There is mild gaseous distension of the transverse colon to approximately 5.5 cm in diameter, with mild gaseous distention of a portion of the sigmoid colon as well. There is a moderate amount of liquid stool in the distal sigmoid colon and rectum. No gross bowel wall thickening is identified.  Bladder is moderately distended. Uterus is identified. No pelvic mass is seen. Mild atherosclerotic vascular calcification is noted. No free fluid or enlarged lymph nodes are identified. No suspicious lytic or blastic osseous lesions are identified. Facet arthrosis is noted in the lower lumbar spine, severe on the right at L5-S1 with facet joint  ankylosis.  IMPRESSION: 1. No evidence  of malignancy in the chest, abdomen, or pelvis. 2. Trace right pleural effusion. 3. Dependent opacities in both lungs, favored to represent atelectasis. 4. No evidence of bowel obstruction. Moderate amount of colonic stool, including liquid stool in the distal colon and rectum. Mild gaseous distention of the transverse colon.   Electronically Signed  By: Logan Bores  On: 11/08/2014 17:07  Assessment/Plan   ICD-9-CM ICD-10-CM   1. Brain lesion - new; rim enhancing 11x9x30m left medial occipital lobe 348.89 G93.9   2. Acute encephalopathy 348.30 G93.40   3. UTI (lower urinary tract infection) 599.0 N39.0    E coli  4. History of astrocytoma V10.85 Z85.841   5. History of hemorrhagic stroke with residual hemiparesis (HCC) 438.20 I69.959   6. Seizures (HCC) 780.39 R56.9   7. CKD (chronic kidney disease) stage 3, GFR 30-59 ml/min 585.3 N18.3   8. Depression with anxiety 300.4 F41.8   9. HLD (hyperlipidemia) 272.4 E78.5     F/u with Dr MTammi Klippelas scheduled  Cont current meds as ordered  PT/OT/ST as ordered  GOAL: long term care.  Prognosis is guarded. Communicated with pt and nursing.  Aaliayah Miao S. CPerlie Gold PUniversity Of Churchill Hospitalsand Adult Medicine [address] [phone] (Monday-Friday 8 AM - 5 PM) [phone] 5 PM and follow prompts

## 2016-03-10 IMAGING — CR DG ABDOMEN 1V
2 series · 2 of 2 positions shown · non-contrast
Comparison: CT 11/08/2014

CLINICAL DATA: 54-year-old female with a history of abdominal
bloating. No pain.

EXAM:
ABDOMEN - 1 VIEW

[abdomen kub (1 of 2)]
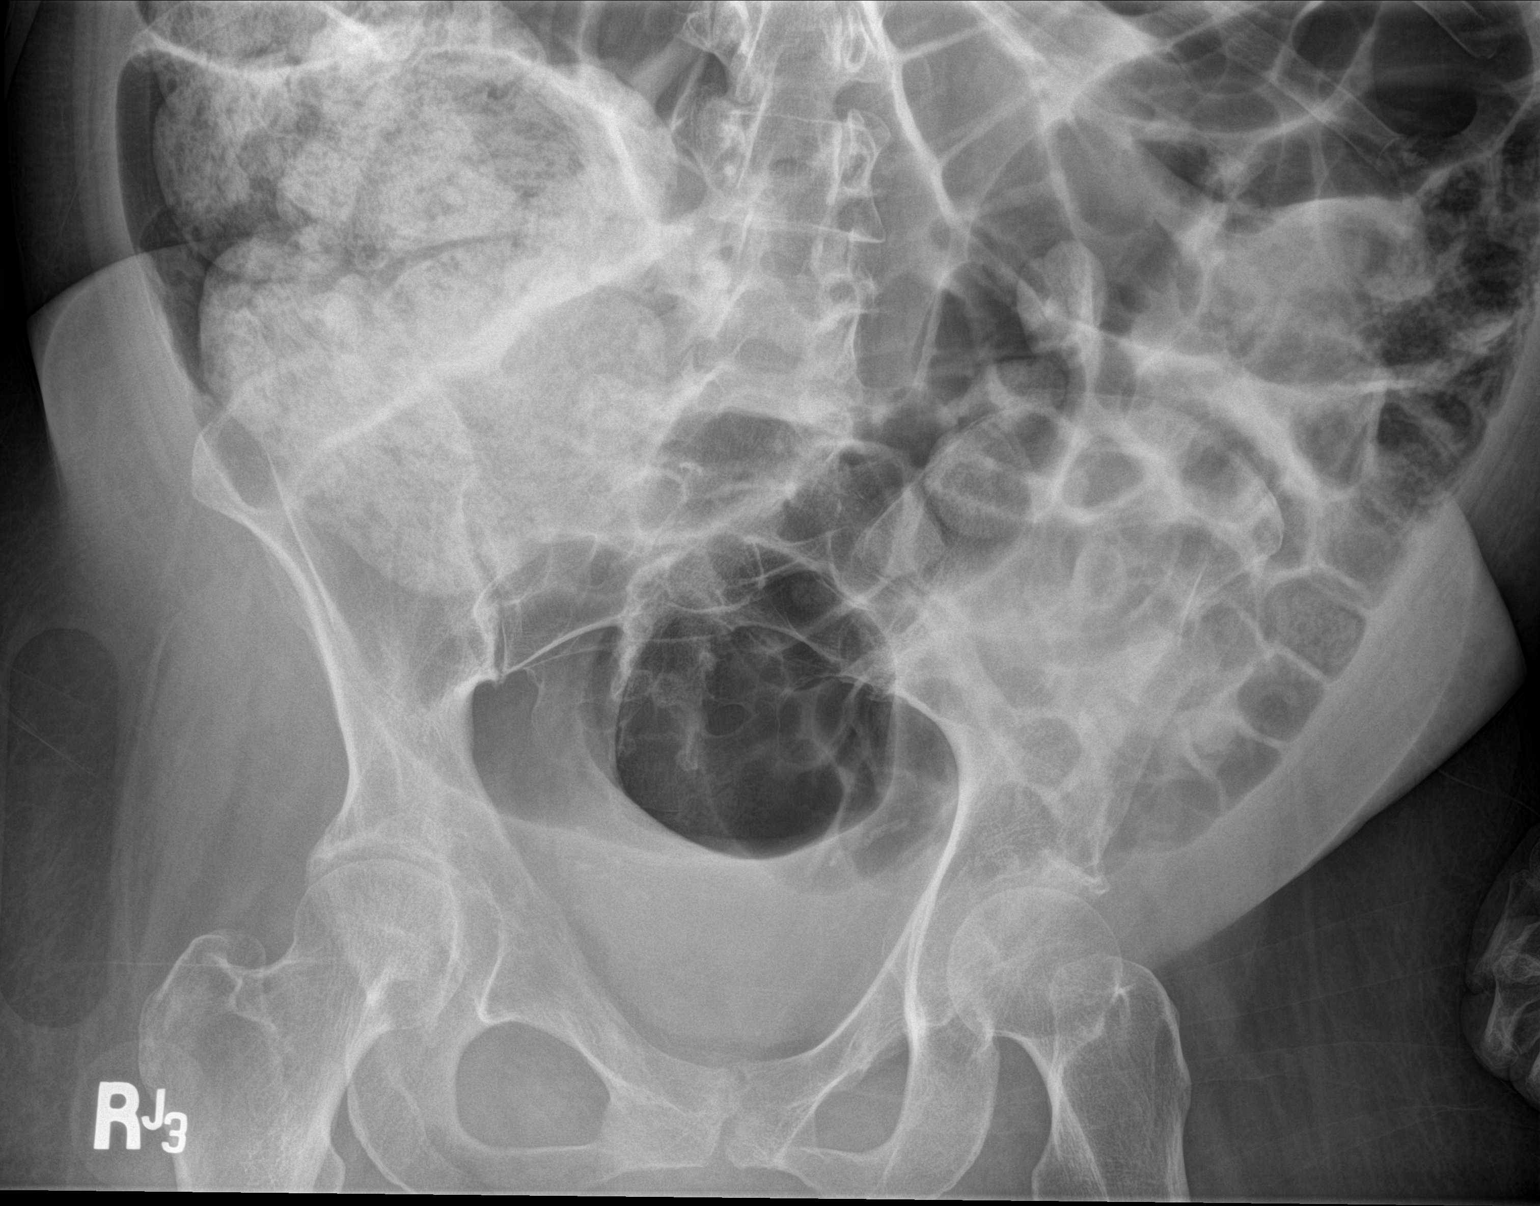

[abdomen kub (2 of 2)]
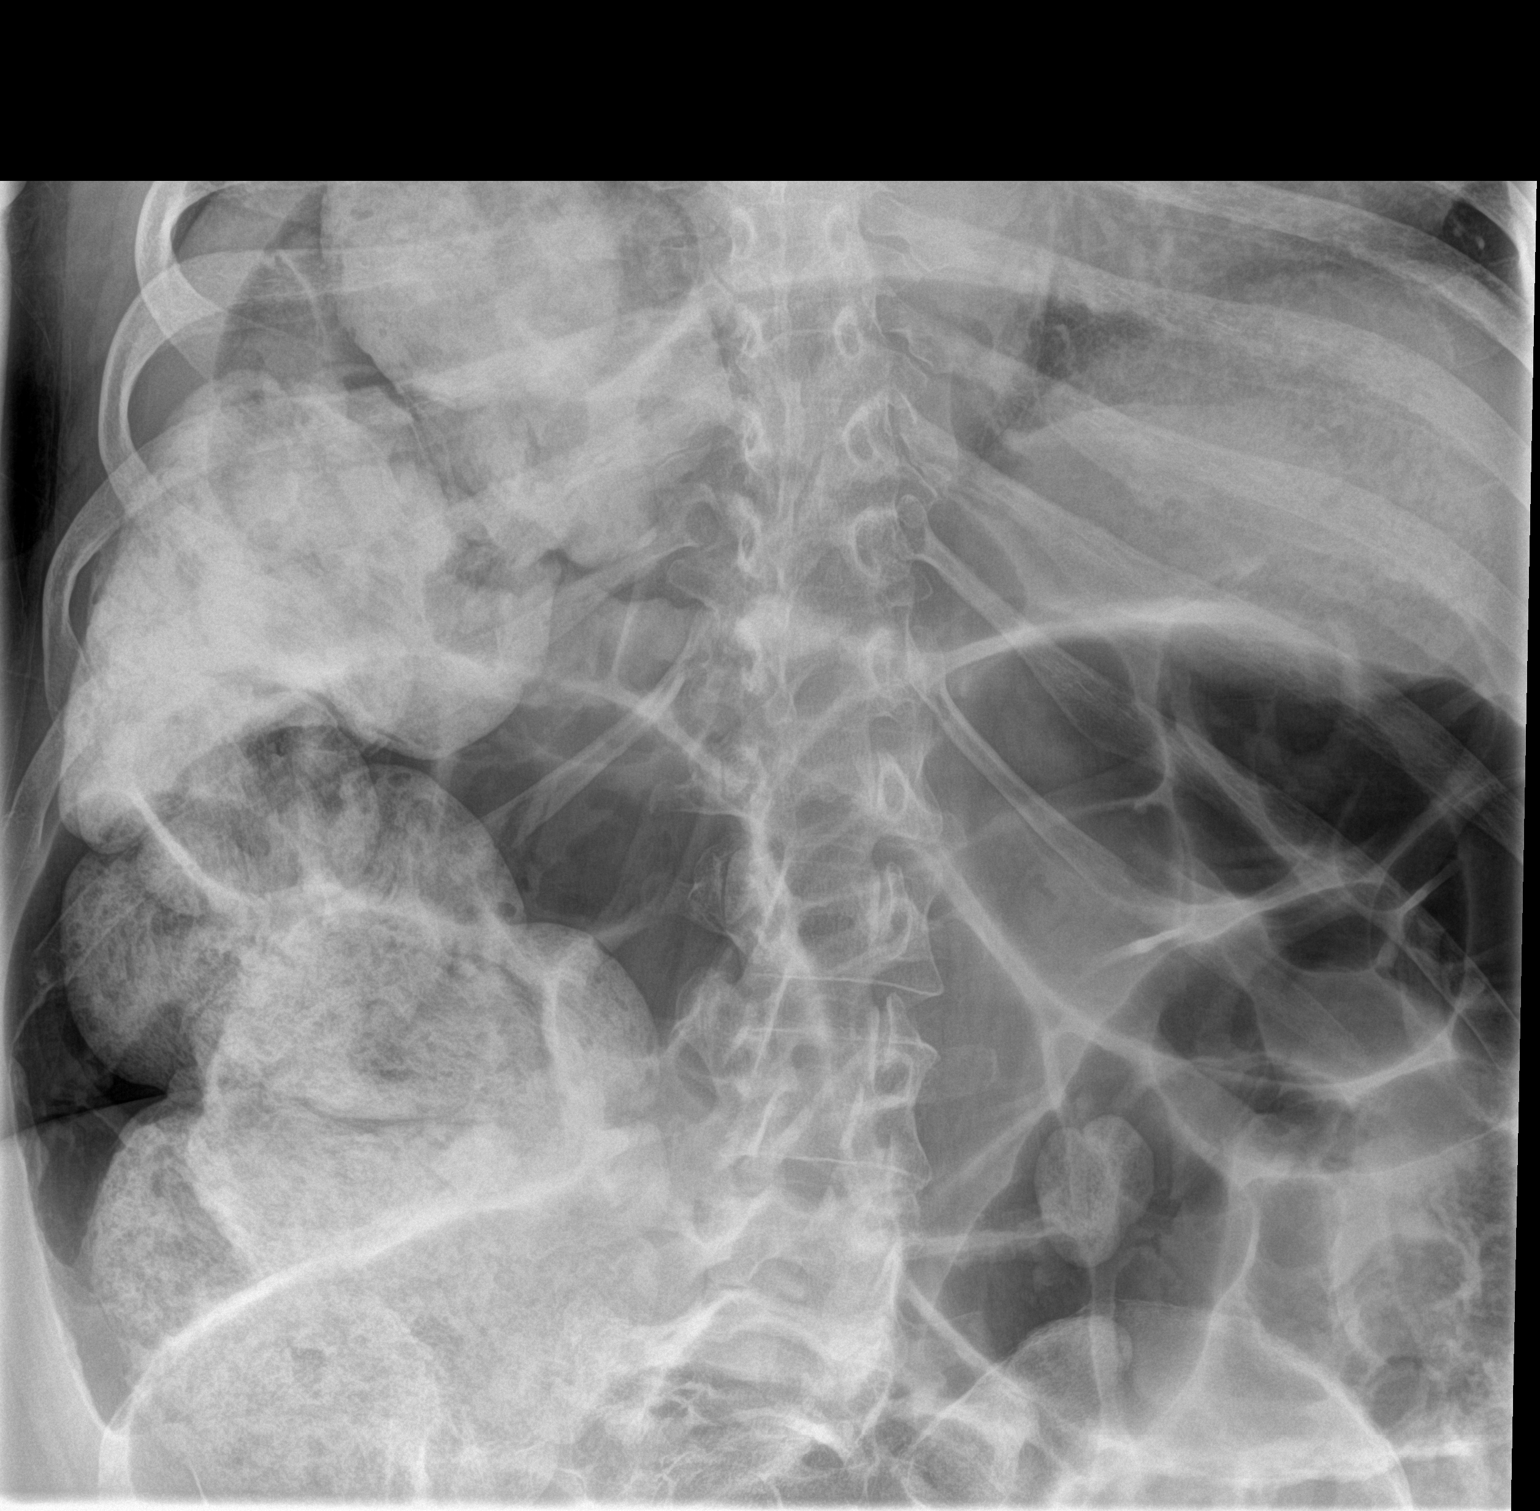

[2 of 2 positions shown; findings below may reference images not displayed]

FINDINGS: Gaseous distention of the distal colon, with significant degree of
formed stool in the right colon/ cecum. Gas extends to the rectum.
Compared to the prior plain film, there is decreased diameter of the
colon.

Multiple borderline dilated small bowel loops, similar to the
comparison study.

No significant retention of enteric contrast.

No definite evidence of free air.

Vascular calcifications.

No displaced fracture.

Mild scoliotic curvature of the visualized spine.
IMPRESSION: Borderline dilated small bowel and colon, favored to represent ileus
as gas extends to the rectum, with decreased caliber of colon from
the comparison plain film.

Similar appearance of moderate to large stool burden, involving the
right colon and transverse colon, may suggest constipation.

## 2016-03-11 IMAGING — CR DG ABDOMEN 1V
2 series · 2 of 2 positions shown · non-contrast
Comparison: 11/14/2014

CLINICAL DATA: Abdominal bloating.  Ileus.

EXAM:
ABDOMEN - 1 VIEW

[AP (1 of 2)]
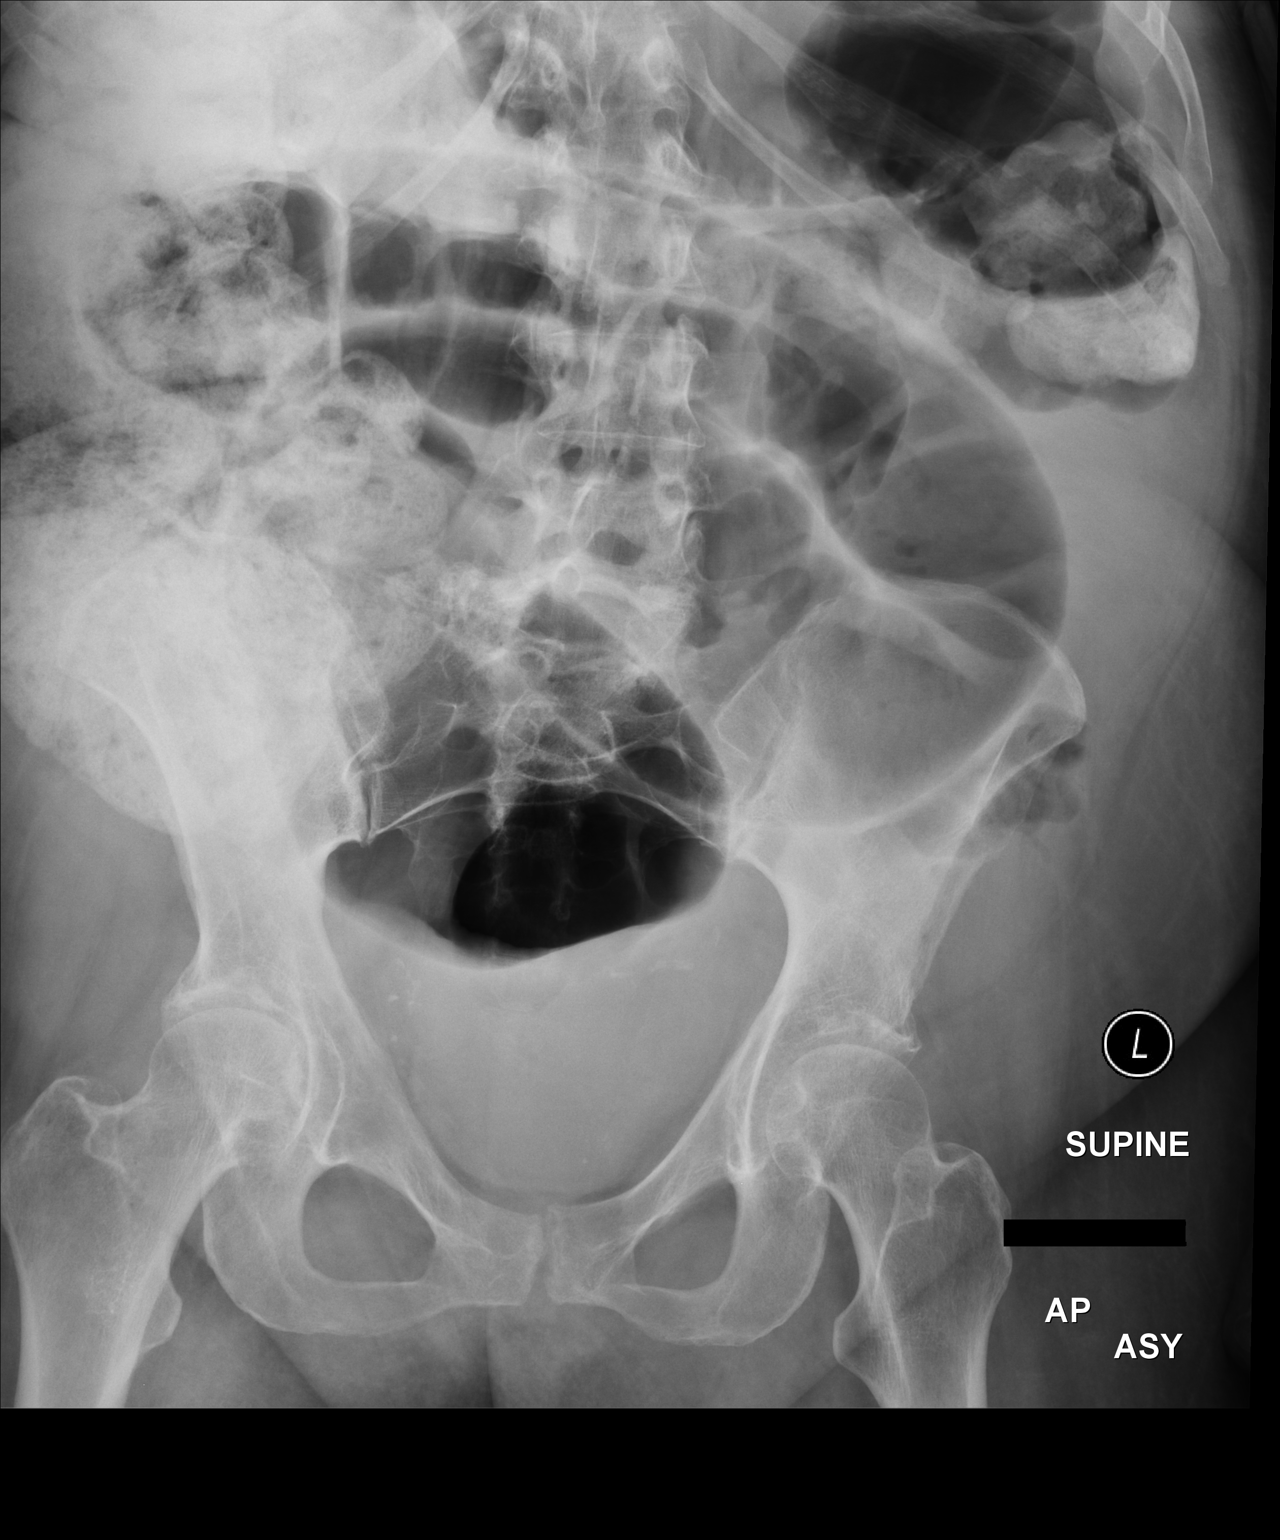

[AP (2 of 2)]
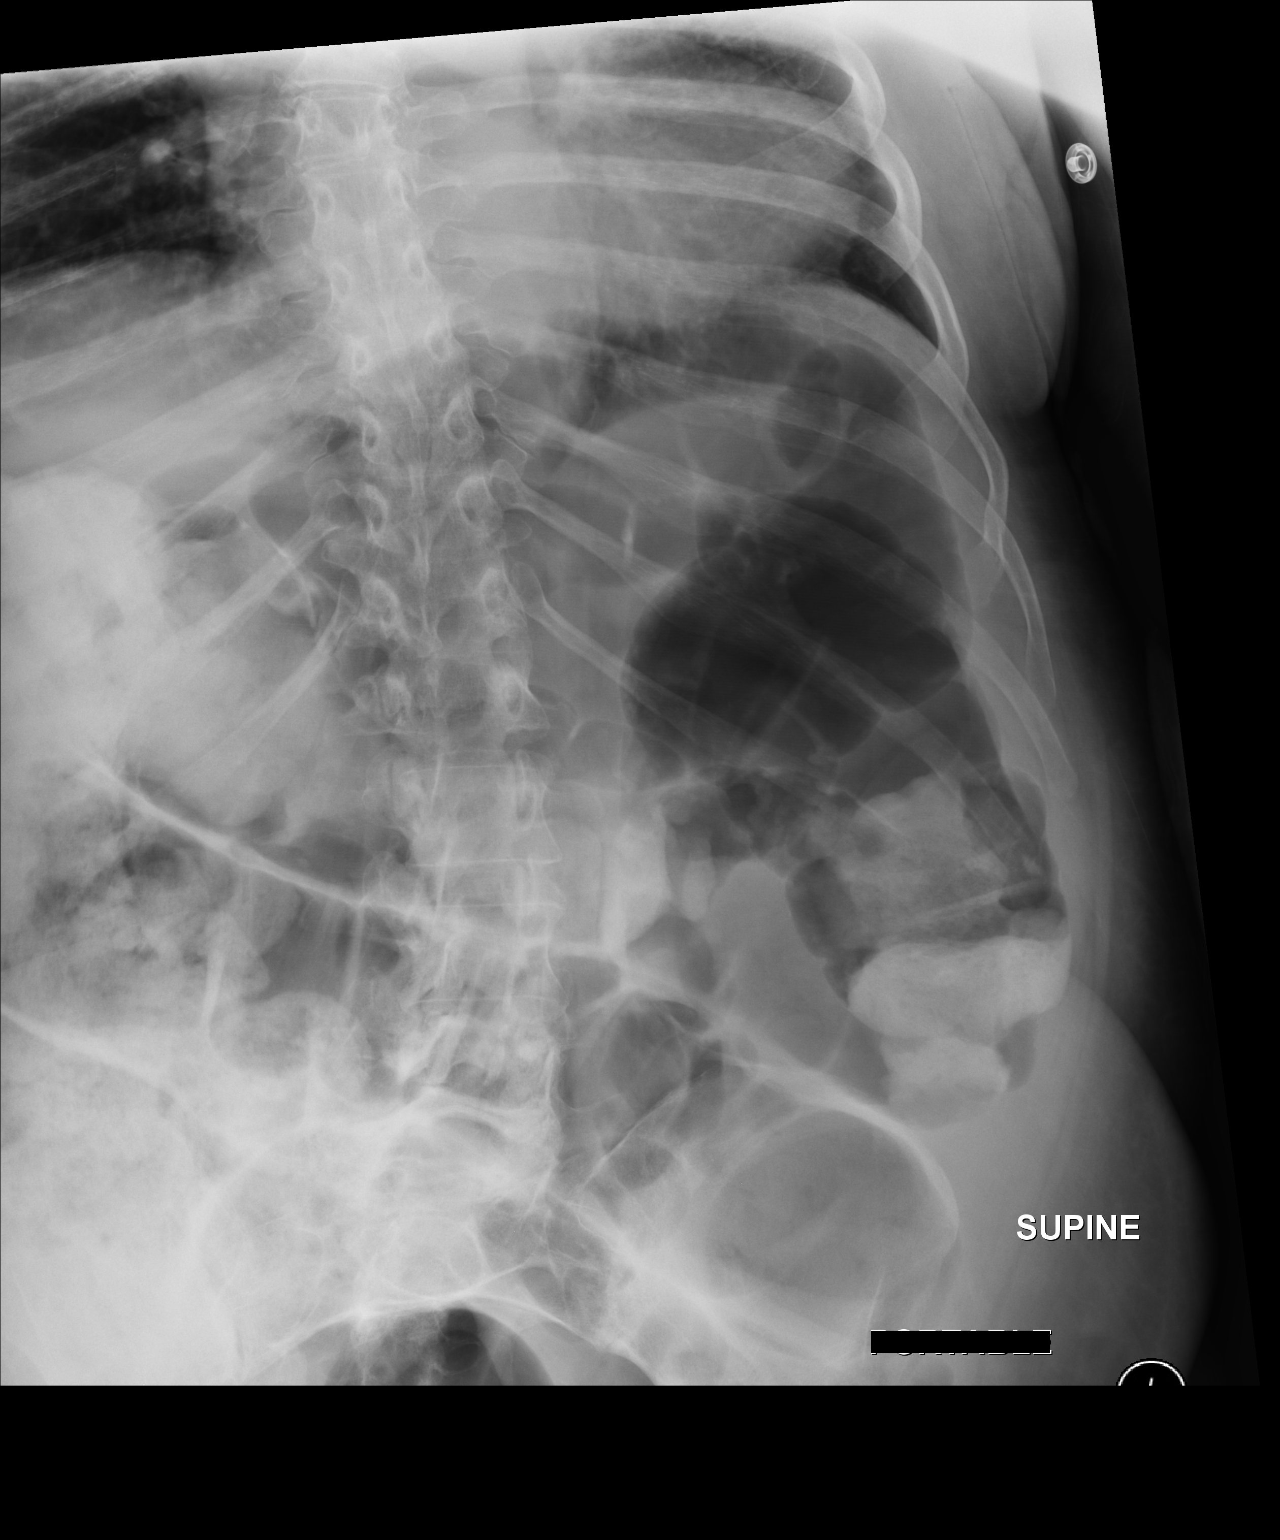

[2 of 2 positions shown; findings below may reference images not displayed]

FINDINGS: Diffuse dilatation of the colon shows mild improvement since
previous study. Contrast again seen in the ascending and transverse
portions. No gas is seen within the rectum on today's study. This
could be due to a ileus although a distal colonic obstruction cannot
definitely be excluded.
IMPRESSION: Mild improvement in diffuse colonic dilatation. This could represent
a colonic ileus, although distal colonic obstruction cannot
definitely be excluded. Recommend continued radiographic follow-up
versus abdomen pelvis CT with contrast for further evaluation.
